# Patient Record
Sex: Female | Born: 1940 | Race: Black or African American | Hispanic: No | Smoking: Never smoker
Health system: Southern US, Community
[De-identification: ages and names within clinical notes are randomized; demographics above are authoritative.]

## PROBLEM LIST (undated history)

## (undated) DIAGNOSIS — R011 Cardiac murmur, unspecified: Secondary | ICD-10-CM

## (undated) DIAGNOSIS — I5032 Chronic diastolic (congestive) heart failure: Secondary | ICD-10-CM

## (undated) DIAGNOSIS — G20A1 Parkinson's disease without dyskinesia, without mention of fluctuations: Secondary | ICD-10-CM

## (undated) DIAGNOSIS — I2510 Atherosclerotic heart disease of native coronary artery without angina pectoris: Secondary | ICD-10-CM

## (undated) DIAGNOSIS — E7800 Pure hypercholesterolemia, unspecified: Secondary | ICD-10-CM

## (undated) DIAGNOSIS — M1990 Unspecified osteoarthritis, unspecified site: Secondary | ICD-10-CM

## (undated) DIAGNOSIS — R7302 Impaired glucose tolerance (oral): Secondary | ICD-10-CM

## (undated) DIAGNOSIS — K2970 Gastritis, unspecified, without bleeding: Secondary | ICD-10-CM

## (undated) DIAGNOSIS — I10 Essential (primary) hypertension: Secondary | ICD-10-CM

## (undated) DIAGNOSIS — E669 Obesity, unspecified: Secondary | ICD-10-CM

## (undated) DIAGNOSIS — G20 Parkinson's disease: Secondary | ICD-10-CM

## (undated) DIAGNOSIS — I639 Cerebral infarction, unspecified: Secondary | ICD-10-CM

## (undated) DIAGNOSIS — I4891 Unspecified atrial fibrillation: Principal | ICD-10-CM

## (undated) DIAGNOSIS — Z90710 Acquired absence of both cervix and uterus: Secondary | ICD-10-CM

## (undated) DIAGNOSIS — F419 Anxiety disorder, unspecified: Secondary | ICD-10-CM

## (undated) HISTORY — PX: EYE SURGERY: SHX253

## (undated) HISTORY — PX: ABDOMINAL HYSTERECTOMY: SHX81

## (undated) HISTORY — DX: Anxiety disorder, unspecified: F41.9

## (undated) HISTORY — PX: TONSILLECTOMY: SHX5217

## (undated) HISTORY — DX: Impaired glucose tolerance (oral): R73.02

## (undated) HISTORY — DX: Chronic diastolic (congestive) heart failure: I50.32

## (undated) HISTORY — PX: CARPAL TUNNEL RELEASE: SHX101

## (undated) HISTORY — DX: Atherosclerotic heart disease of native coronary artery without angina pectoris: I25.10

## (undated) HISTORY — DX: Unspecified osteoarthritis, unspecified site: M19.90

## (undated) HISTORY — DX: Cardiac murmur, unspecified: R01.1

## (undated) HISTORY — DX: Essential (primary) hypertension: I10

## (undated) HISTORY — DX: Gastritis, unspecified, without bleeding: K29.70

## (undated) HISTORY — DX: Obesity, unspecified: E66.9

## (undated) HISTORY — DX: Pure hypercholesterolemia, unspecified: E78.00

## (undated) HISTORY — DX: Acquired absence of both cervix and uterus: Z90.710

## (undated) HISTORY — PX: REPLACEMENT TOTAL KNEE: SUR1224

---

## 2004-11-19 ENCOUNTER — Inpatient Hospital Stay (HOSPITAL_COMMUNITY): Admission: RE | Admit: 2004-11-19 | Discharge: 2004-11-22 | Payer: Self-pay | Admitting: Orthopedic Surgery

## 2004-11-19 ENCOUNTER — Ambulatory Visit: Payer: Self-pay | Admitting: Physical Medicine & Rehabilitation

## 2004-11-22 ENCOUNTER — Inpatient Hospital Stay
Admission: RE | Admit: 2004-11-22 | Discharge: 2004-11-30 | Payer: Self-pay | Admitting: Physical Medicine & Rehabilitation

## 2004-11-22 ENCOUNTER — Ambulatory Visit (HOSPITAL_COMMUNITY)
Admission: RE | Admit: 2004-11-22 | Discharge: 2004-11-22 | Payer: Self-pay | Admitting: Physical Medicine & Rehabilitation

## 2005-02-04 DIAGNOSIS — I639 Cerebral infarction, unspecified: Secondary | ICD-10-CM

## 2005-02-04 HISTORY — DX: Cerebral infarction, unspecified: I63.9

## 2005-02-27 ENCOUNTER — Ambulatory Visit: Payer: Self-pay | Admitting: *Deleted

## 2005-02-27 ENCOUNTER — Inpatient Hospital Stay (HOSPITAL_COMMUNITY): Admission: EM | Admit: 2005-02-27 | Discharge: 2005-03-05 | Payer: Self-pay | Admitting: Emergency Medicine

## 2005-02-28 ENCOUNTER — Encounter: Payer: Self-pay | Admitting: Cardiology

## 2005-03-08 ENCOUNTER — Ambulatory Visit: Payer: Self-pay

## 2005-03-12 ENCOUNTER — Ambulatory Visit: Payer: Self-pay | Admitting: Cardiology

## 2005-03-21 ENCOUNTER — Ambulatory Visit: Payer: Self-pay | Admitting: Cardiology

## 2005-05-03 ENCOUNTER — Ambulatory Visit: Payer: Self-pay | Admitting: Cardiology

## 2005-08-05 ENCOUNTER — Ambulatory Visit: Payer: Self-pay | Admitting: Cardiology

## 2005-08-23 ENCOUNTER — Ambulatory Visit: Payer: Self-pay

## 2005-09-09 ENCOUNTER — Ambulatory Visit: Payer: Self-pay | Admitting: Cardiology

## 2005-09-17 ENCOUNTER — Ambulatory Visit: Payer: Self-pay | Admitting: Cardiology

## 2005-10-10 ENCOUNTER — Ambulatory Visit: Payer: Self-pay | Admitting: Cardiology

## 2005-11-12 ENCOUNTER — Ambulatory Visit: Payer: Self-pay | Admitting: Cardiology

## 2005-11-13 ENCOUNTER — Ambulatory Visit: Payer: Self-pay

## 2005-11-18 ENCOUNTER — Ambulatory Visit: Payer: Self-pay | Admitting: Cardiology

## 2005-12-06 ENCOUNTER — Ambulatory Visit: Payer: Self-pay | Admitting: Cardiology

## 2006-01-16 ENCOUNTER — Ambulatory Visit: Payer: Self-pay | Admitting: Cardiology

## 2006-03-18 ENCOUNTER — Ambulatory Visit: Payer: Self-pay | Admitting: Cardiology

## 2006-03-18 LAB — CONVERTED CEMR LAB
ALT: 20 units/L (ref 0–40)
AST: 30 units/L (ref 0–37)
Albumin: 4.1 g/dL (ref 3.5–5.2)
Alkaline Phosphatase: 81 units/L (ref 39–117)
BUN: 22 mg/dL (ref 6–23)
CO2: 28 meq/L (ref 19–32)
Calcium: 9.8 mg/dL (ref 8.4–10.5)
Chloride: 102 meq/L (ref 96–112)
Cholesterol: 220 mg/dL (ref 0–200)
Creatinine, Ser: 1.1 mg/dL (ref 0.4–1.2)
Direct LDL: 90.9 mg/dL
GFR calc Af Amer: 64 mL/min
GFR calc non Af Amer: 52 mL/min
Glucose, Bld: 116 mg/dL — ABNORMAL HIGH (ref 70–99)
HDL: 41.7 mg/dL (ref >39.0)
Potassium: 4.4 meq/L (ref 3.5–5.1)
Sodium: 142 meq/L (ref 135–145)
Total Bilirubin: 1.1 mg/dL (ref 0.3–1.2)
Total CHOL/HDL Ratio: 5.3
Total Protein: 7.4 g/dL (ref 6.0–8.3)
Triglycerides: 313 mg/dL (ref 0–149)
VLDL: 63 mg/dL — ABNORMAL HIGH (ref 0–40)

## 2006-04-24 ENCOUNTER — Encounter: Admission: RE | Admit: 2006-04-24 | Discharge: 2006-04-24 | Payer: Self-pay | Admitting: Internal Medicine

## 2006-05-20 ENCOUNTER — Ambulatory Visit: Payer: Self-pay | Admitting: Cardiology

## 2006-06-02 ENCOUNTER — Encounter: Admission: RE | Admit: 2006-06-02 | Discharge: 2006-06-02 | Payer: Self-pay | Admitting: Internal Medicine

## 2006-06-06 ENCOUNTER — Ambulatory Visit (HOSPITAL_COMMUNITY): Admission: RE | Admit: 2006-06-06 | Discharge: 2006-06-06 | Payer: Self-pay | Admitting: Gastroenterology

## 2006-06-06 ENCOUNTER — Encounter (INDEPENDENT_AMBULATORY_CARE_PROVIDER_SITE_OTHER): Payer: Self-pay | Admitting: Specialist

## 2006-10-17 ENCOUNTER — Ambulatory Visit: Payer: Self-pay | Admitting: Cardiology

## 2006-11-27 ENCOUNTER — Encounter: Admission: RE | Admit: 2006-11-27 | Discharge: 2006-11-27 | Payer: Self-pay | Admitting: Internal Medicine

## 2007-05-05 ENCOUNTER — Ambulatory Visit: Payer: Self-pay | Admitting: Cardiology

## 2007-06-04 ENCOUNTER — Encounter: Admission: RE | Admit: 2007-06-04 | Discharge: 2007-06-04 | Payer: Self-pay | Admitting: Internal Medicine

## 2007-09-10 ENCOUNTER — Ambulatory Visit: Payer: Self-pay | Admitting: Cardiology

## 2008-06-27 ENCOUNTER — Encounter: Admission: RE | Admit: 2008-06-27 | Discharge: 2008-06-27 | Payer: Self-pay | Admitting: Internal Medicine

## 2008-10-15 DIAGNOSIS — I10 Essential (primary) hypertension: Secondary | ICD-10-CM | POA: Insufficient documentation

## 2008-10-15 DIAGNOSIS — I2510 Atherosclerotic heart disease of native coronary artery without angina pectoris: Secondary | ICD-10-CM | POA: Insufficient documentation

## 2008-10-15 DIAGNOSIS — E669 Obesity, unspecified: Secondary | ICD-10-CM | POA: Insufficient documentation

## 2008-10-15 DIAGNOSIS — E7800 Pure hypercholesterolemia, unspecified: Secondary | ICD-10-CM | POA: Insufficient documentation

## 2008-10-15 DIAGNOSIS — E739 Lactose intolerance, unspecified: Secondary | ICD-10-CM | POA: Insufficient documentation

## 2008-10-15 DIAGNOSIS — M1990 Unspecified osteoarthritis, unspecified site: Secondary | ICD-10-CM | POA: Insufficient documentation

## 2008-10-15 DIAGNOSIS — F411 Generalized anxiety disorder: Secondary | ICD-10-CM | POA: Insufficient documentation

## 2008-10-15 DIAGNOSIS — K2970 Gastritis, unspecified, without bleeding: Secondary | ICD-10-CM | POA: Insufficient documentation

## 2008-10-15 DIAGNOSIS — K2990 Gastroduodenitis, unspecified, without bleeding: Secondary | ICD-10-CM

## 2008-11-02 ENCOUNTER — Ambulatory Visit: Payer: Self-pay | Admitting: Cardiology

## 2008-11-02 DIAGNOSIS — R609 Edema, unspecified: Secondary | ICD-10-CM | POA: Insufficient documentation

## 2008-11-15 ENCOUNTER — Encounter: Payer: Self-pay | Admitting: Cardiology

## 2008-11-15 ENCOUNTER — Ambulatory Visit: Payer: Self-pay | Admitting: Cardiology

## 2008-11-15 ENCOUNTER — Ambulatory Visit: Payer: Self-pay

## 2008-11-15 ENCOUNTER — Ambulatory Visit (HOSPITAL_COMMUNITY): Admission: RE | Admit: 2008-11-15 | Discharge: 2008-11-15 | Payer: Self-pay | Admitting: Internal Medicine

## 2009-04-11 ENCOUNTER — Encounter: Admission: RE | Admit: 2009-04-11 | Discharge: 2009-04-13 | Payer: Self-pay | Admitting: Internal Medicine

## 2009-11-14 ENCOUNTER — Emergency Department (HOSPITAL_COMMUNITY): Admission: EM | Admit: 2009-11-14 | Discharge: 2009-11-14 | Payer: Self-pay | Admitting: Internal Medicine

## 2009-11-22 ENCOUNTER — Ambulatory Visit: Payer: Self-pay | Admitting: Cardiology

## 2010-02-25 ENCOUNTER — Encounter: Payer: Self-pay | Admitting: Internal Medicine

## 2010-03-08 NOTE — Assessment & Plan Note (Signed)
Summary: f1y   Visit Type:  1 year follow up Primary Provider:  Andi Devon  CC:  No cardiac complaints- Unable to weight patient.  History of Present Illness: Overall patient is stable.  No chest pain.  She is falling alot.  She lives with her son, who stays home.  Was placed on Aricept by neuro.    Current Medications (verified): 1)  Aspirin 81 Mg Tbec (Aspirin) .... Take One Tablet By Mouth Daily 2)  Amlodipine Besy-Benazepril Hcl 5-20 Mg Caps (Amlodipine Besy-Benazepril Hcl) .... Take 1 Capsule By Mouth Once A Day 3)  Simvastatin 40 Mg Tabs (Simvastatin) .... Take One Tablet By Mouth Daily At Bedtime 4)  Carbidopa-Levodopa 25-100 Mg Tabs (Carbidopa-Levodopa) .... Take 3 Tablets Three Times A Day 5)  Detrol La 4 Mg Xr24h-Cap (Tolterodine Tartrate) .... Take 1 Capsule By Mouth Once A Day 6)  Aricept 5 Mg Tabs (Donepezil Hcl) .... Take 1 Tablet By Mouth Two Times A Day 7)  Metoprolol Tartrate 50 Mg Tabs (Metoprolol Tartrate) .... Take 1/2 Tablet By Mouth Twice A Day 8)  Oxycodone-Acetaminophen 5-325 Mg Tabs (Oxycodone-Acetaminophen) .... As Needed 9)  Vitamin D3 2000 Unit Caps (Cholecalciferol) .... Take 1 Capsule By Mouth Once A Day  Allergies: 1)  ! Sulfa 2)  ! * Epsom Salt  Vital Signs:  Patient profile:   70 year old female Height:      64 inches Pulse rate:   72 / minute Pulse rhythm:   regular Resp:     18 per minute BP sitting:   140 / 80  (right arm) Cuff size:   large  Vitals Entered By: Vikki Ports (November 22, 2009 12:28 PM)  Physical Exam  General:  Well developed, well nourished, in no acute distress.  IN wheel chair Head:  normocephalic and atraumatic Eyes:  PERRLA/EOM intact; conjunctiva and lids normal. Lungs:  Clear bilaterally to auscultation and percussion. Heart:  Non-displaced PMI, chest non-tender; regular rate and rhythm, S1, S2 without murmurs, rubs or gallops. Pulses:  pulses normal in all 4 extremities Extremities:  No clubbing or  cyanosis. Neurologic:  Answers questions.  Seems alert, perhaps a bit slow   EKG  Procedure date:  11/22/2009  Findings:      NSR.  WNL  Impression & Recommendations:  Problem # 1:  CAD (ICD-414.00)  Has congenital abnormality, but no symptoms and not active.  Stable cardiac status.  Continue medication. Her updated medication list for this problem includes:    Aspirin 81 Mg Tbec (Aspirin) .Marland Kitchen... Take one tablet by mouth daily    Amlodipine Besy-benazepril Hcl 5-20 Mg Caps (Amlodipine besy-benazepril hcl) .Marland Kitchen... Take 1 capsule by mouth once a day    Metoprolol Tartrate 50 Mg Tabs (Metoprolol tartrate) .Marland Kitchen... Take 1/2 tablet by mouth twice a day  Orders: EKG w/ Interpretation (93000)  Problem # 2:  HYPERTENSION, UNSPECIFIED (ICD-401.9)  controlled Her updated medication list for this problem includes:    Aspirin 81 Mg Tbec (Aspirin) .Marland Kitchen... Take one tablet by mouth daily    Amlodipine Besy-benazepril Hcl 5-20 Mg Caps (Amlodipine besy-benazepril hcl) .Marland Kitchen... Take 1 capsule by mouth once a day    Metoprolol Tartrate 50 Mg Tabs (Metoprolol tartrate) .Marland Kitchen... Take 1/2 tablet by mouth twice a day  Her updated medication list for this problem includes:    Aspirin 81 Mg Tbec (Aspirin) .Marland Kitchen... Take one tablet by mouth daily    Amlodipine Besy-benazepril Hcl 5-20 Mg Caps (Amlodipine besy-benazepril hcl) .Marland Kitchen... Take 1 capsule  by mouth once a day    Metoprolol Tartrate 50 Mg Tabs (Metoprolol tartrate) .Marland Kitchen... Take 1/2 tablet by mouth twice a day  Problem # 3:  HYPERCHOLESTEROLEMIA (ICD-272.0) Will switch meds due to simva/amlodipine interaction.  Continue medication.   Follow up with Dr. Renae Gloss for labs.  Her updated medication list for this problem includes:    Pravastatin Sodium 40 Mg Tabs (Pravastatin sodium) .Marland Kitchen... Take one tablet by mouth daily at bedtime  Patient Instructions: 1)  Your physician recommends that you have  a FASTING lipid  and liver profile in 6 WEEKS with Dr Renae Gloss (PCP).  2)   Your physician has recommended you make the following change in your medication: STOP Simvastatin, START Pravastatin 40mg  one by mouth at bedtime  3)  Your physician wants you to follow-up in:  1 YEAR.  You will receive a reminder letter in the mail two months in advance. If you don't receive a letter, please call our office to schedule the follow-up appointment. Prescriptions: PRAVASTATIN SODIUM 40 MG TABS (PRAVASTATIN SODIUM) Take one tablet by mouth daily at bedtime  #30 x 6   Entered by:   Julieta Gutting, RN, BSN   Authorized by:   Ronaldo Miyamoto, MD, [facility]   Signed by:   Julieta Gutting, RN, BSN on 11/22/2009   Method used:   [facility] to        [facility]  [address] [id number]       [address]       [place], [place]  [address]       Ph: [phone] or [phone]       Fax: [phone]   RxID:   [id number]

## 2010-03-08 NOTE — Assessment & Plan Note (Signed)
Summary: f1y  Medications Added AMLODIPINE BESY-BENAZEPRIL HCL 5-20 MG CAPS (AMLODIPINE BESY-BENAZEPRIL HCL) Take 1 capsule by mouth once a day SIMVASTATIN 40 MG TABS (SIMVASTATIN) Take one tablet by mouth daily at bedtime- RAN OUT 2 weeks ago SIMVASTATIN 40 MG TABS (SIMVASTATIN) Take one tablet by mouth daily at bedtime CARBIDOPA-LEVODOPA 25-100 MG TABS (CARBIDOPA-LEVODOPA) take 3 tablets three times a day DETROL 1 MG TABS (TOLTERODINE TARTRATE) Take 1 tablet by mouth once a day ARICEPT 5 MG TABS (DONEPEZIL HCL) Take 1 tablet by mouth two times a day METOPROLOL TARTRATE 50 MG TABS (METOPROLOL TARTRATE) Take 1/2 tablet by mouth twice a day        Visit Type:  1 year follow up  CC:  Bilateral leg edema.  History of Present Illness: Overall doing well.  Frequently forgets to take medications, and at times, just forgets.  Does not like to go the bathroom to much.  No chest pain at all.  Current Medications (verified): 1)  Aspirin 81 Mg Tbec (Aspirin) .... Take One Tablet By Mouth Daily 2)  Amlodipine Besy-Benazepril Hcl 5-20 Mg Caps (Amlodipine Besy-Benazepril Hcl) .... Take 1 Capsule By Mouth Once A Day 3)  Simvastatin 40 Mg Tabs (Simvastatin) .... Take One Tablet By Mouth Daily At Bedtime- Ran Out 2 Weeks Ago 4)  Carbidopa-Levodopa 25-100 Mg Tabs (Carbidopa-Levodopa) .... Take 3 Tablets Three Times A Day 5)  Detrol 1 Mg Tabs (Tolterodine Tartrate) .... Take 1 Tablet By Mouth Once A Day 6)  Aricept 5 Mg Tabs (Donepezil Hcl) .... Take 1 Tablet By Mouth Two Times A Day 7)  Metoprolol Tartrate 50 Mg Tabs (Metoprolol Tartrate) .... Take 1/2 Tablet By Mouth Twice A Day  Allergies: 1)  ! Sulfa 2)  ! * Epsom Salt  Vital Signs:  Patient profile:   70 year old female Height:      64 inches Weight:      198 pounds BMI:     34.11 Pulse rate:   107 / minute Pulse rhythm:   irregular Resp:     18 per minute BP sitting:   132 / 84  (left arm) Cuff size:   large  Vitals Entered By: Vikki Ports (November 02, 2008 10:29 AM)  Physical Exam  General:  Well developed, well nourished, in no acute distress. Neck:  No JVD Lungs:  Clear bilaterally to auscultation and percussion. Heart:  Cor reg without murmur. Normal S1 and S2.  No def murmur. Abdomen:  Bowel sounds positive; abdomen soft and non-tender without masses, organomegaly, or hernias noted. No hepatosplenomegaly. Extremities:  No clubbing or cyanosis.   2 plus edema bilat.   Impression & Recommendations:  Problem # 1:  CAD (ICD-414.00) Has anomolous origin of coronaries.  No current symptoms.  HR fast, but has been forgetting to take metoprolol.  Reviewed i ndetail with patient today.  Discussed medication distribution options with daughter in room.  Suggest medication dispenser based on days. The following medications were removed from the medication list:    Lotrel 5-20 Mg Caps (Amlodipine besy-benazepril hcl) .Marland Kitchen... Take 1 capsule by mouth once a day Her updated medication list for this problem includes:    Aspirin 81 Mg Tbec (Aspirin) .Marland Kitchen... Take one tablet by mouth daily    Amlodipine Besy-benazepril Hcl 5-20 Mg Caps (Amlodipine besy-benazepril hcl) .Marland Kitchen... Take 1 capsule by mouth once a day    Metoprolol Tartrate 50 Mg Tabs (Metoprolol tartrate) .Marland Kitchen... Take 1/2 tablet by mouth twice a day  Orders:  Echocardiogram (Echo)  Problem # 2:  HYPERTENSION, UNSPECIFIED (ICD-401.9) REviewed The following medications were removed from the medication list:    Triamterene-hctz 37.5-25 Mg Tabs (Triamterene-hctz) .Marland Kitchen... Take 1 tablet by mouth once a day    Lotrel 5-20 Mg Caps (Amlodipine besy-benazepril hcl) .Marland Kitchen... Take 1 capsule by mouth once a day Her updated medication list for this problem includes:    Aspirin 81 Mg Tbec (Aspirin) .Marland Kitchen... Take one tablet by mouth daily    Amlodipine Besy-benazepril Hcl 5-20 Mg Caps (Amlodipine besy-benazepril hcl) .Marland Kitchen... Take 1 capsule by mouth once a day    Metoprolol Tartrate 50 Mg Tabs  (Metoprolol tartrate) .Marland Kitchen... Take 1/2 tablet by mouth twice a day  Orders: EKG w/ Interpretation (93000) Echocardiogram (Echo)  Problem # 3:  HYPERCHOLESTEROLEMIA (ICD-272.0) Says labs checked by Dr. Renae Gloss. The following medications were removed from the medication list:    Simvastatin 40 Mg Tabs (Simvastatin) .Marland Kitchen... Take one tablet by mouth daily at bedtime    Trilipix 135 Mg Cpdr (Choline fenofibrate) .Marland Kitchen... Take 1 capsule by mouth once a day Her updated medication list for this problem includes:    Simvastatin 40 Mg Tabs (Simvastatin) .Marland Kitchen... Take one tablet by mouth daily at bedtime  Problem # 4:  EDEMA (ICD-782.3) Will check 2D.  Previous function OK. but diastolic abnormality.  Check 2D echo.  Reinforced medications.  Patient Instructions: 1)  Your physician recommends that you continue on your current medications as directed. Please refer to the Current Medication list given to you today. 2)  Your physician wants you to follow-up in:   1 YEAR. You will receive a reminder letter in the mail two months in advance. If you don't receive a letter, please call our office to schedule the follow-up appointment. 3)  Please remember to take your medications as directed. 4)  Your physician has requested that you have an echocardiogram.  Echocardiography is a painless test that uses sound waves to create images of your heart. It provides your doctor with information about the size and shape of your heart and how well your heart's chambers and valves are working.  This procedure takes approximately one hour. There are no restrictions for this procedure. Prescriptions: SIMVASTATIN 40 MG TABS (SIMVASTATIN) Take one tablet by mouth daily at bedtime  #30 x 12   Entered by:   Julieta Gutting, RN, BSN   Authorized by:   Ronaldo Miyamoto, MD, [facility]   Signed by:   Julieta Gutting, RN, BSN on 11/02/2008   Method used:   [facility]  [address] [id number]       [address]        [place], [place]  [address]       Ph: [phone] or [phone]       Fax: [phone]   RxID:   [id number]

## 2010-06-02 ENCOUNTER — Other Ambulatory Visit: Payer: Self-pay | Admitting: Cardiology

## 2010-06-02 DIAGNOSIS — I10 Essential (primary) hypertension: Secondary | ICD-10-CM

## 2010-06-02 DIAGNOSIS — I2510 Atherosclerotic heart disease of native coronary artery without angina pectoris: Secondary | ICD-10-CM

## 2010-06-19 NOTE — Assessment & Plan Note (Signed)
[facility]                            CARDIOLOGY OFFICE NOTE   NAME:Zumstein, ZURIE PLATAS                     MRN:          [id number]  DATE:05/05/2007                            DOB:          12-May-1940    [name] is in for followup.  She has been stable.  She is in a  wheelchair and says that she has had trouble with her knee since the  surgery.  She is mildly tachycardic today, and she says she is out of  her metoprolol; although she said she took it as recently as this  morning.   MEDICATIONS INCLUDE:  1. Triamterene HCTZ 37/25.  2. Lotrel 5/20.  3. Aspirin 81 mg daily.  4. Simvastatin 40 mg daily.  5. Metoprolol 50 mg 1-1/2 tablets p.o. b.i.d.   PHYSICAL EXAMINATION:  On physical she is alert and oriented.  Her blood pressure is 137/73.  The pulse is 118.  The lung fields are clear.  The cardiac exam is basically unchanged.  The extremities reveal trace edema bilaterally.  There is not one-side  that is larger than the other.   ELECTROCARDIOGRAM:  Demonstrates sinus tachycardia with left anterior  fascicular block.   We have given her prescription for her metoprolol.  I have instructed  her to go home and take it promptly.  She has not been tachycardic in  the past, and it makes me wonder whether she really has taken her  metoprolol or ran out of it earlier.  There seems to be a degree of  confusion about her medication, today.  She is scheduled to followup  with Dr. Renae Gloss in the near future, and the patient did assure me that  she had had some laboratory studies done recently.  I will see her back  in followup, shortly.     Arturo Morton. Riley Kill, MD, [facility]  Electronically Signed    TDS/MedQ  DD: 05/06/2007  DT: 05/06/2007  Job #: [id number]   cc:   Merlene Laughter. Renae Gloss, M.D.

## 2010-06-19 NOTE — Assessment & Plan Note (Signed)
[facility]                            CARDIOLOGY OFFICE NOTE   NAME:[name], [name] [name]                     MRN:          [id number]  DATE:10/17/2006                            DOB:          06/04/1940    [name] is in for a follow up visit. In general, she has been stable.  Back a couple of months ago, she had an episode of some chest discomfort  with some dizziness, but other than this, this is generally resolved.  She denies any ongoing chest pain at the present time.   CURRENT MEDICATIONS:  1. Klor-Con 10 mEq daily.  2. Triamterene hydrochlorothiazide 37.5/25 daily.  3. Lotrel 5/20 mg daily.  4. Aspirin 81 mg daily.  5. Metoprolol 50 mg 1/2 b.i.d.  6. Simvastatin 40 mg nightly.  7. Meclizine 25 mg p.r.n.   PHYSICAL EXAMINATION:  GENERAL:  She is alert and oriented.  VITAL SIGNS:  Weight 215 pounds, blood pressure 130/80, pulse 89.  LUNGS:  Clear to auscultation and percussion.  CARDIAC:  Rhythm is regular and there is not a significant murmur.  EXTREMITIES:  There is no extremity edema.   The electrocardiogram demonstrates normal sinus rhythm.   Her last lipid profile reveals an LDL of 90.   IMPRESSION:  1. Coronary anomaly with left system rising from the right and      medically treated disease.  2. Well preserved left ventricular function.  3. Hypertension under good control.  4. Hypercholesterolemia.   RECOMMENDATIONS:  1. Continue current medical regimen.  2. She will remain on Simvastatin.  3. Repeat lipids will be necessitated and follow up should be obtained      in the next 4 to 6 months.     Arturo Morton. Riley Kill, MD, [facility]  Electronically Signed    TDS/MedQ  DD: 11/16/2006  DT: 11/16/2006  Job #: [id number]

## 2010-06-19 NOTE — Letter (Signed)
September 10, 2007    [name] [name] Gloss, MD  [address]  [address]  [place], [place] [address].   RE:  [name], [name]  MRN:  [id number]  /  DOB:  [birth year]   Dear [name],   I had the pleasure of seeing [name] in the office today in  followup.  She tells me that she has been seeing Neurology for question  of a possible stroke.  In fact, she may have had these symptoms at some  point in the past.  She has had no recent acute symptoms, and she denies  any ongoing chest pain.   MEDICATIONS INCLUDE:  1. Triamterene/hydrochlorothiazide 37/25 daily.  2. Lotrel 5/20 daily.  3. Aspirin 81 mg daily.  4. Simvastatin 40 mg nightly.  5. Metoprolol 50 mg 1-1/2 tablet b.i.d.  6. Triplix 135 mg daily.   She tells me that you have been checking her laboratory studies closely.   On physical, she is alert and oriented.  She is sitting in a wheelchair.  The weight is 216 pounds, blood pressure 138/80, and the pulse is 71.  The lung fields are clear.  The cardiac rhythm is regular.   The electrocardiogram demonstrates normal sinus rhythm essentially  within normal limits.   As you know, she does have coronary artery disease that appears to be  well controlled on her medical regimen.  She is not having increasing  symptoms.  My recommendations would be to  carefully manage her hypertension and her hypercholesterolemia.  I think  that evaluation by neurologist is clearly helpful and I have asked her  to have them send me information regarding what is transpired.  She does  not have any carotid bruits on examination.  I will be interested to see  what the findings are.  I appreciate the opportunity of sharing in her  care.    Sincerely,      [name] Riley Kill, MD, [facility]  Electronically Signed    TDS/MedQ  DD: 09/10/2007  DT: 09/11/2007  Job #: [id number]
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
## 2010-06-22 NOTE — Discharge Summary (Signed)
NAMEBRETTNEY, FICKEN              ACCOUNT NO.:  [id number]   MEDICAL RECORD NO.:  [id number]          PATIENT TYPE:  INP   LOCATION:  1503                         FACILITY:  [facility]   PHYSICIAN:  Madlyn Frankel. Charlann Boxer, M.D.  DATE OF BIRTH:  09/25/1940   DATE OF ADMISSION:  11/19/2004  DATE OF DISCHARGE:  11/22/2004                                 DISCHARGE SUMMARY   ADMISSION DIAGNOSES:  1.  Right knee end-stage osteoarthritis.  2.  Hypertension.  3.  A history of heart murmur.  4.  Gastritis.  5.  A history of blurred vision.  6.  A history of osteoarthritis.  7.  Anxiety.  8.  Hypercholesterolemia.   DISCHARGE DIAGNOSES:  1.  Right knee end-stage osteoarthritis.  2.  Hypertension.  3.  A history of heart murmur.  4.  Gastritis.  5.  A history of blurred vision.  6.  A history of osteoarthritis.  7.  Anxiety.  8.  Hypercholesterolemia.  9.  Mild postoperative anemia.   OPERATIONS:  On November 19, 2004, the patient underwent right knee total  knee replacement arthroplasty utilizing DuPuy rotating platform posterior  stabilizing knee system, [facility], [facility] assistant.   BRIEF HISTORY:  This 70 year old lady is seen by Dr. Charlann Boxer for continuing  problems concerning pain into her right knee.  The lateral and  patellofemoral compartments were most infected.  Injections into the knee  and other conservative measures had not helped her overall discomfort.  She  is a relatively young lady, and she is having difficulty getting about, and  after the risks and benefits of surgery and also in light of the fact of the  progressive valgus deformity and stiffness of the knee, it was felt she  would benefit from surgical intervention, and she chose to go ahead with  total knee replacement arthroplasty of the right knee.   COURSE IN THE HOSPITAL:  The patient tolerated the surgical procedure quite  well.  She was placed on total knee protocol postoperatively  for prevention  of DVT.  The  HemoVac was DC'd without difficulty postoperatively , and the  Foley was removed as well.  We weaned her from the PCA to p.o. medication  for discomfort.  She began with the program quite eagerly.  The dressing was  changed on the second postop day.  There was no evidence of any drainage.  It was felt she would benefit from an intensive inpatient rehabilitation  program, and we concentrated our efforts towards that goal.  Once a bed was  available in the rehabilitation and subacute care unit at [facility], the  patient was transferred there.   LABORATORY:  Laboratory values in the hospital hematologically showed a CBC  with differential, showing an elevated white count preoperatively at 12.9.  Hemoglobin was 14.1, hematocrit 42.6.  Final WBC was 15.4 with no shift.  Hemoglobin was 11.3, hematocrit of 34.  Blood chemistries remained normal  throughout her hospitalization.  The urinalysis showed proteinuria with a  few epithelial cells and calcium oxalate crystals.  No chest x-ray or  electrocardiogram is  seen in this chart.   CONDITION ON DISCHARGE:  Improved, stable.   PLAN:  The patient is transferred to subacute care unit, to continue with  her inpatient rehab program.  We will recommend a repeat urinalysis there.  She may need a nephrology workup in the near future.  The patient had been  placed on Coumadin protocol postoperatively  for the prevention of DVT and  will continue with that at least 4 weeks after the date of surgery.  She can  also weightbear as tolerated.      Dooley L. Cherlynn June.      Madlyn Frankel Charlann Boxer, M.D.  Electronically Signed    DLU/MEDQ  D:  11/26/2004  T:  11/26/2004  Job:  [id number]

## 2010-06-22 NOTE — Op Note (Signed)
[name], [name]              ACCOUNT NO.:  [id number]   MEDICAL RECORD NO.:  [id number]          PATIENT TYPE:  INP   LOCATION:  0011                         [facility]AC[facility]   PHYSICIAN:  Madlyn Frankel. Charlann Boxer, M.D.  DATE OF BIRTH:  1940-08-02   DATE OF PROCEDURE:  11/19/2004  DATE OF DISCHARGE:                                 OPERATIVE REPORT   PREOPERATIVE DIAGNOSIS:  Right knee osteoarthritis.   POSTOPERATIVE DIAGNOSIS:  Right knee osteoarthritis.   PROCEDURE:  Right total knee replacement.   COMPONENTS USED:  A DePuy rotating platform posterior stabilized knee system  with size 2.5 femur, size 2 tibia, 10 mm posterior stabilized polyethylene  insert, size 35 patellar button.   SURGEON:  Madlyn Frankel. Charlann Boxer, M.D.   ASSISTANTDruscilla Brownie. Idolina Primer, P.A.   BLOOD LOSS:  100 cc.   TOURNIQUET TIME:  Seventy-four minutes at 300 mmHg.   DRAINS:  One.   COMPLICATIONS:  None.   INDICATIONS FOR PROCEDURE:  Ms. Zalenski is a very pleasant 70 year old female  who had been followed in the office by right knee osteoarthritis, primarily  in the lateral patellofemoral compartments.  She has failed all conservative  measures, including injections.  She wished not to proceed with any further  conservative care and wished to proceed with total hip replacement.  The  risks and benefits were reviewed.  She is noted to have a valgus deformity  with minimal flexion of the knee preoperatively.  Consent was obtained based  on the discussion, evaluating the risks of DVT, component failure,  infection, and perioperative comorbidities.   PROCEDURE IN DETAIL:  The patient was brought to the operating theater.  Once adequate anesthesia and preoperative antibiotics  of 1 gm of Ancef were  administered, the patient was positioned supine with the proximal thigh  tourniquet placed on the right thigh.  The right lower extremity was then  prepped and draped in a sterile fashion following initial Betadine  prescrub.  A midline incision was made, followed by a medial parapatellar arthrotomy.  Then knee exposure was obtained in a routine fashion.  The patient was noted  to have pretty extensive osteophyte formation, both in the anterior, medial,  and lateral aspiration of the knee as well as over the lateral femur with  significant osteophyte formation present tricompartmentally.  A drill was  passed into the femur, followed by placement of an intramedullary guide.  Then 10 mm was resected off of the distal femur and 5 degrees of valgus.  The lateral distal femur was not so hypoplastic so as not to resect bone.  There was resected distal femur from the lateral side.  The femur was then  sized initially to a size 3.  This was based off the lateral femoral  trochlea.  Based off the posterior condylar direction, the patient's  external rotation matched Whiteside's line and was even with the epicondylar  axis.  Based on this, the 3 cutting block was a 10.  The anterior,  posterior, and chamfer cuts were then made.  A box cut was then made based  off  the lateral aspect of the medial femoral condyle.  At this point,  attention was directed to the tibia.  Tibial exposure was obtained in a  routine fashion following meniscectomies.  An extra-medullary guide was  utilized.  I resected 8 mm off of the proximal medial tibia at the  unaffected side.  Removed osteophytes circumferentially around the tibia as  well as removing the remaining cartilaginous tissue.  At this point, a trial  reduction was carried out initially with a 3 femur and a 2.5 tibia.  The  patient's knee came out to full extension.  The patient was stable on  flexion and extension.  There was some tightness in flexion appreciated.   At this point, attention was directed to final preparation of the tibia.  Following exposure of the tibia, I realized that a size 2 tibia would work  better than a 2.5.  I also realized there was some  over-hang on the lateral  aspect of the femur with the size 3 component.  I made this incision.  It  was planned to recut the femur in a size 2.5 block.  This was carried out  without complications.  No notching.  Following this, attention was directed  to the posterior aspect of the femur where posterior osteophytes were  removed as well as a large loose body contained within the soft tissue of  the posterior aspect of the knee.  At this point, final keel cuts of the  tibia were carried out matching the patient's anatomy for the rotating  platform knee system.  I did repeat a trial reduction and was very happy  with the size 10 poly.  The knee came to full extension.  It was stable on  extension and was stable on flexion.   When the trial components were positioned, attention was directed to the  patella.  The patella was everted.  Precut measurement was 22 mm.  I  resected down to 15 mm.  I then sized it to be a 35 patella button, and  drill holes were placed.  The patella tracked without application of thumb  pressure, indicating good rotation and position of the components.  At this  point, final components were brought into the fields.  The knee was  copiously irrigated with pulsatile lavage irrigation.  Excessive fat tissue  and other debridement carried out as necessary at this point.  Once the  cement was ready, the components were cemented into position with the tibia  first and then the femur with a trial spacer placed and then the patella.  Once the cement had cured, excessive cement was removed.  I then reexposed  the knee and made sure there was no extra cement in the posterior aspect of  the knee and removed this.  A final 10 mm posterior stabilized cruciate was  sacrificed, and rotating polyethylene liner was then positioned into the  tibia, and the knee reduced.  I did inject the knee with a combination of 60 cc of Marcaine with epinephrine and Toradol.  A medium Hemovac  drain was  placed.  The knee was then closed in flexion with the #1 PDS.  The remainder  of the wound was closed in layers with 2-0 Vicryl and staples on the skin.  The knee was then cleaned, dried, and dressed sterilely with Adaptic  dressing, sponges, and a bulky Jones dressing.  She was transferred to the  recovery room in stable condition.  Madlyn Frankel Charlann Boxer, M.D.  Electronically Signed     MDO/MEDQ  D:  11/19/2004  T:  11/19/2004  Job:  [id number]

## 2010-06-22 NOTE — Assessment & Plan Note (Signed)
[facility]                              CARDIOLOGY OFFICE NOTE   NAME:Mcmurphy, JAHDAI PADOVANO             MRN:          [id number]  DATE:09/09/2005                            DOB:          1940/10/18    [name] is in for a followup visit.  She continues to have some swelling  in the lower extremities.  Venous Dopplers were essentially negative.  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  Her echocardiogram was fairly unremarkable and myocardial perfusion study  did not suggest significant ischemia.  The edema in the lower extremities  has been at most mild.  She is also not completely sure of all of her  medicines.   MEDICATIONS:  1.  Enteric coated aspirin 81 mg daily.  2.  K-Dur 10 mEq daily.  3.  Metoprolol 50 mg 1/2 tablet b.i.d.  4.  Lotrel 5/20.   It is not clear that she is taking a diuretic pill.  She was on a steroid  dose from Dr. Charlann Boxer, but that has subsequently been discontinued.   PHYSICAL EXAMINATION:  VITAL SIGNS:  Blood pressure 134/80, pulse 90.  LUNGS:  Lung fields are clear.  CARDIAC:  Rhythm is regular.   ASSESSMENT/PLAN:  We plan to have her return in about 4 weeks.  I need to  have her bring her medications in.  We will try to get a basic metabolic  profile fasting, as well as lipid profile.  She was hypercholesterolemic and  given the underlying disease, I think she probably should be treated.  She  did not tolerate Lipitor very well.  I also think we could potentially get  her off of Lotrel and on to either an ACE inhibitor and/or an ARB solely and  this might alleviate the lower extremity swelling to some extent.  We will  see her back in followup in about 4 weeks.                              Arturo Morton. Riley Kill, MD, [facility]    TDS/MedQ  DD:  09/09/2005  DT:  09/10/2005  Job #:  [id number]   cc:   Lorelle Formosa, MD
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
## 2010-06-22 NOTE — Discharge Summary (Signed)
NAMETARRI, GUILFOIL NO.:  [id number]   MEDICAL RECORD NO.:  [id number]          PATIENT TYPE:  INP   LOCATION:  4705                         FACILITY:  [facility]   PHYSICIAN:  Lonia Blood, M.D.       DATE OF BIRTH:  [birth year]   DATE OF ADMISSION:  02/27/2005  DATE OF DISCHARGE:  03/05/2005                                 DISCHARGE SUMMARY   The patient's primary care physician is Lorelle Formosa, M.D.   DISCHARGE DIAGNOSES:  1.  Chest pain, workup in progress for possible coronary artery disease.      Had cardiac catheterization on March 01, 2005, showing nonobstructive      coronary artery disease.  2.  Severe hyperlipidemia.  3.  Hypertension.  4.  Impaired glucose tolerance.  5.  Obesity.  6.  Lower extremity edema with negative venous Dopplers.  7.  Status post right total knee replacement October 2006.  8.  Anxiety disorder.  9.  Status post hysterectomy.  10. Status post tonsillectomy.  11. History of gastritis.   DISCHARGE MEDICATIONS:  1.  Lipitor 80 mg by mouth at bedtime.  2.  Metoprolol 25 mg by mouth twice a day.  3.  Aspirin 325 mg by mouth daily.   CONSULTATIONS:  During this admission we consulted the [facility] Cardiology  group, Dr. Riley Kill.   PROCEDURES DURING THIS ADMISSION:  1.  The patient had a cardiac catheterization on March 01, 2005, showing a      60% lesion on the LAD and possible abnormal coronary anatomy with acute      ejection fraction about 70% and good left ventricular motion.  2.  The patient had a cardiac CT on March 04, 2005.  Results not available      at time of dictation.  3.  On February 28, 2005, the patient had lower extremity Dopplers that were      negative for deep venous thrombosis.   CONDITION AT TIME OF DISCHARGE:  The patient was discharged in good  condition.  At the time of discharge she did not have any more chest pain.  She was instructed to follow up with [facility] on March 08, 2005,  for a stress test Cardiolite.  She will also follow up with Dr. Riley Kill on  March 12, 2005.  She will also follow up with her primary care physician,  Lorelle Formosa, M.D.  The patient will call for an appointment.   ADMISSION HISTORY AND PHYSICAL:  For the complete  history and physical  dictation, refer to the note done by Dr. Sharon Seller.  Briefly, Ms. Pho is a  70 year old African-American woman with history of hypertension and  hyperlipidemia, who presented with some syncopal spells.  She was evaluated  in the emergency room and found to have chest pain and presyncopal symptoms.  She was admitted to rule out an acute coronary event.   HOSPITAL COURSE:  Problem 1.  CHEST PRESSURE:  The patient's D-dimer was within normal limits,  her lower extremity Doppler was within normal limits, so the suspicion of  pulmonary embolism was low.  She had three sets of cardiac markers that were  essentially within normal limits.  Because of her multiple risk factors and  exertional chest pain, she was seen by Dr. Dorethea Clan from cardiology.  She was  scheduled for cardiac catheterization.  On March 01, 2005, she had cardiac  catheterization that showed a nonobstructive lesion on the LAD and possible  abnormal coronary anatomy.  She had a cardiac CT scan that the results are  not available at this time.  Dr. Riley Kill from cardiology would like to  perform an outpatient Cardiolite stress test to see if any of the lesions  identified on the angiography are of any functional significance.  The  patient currently feels great.  She did well on aspirin alone.  She had no  recurrence of her chest pain, so she will be discharged home on aspirin and  metoprolol with instructions to follow up as an outpatient with the [facility]  Cardiology group.   Problem 2.  HYPERLIPIDEMIA:  Ms. Kanaan fasting lipid panel shows a  significant degree of hyperlipidemia with a total cholesterol level of 268  and LDL  level of 177.  Her Lipitor was increased to 80 mg daily, and she  will continue this as an outpatient.   Problem 3.  HYPERTENSION:  This has been under acceptable control on beta  blockers alone and given the fact that she recently had two studies with  intravenous contrast, we have held the patient's ACE inhibitor and diuretic  for now.   Problem 4.  IMPAIRED FASTING GLUCOSE:  Upon admission Ms. Vastine was noted  to have elevated fasting plasma glucose level, the highest being 147.  Her  hemoglobin A1c was 6.5.  She was instructed to follow a low-carbohydrate  diet and to avoid concentrated sweets.      Lonia Blood, M.D.  Electronically Signed     SL/MEDQ  D:  03/05/2005  T:  03/05/2005  Job:  [id number]   cc:   Arturo Morton. Riley Kill, M.D. [facility]  [address]  [address]  [place]  [place] [address]   Lorelle Formosa, M.D.  Fax: [phone]
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
## 2010-06-22 NOTE — Assessment & Plan Note (Signed)
[facility]                              CARDIOLOGY OFFICE NOTE   NAME:Presas, ALIVYA WEGMAN                     MRN:          [id number]  DATE:12/06/2005                            DOB:          Dec 28, 1940    Ms. Aerts is in for followup.  She actually is doing much better.  Dr. Charlann Boxer  got her into rehab, and she is moving along really quite well.  Her venous  Dopplers were negative.  Her D-dimer was very borderline.  She is now on  metoprolol 50 mg p.o. b.i.d.   CURRENT MEDICATIONS:  1. Klor-Con 10 mEq daily.  2. Pravastatin 40 daily.  3. Metoprolol 50 mg p.o. b.i.d.  4. Triamterene/HCTZ 37/25.  5. Lotrel 5/20 daily.  6. Aspirin 81 mg daily.  7. Prednisone Forte ophthalmic.   PHYSICAL EXAMINATION:  VITAL SIGNS:  Blood pressure 120/82, pulse 86.  LUNGS:  Lung fields clear.  CARDIAC:  Rhythm is regular.   Overall, the patient is stable and seems to be improved.  She is  considerably improved from the last visit.  She has not had any syncope or  presyncope since then.  We will get a lipid and liver profile as well as a  basic metabolic profile on her, and I will see her back in followup in six  weeks.     Arturo Morton. Riley Kill, MD, [facility]  Electronically Signed    TDS/MedQ  DD: 12/06/2005  DT: 12/07/2005  Job #: [id number]

## 2010-06-22 NOTE — Assessment & Plan Note (Signed)
[place] [facility]                              CARDIOLOGY OFFICE NOTE   NAME:Drzewiecki, CHARLEIGH CORRENTI                     MRN:          [id number]  DATE:11/12/2005                            DOB:          1940-05-30    Ms. Salvador is in for followup.  She increased her beta blocker to 2 times  daily at 50 mg.  She said that the other day, she got up and felt  lightheaded.  She had a little bit of heaviness in her chest.  She thinks  she passed out.  She called for her son, he came in, and I have actually had  him come in and interviewed him carefully today.  He said that when he saw  her, she was all right.  They talked about calling the Emergency Squad, but  she elected not to do that.  She has had a little bit of increased swelling  in the right lower extremity, and according to her son and the patient she  has been laying around a lot, both before and after this event.  She cut  back on her metoprolol to 50 mg in the morning and 25 mg at night.  The  patient has an anomalous coronary anatomy, with the left coming off the  right, coursing between the aorta and pulmonary artery.  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   PHYSICAL EXAMINATION:  The weight is 211 pounds, the blood pressure is  138/82, and the pulse is 97.  She is able to get up, but requires a walker because of her knee.  LUNGS:  The lung fields are clear.  CARDIAC:  There is no significant murmur noted on examination.  There is trace edema to 1+ edema in the right lower extremity, and none in  the left lower extremity.  There is no obvious erythema or heat noted in the  right lower extremity.   I think we are going to go ahead and repeat her Dopplers of her lower  extremities.  She has been laying around a lot.  We had gotten a previous  Doppler in July, but I am concerned about the potential for deep venous  thrombosis, given her inactivity.  Dr. Cornelius Moras has  suggested she go back into  rehab.  In addition, we will go ahead and get a D-dimer, and if this were to  be significantly elevated I might consider doing a CT.  If the D-dimer is  negative as are the venous Dopplers, then we will not pursue this.   Finally, with regard to her hyperlipidemia, she is not quite at target at  the present time.  She is tolerating pravastatin 20 mg, and I may elect to  increase the dose of this.  We will see her back in followup in 2 to 3  weeks.       Arturo Morton. Riley Kill, MD, [facility]     TDS/MedQ  DD:  11/12/2005  DT:  11/14/2005  Job #:  [id number]

## 2010-06-22 NOTE — Assessment & Plan Note (Signed)
[place] [facility]                              CARDIOLOGY OFFICE NOTE   NAME:Janowicz, [name]                     MRN:          [id number]  DATE:10/10/2005                            DOB:          06-02-1940    Ms. [name] is in for a follow-up visit.  She really feels quite good.  She  has not been having any ongoing chest pain; however, she feels at the  present time she has not been too active, and she is continuing to use her  walker.  She has had orthopedic surgery.  Her pulse has been a little bit  higher, and I have questioned whether or not she has actually been taking  her medicine, but she claims that she is, and she has her medicine with her  today.   CURRENT MEDICATIONS:  1. Lotrel 5/20 1 daily.  2. Enteric-coated aspirin 81 mg daily.  3. K-Dur 10 mEq daily.  4. Metoprolol 50 mg 1/2 tablet p.o. b.i.d.  5. Pravastatin 20 mg nightly.  6. Triamterene/hydrochlorothiazide 37/25 daily.  7. Prednisone eye drops.   PHYSICAL EXAMINATION:  VITAL SIGNS:  Blood pressure 147/79, pulse 109.  When  rechecked by me, it was approximately 90.  LUNGS:  The lung fields were clear to auscultation and percussion.  CARDIAC:  Rhythm was regular without significant murmur.  EXTREMITIES:  No edema.   Recent Dopplers have been negative for DVT.   D-dimer during her hospitalization was normal.   IMPRESSION:  1. 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  2. Hyperlipidemia.  3. Hypertension.  4. Impaired glucose tolerance.  5. Obesity.  6. Lower extremity edema with negative Dopplers.   PLAN:  1. Continue medical therapy with increase in the metoprolol to 50 mg p.o.      b.i.d.  2. Return to clinic in four weeks for followup.                              Arturo Morton. Riley Kill, MD, [facility]    TDS/MedQ  DD:  10/10/2005  DT:  10/10/2005  Job #:  [id number]

## 2010-06-22 NOTE — Discharge Summary (Signed)
NAMEAHSLEY, ATTWOOD              ACCOUNT NO.:  [id number]   MEDICAL RECORD NO.:  [id number]          PATIENT TYPE:  INP   LOCATION:  4705                         FACILITY:  [facility]   PHYSICIAN:  Mobolaji B. Bakare, M.D.DATE OF BIRTH:  May 31, 1940   DATE OF ADMISSION:  02/27/2005  DATE OF DISCHARGE:                                 DISCHARGE SUMMARY   INTERIM DISCHARGE SUMMARY:  __________.   PRIMARY DIAGNOSES:  1.  Chest pressure with presyncope.  2.  Hyperglycemia/pre-diabetes, fasting blood sugar ranging between 100 and      105, hemoglobin A1c 6.5.  3.  Leukocytosis, most likely reactive, resolved.  4.  Hypertension, controlled.  5.  Hyperlipidemia.  6.  Obesity.   CONSULT:  Cardiology consult, Dr. Riley Kill.   PROCEDURES:  1.  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  2.  Cardiac catheterization done on March 01, 2005, by Dr. Riley Kill.      Please see report for full details.  There is 60% LAD lesion at the      takeoff and 30% narrowing in the proximal portion of the LAD after the      60% lesion.  There is hyperdynamic left ventricular function with      ejection fraction of 70%.  There is coronary anomaly, atypical coronary      vessel anatomy.  This will be further pursued by cardiac CT which is      scheduled for March 04, 2005.   BRIEF HISTORY:  Ms. Pincock is a pleasant 70 year old African-American female  who developed chest pressure and presyncope on the day of admission while  undergoing exercise at rehab unit in [facility].  She has been  having symptoms compatible with angina since she had right total knee  replacement in October, 2006.  Initial cardiac enzymes were negative at the  point of care.  The EKG was nonacute.  Hence, Ms. Morden was admitted for  further evaluation.  She was also noted to have leukocytosis on  initial  laboratory data of 13.5.   HOSPITAL COURSE:  1.  Chest pain with presyncope.  She did not have orthostasis.  She ruled      out for myocardial infarction with 3 negative sets of cardiac enzymes.      EKG was normal sinus rhythm with no acute ischemic changes.  She was      seen by Cardiology and it was felt that she needed cardiac      catheterization.  She was noted to have atypical coronary anatomy with      mild to moderate coronary artery disease involving 60% LAD at the ostium      and 30% narrowing in the proximal portion of the LAD after the 60%      lesion.  She is scheduled to have cardiac CT on March 04, 2005.  In      addition, there is a segmental diffuse 50%  narrowing of the marginal      branch of left circumflex artery.  2.  Hyperglycemia/pre-diabetes.  The patient's fasting blood sugar ranged      from 100 to 123.  She does have hemoglobin A1c of 6.5.  As per patient,      she has been told that she has got borderline diabetes and has not been      started on any medication.  At this point, she does not meet criteria      for diagnosing diabetes mellitus, but given her risk factors, which      include obesity, she is probably heading in that direction.  I did      discuss extensively with Ms. Eshleman.  She is open to having medications.      I will defer initiation of medication to Dr. Ronne Binning in the outpatient      setting.  She had been counseled regarding diet.  3.  Leukocytosis, most likely reactive.  Her UA was negative.  Chest x-ray      showed no acute disease.  This resolved without any antibiotic.  4.  Obesity.  She has been counseled regarding diet.  She is limited in      activity secondary to osteoarthritis involving her knee.  5.  Hypertension.  Patient's blood pressure was uncontrolled on the first      day of admission.  Given her complaint of chest pressure and, in      particular, in particular, with angina, blood pressure medication was       changed to beta-blocker and blood pressure has been controlled and she      will be discharged home on Lopressor, Maxzide and other blood pressure      medications she was using at home have been discontinued.  Blood      pressure at the time of dictation was 113/67.  6.  Hyperlipidemia.  Patient had a fasting lipid profile which showed LDL of      177, HDL of 32.  These parameters are uncontrolled, that is why she is      on Lipitor 20 mg q.on retiring.  Lipitor was increased to 80 mg q.on      retiring.  Will recommend checking AST and ALT in 2 weeks.   DISCHARGE MEDICATIONS:  1.  Aspirin 325 mg p.o. daily.  2.  Lipitor 80 mg p.o. q.on retiring.  3.  Toprol-XL 50 mg daily.  4.  Foltx one tablet p.o. daily.   RECOMMENDATIONS:  Check AST, ALT in 2 weeks.   ADDENDUM:  Dictated by discharging physician.      Mobolaji B. Corky Downs, M.D.  Electronically Signed     MBB/MEDQ  D:  03/03/2005  T:  03/03/2005  Job:  [id number]   cc:   Vernie Ammons  Fax: [phone]

## 2010-06-22 NOTE — Op Note (Signed)
NAMEMARIJA, CALAMARI              ACCOUNT NO.:  [id number]   MEDICAL RECORD NO.:  [id number]          PATIENT TYPE:  AMB   LOCATION:  ENDO                         FACILITY:  [facility]   PHYSICIAN:  Anselmo Rod, M.D.  DATE OF BIRTH:  12-02-1940   DATE OF PROCEDURE:  06/06/2006  DATE OF DISCHARGE:                               OPERATIVE REPORT   PROCEDURE PERFORMED:  Colonoscopy with snare polypectomy x 1 and cold  biopsies x 1.   ENDOSCOPIST:  Anselmo Rod, M.D..   INSTRUMENT USED:  Pentax video colonoscope.   INDICATIONS FOR PROCEDURE:  A 70 year old African American female  undergoing screening colonoscopy to rule out colonic polyps, masses,  etc.   PREPROCEDURE PREPARATION:  Informed consent was procured from the  patient.  The patient fasted for 8 hours prior to the procedure and  prepped with a bottle of magnesium citrate and gallon of NuLytely the  night prior to the procedure.  Risks and benefits of the procedure  including a 10% misread of cancer and polyp were discussed with the  patient as well.   PREPROCEDURE PHYSICAL:  VITAL SIGNS:  The patient had stable vital  signs.  NECK:  Supple.  CHEST:  Clear to auscultation.  HEART:  S1 and S2 regular.  ABDOMEN:  Soft with normal bowel sounds.   DESCRIPTION OF PROCEDURE:  The patient was placed in the left lateral  decubitus position and sedated with 75 mcg of Fentanyl and 7.5 mg of  Versed given intravenously in slow incremental doses.  Once the patient  was adequately sedated and maintained on low-flow oxygen and continuous  cardiac monitoring,  the Pentax video colonoscope was advanced into the  rectum to cecum.  There was evidence of melanosis coli throughout the  colonic mucosa with more prominent changes on the right side. A small  sessile polyp was  removed by cold biopsy from the cecum. Another small  sessile polyp was removed by cold snare from the cecum as well.  The  terminal ileum appeared healthy  throughout.  No lesions, masses or  polyps were seen.  There was no evidence of diverticulosis.  The patient  tolerated the procedure well without complications.   IMPRESSION:  1. Two polyps removed from the cecum, one by cold biopsy, one by cold      snare.  2. 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  3. Normal terminal ileum.  4. Otherwise unrevealing colonoscopy.   RECOMMENDATIONS:  1. Await pathology results.  2. Avoid all nonsteroidals including aspirin for the next 4 weeks.  3. Avoid the use of laxatives like Cascara and etc.  4. Used stool softeners as needed.  MiraLax is to be supplemented 17      grams once or twice a day if needed.  5. Outpatient followup as need arises in the future.      Anselmo Rod, M.D.  Electronically Signed     JNM/MEDQ  D:  06/06/2006  T:  06/06/2006  Job:  [id number]

## 2010-06-22 NOTE — Assessment & Plan Note (Signed)
[facility]                            CARDIOLOGY OFFICE NOTE   NAME:[name], [name]                     MRN:          [id number]  DATE:05/20/2006                            DOB:          [birth year]    Ms. Menter is in for a followup visit. She is stable. Her blood  pressures have been a little bit high. Her most recent lipids reveal an  LDL of 90 on Pravachol 80 mg. She has not been significantly short of  breath.   PHYSICAL EXAMINATION:  VITAL SIGNS:  On her exam today, the blood  pressure is 150/90 and confirmed by me. The pulse is 84.  LUNGS:  The lung fields are clear.  CARDIAC:  Rhythm is regular.  EXTREMITIES:  Do not reveal extensive edema.   The EKG reveals normal sinus rhythm with possible left atrial  enlargement.   IMPRESSION:  1. Coronary anomaly with the left system arising from the right      coronary cusp. Please see CT scan.  2. Hypertension, moderate control.  3. Hypercholesterolemia not at target.   PLAN:  1. We will discontinue her Pravachol.  2. I will start Simvastatin 40 mg q.h.s. since she can afford this.  3. I have elected to increase her metoprolol to 50 mg 1-1/2 tablets      twice daily. I have written out a prescription pad for her giving      her specific instructions and also giving her the prescriptions.      She will return to the clinic in 2 weeks at which time we will      reassess her status and then I will have her followup with Dr. Kellie Shropshire after that.     Arturo Morton. Riley Kill, MD, [facility]  Electronically Signed    TDS/MedQ  DD: 05/20/2006  DT: 05/20/2006  Job #: [id number]   cc:   Merlene Laughter. Renae Gloss, M.D.

## 2010-06-22 NOTE — Cardiovascular Report (Signed)
[name], [name]              ACCOUNT NO.:  [id number]   MEDICAL RECORD NO.:  [id number]          PATIENT TYPE:  INP   LOCATION:  6524                         FACILITY:  [facility]   PHYSICIAN:  Arturo Morton. Riley Kill, M.D. [facility] OF BIRTH:  09-02-1940   DATE OF PROCEDURE:  03/01/2005  DATE OF DISCHARGE:                              CARDIAC CATHETERIZATION   INDICATIONS:  The patient is a 70 year old who has had new onset of chest  pain with some presyncope.  The current study is done to assess coronary  anatomy.   PROCEDURE:  1.  Left heart catheterization.  2.  Selective coronary territory.  3.  Selective left ventriculography.   DESCRIPTION OF PROCEDURE:  Following informed consent, the patient was  brought to the catheterization laboratory and prepped and draped in the  usual fashion.  Through an anterior for puncture, the right femoral artery  was easily entered.  A 6-French sheath was placed.  We were unable to  cannulate the left coronary artery in the left coronary cusp.  We then  cannulated the right coronary artery and discovered that the entire left  system came off the right.  There was a near common ostium for all three  vessels. We were able to cannulate the LAD and the right using a standard  right catheter, then we used a right bypass catheter to cannulate the  circumflex system.  She tolerated procedure without complication.  She was  taken to the holding area in satisfactory clinical condition.  There were no  complications.   HEMODYNAMIC DATA.:  1.  Central aortic pressure 152/87.  2.  Left ventricular pressure 155/63.  3.  No gradient on pullback across the aortic valve.   ANGIOGRAPHIC DATA:  1.  There is a common ostium involving the right coronary cusp for all three      coronary vessels.  2.  Ventriculography was performed in the RAO projection.  There appears to      be apical hypertrophy and hyperdynamic LV function with ejection      fraction of  greater than 70%.  This would suggest hypertensive changes.  3.  The left coronary artery comes off.  It provides a circumflex branch      that goes down into a marginal distribution and then basically supplies      a posterolateral segment.  There is segmental diffuse 50% narrowing of      this segment.  This was really quite distal along the inferolateral      surface.  4.  The left anterior descending artery and its course may well be between      the aortic root and the pulmonary artery.  There is at least a 60%      ostial lesion at the takeoff.  The vessel then opens up and provides an      apical LAD as well as a diagonal branch.  The apical LAD appears to be      at least partially intramyocardial.  5.  The circumflex provides two large marginal branches without critical  narrowing.  This comes off the right cusp at an inferior takeoff from      the actual left main.  6.  The right coronary itself is relatively small and provides an inferior      branch.  As previously noted, the posterolateral appears to come off of      a branch that is attached to the LAD.  7.  One additional finding is about 30% narrowing in the proximal portion of      the LAD after the 60% lesion.   CONCLUSION:  1.  Hyperdynamic left ventricular function.  2.  Coronary anomaly characterized by the entire left system arising from      the right coronary cusp with possible distribution of the left main left      anterior descending artery posterolateral segments arising from a vessel      that courses between the aorta and pulmonary artery.   DISPOSITION:  I think we need to get the anatomy squared away by looking at  the CT.  I will discuss with Dr. Eden Emms.  We will try to arrange this for  Monday.      Arturo Morton. Riley Kill, M.D. [facility]  Electronically Signed     TDS/MEDQ  D:  03/01/2005  T:  03/01/2005  Job:  [id number]   cc:   Lonia Blood, M.D.  Fax: [phone]   Lorelle Formosa, M.D.   Fax: [phone]   Madlyn Frankel. Charlann Boxer, M.D.  Fax: [phone]   [facility]   patient's medical record

## 2010-06-22 NOTE — H&P (Signed)
[name], [name] NO.:  [id number]   MEDICAL RECORD NO.:  [id number]          PATIENT TYPE:  EMS   LOCATION:  MAJO                         FACILITY:  [facility]   PHYSICIAN:  Lonia Blood, M.D.DATE OF BIRTH:  19-Aug-1940   DATE OF ADMISSION:  02/27/2005  DATE OF DISCHARGE:                                HISTORY & PHYSICAL   PRIMARY CARE PHYSICIAN:  Dr. Ronne Binning   CHIEF COMPLAINT:  Chest discomfort with presyncope.   HISTORY OF PRESENT ILLNESS:  [name] is a very pleasant female  with the medical history noted below.  Of note, she underwent a right total  knee replacement in October of 2006 and has had significant difficulty  ambulating since that time and admits that she spends large amounts of time  at home either on the couch or sitting in a chair.  Otherwise, however, she  had been in her usual state of health until yesterday morning.  She awoke  and felt normal.  She walked to her kitchen to have breakfast.  She then sat  down.  Shortly after sitting down she felt extremely weak and like I was  going to pass out.  She describes her vision getting very dark like I  couldn't see.  She then broke out in a sweat.  She experienced palpitations  which she describes as my heart felt like it was going to jump out of my  chest.  She held very still and her symptoms resolved and vision returned  in just a few moments.  She remained seated for approximately half an hour  at which time she felt much better.  The remainder of the day she felt  lethargic, but otherwise had no complaints.  This morning she awoke and had  a very similar episode.  She got up out of bed, walked across her house, and  felt herself getting extremely weak again.  She found her way to a chair and  sat down.  She had a blacking out spell, though she denies that she  actually became unconscious.  Again, she felt palpitations.  This time she  felt a heaviness in my chest and had  trouble breathing.  Symptoms resolved  after 20 minutes.  The rest of the day went by without difficulty.  She then  presented this afternoon to her rehabilitation facility.  She continued to  work to increase mobility in her right knee.  While there she climbed into a  treadmill and started to feel another episode coming on.  Apparently a  therapist noted a significant change in her.  Blood pressure was checked and  reportedly was too high.  At the urging of the therapist the patient was  then transferred to [facility] Emergency Room via family.  In the emergency  room she says she feels real tired, but otherwise has no acute complaints.   REVIEW OF SYSTEMS:  Comprehensive review of system is unremarkable with the  exception to the positive elements in the history of present illness above.   PAST MEDICAL HISTORY:  1.  Hypertension.  2.  Gastritis.  3.  Osteoarthritis status post right total knee replacement October 2006.  4.  Anxiety disorder.  5.  Hypercholesterolemia.   OUTPATIENT MEDICATIONS:  1.  Lipitor 20 mg q.h.s.  2.  Maxzide 25/37.5 daily.  3.  Possibly Lotrel, though patient is not sure.   ALLERGIES:  PENICILLIN and SULFA.   FAMILY HISTORY:  The patient's mother died of cancer of the stomach at age  of 42.  Patient's father died of an MI at age 64.  Patient has one brother  and two sisters who are all healthy.   SOCIAL HISTORY:  The patient does not smoke.  She does not drink.  She lives  in [place].  She is a widow.  She has one daughter and one son who are  all healthy.   DATA REVIEW:  White count is elevated at 13.5.  Hemoglobin is normal at 14.1  and platelet count is normal at 329.  MCV is 80 which is normal.  Electrolytes are balanced.  BUN is elevated at 23.  Creatinine is 1.  Serum  glucose is elevated at 136.  pH is 7.425 with pCO2 of 37.  PT is greater  than 90.  INR is greater than 11.9 per laboratory reports which are being  repeated.  Myoglobin is  normal.  MB is 2.9.  Troponin I is less than 0.05.  Chest x-ray reveals no acute disease and a calcified splenic mass.  Review  of old x-rays reveals a CT scan of the abdomen October 2006 which also  identified this mass and reported it to be consistent with a chronic  hematoma with calcifications.  12-lead EKG is reviewed.  It reveals sinus  rhythm at 95 beats per minute with questionable right bundle branch block,  but no acute ST or T-wave changes appreciable at this time.   PHYSICAL EXAMINATION:  VITAL SIGNS:  Temperature 98.3, blood pressure  141/86, heart rate 112, respiratory rate 16, O2 saturations 99% on room air.  GENERAL:  Well-developed, well-nourished female in no acute respiratory  distress.  HEENT:  Patient has a left ptosis.  Extraocular muscles are intact  bilaterally.  OC/OP is clear.  Normocephalic, atraumatic.  NECK:  No lymphadenopathy.  No thyromegaly.  No JVD.  LUNGS:  Mild bibasilar crackles without wheeze with good air movement  throughout all fields.  CARDIOVASCULAR:  Mild tachycardia approximately 100 beats per minute during  my examination.  No appreciable murmur, rub, or gallop.  ABDOMEN:  Nontender, nondistended.  Soft.  Bowel sounds present.  No  hepatosplenomegaly.  No rebound.  No ascites.  EXTREMITIES:  The patient has a 1+ left lower extremity edema which is not  present on the right.  There is no erythema.  There are no cutaneous  findings to suggest a cellulitis.  There is no calor.  I am not able to  appreciate cords in either leg.  There is no calf tenderness in either leg.  NEUROLOGIC:  5/5 strength bilateral upper and lower extremities.  Intact  sensation to touch throughout.  Cranial nerves II-XII are intact  bilaterally.  Patient is alert and oriented x4.  There is no Babinski.   IMPRESSION AND PLAN:  1.  Markedly elevated INR.  There is no clinical evidence that this is, in     fact, a true laboratory value.  There has been no history of  recent      bleeding.  There is no hematoma formation anywhere with a  full body      inspection.  There has been no bleeding from the teeth, gums, or eyes.      Hemoglobin is normal, though the patient is admittedly significantly      dehydrated.  If this is real there is no obvious reason to explain why      as the patient has not been on Coumadin since December of this year.  I      am rechecking her coags as a stat.  I am holding on giving the patient      vitamin K because I am actually concerned that she may, in fact, be      suffering with a pulmonary embolism.  The patient will be kept on bed      rest so as to minimize risk for fall so that if this is in fact real we      will hopefully have a very low chance of her bleeding spontaneously.  I      will follow up on her re-test.  If this is, in fact, real I will then      dose her with vitamin K and we will pursue a full evaluation.  2.  Chest pain with presyncopal symptoms.  This may be something as simple      as dehydration and a possible urinary tract infection.  Urinalysis has      not been obtained in the emergency room.  BUN and creatinine ratio      reveals the evidence of a significant dehydration and clinically this      appears to be the case as well with her tachycardia.  She will be      hydrated empirically with isotonic crystalloid intravenous fluid.  We      will follow her BUN and creatinine ratio closely.  Urinalysis is also      being obtained to rule out the evidence of a pyelonephritis which could      be exacerbating all of her symptoms.  We must also consider more serious      issues.  Given her decreased mobility since October and her own      admission that she mostly sits around the house all day, there is      significant concern for pulmonary embolism.  This concern is further      heightened by the fact that the patient does have asymmetric lower      extremity edema.  I am not pursuing a CT scan at  present as the patient      is significantly clinically dehydrated and given her age I am concerned      that this could precipitate contrast nephropathy.  I am not able to      empirically initiate anticoagulation because of the possibility that the      patient is suffering with an idiopathic coagulopathy to begin with.  I      will obtain Doppler examinations.  If follow-up INR is normal then my      pre test probability for pulmonary embolism is significantly high enough      that I will proceed to treat her in using a heparin drip.  We will also      rule out the possibility of silent myocardial infarction in this patient      with a positive family history of hypertension, hypercholesterolemia.      Serial EKGs and cardiac enzymes will be followed.  Cardiac      echocardiogram will also be obtained as will TSH and BNP.  3.  Elevated serum glucose.  Patient has no history of diabetes to her     recollection.  I will check a hemoglobin A1c.  We will reassess this in      the morning with a fasting blood sugar.  4.  Dehydration.  As discussed above, the patient does display evidence of      significant dehydration.  We will correct this with intravenous fluid      and hold her diuretic.  5.  Leukocytosis.  Patient has a marked leukocytosis.  My concern is that      this represents a urinary tract infection/pyelonephritis.  The patient      does not clinically appear to have a pneumonia and chest x-ray is also      unremarkable.  There is no evidence of a cellulitis.  We will check a      urinalysis.  I will hold on empiric antibiotics until we are more clear      as to the potential source.  The patient is not febrile and does not      have symptoms of an acute severe infection and therefore this may also      be simply be stress demargination.  6.  Hypercholesterolemia.  I will hold the patient's Lipitor until we can      correct her volume depletion and better assess her liver  function as      well as her possible coagulopathy.  7.  Hypertension.  We will hold the patient's diuretics and simply follow      her for now.  If blood pressure remains significantly elevated we will      initiate medical treatment as appropriate.      Lonia Blood, M.D.  Electronically Signed     JTM/MEDQ  D:  02/27/2005  T:  02/27/2005  Job:  [id number]

## 2010-07-26 ENCOUNTER — Encounter: Payer: Self-pay | Admitting: Cardiology

## 2010-08-07 ENCOUNTER — Other Ambulatory Visit: Payer: Self-pay | Admitting: *Deleted

## 2010-08-07 MED ORDER — PRAVASTATIN SODIUM 40 MG PO TABS: 40.0000 mg | ORAL_TABLET | Freq: Every evening | ORAL | Status: DC

## 2010-11-13 ENCOUNTER — Encounter: Payer: Self-pay | Admitting: Cardiology

## 2010-11-14 ENCOUNTER — Encounter: Payer: Self-pay | Admitting: Cardiology

## 2010-11-14 ENCOUNTER — Ambulatory Visit (INDEPENDENT_AMBULATORY_CARE_PROVIDER_SITE_OTHER): Payer: Medicare Other | Admitting: Cardiology

## 2010-11-14 DIAGNOSIS — I10 Essential (primary) hypertension: Secondary | ICD-10-CM

## 2010-11-14 DIAGNOSIS — E7800 Pure hypercholesterolemia, unspecified: Secondary | ICD-10-CM

## 2010-11-14 DIAGNOSIS — R609 Edema, unspecified: Secondary | ICD-10-CM

## 2010-11-14 DIAGNOSIS — I2510 Atherosclerotic heart disease of native coronary artery without angina pectoris: Secondary | ICD-10-CM

## 2010-11-14 MED ORDER — HYDROCHLOROTHIAZIDE 12.5 MG PO CAPS: 12.5000 mg | ORAL_CAPSULE | Freq: Every day | ORAL | Status: DC

## 2010-11-14 NOTE — Progress Notes (Signed)
HPI:  She is stable, but gets a bit short of breath, often with laying down   She has to catch her breath, and has trouble lying flat.  No chest pain.  Stopped diuretics in past because she did not want to go to the bathroom.  Had labs in Dr. Mathews Robinsons office earlier today.   Current Outpatient Prescriptions  Medication Sig Dispense Refill  . amLODipine-benazepril (LOTREL) 5-20 MG per capsule Take 1 capsule by mouth daily.        Marland Kitchen aspirin 81 MG tablet Take 81 mg by mouth daily.        . carbidopa-levodopa (PARCOPA) 25-100 MG per disintegrating tablet Take 3 tablets by mouth 3 (three) times daily.       . metoprolol (LOPRESSOR) 50 MG tablet TAKE 1/2 TABLET BY MOUTH TWICE A DAY  60 tablet  6  . Specialty Vitamins Products (ONE-A-DAY BONE STRENGTH) 500-28-100 MG-MG-UNIT TABS Take 1 tablet by mouth 3 (three) times daily.          Allergies  Allergen Reactions  . Sulfonamide Derivatives     Past Medical History  Diagnosis Date  . Coronary artery disease   . Hypertension   . Hypercholesteremia   . Obesity   . Impaired glucose tolerance   . Anxiety disorder   . Gastritis   . Osteoarthritis   . Heart murmur     from age 41 associated with some hemoptysis  . H/O: hysterectomy     Past Surgical History  Procedure Date  . Carpal tunnel release     right  . Tonsillectomy   . Eye surgery     left  . Replacement total knee     right    Family History  Problem Relation Age of Onset  . Cancer Mother 31    died  . Heart attack Father 7    died    History   Social History  . Marital Status: Widowed    Spouse Name: N/A    Number of Children: 2  . Years of Education: N/A   Occupational History  . retired    Social History Main Topics  . Smoking status: Never Smoker   . Smokeless tobacco: Not on file  . Alcohol Use: No  . Drug Use: No  . Sexually Active: Not on file   Other Topics Concern  . Not on file   Social History Narrative  . No narrative on file     ROS: Please see the HPI.  All other systems reviewed and negative.  PHYSICAL EXAM:  BP 156/90  Pulse 86  Resp 18  Ht 5\' 2"  (1.575 m)  Wt 177 lb 12.8 oz (80.65 kg)  BMI 32.52 kg/m2  General: Well developed, well nourished, in no acute distress. Head:  Normocephalic and atraumatic. Neck: no JVD Lungs: Clear to auscultation and percussion. Heart: Normal S1 and S2.  No murmur, rubs or gallops.  Abdomen:  Normal bowel sounds; soft; non tender; no organomegaly Pulses: Pulses normal in all 4 extremities. Extremities: No clubbing or cyanosis. Trace edema.   Neurologic: Alert and oriented x 3.  EKG:  NSR.  Cannot exclude old lateral Mi.    ASSESSMENT AND PLAN:

## 2010-11-14 NOTE — Assessment & Plan Note (Signed)
BP is elevated.  Would like to add low dose HCTZ to regimen.  Had labs done in Dr. Mathews Robinsons office.  Will need to have follow up for labs appropriately.

## 2010-11-14 NOTE — Assessment & Plan Note (Signed)
Labs done at Dr. Mathews Robinsons office.

## 2010-11-14 NOTE — Assessment & Plan Note (Signed)
No recurrent chest pain.  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  Would continue medical therapy at present.  On beta and calcium blocking agents.

## 2010-11-14 NOTE — Patient Instructions (Signed)
Your physician has recommended you make the following change in your medication: START HCTZ 12.5mg  take one by mouth daily in the morning  Your physician recommends that you return for lab work in: 1 WEEK (BMP 401.9, 428.32, 414.01)  Your physician recommends that you schedule a follow-up appointment in: 4-6 WEEKS

## 2010-11-14 NOTE — Assessment & Plan Note (Signed)
Has trace edema.  Adding HCTZ which should help.

## 2010-11-21 ENCOUNTER — Other Ambulatory Visit (INDEPENDENT_AMBULATORY_CARE_PROVIDER_SITE_OTHER): Payer: Medicare Other | Admitting: *Deleted

## 2010-11-21 DIAGNOSIS — I2510 Atherosclerotic heart disease of native coronary artery without angina pectoris: Secondary | ICD-10-CM

## 2010-11-21 DIAGNOSIS — E7800 Pure hypercholesterolemia, unspecified: Secondary | ICD-10-CM

## 2010-11-21 DIAGNOSIS — R609 Edema, unspecified: Secondary | ICD-10-CM

## 2010-11-21 DIAGNOSIS — I10 Essential (primary) hypertension: Secondary | ICD-10-CM

## 2010-11-21 LAB — BASIC METABOLIC PANEL
BUN: 22 mg/dL (ref 6–23)
CO2: 20 mEq/L (ref 19–32)
Calcium: 9.3 mg/dL (ref 8.4–10.5)
Chloride: 108 mEq/L (ref 96–112)
Creatinine, Ser: 1.2 mg/dL (ref 0.4–1.2)
GFR: 55.6 mL/min — ABNORMAL LOW (ref >60.00)
Glucose, Bld: 104 mg/dL — ABNORMAL HIGH (ref 70–99)
Potassium: 4 mEq/L (ref 3.5–5.1)
Sodium: 142 mEq/L (ref 135–145)

## 2010-12-26 ENCOUNTER — Encounter: Payer: Self-pay | Admitting: Cardiology

## 2010-12-26 ENCOUNTER — Ambulatory Visit (INDEPENDENT_AMBULATORY_CARE_PROVIDER_SITE_OTHER): Payer: Medicare Other | Admitting: Cardiology

## 2010-12-26 VITALS — BP 142/80 | HR 61 | Ht 62.0 in | Wt 177.0 lb

## 2010-12-26 DIAGNOSIS — I2510 Atherosclerotic heart disease of native coronary artery without angina pectoris: Secondary | ICD-10-CM

## 2010-12-26 DIAGNOSIS — E7800 Pure hypercholesterolemia, unspecified: Secondary | ICD-10-CM

## 2010-12-26 DIAGNOSIS — I10 Essential (primary) hypertension: Secondary | ICD-10-CM

## 2010-12-26 NOTE — Patient Instructions (Signed)
Your physician wants you to follow-up in: 6 MONTHS.  You will receive a reminder letter in the mail two months in advance. If you don't receive a letter, please call our office to schedule the follow-up appointment.  Your physician recommends that you continue on your current medications as directed. Please refer to the Current Medication list given to you today.  

## 2010-12-26 NOTE — Progress Notes (Signed)
Patient ID: [name], female   DOB: [birth year], 70 y.o.   MRN: [id number]

## 2010-12-26 NOTE — Assessment & Plan Note (Signed)
No recurrent angina whatsoever.  Continues to do well.  Will continue medical therapy.

## 2010-12-26 NOTE — Progress Notes (Signed)
HPI:  She is doing well.  Not having any chest pain.  Shortness of breath is resolved.  She is getting her labs done with Dr. Renae Gloss.  Current Outpatient Prescriptions  Medication Sig Dispense Refill  . amLODipine-benazepril (LOTREL) 5-20 MG per capsule Take 1 capsule by mouth daily.        Marland Kitchen aspirin 81 MG tablet Take 81 mg by mouth daily.        . carbidopa-levodopa (PARCOPA) 25-100 MG per disintegrating tablet Take 3 tablets by mouth 3 (three) times daily.       . hydrochlorothiazide (MICROZIDE) 12.5 MG capsule Take 1 capsule (12.5 mg total) by mouth daily.  30 capsule  11  . metoprolol (LOPRESSOR) 50 MG tablet TAKE 1/2 TABLET BY MOUTH TWICE A DAY  60 tablet  6  . Specialty Vitamins Products (ONE-A-DAY BONE STRENGTH) 500-28-100 MG-MG-UNIT TABS Take 1 tablet by mouth 3 (three) times daily.          Allergies  Allergen Reactions  . Sulfonamide Derivatives     Past Medical History  Diagnosis Date  . Coronary artery disease   . Hypertension   . Hypercholesteremia   . Obesity   . Impaired glucose tolerance   . Anxiety disorder   . Gastritis   . Osteoarthritis   . Heart murmur     from age 74 associated with some hemoptysis  . H/O: hysterectomy     Past Surgical History  Procedure Date  . Carpal tunnel release     right  . Tonsillectomy   . Eye surgery     left  . Replacement total knee     right    Family History  Problem Relation Age of Onset  . Cancer Mother 54    died  . Heart attack Father 18    died    History   Social History  . Marital Status: Widowed    Spouse Name: N/A    Number of Children: 2  . Years of Education: N/A   Occupational History  . retired    Social History Main Topics  . Smoking status: Never Smoker   . Smokeless tobacco: Not on file  . Alcohol Use: No  . Drug Use: No  . Sexually Active: Not on file   Other Topics Concern  . Not on file   Social History Narrative  . No narrative on file    ROS: Please see the HPI.  All  other systems reviewed and negative.  PHYSICAL EXAM:  BP 142/80  Pulse 61  Ht 5\' 2"  (1.575 m)  Wt 80.287 kg (177 lb)  BMI 32.37 kg/m2  General: Well developed, well nourished, in no acute distress. Head:  Normocephalic and atraumatic. Neck: no JVD Lungs: Clear to auscultation and percussion. Heart: Normal S1 and S2.  No murmur, rubs or gallops.  Pulses: Pulses normal in all 4 extremities. Extremities: No clubbing or cyanosis. No edema. Neurologic: Alert and oriented x 3.  EKG:  NSR.  WNL.   ASSESSMENT AND PLAN:

## 2010-12-26 NOTE — Assessment & Plan Note (Signed)
Improved since last visit.  No shortness of breath.  Labs with Dr. Renae Gloss.

## 2010-12-26 NOTE — Assessment & Plan Note (Signed)
Not on statin.  Will defer to Dr. Renae Gloss.

## 2011-03-13 ENCOUNTER — Telehealth: Payer: Self-pay | Admitting: Cardiology

## 2011-04-13 ENCOUNTER — Encounter (HOSPITAL_COMMUNITY): Payer: Self-pay | Admitting: Emergency Medicine

## 2011-04-13 ENCOUNTER — Emergency Department (HOSPITAL_COMMUNITY): Payer: Medicare Other

## 2011-04-13 ENCOUNTER — Other Ambulatory Visit: Payer: Self-pay

## 2011-04-13 ENCOUNTER — Inpatient Hospital Stay (HOSPITAL_COMMUNITY)
Admission: EM | Admit: 2011-04-13 | Discharge: 2011-04-15 | DRG: 308 | Disposition: A | Discharge status: Home / Self Care | Payer: Medicare Other | Attending: Internal Medicine | Admitting: Internal Medicine

## 2011-04-13 DIAGNOSIS — I10 Essential (primary) hypertension: Secondary | ICD-10-CM | POA: Insufficient documentation

## 2011-04-13 DIAGNOSIS — I5030 Unspecified diastolic (congestive) heart failure: Secondary | ICD-10-CM | POA: Insufficient documentation

## 2011-04-13 DIAGNOSIS — G20 Parkinson's disease: Secondary | ICD-10-CM | POA: Diagnosis present

## 2011-04-13 DIAGNOSIS — I480 Paroxysmal atrial fibrillation: Secondary | ICD-10-CM | POA: Insufficient documentation

## 2011-04-13 DIAGNOSIS — I4891 Unspecified atrial fibrillation: Principal | ICD-10-CM

## 2011-04-13 DIAGNOSIS — F411 Generalized anxiety disorder: Secondary | ICD-10-CM | POA: Insufficient documentation

## 2011-04-13 DIAGNOSIS — Z882 Allergy status to sulfonamides status: Secondary | ICD-10-CM

## 2011-04-13 DIAGNOSIS — Z79899 Other long term (current) drug therapy: Secondary | ICD-10-CM

## 2011-04-13 DIAGNOSIS — G20A1 Parkinson's disease without dyskinesia, without mention of fluctuations: Secondary | ICD-10-CM | POA: Diagnosis present

## 2011-04-13 DIAGNOSIS — Z96659 Presence of unspecified artificial knee joint: Secondary | ICD-10-CM

## 2011-04-13 DIAGNOSIS — I509 Heart failure, unspecified: Secondary | ICD-10-CM | POA: Diagnosis present

## 2011-04-13 DIAGNOSIS — I2510 Atherosclerotic heart disease of native coronary artery without angina pectoris: Secondary | ICD-10-CM | POA: Insufficient documentation

## 2011-04-13 DIAGNOSIS — I5031 Acute diastolic (congestive) heart failure: Secondary | ICD-10-CM | POA: Diagnosis present

## 2011-04-13 DIAGNOSIS — E669 Obesity, unspecified: Secondary | ICD-10-CM | POA: Insufficient documentation

## 2011-04-13 DIAGNOSIS — M1990 Unspecified osteoarthritis, unspecified site: Secondary | ICD-10-CM | POA: Insufficient documentation

## 2011-04-13 DIAGNOSIS — E7800 Pure hypercholesterolemia, unspecified: Secondary | ICD-10-CM | POA: Insufficient documentation

## 2011-04-13 HISTORY — DX: Unspecified atrial fibrillation: I48.91

## 2011-04-13 LAB — DIFFERENTIAL
Basophils Absolute: 0 10*3/uL (ref 0.0–0.1)
Basophils Relative: 0 % (ref 0–1)
Eosinophils Absolute: 0.3 10*3/uL (ref 0.0–0.7)
Eosinophils Relative: 3 % (ref 0–5)
Lymphocytes Relative: 15 % (ref 12–46)
Lymphs Abs: 1.5 10*3/uL (ref 0.7–4.0)
Monocytes Absolute: 0.6 10*3/uL (ref 0.1–1.0)
Monocytes Relative: 6 % (ref 3–12)
Neutro Abs: 7.4 10*3/uL (ref 1.7–7.7)
Neutrophils Relative %: 76 % (ref 43–77)

## 2011-04-13 LAB — COMPREHENSIVE METABOLIC PANEL
ALT: 9 U/L (ref 0–35)
AST: 19 U/L (ref 0–37)
Albumin: 3.8 g/dL (ref 3.5–5.2)
Alkaline Phosphatase: 84 U/L (ref 39–117)
BUN: 18 mg/dL (ref 6–23)
CO2: 26 mEq/L (ref 19–32)
Calcium: 10.1 mg/dL (ref 8.4–10.5)
Chloride: 105 mEq/L (ref 96–112)
Creatinine, Ser: 0.96 mg/dL (ref 0.50–1.10)
GFR calc Af Amer: 66 mL/min — ABNORMAL LOW (ref >90)
GFR calc non Af Amer: 57 mL/min — ABNORMAL LOW (ref >90)
Glucose, Bld: 96 mg/dL (ref 70–99)
Potassium: 4.2 mEq/L (ref 3.5–5.1)
Sodium: 142 mEq/L (ref 135–145)
Total Bilirubin: 0.5 mg/dL (ref 0.3–1.2)
Total Protein: 7.2 g/dL (ref 6.0–8.3)

## 2011-04-13 LAB — TROPONIN I: Troponin I: <0.3 ng/mL (ref <0.30)

## 2011-04-13 LAB — CBC
HCT: 46.1 % — ABNORMAL HIGH (ref 36.0–46.0)
Hemoglobin: 14.8 g/dL (ref 12.0–15.0)
MCH: 26.6 pg (ref 26.0–34.0)
MCHC: 32.1 g/dL (ref 30.0–36.0)
MCV: 82.9 fL (ref 78.0–100.0)
Platelets: 152 10*3/uL (ref 150–400)
RBC: 5.56 MIL/uL — ABNORMAL HIGH (ref 3.87–5.11)
RDW: 14.8 % (ref 11.5–15.5)
WBC: 9.8 10*3/uL (ref 4.0–10.5)

## 2011-04-13 LAB — APTT: aPTT: 31 seconds (ref 24–37)

## 2011-04-13 LAB — CARDIAC PANEL(CRET KIN+CKTOT+MB+TROPI)
CK, MB: 3.3 ng/mL (ref 0.3–4.0)
Relative Index: 2.6 — ABNORMAL HIGH (ref 0.0–2.5)
Total CK: 126 U/L (ref 7–177)
Troponin I: <0.3 ng/mL (ref <0.30)

## 2011-04-13 LAB — PROTIME-INR
INR: 1.02 (ref 0.00–1.49)
Prothrombin Time: 13.6 seconds (ref 11.6–15.2)

## 2011-04-13 MED ORDER — TETRAHYDROZOLINE HCL 0.05 % OP SOLN
1.0000 [drp] | Freq: Two times a day (BID) | OPHTHALMIC | Status: DC
  Administered 2011-04-13 – 2011-04-15 (×4): 1 [drp] via OPHTHALMIC
  Filled 2011-04-13: qty 15

## 2011-04-13 MED ORDER — DILTIAZEM HCL 50 MG/10ML IV SOLN
10.0000 mg | Freq: Once | INTRAVENOUS | Status: AC
  Administered 2011-04-13: 10 mg via INTRAVENOUS
  Filled 2011-04-13: qty 2

## 2011-04-13 MED ORDER — SODIUM CHLORIDE 0.9 % IJ SOLN
3.0000 mL | Freq: Two times a day (BID) | INTRAMUSCULAR | Status: DC
  Administered 2011-04-14 – 2011-04-15 (×2): 3 mL via INTRAVENOUS

## 2011-04-13 MED ORDER — SODIUM CHLORIDE 0.9 % IJ SOLN: 3.0000 mL | INTRAMUSCULAR | Status: DC | PRN

## 2011-04-13 MED ORDER — DILTIAZEM HCL 100 MG IV SOLR
5.0000 mg/h | INTRAVENOUS | Status: DC
  Administered 2011-04-13: 5 mg/h via INTRAVENOUS
  Administered 2011-04-13 – 2011-04-14 (×2): 10 mg/h via INTRAVENOUS
  Filled 2011-04-13 (×3): qty 100

## 2011-04-13 MED ORDER — SODIUM CHLORIDE 0.9 % IV SOLN: 250.0000 mL | INTRAVENOUS | Status: DC | PRN

## 2011-04-13 MED ORDER — POTASSIUM CHLORIDE CRYS ER 20 MEQ PO TBCR
EXTENDED_RELEASE_TABLET | ORAL | Status: AC
  Administered 2011-04-13: 40 meq
  Filled 2011-04-13: qty 2

## 2011-04-13 MED ORDER — DABIGATRAN ETEXILATE MESYLATE 150 MG PO CAPS
150.0000 mg | ORAL_CAPSULE | Freq: Two times a day (BID) | ORAL | Status: DC
  Administered 2011-04-13 – 2011-04-15 (×4): 150 mg via ORAL
  Filled 2011-04-13 (×5): qty 1

## 2011-04-13 MED ORDER — METOPROLOL TARTRATE 25 MG PO TABS
25.0000 mg | ORAL_TABLET | Freq: Two times a day (BID) | ORAL | Status: DC
  Administered 2011-04-13 – 2011-04-15 (×4): 25 mg via ORAL
  Filled 2011-04-13 (×5): qty 1

## 2011-04-13 MED ORDER — SIMVASTATIN 20 MG PO TABS
20.0000 mg | ORAL_TABLET | Freq: Every day | ORAL | Status: DC
  Administered 2011-04-13: 20 mg via ORAL
  Filled 2011-04-13 (×2): qty 1

## 2011-04-13 NOTE — H&P (Signed)
[name] is an 71 y.o. female.   Chief Complaint: chest pressure and sob HPI: The patient is a 71 yo woman with a h/o anomalous coronary circulation, HTN, dyslipidemia, who was in his usual state of health until awakening this morning with sob and chest pressure. She presented to the ER and was found to be in atrial fibrillation with an RVR. SHe has not had syncope. No change in bowel or bladder habit. She denies excess caffiene.   Past Medical History  Diagnosis Date  . Coronary artery disease   . Hypertension   . Hypercholesteremia   . Obesity   . Impaired glucose tolerance   . Anxiety disorder   . Gastritis   . Osteoarthritis   . Heart murmur     from age 55 associated with some hemoptysis  . H/O: hysterectomy     Past Surgical History  Procedure Date  . Carpal tunnel release     right  . Tonsillectomy   . Eye surgery     left  . Replacement total knee     right    Family History  Problem Relation Age of Onset  . Cancer Mother 7    died  . Heart attack Father 13    died   Social History:  reports that she has never smoked. She does not have any smokeless tobacco history on file. She reports that she does not drink alcohol or use illicit drugs.  Allergies:  Allergies  Allergen Reactions  . Sulfonamide Derivatives     Unknown.    Medications Prior to Admission  Medication Dose Route Frequency Provider Last Rate Last Dose  . diltiazem (CARDIZEM) 100 mg in dextrose 5 % 100 mL infusion  5-15 mg/hr Intravenous Titrated Ward Givens, MD 5 mL/hr at 04/13/11 0931 5 mg/hr at 04/13/11 0931  . diltiazem (CARDIZEM) injection SOLN 10 mg  10 mg Intravenous Once Ward Givens, MD   10 mg at 04/13/11 0931   No current outpatient prescriptions on file as of 04/13/2011.    Results for orders placed during the hospital encounter of 04/13/11 (from the past 48 hour(s))  CBC     Status: Abnormal   Collection Time   04/13/11  9:52 AM      Component Value Range Comment   WBC  9.8  4.0 - 10.5 (K/uL)    RBC 5.56 (*) 3.87 - 5.11 (MIL/uL)    Hemoglobin 14.8  12.0 - 15.0 (g/dL)    HCT 16.1 (*) 09.6 - 46.0 (%)    MCV 82.9  78.0 - 100.0 (fL)    MCH 26.6  26.0 - 34.0 (pg)    MCHC 32.1  30.0 - 36.0 (g/dL)    RDW 04.5  40.9 - 81.1 (%)    Platelets 152  150 - 400 (K/uL) PLATELET COUNT CONFIRMED BY SMEAR  DIFFERENTIAL     Status: Normal   Collection Time   04/13/11  9:52 AM      Component Value Range Comment   Neutrophils Relative 76  43 - 77 (%)    Neutro Abs 7.4  1.7 - 7.7 (K/uL)    Lymphocytes Relative 15  12 - 46 (%)    Lymphs Abs 1.5  0.7 - 4.0 (K/uL)    Monocytes Relative 6  3 - 12 (%)    Monocytes Absolute 0.6  0.1 - 1.0 (K/uL)    Eosinophils Relative 3  0 - 5 (%)    Eosinophils Absolute 0.3  0.0 - 0.7 (K/uL)    Basophils Relative 0  0 - 1 (%)    Basophils Absolute 0.0  0.0 - 0.1 (K/uL)   COMPREHENSIVE METABOLIC PANEL     Status: Abnormal   Collection Time   04/13/11  9:52 AM      Component Value Range Comment   Sodium 142  135 - 145 (mEq/L)    Potassium 4.2  3.5 - 5.1 (mEq/L)    Chloride 105  96 - 112 (mEq/L)    CO2 26  19 - 32 (mEq/L)    Glucose, Bld 96  70 - 99 (mg/dL)    BUN 18  6 - 23 (mg/dL)    Creatinine, Ser 4.09  0.50 - 1.10 (mg/dL)    Calcium 81.1  8.4 - 10.5 (mg/dL)    Total Protein 7.2  6.0 - 8.3 (g/dL)    Albumin 3.8  3.5 - 5.2 (g/dL)    AST 19  0 - 37 (U/L)    ALT 9  0 - 35 (U/L)    Alkaline Phosphatase 84  39 - 117 (U/L)    Total Bilirubin 0.5  0.3 - 1.2 (mg/dL)    GFR calc non Af Amer 57 (*) >90 (mL/min)    GFR calc Af Amer 66 (*) >90 (mL/min)   APTT     Status: Normal   Collection Time   04/13/11  9:52 AM      Component Value Range Comment   aPTT 31  24 - 37 (seconds)   PROTIME-INR     Status: Normal   Collection Time   04/13/11  9:52 AM      Component Value Range Comment   Prothrombin Time 13.6  11.6 - 15.2 (seconds)    INR 1.02  0.00 - 1.49    TROPONIN I     Status: Normal   Collection Time   04/13/11  9:53 AM      Component  Value Range Comment   Troponin I <0.30  <0.30 (ng/mL)    Dg Chest Port 1 View  04/13/2011  *RADIOLOGY REPORT*  Clinical Data: Chest pain and shortness of breath.  PORTABLE CHEST - 1 VIEW  Comparison: Chest x-ray 02/27/2005.  Findings: Lung volumes are normal.  No consolidative airspace disease.  No pleural effusions.  Pulmonary venous congestion without frank pulmonary edema.  Heart size is within normal limits. The patient is rotated to the left on today's exam, resulting in distortion of the mediastinal contours and reduced diagnostic sensitivity and specificity for mediastinal pathology.  IMPRESSION: 1.  Mild pulmonary venous congestion without frank pulmonary edema.  Original Report Authenticated By: Florencia Reasons, M.D.   ROS All systems reviewed and negative except as noted in the HPI.  PE  Blood pressure 116/90, pulse 132, temperature 98.5 F (36.9 C), temperature source Oral, resp. rate 17, SpO2 98.00%. Well appearing NAD HEENT: Unremarkable Neck:  No JVD, no thyromegally Lungs:  Clear with no wheezes, rales or rhonchi HEART:  IRegular rate rhythm, no murmurs, no rubs, no clicks Abd:  Obese, positive bowel sounds, no organomegally, no rebound, no guarding Ext:  2 plus pulses, no edema, no cyanosis, no clubbing Skin:  No rashes no nodules Neuro:  CN II through XII intact, motor grossly intact  EKG: atrial fib with an RVR  Assessment/Plan 1. New onset atrial fib - will plan to admit, rule out, rate control with IV cardizem to start and start Pradaxa. Would consider TEE guided DCCV if she does  not rule out on Monday pm or Tues a.m. 2. HTN - her pressure is controlled. Will follow. 3. Acute diastolic CHF due to new onset atrial fib - her symptoms are fairly well controlled. Her dyspnea is improved on iv cardizem.  Lewayne Bunting 04/13/2011, 1:28 PM

## 2011-04-13 NOTE — ED Notes (Signed)
Per EMS: pt from home c/o substernal CP starting this am with SOB; pt denies diaphoresis; pt noted to be in irregular rhythm with a rate of 120-140bpm; pt given 324mg  ASA and 1 SL nitro without relief; IV 20g L AC; pt with hx of CVA

## 2011-04-13 NOTE — ED Provider Notes (Signed)
History     CSN: [id number]  Arrival date & time 04/13/11  0840   First MD Initiated Contact with Patient 04/13/11 0845      Chief Complaint  Patient presents with  . Chest Pain  . Irregular Heart Beat    (Consider location/radiation/quality/duration/timing/severity/associated sxs/prior treatment) HPI  Patient relates about 7 AM she was walking into her living room and had acute onset of pain in the center of her chest. She denies radiation. She states it feels like a pressure. She had shortness of breath and diaphoresis. She states she had nausea without vomiting. She states she was aware of her heart racing but denies it racing at this time. She called EMS and was told to take 4 baby aspirin and EMS gave her one nitroglycerin sublingually which she states has helped her discomfort. She relates her pain is improved but she still has minor discomfort. She denies having irregular heartbeat in the past. 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 She is followed by Dr. Tedra Senegal with [facility]. She denies prior heart attack.  PVP Dr. Andi Devon Cardiologist Dr. Tedra Senegal with [facility]   Past Medical History  Diagnosis Date  . Hypertension   . Hypercholesteremia   . Obesity   . Impaired glucose tolerance   . Anxiety disorder   . Gastritis   . Osteoarthritis   . Heart murmur     from age 32 associated with some hemoptysis  . H/O: hysterectomy   BB to the left eye when 71 years old Parkinson's   Past Surgical History  Procedure Date  . Carpal tunnel release     right  . Tonsillectomy   . Eye surgery     left  . Replacement total knee     right    Family History  Problem Relation Age of Onset  . Cancer Mother 38    died  . Heart attack Father 31    died    History  Substance Use Topics  . Smoking status: Never Smoker   . Smokeless tobacco: Not on file  . Alcohol Use: No  Lives with son at home Uses a walker  OB History    Grav Para  Term Preterm Abortions TAB SAB Ect Mult Living                  Review of Systems  All other systems reviewed and are negative.    Allergies  Sulfonamide derivatives  Home Medications   Current Outpatient Rx  Name Route Sig Dispense Refill  . AMLODIPINE BESY-BENAZEPRIL HCL 5-20 MG PO CAPS Oral Take 1 capsule by mouth at bedtime.    . ATORVASTATIN CALCIUM 20 MG PO TABS Oral Take 20 mg by mouth every morning.    Marland Kitchen METOPROLOL TARTRATE 50 MG PO TABS Oral Take 25 mg by mouth 2 (two) times daily.    Marland Kitchen EYE DROPS OP Ophthalmic Apply 1 drop to eye at bedtime.      BP 135/96  Pulse 125  Temp(Src) 98.5 F (36.9 C) (Oral)  Resp 13  SpO2 100%  Vital signs normal except tachycardia   Physical Exam  Nursing note and vitals reviewed. Constitutional: She is oriented to person, place, and time. She appears well-developed and well-nourished.  Non-toxic appearance. She does not appear ill. No distress.  HENT:  Head: Normocephalic and atraumatic.  Right Ear: External ear normal.  Left Ear: External ear normal.  Nose: Nose normal.  No mucosal edema or rhinorrhea.  Mouth/Throat: Oropharynx is clear and moist and mucous membranes are normal. No dental abscesses or uvula swelling.  Eyes: Conjunctivae are normal.       Patient's left eye is sunken and her eyelid is kept closed.  Neck: Normal range of motion and full passive range of motion without pain. Neck supple.  Cardiovascular: An irregularly irregular rhythm present. Tachycardia present.        Patient's heart rate varies from from 120-155  Pulmonary/Chest: Effort normal. No respiratory distress. She has no wheezes. She has no rhonchi. She has no rales. She exhibits no tenderness and no crepitus.       Mild diffuse diminished breath sounds  Abdominal: Soft. Normal appearance and bowel sounds are normal. She exhibits no distension. There is no tenderness. There is no rebound and no guarding.  Musculoskeletal: Normal range of motion. She  exhibits no edema and no tenderness.       Moves all extremities well.   Neurological: She is alert and oriented to person, place, and time. She has normal strength. No cranial nerve deficit.  Skin: Skin is warm, dry and intact. No rash noted. No erythema. No pallor.  Psychiatric: She has a normal mood and affect. Her speech is normal and behavior is normal. Her mood appears not anxious.    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 This is from a study in 2007. At that point she had a normal left ventricular ejection fraction and also no significant coronary artery disease.  I cannot find prior hx of A fib looking briefly at recent cardiology notes.   Pt started on cardizem bolus and drip. Recheck at 11:25 shows HR now in 120's, states she is feeling better.   11:47 Dr Ladona Ridgel will come see patient.    Results for orders placed during the hospital encounter of 04/13/11  CBC      Component Value Range   WBC 9.8  4.0 - 10.5 (K/uL)   RBC 5.56 (*) 3.87 - 5.11 (MIL/uL)   Hemoglobin 14.8  12.0 - 15.0 (g/dL)   HCT 16.1 (*) 09.6 - 46.0 (%)   MCV 82.9  78.0 - 100.0 (fL)   MCH 26.6  26.0 - 34.0 (pg)   MCHC 32.1  30.0 - 36.0 (g/dL)   RDW 04.5  40.9 - 81.1 (%)   Platelets 152  150 - 400 (K/uL)  DIFFERENTIAL      Component Value Range   Neutrophils Relative 76  43 - 77 (%)   Neutro Abs 7.4  1.7 - 7.7 (K/uL)   Lymphocytes Relative 15  12 - 46 (%)   Lymphs Abs 1.5  0.7 - 4.0 (K/uL)   Monocytes Relative 6  3 - 12 (%)   Monocytes Absolute 0.6  0.1 - 1.0 (K/uL)   Eosinophils Relative 3  0 - 5 (%)   Eosinophils Absolute 0.3  0.0 - 0.7 (K/uL)   Basophils Relative 0  0 - 1 (%)   Basophils Absolute 0.0  0.0 - 0.1 (K/uL)  COMPREHENSIVE METABOLIC PANEL      Component Value Range   Sodium 142  135 - 145 (mEq/L)    Potassium 4.2  3.5 - 5.1 (mEq/L)   Chloride 105  96 - 112 (mEq/L)   CO2 26  19 -  32 (mEq/L)   Glucose, Bld 96  70 - 99 (mg/dL)   BUN 18  6 - 23 (mg/dL)   Creatinine, Ser 1.61  0.50 - 1.10 (mg/dL)   Calcium 09.6  8.4 - 10.5 (mg/dL)   Total Protein 7.2  6.0 - 8.3 (g/dL)   Albumin 3.8  3.5 - 5.2 (g/dL)   AST 19  0 - 37 (U/L)   ALT 9  0 - 35 (U/L)   Alkaline Phosphatase 84  39 - 117 (U/L)   Total Bilirubin 0.5  0.3 - 1.2 (mg/dL)   GFR calc non Af Amer 57 (*) >90 (mL/min)   GFR calc Af Amer 66 (*) >90 (mL/min)  APTT      Component Value Range   aPTT 31  24 - 37 (seconds)  PROTIME-INR      Component Value Range   Prothrombin Time 13.6  11.6 - 15.2 (seconds)   INR 1.02  0.00 - 1.49   TROPONIN I      Component Value Range   Troponin I <0.30  <0.30 (ng/mL)   Laboratory interpretation all normal    Dg Chest Port 1 View  04/13/2011  *RADIOLOGY REPORT*  Clinical Data: Chest pain and shortness of breath.  PORTABLE CHEST - 1 VIEW  Comparison: Chest x-ray 02/27/2005.  Findings: Lung volumes are normal.  No consolidative airspace disease.  No pleural effusions.  Pulmonary venous congestion without frank pulmonary edema.  Heart size is within normal limits. The patient is rotated to the left on today's exam, resulting in distortion of the mediastinal contours and reduced diagnostic sensitivity and specificity for mediastinal pathology.  IMPRESSION: 1.  Mild pulmonary venous congestion without frank pulmonary edema.  Original Report Authenticated By: Florencia Reasons, M.D.       Date: 04/13/2011  Rate: 132  Rhythm: atrial fibrillation  QRS Axis: left  Intervals: normal  ST/T Wave abnormalities: nonspecific T wave changes  Conduction Disutrbances:none  Narrative Interpretation: borderline prolonged QTI  Old EKG Reviewed: changes noted from 02/28/2005 was in NSR     1. Atrial fibrillation with rapid ventricular response   2. Chest pain    Plan admission  Devoria Albe, MD,  FACEP    MDM          Ward Givens, MD 04/13/11 [id number]

## 2011-04-13 NOTE — ED Notes (Signed)
Old and new ekg given to edp

## 2011-04-14 ENCOUNTER — Other Ambulatory Visit: Payer: Self-pay

## 2011-04-14 DIAGNOSIS — I4891 Unspecified atrial fibrillation: Secondary | ICD-10-CM

## 2011-04-14 LAB — BASIC METABOLIC PANEL WITH GFR
BUN: 19 mg/dL (ref 6–23)
CO2: 24 meq/L (ref 19–32)
Calcium: 9.3 mg/dL (ref 8.4–10.5)
Chloride: 107 meq/L (ref 96–112)
Creatinine, Ser: 0.89 mg/dL (ref 0.50–1.10)
GFR calc Af Amer: 73 mL/min — ABNORMAL LOW
GFR calc non Af Amer: 63 mL/min — ABNORMAL LOW
Glucose, Bld: 105 mg/dL — ABNORMAL HIGH (ref 70–99)
Potassium: 4.1 meq/L (ref 3.5–5.1)
Sodium: 142 meq/L (ref 135–145)

## 2011-04-14 LAB — CBC
HCT: 42.6 % (ref 36.0–46.0)
Hemoglobin: 13.8 g/dL (ref 12.0–15.0)
MCH: 26.5 pg (ref 26.0–34.0)
MCHC: 32.4 g/dL (ref 30.0–36.0)
MCV: 81.9 fL (ref 78.0–100.0)
Platelets: 270 10*3/uL (ref 150–400)
RBC: 5.2 MIL/uL — ABNORMAL HIGH (ref 3.87–5.11)
RDW: 14.8 % (ref 11.5–15.5)
WBC: 12.2 10*3/uL — ABNORMAL HIGH (ref 4.0–10.5)

## 2011-04-14 LAB — CARDIAC PANEL(CRET KIN+CKTOT+MB+TROPI)
CK, MB: 2.7 ng/mL (ref 0.3–4.0)
CK, MB: 2.8 ng/mL (ref 0.3–4.0)
Relative Index: INVALID (ref 0.0–2.5)
Relative Index: INVALID (ref 0.0–2.5)
Total CK: 90 U/L (ref 7–177)
Total CK: 91 U/L (ref 7–177)
Troponin I: <0.3 ng/mL (ref <0.30)
Troponin I: <0.3 ng/mL

## 2011-04-14 MED ORDER — ACETAMINOPHEN 325 MG PO TABS: 650.0000 mg | ORAL_TABLET | Freq: Four times a day (QID) | ORAL | Status: DC | PRN

## 2011-04-14 MED ORDER — DILTIAZEM HCL 100 MG IV SOLR
5.0000 mg/h | INTRAVENOUS | Status: AC
  Administered 2011-04-14: 10 mg/h via INTRAVENOUS
  Filled 2011-04-14: qty 100

## 2011-04-14 MED ORDER — ATORVASTATIN CALCIUM 10 MG PO TABS
10.0000 mg | ORAL_TABLET | Freq: Every day | ORAL | Status: DC
  Administered 2011-04-14: 10 mg via ORAL
  Filled 2011-04-14 (×2): qty 1

## 2011-04-14 MED ORDER — DILTIAZEM HCL ER 60 MG PO CP12
120.0000 mg | ORAL_CAPSULE | Freq: Two times a day (BID) | ORAL | Status: DC
  Administered 2011-04-14 – 2011-04-15 (×2): 120 mg via ORAL
  Filled 2011-04-14 (×4): qty 2

## 2011-04-14 NOTE — Progress Notes (Signed)
  Echocardiogram 2D Echocardiogram has been performed.  [name], Real Cons 04/14/2011, 5:05 PM

## 2011-04-14 NOTE — Progress Notes (Signed)
Cardiology  Night coverage  Asked by Cardiology PA to evaluate patient.  Not seen on rounds today by attending.  Patient converted to normal sinus rhythm at 8:13 am.  No c/o SOB, No chest pain.  Enzymes negative.  Objective:  Vital Signs in the last 24 hours: BP 131/77  Pulse 67  Temp(Src) 98.4 F (36.9 C) (Oral)  Resp 16  Ht 5' (1.524 m)  Wt 78.382 kg (172 lb 12.8 oz)  BMI 33.75 kg/m2  SpO2 97%  Physical Exam: Pleasant BF in NAD Lungs:  Clear to A&P Cardiac:  Regular rhythm, normal S1 and S2, no S3 Extremities:  No edema present  Intake/Output from previous day: 03/09 0701 - 03/10 0700 In: 240 [P.O.:240] Out: 550 [Urine:550]  Lab Results: Basic Metabolic Panel:  Basename 04/14/11 0046 04/13/11 0952  NA 142 142  K 4.1 4.2  CL 107 105  CO2 24 26  GLUCOSE 105* 96  BUN 19 18  CREATININE 0.89 0.96   CBC:  Basename 04/14/11 0046 04/13/11 0952  WBC 12.2* 9.8  NEUTROABS -- 7.4  HGB 13.8 14.8  HCT 42.6 46.1*  MCV 81.9 82.9  PLT 270 152   Cardiac Enzymes:  Basename 04/14/11 0946 04/14/11 0046 04/13/11 1638  CKTOTAL 91 90 126  CKMB 2.8 2.7 3.3  CKMBINDEX -- -- --  TROPONINI <0.30 <0.30 <0.30    Protime: . Lab Results  Component Value Date   INR 1.02 04/13/2011    Telemetry: Reviewed  Currently in NSR  Assessment/Plan:  1. Atrial fibrillation converted to normal sinus rhtyhm 2. CAD, MI r/o out 3. Hypertension controlled  REC:  Review ECHO done today. Change to po diltiazem.    Darden Palmer.  MD [facility] 04/14/2011, 8:54 PM

## 2011-04-15 ENCOUNTER — Encounter (HOSPITAL_COMMUNITY): Payer: Self-pay | Admitting: Nurse Practitioner

## 2011-04-15 DIAGNOSIS — I480 Paroxysmal atrial fibrillation: Secondary | ICD-10-CM | POA: Insufficient documentation

## 2011-04-15 DIAGNOSIS — I5030 Unspecified diastolic (congestive) heart failure: Secondary | ICD-10-CM | POA: Insufficient documentation

## 2011-04-15 MED ORDER — DABIGATRAN ETEXILATE MESYLATE 150 MG PO CAPS: 150.0000 mg | ORAL_CAPSULE | Freq: Two times a day (BID) | ORAL | Status: DC

## 2011-04-15 MED ORDER — DILTIAZEM HCL ER 120 MG PO CP12: 120.0000 mg | ORAL_CAPSULE | Freq: Two times a day (BID) | ORAL | Status: DC

## 2011-04-15 NOTE — Discharge Summary (Signed)
Patient ID: [name],  MRN: [id number], DOB/AGE: 1940/08/05 71 y.o.  Admit date: 04/13/2011 Discharge date: 04/15/2011  Primary Care Provider: Alva Garnet., MD Primary Cardiologist: T. Riley Kill, MD  Discharge Diagnoses Principal Problem:  *Atrial fibrillation Active Problems:  HYPERCHOLESTEROLEMIA  HYPERTENSION, UNSPECIFIED  OBESITY  ANXIETY DISORDER  CAD  OSTEOARTHRITIS  Diastolic CHF   Allergies Allergies  Allergen Reactions  . Sulfonamide Derivatives     Unknown.    Procedures  2D Echocardiogram 04/14/2011  Study Conclusions  - Left ventricle: The cavity size was normal. Wall thickness   was increased in a pattern of moderate LVH. Systolic   function was normal. The estimated ejection fraction was   in the range of 60% to 65%. Regional wall motion   abnormalities cannot be excluded. - Left atrium: The atrium was mildly dilated.  History of Present Illness  24 -year-old female with the above problem list who was in her usual state of health until the morning of admission when she awoke with substernal chest pressure in terms of breath. She presented to the [facility] ED where she was found to be in atrial fibrillation with rates in the 130. She was felt to have mild volume overload on exam.   She was placed on intravenous diltiazem and admitted for further evaluation.  Hospital Course  Patient ruled out for myocardial infarction. With rate control, symptoms of chest discomfort and dyspnea resolved. In light of her CHADS2 score of 3, she was placed on Pradaxa therapy for a long-term anticoagulation. Patient converted to sinus rhythm on the morning of March 10. She's had no recurrence of atrial fibrillation. We have arranged for followup in approximately 2 weeks.   Discharge Vitals Blood pressure 132/65, pulse 75, temperature 98.4 F (36.9 C), temperature source Oral, resp. rate 18, height 5' (1.524 m), weight 169 lb 1.5 oz (76.7 kg), SpO2 94.00%.  Filed  Weights   04/13/11 1745 04/14/11 0434 04/15/11 0500  Weight: 171 lb 1.6 oz (77.61 kg) 172 lb 12.8 oz (78.382 kg) 169 lb 1.5 oz (76.7 kg)   Labs  CBC  Basename 04/14/11 0046 04/13/11 0952  WBC 12.2* 9.8  NEUTROABS -- 7.4  HGB 13.8 14.8  HCT 42.6 46.1*  MCV 81.9 82.9  PLT 270 152   Basic Metabolic Panel  Basename 04/14/11 0046 04/13/11 0952  NA 142 142  K 4.1 4.2  CL 107 105  CO2 24 26  GLUCOSE 105* 96  BUN 19 18  CREATININE 0.89 0.96  CALCIUM 9.3 10.1  MG -- --  PHOS -- --   Liver Function Tests  [facility] 04/13/11 0952  AST 19  ALT 9  ALKPHOS 84  BILITOT 0.5  PROT 7.2  ALBUMIN 3.8   Cardiac Enzymes  Basename 04/14/11 0946 04/14/11 0046 04/13/11 1638  CKTOTAL 91 90 126  CKMB 2.8 2.7 3.3  CKMBINDEX -- -- --  TROPONINI <0.30 <0.30 <0.30   Disposition  Pt is being discharged home today in good condition.  Follow-up Plans & Appointments  Follow-up Information    Follow up with Alva Garnet., MD. (as scheduled)       Follow up with Tereso Newcomer, PA on 04/26/2011. (10:30 AM - Dr. Rosalyn Charters PA)    Contact information:   [address] [place] [address] [phone]          Discharge Medications  Medication List  As of 04/15/2011  2:57 PM   STOP taking these medications  amLODipine-benazepril 5-20 MG per capsule         TAKE these medications         atorvastatin 20 MG tablet   Commonly known as: LIPITOR   Take 20 mg by mouth every morning.      dabigatran 150 MG Caps   Commonly known as: PRADAXA   Take 1 capsule (150 mg total) by mouth every 12 (twelve) hours.      diltiazem 120 MG 12 hr capsule   Commonly known as: CARDIZEM SR   Take 1 capsule (120 mg total) by mouth every 12 (twelve) hours.      EYE DROPS OP   Apply 1 drop to eye at bedtime.      metoprolol 50 MG tablet   Commonly known as: LOPRESSOR   Take 25 mg by mouth 2 (two) times daily.            Outstanding  Labs/Studies  None  Duration of Discharge Encounter   Greater than 30 minutes including physician time.  Signed, Nicolasa Ducking NP 04/15/2011, 2:57 PM   Patient seen and examined.  Plan as discussed in my rounding note for today and outlined above. Fayrene Fearing [facility]  04/15/2011  3:06 PM

## 2011-04-15 NOTE — Discharge Instructions (Signed)
***  PLEASE REMEMBER TO BRING ALL OF YOUR MEDICATIONS TO EACH OF YOUR FOLLOW-UP OFFICE VISITS.  

## 2011-04-15 NOTE — Progress Notes (Signed)
UR Completed. Simmons, Fabiana Dromgoole F [phone]  

## 2011-04-15 NOTE — Progress Notes (Signed)
Patient Name: [name] Date of Encounter: 04/15/2011     Principal Problem:  *Atrial fibrillation Active Problems:  HYPERCHOLESTEROLEMIA  HYPERTENSION, UNSPECIFIED  OBESITY  ANXIETY DISORDER  CAD  OSTEOARTHRITIS  Diastolic CHF  h/o CVA  SUBJECTIVE  Pt denies CP. No SOB or palpitations. Denies dizziness at rest or with standing.  Hasn't ambulated b/c she's been on bedrest.  Uses a walker to get around @ home.  CURRENT MEDS    . atorvastatin  10 mg Oral q1800  . dabigatran  150 mg Oral Q12H  . diltiazem  120 mg Oral Q12H  . metoprolol  25 mg Oral BID  . sodium chloride  3 mL Intravenous Q12H  . tetrahydrozoline  1 drop Both Eyes BID  . DISCONTD: simvastatin  20 mg Oral q1800    OBJECTIVE  Filed Vitals:   04/14/11 0434 04/14/11 1502 04/14/11 2113 04/15/11 0500  BP: 115/73 131/77 154/83 124/75  Pulse: 74 67 74 100  Temp: 98.7 F (37.1 C) 98.4 F (36.9 C) 98.4 F (36.9 C) 98.3 F (36.8 C)  TempSrc: Oral Oral Oral Oral  Resp: 17 16 18 22   Height:      Weight: 172 lb 12.8 oz (78.382 kg)   169 lb 1.5 oz (76.7 kg)  SpO2: 96% 97% 98% 95%    Intake/Output Summary (Last 24 hours) at 04/15/11 1242 Last data filed at 04/15/11 0800  Gross per 24 hour  Intake    600 ml  Output    100 ml  Net    500 ml   Filed Weights   04/13/11 1745 04/14/11 0434 04/15/11 0500  Weight: 171 lb 1.6 oz (77.61 kg) 172 lb 12.8 oz (78.382 kg) 169 lb 1.5 oz (76.7 kg)    PHYSICAL EXAM  General: Pleasant, NAD. Neuro: Alert and oriented X 3. Moves all extremities spontaneously. Psych: Normal affect. HEENT:  Normal except left eye ptosis.  Neck: Supple without bruits or JVD. Lungs:  Resp regular and unlabored, CTA. Heart: RRR no s3, s4, or murmurs. Abdomen: Soft, non-tender, non-distended, BS + x 4.  Extremities: No clubbing or cyanosis, trace - 1+ bilat ankle/pedal edema. DP/PT 1+, Radials 2+ and equal bilaterally.  Accessory Clinical Findings  CBC  Basename 04/14/11  0046 04/13/11 0952  WBC 12.2* 9.8  NEUTROABS -- 7.4  HGB 13.8 14.8  HCT 42.6 46.1*  MCV 81.9 82.9  PLT 270 152   Basic Metabolic Panel  Basename 04/14/11 0046 04/13/11 0952  NA 142 142  K 4.1 4.2  CL 107 105  CO2 24 26  GLUCOSE 105* 96  BUN 19 18  CREATININE 0.89 0.96  CALCIUM 9.3 10.1  MG -- --  PHOS -- --   Liver Function Tests  [facility] 04/13/11 0952  AST 19  ALT 9  ALKPHOS 84  BILITOT 0.5  PROT 7.2  ALBUMIN 3.8    Cardiac Enzymes  Basename 04/14/11 0946 04/14/11 0046 04/13/11 1638  CKTOTAL 91 90 126  CKMB 2.8 2.7 3.3  CKMBINDEX -- -- --  TROPONINI <0.30 <0.30 <0.30    TELE  NSR, rate 81  ECHO  -Left ventricle: The cavity size was normal. Wall thickness was increased in a pattern of moderate LVH. Systolic function was normal. The estimated ejection fraction was in the range of 60% to 65%. Regional wall motion abnormalities cannot be excluded. - Left atrium: The atrium was mildly dilated.  ASSESSMENT AND PLAN  1. Atrial fibrillation:  Pt remains in normal sinus rhythm,  asymptomatic. CHADS2: 4 (htn, cva, diast chf)  Continue Diltiazem/bb and Pradaxa. 2. CAD:  MI ruled out, no chest pain. Continue statin, bb.  No acute st/t changes in setting of c/p & afib on admission.  F/U Dr. Riley Kill. 3.  Acute on chronic diast chf:  In setting of afib.  Volume looks ok.  Weights stable.  She has previously required outpt diuretics but came off @ some point to avoid polyuria.  HR/BP stable. 4. HTN:  well-controlled, continue bb/dilt. 5.  H/O CVA:  Ambulate this am.  She uses a walker @ home and lives with her son. 6.  Dispo:  Ambulate and d/c.   Signed, Nicolasa Ducking NP  History reviewed with the patient, no changes to be made.  The patient exam reveals NSR, Lungs:  Clear, Abd Positive bowel sounds, no rebound no guarding.  Ext:  No edema  All available labs, radiology testing, previous records reviewed. Agree with documented assessment and plan.  OK to  discharge with plans for follow up with Dr. Riley Kill or extender in the next 2 weeks.  OK for Pradaxa.   Fayrene Fearing [facility] Seaman  2:30 PM 12/21/2010

## 2011-04-26 ENCOUNTER — Encounter: Payer: Self-pay | Admitting: *Deleted

## 2011-04-26 ENCOUNTER — Encounter (HOSPITAL_COMMUNITY): Payer: Self-pay | Admitting: Pharmacy Technician

## 2011-04-26 ENCOUNTER — Ambulatory Visit (INDEPENDENT_AMBULATORY_CARE_PROVIDER_SITE_OTHER): Payer: Medicare Other | Admitting: Physician Assistant

## 2011-04-26 ENCOUNTER — Encounter: Payer: Self-pay | Admitting: Physician Assistant

## 2011-04-26 DIAGNOSIS — I10 Essential (primary) hypertension: Secondary | ICD-10-CM

## 2011-04-26 DIAGNOSIS — R079 Chest pain, unspecified: Secondary | ICD-10-CM | POA: Insufficient documentation

## 2011-04-26 DIAGNOSIS — I4891 Unspecified atrial fibrillation: Secondary | ICD-10-CM

## 2011-04-26 DIAGNOSIS — I5030 Unspecified diastolic (congestive) heart failure: Secondary | ICD-10-CM

## 2011-04-26 DIAGNOSIS — R0602 Shortness of breath: Secondary | ICD-10-CM

## 2011-04-26 DIAGNOSIS — I2510 Atherosclerotic heart disease of native coronary artery without angina pectoris: Secondary | ICD-10-CM

## 2011-04-26 LAB — CBC WITH DIFFERENTIAL/PLATELET
Basophils Absolute: 0 10*3/uL (ref 0.0–0.1)
Basophils Relative: 0.2 % (ref 0.0–3.0)
Eosinophils Absolute: 0.2 10*3/uL (ref 0.0–0.7)
Eosinophils Relative: 2.1 % (ref 0.0–5.0)
HCT: 43.2 % (ref 36.0–46.0)
Hemoglobin: 14.1 g/dL (ref 12.0–15.0)
Lymphocytes Relative: 21.9 % (ref 12.0–46.0)
Lymphs Abs: 2.5 10*3/uL (ref 0.7–4.0)
MCHC: 32.7 g/dL (ref 30.0–36.0)
MCV: 82.7 fl (ref 78.0–100.0)
Monocytes Absolute: 0.5 10*3/uL (ref 0.1–1.0)
Monocytes Relative: 4.6 % (ref 3.0–12.0)
Neutro Abs: 8 10*3/uL — ABNORMAL HIGH (ref 1.4–7.7)
Neutrophils Relative %: 71.2 % (ref 43.0–77.0)
Platelets: 282 10*3/uL (ref 150.0–400.0)
RBC: 5.23 Mil/uL — ABNORMAL HIGH (ref 3.87–5.11)
RDW: 14.7 % — ABNORMAL HIGH (ref 11.5–14.6)
WBC: 11.3 10*3/uL — ABNORMAL HIGH (ref 4.5–10.5)

## 2011-04-26 LAB — PROTIME-INR
INR: 1.2 ratio — ABNORMAL HIGH (ref 0.8–1.0)
Prothrombin Time: 13.1 s — ABNORMAL HIGH (ref 10.2–12.4)

## 2011-04-26 LAB — BASIC METABOLIC PANEL
BUN: 20 mg/dL (ref 6–23)
CO2: 25 mEq/L (ref 19–32)
Calcium: 10.1 mg/dL (ref 8.4–10.5)
Chloride: 104 mEq/L (ref 96–112)
Creatinine, Ser: 0.8 mg/dL (ref 0.4–1.2)
GFR: 86.61 mL/min (ref >60.00)
Glucose, Bld: 86 mg/dL (ref 70–99)
Potassium: 4.1 mEq/L (ref 3.5–5.1)
Sodium: 140 mEq/L (ref 135–145)

## 2011-04-26 LAB — BRAIN NATRIURETIC PEPTIDE: Pro B Natriuretic peptide (BNP): 47 pg/mL (ref 0.0–100.0)

## 2011-04-26 MED ORDER — BENAZEPRIL HCL 20 MG PO TABS: 20.0000 mg | ORAL_TABLET | Freq: Every day | ORAL | Status: DC

## 2011-04-26 NOTE — Progress Notes (Signed)
[address] [place], [place]  [address] Phone: [phone] Fax:  [phone]  Date:  04/26/2011   Name:  [name]       DOB:  02-22-1940 MRN:  [id number]  PCP:  Dr. Renae Gloss Primary Cardiologist:  Dr.  [name]  Primary Electrophysiologist:  None    History of Present Illness: [name] is a 71 y.o. female who presents for post hospital follow up.  She has a history of CAD, hypertension, hyperlipidemia and glucose intolerance.  LHC 02/2005: EF 70%, circumflex marginal 50%, ostial LAD 60%, 30% proximal LAD.  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  She was admitted 3/953/11 with atrial fibrillation with rapid ventricular rate.  She was mildly volume overloaded in the setting of this.  She was placed on IV diltiazem and converted to sinus rhythm.  She was placed on Pradaxa due to her thromboembolic risk factor profile.  Labs: Hemoglobin 13.8, potassium 4.1, creatinine 0.9, ALT 9, crit markers negative x3.  Echocardiogram 04/14/11: Moderate LVH, EF 60-65%, mild LAE.  She is stable.  She was supposed to stop Lotrel and remain on diltiazem bid.  She does note chest pain that continues.  She presented to the hospital with chest pain in the setting of AFib.  She is not that active. She only notes chest pain with ADLs such as dressing.  No radiation.  She does notes assoc dyspnea and diaph.  No nausea.  No syncope.  No orthopnea, PND.  LE edema stable.    Past Medical History  Diagnosis Date  . Coronary artery disease   . Hypertension   . Hypercholesteremia   . Obesity   . Impaired glucose tolerance   . Anxiety disorder   . Gastritis   . Osteoarthritis   . Heart murmur     from age 77 associated with some hemoptysis  .  H/O: hysterectomy   . Atrial fibrillation     a. new 04/2011  . Diastolic CHF     a. NL EF by echo 04/2011  . CVA (cerebral infarction)     Current Outpatient Prescriptions  Medication Sig Dispense Refill  . amLODipine-benazepril (LOTREL) 5-20 MG per capsule Take 1 capsule by mouth daily.       Marland Kitchen aspirin 81 MG tablet Take 81 mg by mouth daily.      Marland Kitchen atorvastatin (LIPITOR) 20 MG tablet Take 20 mg by mouth every morning.      . citalopram (CELEXA) 10 MG tablet Take 10 mg by mouth daily.       . dabigatran (PRADAXA) 150 MG CAPS Take 1 capsule (150 mg total) by mouth every 12 (twelve) hours.  60 capsule  6  . DETROL LA 4 MG 24 hr capsule Take 4 mg by mouth daily.       Marland Kitchen diltiazem (CARDIZEM SR) 120 MG 12 hr capsule Take 1 capsule (120 mg total) by mouth every 12 (twelve) hours.  30 capsule  6  . metoprolol (LOPRESSOR) 50 MG tablet Take 25 mg by mouth 2 (two) times daily.      . Tetrahydrozoline HCl (EYE DROPS OP) Apply 1 drop to eye at bedtime.        Allergies: Allergies  Allergen Reactions  . Sulfonamide Derivatives  Unknown.    History  Substance Use Topics  . Smoking status: Never Smoker   . Smokeless tobacco: Not on file  . Alcohol Use: No     ROS:  Please see the history of present illness.   All other systems reviewed and negative.   PHYSICAL EXAM: VS:  BP 134/68  Pulse 69  Wt 174 lb (78.926 kg) Well nourished, well developed, in no acute distress HEENT: normal Neck: no JVD at 90 degrees Cardiac:  normal S1, S2; RRR; no murmur Lungs:  clear to auscultation bilaterally, no wheezing, rhonchi or rales Abd: soft, nontender, no hepatomegaly Ext: trace bilat edema Skin: warm and dry Neuro:  CNs 2-12 intact, no focal abnormalities noted  EKG:  Sinus rhythm, heart rate 69, normal axis, no acute changes  ASSESSMENT AND PLAN:  1. Chest pain  Symptoms concerning for angina.  D/w Dr.  [name].  Will arrange cardiac cath.  Risks and benefits of cardiac  catheterization have been discussed with the patient.  These include bleeding, infection, kidney damage, stroke, heart attack, death.  The patient understands these risks and is willing to proceed.  Hold Pradaxa 3 days prior.  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  Follow up after cath.   2. Atrial fibrillation  Maintaining NSR.  Continue Pradaxa.  She is unsteady at times.  We had a long discussion about using caution.  We will need to monitor her safety for Pradaxa over time.   3. CAD  Arrange cath as noted.  Continue ASA.   4. Diastolic CHF  No clear volume overload today.  Will check bmet and bnp.  Add lasix if needed.   5. HYPERTENSION, UNSPECIFIED  She was supposed to stop Lotrel.  Will d/c and continue Benazepril 20 mg QD.     Luna Glasgow, PA-C  10:58 AM 04/26/2011

## 2011-04-26 NOTE — H&P (Signed)
Date:  04/26/2011     Name:  [name]                                                           DOB:  Jul 07, 1940 MRN:  [id number]   PCP:  Dr. Renae Gloss Primary Cardiologist:  Dr.  [name]      History of Present Illness: [name] is a 71 y.o. female who presents for post hospital follow up.  She has a history of CAD, hypertension, hyperlipidemia and glucose intolerance.  LHC 02/2005: EF 70%, circumflex marginal 50%, ostial LAD 60%, 30% proximal LAD.  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  She was admitted 3/953/11 with atrial fibrillation with rapid ventricular rate.  She was mildly volume overloaded in the setting of this.  She was placed on IV diltiazem and converted to sinus rhythm.  She was placed on Pradaxa due to her thromboembolic risk factor profile.  Labs: Hemoglobin 13.8, potassium 4.1, creatinine 0.9, ALT 9, crit markers negative x3.  Echocardiogram 04/14/11: Moderate LVH, EF 60-65%, mild LAE.  She is stable.  She was supposed to stop Lotrel and remain on diltiazem bid.  She does note chest pain that continues.  She presented to the hospital with chest pain in the setting of AFib.  She is not that active. She only notes chest pain with ADLs such as dressing.  No radiation.  She does notes assoc dyspnea and diaph.  No nausea.  No syncope.  No orthopnea, PND.  LE edema stable.      Past Medical History     .  Coronary artery disease     .  Hypertension     .  Hypercholesteremia     .  Obesity     .  Impaired glucose tolerance     .  Anxiety disorder     .  Gastritis     .  Osteoarthritis     .  Heart murmur         from age 58 associated with some hemoptysis   .  H/O: hysterectomy     .  Atrial fibrillation         a. new 04/2011   .   Diastolic CHF         a. NL EF by echo 04/2011   .  CVA (cerebral infarction)         Current Outpatient Prescriptions    .  amLODipine-benazepril (LOTREL) 5-20 MG per capsule  Take 1 capsule by mouth daily.          Marland Kitchen  aspirin 81 MG tablet  Take 81 mg by mouth daily.         Marland Kitchen  atorvastatin (LIPITOR) 20 MG tablet  Take 20 mg by mouth every morning.         .  citalopram (CELEXA) 10 MG tablet  Take 10 mg by mouth daily.          .  dabigatran (PRADAXA) 150 MG CAPS  Take 1 capsule (150 mg total) by mouth every  12 (twelve) hours.   60 capsule   6   .  DETROL LA 4 MG 24 hr capsule  Take 4 mg by mouth daily.          Marland Kitchen  diltiazem (CARDIZEM SR) 120 MG 12 hr capsule  Take 1 capsule (120 mg total) by mouth every 12 (twelve) hours.   30 capsule   6   .  metoprolol (LOPRESSOR) 50 MG tablet  Take 25 mg by mouth 2 (two) times daily.         .  Tetrahydrozoline HCl (EYE DROPS OP)  Apply 1 drop to eye at bedtime.           Allergies:  .  Sulfonamide Derivatives         Unknown.    Social Hx:   .  Smoking status:  Never Smoker    .  Smokeless tobacco:  Not on file   .  Alcohol Use:  No    Family History  Problem Relation Age of Onset  . Cancer Mother 59    died  . Heart attack Father 22    died    ROS:  Please see the history of present illness.   All other systems reviewed and negative.    PHYSICAL EXAM: VS:  BP 134/68  Pulse 69  Wt 174 lb (78.926 kg) Well nourished, well developed, in no acute distress  HEENT: normal  Neck: no JVD at 90 degrees Cardiac:  normal S1, S2; RRR; no murmur  Lungs:  clear to auscultation bilaterally, no wheezing, rhonchi or rales  Abd: soft, nontender, no hepatomegaly  Ext: trace bilat edema  Skin: warm and dry  Neuro:  CNs 2-12 intact, no focal abnormalities noted  EKG:  Sinus rhythm, heart rate 69, normal axis, no acute changes   ASSESSMENT AND PLAN:    1.  Chest pain    Symptoms concerning for angina.  D/w Dr.  [name].  Will arrange  cardiac cath.  Risks and benefits of cardiac catheterization have been discussed with the patient.  These include bleeding, infection, kidney damage, stroke, heart attack, death.  The patient understands these risks and is willing to proceed.  Hold Pradaxa 3 days prior.  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  Follow up after cath.    2.  Atrial fibrillation    Maintaining NSR.  Continue Pradaxa.  She is unsteady at times.  We had a long discussion about using caution.  We will need to monitor her safety for Pradaxa over time.    3.  CAD    Arrange cath as noted.  Continue ASA.    4.  Diastolic CHF    No clear volume overload today.  Will check bmet and bnp.  Add lasix if needed.    5.  HYPERTENSION, UNSPECIFIED    She was supposed to stop Lotrel.  Will d/c and continue Benazepril 20 mg QD.     Luna Glasgow, PA-C  10:58 AM 04/26/2011

## 2011-04-26 NOTE — Patient Instructions (Signed)
Your physician has requested that you have a LEFT cardiac catheterization @ THE INPT CATH LAB @ [facility] WITH DR. Riley Kill 05/01/11 @ 11:30 AM . YOU NEED TO HOLD PRADAXA 3 DAYS BEFORE YOUR PROCEDURE. Cardiac catheterization is used to diagnose and/or treat various heart conditions. Doctors may recommend this procedure for a number of different reasons. The most common reason is to evaluate chest pain. Chest pain can be a symptom of coronary artery disease (CAD), and cardiac catheterization can show whether plaque is narrowing or blocking your heart's arteries. This procedure is also used to evaluate the valves, as well as measure the blood flow and oxygen levels in different parts of your heart. For further information please visit [email or web address] Please follow instruction sheet, as given.  Your physician recommends that you return for lab work in: PRE CATH LABS BMET, CBC W/DIFF, PT/INR AND BNP   Your physician has recommended you make the following change in your medication: STOP LOTREL AND START ON BENAZEPRIL 20 MG 1 TABLET DAILY  Your physician recommends that you schedule a follow-up appointment in: 2-3 WEEKS AFTER CATH WITH DR. Riley Kill OR [facility], PAC SAME DAY DR. Riley Kill IS IN THE OFFICE

## 2011-04-29 ENCOUNTER — Other Ambulatory Visit: Payer: Self-pay | Admitting: *Deleted

## 2011-04-29 ENCOUNTER — Telehealth: Payer: Self-pay | Admitting: *Deleted

## 2011-04-29 NOTE — Telephone Encounter (Signed)
Message copied by Tarri Fuller on Mon Apr 29, 2011  9:57 AM ------      Message from: [place], [place] T      Created: Sat Apr 27, 2011  8:24 PM       White count high      Ask if she is having fevers, cough or burning with urination or a rash      Ask if she take prednisone            Other labs ok      Tereso Newcomer, PA-C  8:24 PM 04/27/2011

## 2011-04-29 NOTE — Telephone Encounter (Signed)
pt notified of lab results today. I asked pt if she had a cough or rash, or burning w/urination, or any fevers. pt states only has had a cough all winter long since she got a shot w/PCP office in the winter. Advised pt to call PCP and get appt due to elevated WBC and cough, may have possible infection. Pt gave me verbal understanding. I told pt that I will fax lab results to PCP Dr. Andi Devon today. Danielle Rankin

## 2011-05-01 ENCOUNTER — Ambulatory Visit (HOSPITAL_COMMUNITY)
Admission: RE | Admit: 2011-05-01 | Discharge: 2011-05-01 | Disposition: A | Discharge status: Home / Self Care | Payer: Medicare Other | Source: Ambulatory Visit | Attending: Cardiology | Admitting: Cardiology

## 2011-05-01 ENCOUNTER — Encounter (HOSPITAL_COMMUNITY): Payer: Self-pay | Admitting: Pharmacy Technician

## 2011-05-01 MED ORDER — SODIUM CHLORIDE 0.9 % IV SOLN: 250.0000 mL | INTRAVENOUS | Status: DC | PRN

## 2011-05-01 MED ORDER — SODIUM CHLORIDE 0.9 % IJ SOLN: 3.0000 mL | INTRAMUSCULAR | Status: DC | PRN

## 2011-05-01 MED ORDER — SODIUM CHLORIDE 0.9 % IV SOLN: INTRAVENOUS | Status: DC

## 2011-05-01 MED ORDER — ASPIRIN 81 MG PO CHEW: 324.0000 mg | CHEWABLE_TABLET | ORAL | Status: DC

## 2011-05-01 MED ORDER — SODIUM CHLORIDE 0.9 % IJ SOLN: 3.0000 mL | Freq: Two times a day (BID) | INTRAMUSCULAR | Status: DC

## 2011-05-02 ENCOUNTER — Encounter (HOSPITAL_COMMUNITY): Payer: Self-pay | Admitting: Pharmacy Technician

## 2011-05-02 ENCOUNTER — Other Ambulatory Visit: Payer: Self-pay | Admitting: Physician Assistant

## 2011-05-02 ENCOUNTER — Ambulatory Visit: Payer: BLUE CROSS/BLUE SHIELD | Admitting: Cardiology

## 2011-05-03 ENCOUNTER — Encounter (HOSPITAL_COMMUNITY)
Admission: RE | Disposition: A | Discharge status: Home / Self Care | Payer: Self-pay | Source: Ambulatory Visit | Attending: Cardiology

## 2011-05-03 ENCOUNTER — Ambulatory Visit (HOSPITAL_COMMUNITY)
Admission: RE | Admit: 2011-05-03 | Discharge: 2011-05-03 | Disposition: A | Discharge status: Home / Self Care | Payer: Medicare Other | Source: Ambulatory Visit | Attending: Cardiology | Admitting: Cardiology

## 2011-05-03 ENCOUNTER — Encounter (HOSPITAL_COMMUNITY): Payer: Self-pay | Admitting: Cardiology

## 2011-05-03 DIAGNOSIS — R7309 Other abnormal glucose: Secondary | ICD-10-CM | POA: Insufficient documentation

## 2011-05-03 DIAGNOSIS — I10 Essential (primary) hypertension: Secondary | ICD-10-CM | POA: Insufficient documentation

## 2011-05-03 DIAGNOSIS — E785 Hyperlipidemia, unspecified: Secondary | ICD-10-CM | POA: Insufficient documentation

## 2011-05-03 DIAGNOSIS — Z8673 Personal history of transient ischemic attack (TIA), and cerebral infarction without residual deficits: Secondary | ICD-10-CM | POA: Insufficient documentation

## 2011-05-03 DIAGNOSIS — Q245 Malformation of coronary vessels: Secondary | ICD-10-CM | POA: Insufficient documentation

## 2011-05-03 DIAGNOSIS — M1990 Unspecified osteoarthritis, unspecified site: Secondary | ICD-10-CM | POA: Insufficient documentation

## 2011-05-03 DIAGNOSIS — E669 Obesity, unspecified: Secondary | ICD-10-CM | POA: Insufficient documentation

## 2011-05-03 DIAGNOSIS — I4891 Unspecified atrial fibrillation: Secondary | ICD-10-CM | POA: Insufficient documentation

## 2011-05-03 DIAGNOSIS — F411 Generalized anxiety disorder: Secondary | ICD-10-CM | POA: Insufficient documentation

## 2011-05-03 DIAGNOSIS — I5030 Unspecified diastolic (congestive) heart failure: Secondary | ICD-10-CM | POA: Insufficient documentation

## 2011-05-03 DIAGNOSIS — R079 Chest pain, unspecified: Secondary | ICD-10-CM | POA: Insufficient documentation

## 2011-05-03 DIAGNOSIS — I2510 Atherosclerotic heart disease of native coronary artery without angina pectoris: Secondary | ICD-10-CM | POA: Insufficient documentation

## 2011-05-03 HISTORY — PX: LEFT HEART CATHETERIZATION WITH CORONARY ANGIOGRAM: SHX5451

## 2011-05-03 SURGERY — LEFT HEART CATHETERIZATION WITH CORONARY ANGIOGRAM
Anesthesia: LOCAL

## 2011-05-03 MED ORDER — ACETAMINOPHEN 325 MG PO TABS: 650.0000 mg | ORAL_TABLET | ORAL | Status: DC | PRN

## 2011-05-03 MED ORDER — NITROGLYCERIN 0.2 MG/ML ON CALL CATH LAB
INTRAVENOUS | Status: AC
  Filled 2011-05-03: qty 1

## 2011-05-03 MED ORDER — SODIUM CHLORIDE 0.9 % IV SOLN
INTRAVENOUS | Status: DC
  Administered 2011-05-03: 1000 mL via INTRAVENOUS

## 2011-05-03 MED ORDER — ONDANSETRON HCL 4 MG/2ML IJ SOLN: 4.0000 mg | Freq: Four times a day (QID) | INTRAMUSCULAR | Status: DC | PRN

## 2011-05-03 MED ORDER — SODIUM CHLORIDE 0.9 % IV SOLN: INTRAVENOUS | Status: DC

## 2011-05-03 MED ORDER — ASPIRIN 81 MG PO CHEW
CHEWABLE_TABLET | ORAL | Status: AC
  Administered 2011-05-03: 324 mg via ORAL
  Filled 2011-05-03: qty 4

## 2011-05-03 MED ORDER — SODIUM CHLORIDE 0.9 % IV SOLN
250.0000 mL | INTRAVENOUS | Status: DC | PRN
Start: 2011-05-03 — End: 2011-05-03

## 2011-05-03 MED ORDER — LIDOCAINE HCL (PF) 1 % IJ SOLN
INTRAMUSCULAR | Status: AC
  Filled 2011-05-03: qty 30

## 2011-05-03 MED ORDER — SODIUM CHLORIDE 0.9 % IJ SOLN: 3.0000 mL | Freq: Two times a day (BID) | INTRAMUSCULAR | Status: DC

## 2011-05-03 MED ORDER — HEPARIN (PORCINE) IN NACL 2-0.9 UNIT/ML-% IJ SOLN
INTRAMUSCULAR | Status: AC
  Filled 2011-05-03: qty 2000

## 2011-05-03 MED ORDER — METOPROLOL TARTRATE 1 MG/ML IV SOLN
INTRAVENOUS | Status: AC
  Filled 2011-05-03: qty 5

## 2011-05-03 MED ORDER — ASPIRIN 81 MG PO CHEW
324.0000 mg | CHEWABLE_TABLET | ORAL | Status: AC
  Administered 2011-05-03: 324 mg via ORAL

## 2011-05-03 MED ORDER — SODIUM CHLORIDE 0.9 % IJ SOLN: 3.0000 mL | INTRAMUSCULAR | Status: DC | PRN

## 2011-05-03 NOTE — H&P (View-Only) (Signed)
[address] [place], [place]  [address] Phone: [phone] Fax:  [phone]  Date:  04/26/2011   Name:  [name]       DOB:  04/08/1940 MRN:  [id number]  PCP:  Dr. Shelton Primary Cardiologist:  Dr.  Thomas Stuckey  Primary Electrophysiologist:  None    History of Present Illness: [name] is a 71 y.o. female who presents for post hospital follow up.  She has a history of CAD, hypertension, hyperlipidemia and glucose intolerance.  LHC 02/2005: EF 70%, circumflex marginal 50%, ostial LAD 60%, 30% proximal LAD.  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  She was admitted 3/953/11 with atrial fibrillation with rapid ventricular rate.  She was mildly volume overloaded in the setting of this.  She was placed on IV diltiazem and converted to sinus rhythm.  She was placed on Pradaxa due to her thromboembolic risk factor profile.  Labs: Hemoglobin 13.8, potassium 4.1, creatinine 0.9, ALT 9, crit markers negative x3.  Echocardiogram 04/14/11: Moderate LVH, EF 60-65%, mild LAE.  She is stable.  She was supposed to stop Lotrel and remain on diltiazem bid.  She does note chest pain that continues.  She presented to the hospital with chest pain in the setting of AFib.  She is not that active. She only notes chest pain with ADLs such as dressing.  No radiation.  She does notes assoc dyspnea and diaph.  No nausea.  No syncope.  No orthopnea, PND.  LE edema stable.    Past Medical History  Diagnosis Date  . Coronary artery disease   . Hypertension   . Hypercholesteremia   . Obesity   . Impaired glucose tolerance   . Anxiety disorder   . Gastritis   . Osteoarthritis   . Heart murmur     from age 14 associated with some hemoptysis  .  H/O: hysterectomy   . Atrial fibrillation     a. new 04/2011  . Diastolic CHF     a. NL EF by echo 04/2011  . CVA (cerebral infarction)     Current Outpatient Prescriptions  Medication Sig Dispense Refill  . amLODipine-benazepril (LOTREL) 5-20 MG per capsule Take 1 capsule by mouth daily.       . aspirin 81 MG tablet Take 81 mg by mouth daily.      . atorvastatin (LIPITOR) 20 MG tablet Take 20 mg by mouth every morning.      . citalopram (CELEXA) 10 MG tablet Take 10 mg by mouth daily.       . dabigatran (PRADAXA) 150 MG CAPS Take 1 capsule (150 mg total) by mouth every 12 (twelve) hours.  60 capsule  6  . DETROL LA 4 MG 24 hr capsule Take 4 mg by mouth daily.       . diltiazem (CARDIZEM SR) 120 MG 12 hr capsule Take 1 capsule (120 mg total) by mouth every 12 (twelve) hours.  30 capsule  6  . metoprolol (LOPRESSOR) 50 MG tablet Take 25 mg by mouth 2 (two) times daily.      . Tetrahydrozoline HCl (EYE DROPS OP) Apply 1 drop to eye at bedtime.        Allergies: Allergies  Allergen Reactions  . Sulfonamide Derivatives       Unknown.    History  Substance Use Topics  . Smoking status: Never Smoker   . Smokeless tobacco: Not on file  . Alcohol Use: No     ROS:  Please see the history of present illness.   All other systems reviewed and negative.   PHYSICAL EXAM: VS:  BP 134/68  Pulse 69  Wt 174 lb (78.926 kg) Well nourished, well developed, in no acute distress HEENT: normal Neck: no JVD at 90 degrees Cardiac:  normal S1, S2; RRR; no murmur Lungs:  clear to auscultation bilaterally, no wheezing, rhonchi or rales Abd: soft, nontender, no hepatomegaly Ext: trace bilat edema Skin: warm and dry Neuro:  CNs 2-12 intact, no focal abnormalities noted  EKG:  Sinus rhythm, heart rate 69, normal axis, no acute changes  ASSESSMENT AND PLAN:  1. Chest pain  Symptoms concerning for angina.  D/w Dr.  Thomas Stuckey.  Will arrange cardiac cath.  Risks and benefits of cardiac  catheterization have been discussed with the patient.  These include bleeding, infection, kidney damage, stroke, heart attack, death.  The patient understands these risks and is willing to proceed.  Hold Pradaxa 3 days prior.  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  Follow up after cath.   2. Atrial fibrillation  Maintaining NSR.  Continue Pradaxa.  She is unsteady at times.  We had a long discussion about using caution.  We will need to monitor her safety for Pradaxa over time.   3. CAD  Arrange cath as noted.  Continue ASA.   4. Diastolic CHF  No clear volume overload today.  Will check bmet and bnp.  Add lasix if needed.   5. HYPERTENSION, UNSPECIFIED  She was supposed to stop Lotrel.  Will d/c and continue Benazepril 20 mg QD.     Signed, Ida Milbrath, PA-C  10:58 AM 04/26/2011    

## 2011-05-03 NOTE — Progress Notes (Signed)
Stable post cath.  Results reviewed with patient and her children.  No current pain. Groin care reviewed:  No tub bath for one week, keep angioseal clean.  She says she showers anyway. Plan followup in office.  Not to restart Pradaxa for one week.  Reviewed with son her does her meds.    Taequan Stockhausen 1:10 PM 05/03/2011

## 2011-05-03 NOTE — CV Procedure (Signed)
   Cardiac Catheterization Procedure Note  Name: [name] MRN: [id number] DOB: 11-Mar-1940  Procedure: Left Heart Cath, Selective Coronary Angiography, LV angiography, Angioseal closure  Indication: [name] is well known to me.  She presented with atrial fib with rapid ventricular response. This was associated with chest pain.  See dc summary.  She has continued to have some discomfort, and with known prior disease, cardiac cath was felt to be indicated.  Pradaxa has been held for three days.  This was confirmed by her family who provides her medications for her.  Risks were reviewed and she was amenable to proceeding.  We know from prior cath that her entire coronary system arises from the right coronary cusp, and this was slightly difficult to engage previously.  As such we elected to attempt 47f catheters from the groin.      Procedural details: The right groin was prepped, draped, and anesthetized with 1% lidocaine. Using front wall entry after confirmation of landmarks, a 4 French sheath was introduced into the right femoral artery and two views of the entry site confirmed for later angioseal use.  A single 3DRC catheter was used for coronary angiography.  The pigtail was used for  left ventriculography. Catheter exchanges were performed over a guidewire. There were no immediate procedural complications.  The groin was reprepped, and gloves exchanged.  A 5/6 Angioseal was used for closure, and good hemostasis achieved.    The patient was transferred to the post catheterization recovery area for further monitoring.  Procedural Findings: Hemodynamics:  AO 145/77 (105) LV 155/7/17 Approximate 5mm gradient on pullback.   Coronary angiography: Coronary dominance: right  Left mainstem: There is a left main that arises from the RCC. It provides an LAD system and a posterior groove branch.  The LAD has a mild are of narrowing at its takeoff at a bend site, but it appears less than 50%.   There is mild irregularity throughout the LAD and it wraps the apex.  The apical portion, small, has about 50-57 diffuse plaque, but is small in caliber.  There is an PDA, PLA that comes from this vessel as well, and it has a  50% mid narrowing, and a long area of plaque leading into the proximal mid PDA territory retrograde that is probably about 70% and diffuse.  This would be difficult to navigate  (see films)  Left circumflex (LCx): arises from a common ostium with the RCA.  Importantly, it is large.  There is a smaller superior branch with mild irregularity, but the vessel contains no critical stenosis.    Right coronary artery (RCA):  Non dominant and comes off with two RV branches with some mild irregularity.  The inferior wall is supplied mostly from the retrograde branch from the mainstem LMCA  (which arises from the RCA cusp as noted)  Left ventriculography: Left ventricular systolic function is normal, LVEF is estimated at 55-65%, there is no significant mitral regurgitation.  Final Conclusions:   1.  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  Medical management would seem most appropriate at this time.  [name] 05/03/2011, 9:01 AM

## 2011-05-03 NOTE — Interval H&P Note (Signed)
History and Physical Interval Note:  05/03/2011 8:00 AM  [name]  has presented today for surgery, with the diagnosis of chest pain  The various methods of treatment have been discussed with the patient and family. After consideration of risks, benefits and other options for treatment, the patient has consented to  Procedure(s) (LRB): LEFT HEART CATHETERIZATION WITH CORONARY ANGIOGRAM (N/A) as a surgical intervention .  The patients' history has been reviewed, patient examined, no change in status, stable for surgery.  I have reviewed the patients' chart and labs.  Questions were answered to the patient's satisfaction.    I brought her son in and confirmed that she had not had Pradaxa since first dose on Tuesday.  Both he and her daughter confirmed this.  They understand dynamics of pradaxa.  Her Cr Cl is normal.  I had considered radial catheterization, but she has an anamolous LCA that was difficult to cannulate previously.  Will use 8f catheters from the RFA approach.  They are aware of the risks of bleeding.    [name]

## 2011-05-03 NOTE — Discharge Instructions (Signed)
Groin Site Care Refer to this sheet in the next few weeks. These instructions provide you with information on caring for yourself after your procedure. Your caregiver may also give you more specific instructions. Your treatment has been planned according to current medical practices, but problems sometimes occur. Call your caregiver if you have any problems or questions after your procedure. HOME CARE INSTRUCTIONS  You may shower 24 hours after the procedure. Remove the bandage (dressing) and gently wash the site with plain soap and water. Gently pat the site dry.   Do not apply powder or lotion to the site.   Do not sit in a bathtub, swimming pool, or whirlpool for 5 to 7 days.   No bending, squatting, or lifting anything over 10 pounds (4.5 kg) as directed by your caregiver.   Inspect the site at least twice daily.   Do not drive home if you are discharged the same day of the procedure. Have someone else drive you.   You may drive 24 hours after the procedure unless otherwise instructed by your caregiver.  What to expect:  Any bruising will usually fade within 1 to 2 weeks.   Blood that collects in the tissue (hematoma) may be painful to the touch. It should usually decrease in size and tenderness within 1 to 2 weeks.  SEEK IMMEDIATE MEDICAL CARE IF:  You have unusual pain at the groin site or down the affected leg.   You have redness, warmth, swelling, or pain at the groin site.   You have drainage (other than a small amount of blood on the dressing).   You have chills.   You have a fever or persistent symptoms for more than 72 hours.   You have a fever and your symptoms suddenly get worse.   Your leg becomes pale, cool, tingly, or numb.   You have heavy bleeding from the site. Hold pressure on the site.  Document Released: 02/23/2010 Document Revised: 01/10/2011 Document Reviewed: 02/23/2010 [facility] Patient Information 2012 [place], [place].

## 2011-05-15 NOTE — H&P (Signed)
The patient was evaluated, and I concur with the plans.  I have discussed the case with the patient and her family, and they agree to catheterization.  Risks and benefits, and approach reviewed.    [name] .

## 2011-05-22 ENCOUNTER — Ambulatory Visit (INDEPENDENT_AMBULATORY_CARE_PROVIDER_SITE_OTHER): Payer: Medicare Other | Admitting: Cardiology

## 2011-05-22 ENCOUNTER — Encounter: Payer: Self-pay | Admitting: Cardiology

## 2011-05-22 VITALS — BP 118/68 | HR 64 | Ht 65.0 in | Wt 173.0 lb

## 2011-05-22 DIAGNOSIS — I2510 Atherosclerotic heart disease of native coronary artery without angina pectoris: Secondary | ICD-10-CM

## 2011-05-22 DIAGNOSIS — I10 Essential (primary) hypertension: Secondary | ICD-10-CM

## 2011-05-22 DIAGNOSIS — I4891 Unspecified atrial fibrillation: Secondary | ICD-10-CM

## 2011-05-22 DIAGNOSIS — E7800 Pure hypercholesterolemia, unspecified: Secondary | ICD-10-CM

## 2011-05-22 NOTE — Assessment & Plan Note (Signed)
On atrovastatin  (also on dilt), but lower doses.  Will watch closely.

## 2011-05-22 NOTE — Patient Instructions (Signed)
Your physician recommends that you schedule a follow-up appointment in: 6 WEEKS  Your physician recommends that you continue on your current medications as directed. Please refer to the Current Medication list given to you today.  Your physician recommends that you have lab work today: BMP and CBC   

## 2011-05-22 NOTE — Assessment & Plan Note (Signed)
Reg rhythm today. Continue medical therapy.

## 2011-05-22 NOTE — Assessment & Plan Note (Signed)
Improved at present. 

## 2011-05-22 NOTE — Progress Notes (Signed)
HPI:  She is doing better.  Has not had fast rates or further issues.  Feels good.  Tolerating meds.  Went back on Pradaxa after her procedure about three days.  No current chest pain.    Current Outpatient Prescriptions  Medication Sig Dispense Refill  . amLODipine-benazepril (LOTREL) 5-20 MG per capsule Take 1 capsule by mouth daily.      Marland Kitchen aspirin 81 MG tablet Take 81 mg by mouth daily.      Marland Kitchen atorvastatin (LIPITOR) 20 MG tablet Take 20 mg by mouth every morning.      . benazepril (LOTENSIN) 20 MG tablet Take 1 tablet (20 mg total) by mouth daily.  30 tablet  11  . carbidopa-levodopa (SINEMET IR) 25-100 MG per tablet Take 2 tablets by mouth 3 (three) times daily.      . dabigatran (PRADAXA) 150 MG CAPS Take 1 capsule (150 mg total) by mouth every 12 (twelve) hours.  60 capsule  6  . diltiazem (CARDIZEM SR) 120 MG 12 hr capsule Take 1 capsule (120 mg total) by mouth every 12 (twelve) hours.  30 capsule  6  . hydrochlorothiazide (MICROZIDE) 12.5 MG capsule Take 12.5 mg by mouth daily.      . metoprolol (LOPRESSOR) 50 MG tablet Take 25 mg by mouth 2 (two) times daily.        Allergies  Allergen Reactions  . Sulfonamide Derivatives     Unknown.    Past Medical History  Diagnosis Date  . Coronary artery disease   . Hypertension   . Hypercholesteremia   . Obesity   . Impaired glucose tolerance   . Anxiety disorder   . Gastritis   . Osteoarthritis   . Heart murmur     from age 27 associated with some hemoptysis  . H/O: hysterectomy   . Atrial fibrillation     a. new 04/2011  . Diastolic CHF     a. NL EF by echo 04/2011  . CVA (cerebral infarction)     Past Surgical History  Procedure Date  . Carpal tunnel release     right  . Tonsillectomy   . Eye surgery     left  . Replacement total knee     right    Family History  Problem Relation Age of Onset  . Cancer Mother 45    died  . Heart attack Father 72    died    History   Social History  . Marital Status:  Widowed    Spouse Name: N/A    Number of Children: 2  . Years of Education: N/A   Occupational History  . retired    Social History Main Topics  . Smoking status: Never Smoker   . Smokeless tobacco: Not on file  . Alcohol Use: No  . Drug Use: No  . Sexually Active: Not on file   Other Topics Concern  . Not on file   Social History Narrative  . No narrative on file    ROS: Please see the HPI.  All other systems reviewed and negative.  PHYSICAL EXAM:  BP 118/68  Pulse 64  Ht 5\' 5"  (1.651 m)  Wt 173 lb (78.472 kg)  BMI 28.79 kg/m2  General: Well developed, well nourished, in no acute distress.  Sitting in wheel chair.   Head:  Normocephalic and atraumatic. Neck: no JVD Lungs: Clear to auscultation and percussion. Heart: Normal S1 and S2.  Minimal SEM Pulses: Pulses normal in all  4 extremities. Extremities: No clubbing or cyanosis. No edema. Neurologic: Alert and oriented x 3.  EKG:  Not done today.  CATH DATA    AO 145/77 (105)  LV 155/7/17  Approximate 5mm gradient on pullback.  Coronary angiography:  Coronary dominance: right  Left mainstem: There is a left main that arises from the RCC. It provides an LAD system and a posterior groove branch. The LAD has a mild are of narrowing at its takeoff at a bend site, but it appears less than 50%. There is mild irregularity throughout the LAD and it wraps the apex. The apical portion, small, has about 50-57 diffuse plaque, but is small in caliber. There is an PDA, PLA that comes from this vessel as well, and it has a 50% mid narrowing, and a long area of plaque leading into the proximal mid PDA territory retrograde that is probably about 70% and diffuse. This would be difficult to navigate (see films)  Left circumflex (LCx): arises from a common ostium with the RCA. Importantly, it is large. There is a smaller superior branch with mild irregularity, but the vessel contains no critical stenosis.  Right coronary artery (RCA):  Non dominant and comes off with two RV branches with some mild irregularity. The inferior wall is supplied mostly from the retrograde branch from the mainstem LMCA (which arises from the RCA cusp as noted)  Left ventriculography: Left ventricular systolic function is normal, LVEF is estimated at 55-65%, there is no significant mitral regurgitation.  Final Conclusions:  1. 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 Medical management would seem most appropriate at this time.  [name]  05/03/2011, 9:01 AM   ASSESSMENT AND PLAN:

## 2011-05-23 LAB — CBC WITH DIFFERENTIAL/PLATELET
Basophils Absolute: 0 10*3/uL (ref 0.0–0.1)
Basophils Relative: 0.3 % (ref 0.0–3.0)
Eosinophils Absolute: 0.4 10*3/uL (ref 0.0–0.7)
Eosinophils Relative: 4 % (ref 0.0–5.0)
HCT: 42.9 % (ref 36.0–46.0)
Hemoglobin: 13.8 g/dL (ref 12.0–15.0)
Lymphocytes Relative: 24.3 % (ref 12.0–46.0)
Lymphs Abs: 2.7 10*3/uL (ref 0.7–4.0)
MCHC: 32.2 g/dL (ref 30.0–36.0)
MCV: 82.6 fl (ref 78.0–100.0)
Monocytes Absolute: 0.6 10*3/uL (ref 0.1–1.0)
Monocytes Relative: 5.5 % (ref 3.0–12.0)
Neutro Abs: 7.3 10*3/uL (ref 1.4–7.7)
Neutrophils Relative %: 65.9 % (ref 43.0–77.0)
Platelets: 298 10*3/uL (ref 150.0–400.0)
RBC: 5.2 Mil/uL — ABNORMAL HIGH (ref 3.87–5.11)
RDW: 14.7 % — ABNORMAL HIGH (ref 11.5–14.6)
WBC: 11.1 10*3/uL — ABNORMAL HIGH (ref 4.5–10.5)

## 2011-05-23 LAB — BASIC METABOLIC PANEL
BUN: 16 mg/dL (ref 6–23)
CO2: 27 mEq/L (ref 19–32)
Calcium: 10 mg/dL (ref 8.4–10.5)
Chloride: 107 mEq/L (ref 96–112)
Creatinine, Ser: 1 mg/dL (ref 0.4–1.2)
GFR: 69.84 mL/min (ref >60.00)
Glucose, Bld: 99 mg/dL (ref 70–99)
Potassium: 4.2 mEq/L (ref 3.5–5.1)
Sodium: 142 mEq/L (ref 135–145)

## 2011-05-27 NOTE — Assessment & Plan Note (Signed)
She underwent cath demonstrating no critical obstruction.  She is on medical treatment.  I woul dnot make major changes in her medical regimen at this point in time.  She is agreeable.

## 2011-06-04 ENCOUNTER — Other Ambulatory Visit: Payer: Self-pay

## 2011-06-04 DIAGNOSIS — D72829 Elevated white blood cell count, unspecified: Secondary | ICD-10-CM

## 2011-06-05 ENCOUNTER — Other Ambulatory Visit (INDEPENDENT_AMBULATORY_CARE_PROVIDER_SITE_OTHER): Payer: Medicare Other

## 2011-06-05 DIAGNOSIS — D72829 Elevated white blood cell count, unspecified: Secondary | ICD-10-CM

## 2011-06-05 LAB — CBC WITH DIFFERENTIAL/PLATELET
Basophils Absolute: 0 10*3/uL (ref 0.0–0.1)
Basophils Relative: 0.4 % (ref 0.0–3.0)
Eosinophils Absolute: 0.4 10*3/uL (ref 0.0–0.7)
Eosinophils Relative: 3.7 % (ref 0.0–5.0)
HCT: 42 % (ref 36.0–46.0)
Hemoglobin: 13.6 g/dL (ref 12.0–15.0)
Lymphocytes Relative: 22.6 % (ref 12.0–46.0)
Lymphs Abs: 2.2 10*3/uL (ref 0.7–4.0)
MCHC: 32.5 g/dL (ref 30.0–36.0)
MCV: 82.7 fl (ref 78.0–100.0)
Monocytes Absolute: 0.6 10*3/uL (ref 0.1–1.0)
Monocytes Relative: 6.3 % (ref 3.0–12.0)
Neutro Abs: 6.5 10*3/uL (ref 1.4–7.7)
Neutrophils Relative %: 67 % (ref 43.0–77.0)
Platelets: 266 10*3/uL (ref 150.0–400.0)
RBC: 5.08 Mil/uL (ref 3.87–5.11)
RDW: 15.7 % — ABNORMAL HIGH (ref 11.5–14.6)
WBC: 9.7 10*3/uL (ref 4.5–10.5)

## 2011-06-06 ENCOUNTER — Other Ambulatory Visit: Payer: Medicare Other

## 2011-07-02 ENCOUNTER — Encounter: Payer: Self-pay | Admitting: Cardiology

## 2011-07-02 ENCOUNTER — Ambulatory Visit (INDEPENDENT_AMBULATORY_CARE_PROVIDER_SITE_OTHER): Payer: Medicare Other | Admitting: Cardiology

## 2011-07-02 VITALS — BP 120/84 | HR 104 | Ht 62.0 in | Wt 174.0 lb

## 2011-07-02 DIAGNOSIS — I2510 Atherosclerotic heart disease of native coronary artery without angina pectoris: Secondary | ICD-10-CM

## 2011-07-02 DIAGNOSIS — I4891 Unspecified atrial fibrillation: Secondary | ICD-10-CM

## 2011-07-02 NOTE — Assessment & Plan Note (Signed)
See recent cath procedure.

## 2011-07-02 NOTE — Assessment & Plan Note (Signed)
Back in rapid atrial fib.  She is on rate control meds, and also on pradaxa.  She converted spontaneously before.  Will give her extra metoprolol today and have her return in the am.  If not in rhythm, make elect to set her up for cardioversion.  Will see her early tomorrow.  If she has a change in her breathing, her son is to take her to the ER earlier.  Hopefully this will settle down on its own.  She may now need AA treatment.

## 2011-07-02 NOTE — Progress Notes (Signed)
HPI:  Patient comes in today for follow up.  Was doing well, but then this am noted her heart rate got fast.  After that, she noted a little shortness of breath. She arrives today and is noted to be back in atrial fibrillation similar to what she had before.  She converted spontaneously last time.  Is on chronic Pradaxa.   Current Outpatient Prescriptions  Medication Sig Dispense Refill  . amLODipine-benazepril (LOTREL) 5-20 MG per capsule Take 1 capsule by mouth daily.      Marland Kitchen aspirin 81 MG tablet Take 81 mg by mouth daily.      Marland Kitchen atorvastatin (LIPITOR) 20 MG tablet Take 20 mg by mouth every morning.      . benazepril (LOTENSIN) 20 MG tablet Take 1 tablet (20 mg total) by mouth daily.  30 tablet  11  . carbidopa-levodopa (SINEMET IR) 25-100 MG per tablet Take 2 tablets by mouth 3 (three) times daily.      . dabigatran (PRADAXA) 150 MG CAPS Take 1 capsule (150 mg total) by mouth every 12 (twelve) hours.  60 capsule  6  . diltiazem (CARDIZEM SR) 120 MG 12 hr capsule Take 1 capsule (120 mg total) by mouth every 12 (twelve) hours.  30 capsule  6  . metoprolol (LOPRESSOR) 50 MG tablet Take 25 mg by mouth 2 (two) times daily.        Allergies  Allergen Reactions  . Sulfonamide Derivatives     Unknown.    Past Medical History  Diagnosis Date  . Coronary artery disease   . Hypertension   . Hypercholesteremia   . Obesity   . Impaired glucose tolerance   . Anxiety disorder   . Gastritis   . Osteoarthritis   . Heart murmur     from age 50 associated with some hemoptysis  . H/O: hysterectomy   . Atrial fibrillation     a. new 04/2011  . Diastolic CHF     a. NL EF by echo 04/2011  . CVA (cerebral infarction)     Past Surgical History  Procedure Date  . Carpal tunnel release     right  . Tonsillectomy   . Eye surgery     left  . Replacement total knee     right    Family History  Problem Relation Age of Onset  . Cancer Mother 63    died  . Heart attack Father 35    died     History   Social History  . Marital Status: Widowed    Spouse Name: N/A    Number of Children: 2  . Years of Education: N/A   Occupational History  . retired    Social History Main Topics  . Smoking status: Never Smoker   . Smokeless tobacco: Not on file  . Alcohol Use: No  . Drug Use: No  . Sexually Active: Not on file   Other Topics Concern  . Not on file   Social History Narrative  . No narrative on file    ROS: Please see the HPI.  All other systems reviewed and negative.  PHYSICAL EXAM:  BP 120/84  Pulse 104  Ht 5\' 2"  (1.575 m)  Wt 174 lb (78.926 kg)  BMI 31.83 kg/m2  General: Well developed, well nourished, in no acute distress. Head:  Normocephalic and atraumatic. Neck: no JVD Lungs: Clear to auscultation and percussion. Heart: irregularly and irregular and rapid.   Abdomen:  Normal bowel sounds;  soft; non tender; no organomegaly Pulses: Pulses normal in all 4 extremities. Extremities: No clubbing or cyanosis. No edema. Neurologic: Alert and oriented x 3.  EKG:  Atrial fib with rapid response  ASSESSMENT AND PLAN:

## 2011-07-02 NOTE — Patient Instructions (Signed)
Your physician recommends that you schedule a follow-up appointment on 07/03/11 at 8:45.

## 2011-07-03 ENCOUNTER — Encounter: Payer: Self-pay | Admitting: Cardiology

## 2011-07-03 ENCOUNTER — Ambulatory Visit (INDEPENDENT_AMBULATORY_CARE_PROVIDER_SITE_OTHER): Payer: Medicare Other | Admitting: Cardiology

## 2011-07-03 VITALS — BP 117/78 | HR 80 | Ht 62.0 in | Wt 170.0 lb

## 2011-07-03 DIAGNOSIS — I10 Essential (primary) hypertension: Secondary | ICD-10-CM

## 2011-07-03 DIAGNOSIS — I4891 Unspecified atrial fibrillation: Secondary | ICD-10-CM

## 2011-07-03 DIAGNOSIS — I2510 Atherosclerotic heart disease of native coronary artery without angina pectoris: Secondary | ICD-10-CM

## 2011-07-03 DIAGNOSIS — Z01810 Encounter for preprocedural cardiovascular examination: Secondary | ICD-10-CM

## 2011-07-03 MED ORDER — METOPROLOL TARTRATE 50 MG PO TABS: 50.0000 mg | ORAL_TABLET | Freq: Two times a day (BID) | ORAL | Status: DC

## 2011-07-03 NOTE — Assessment & Plan Note (Signed)
I think the patient should be stable for a cataract operation without further cardiac workup.

## 2011-07-03 NOTE — Assessment & Plan Note (Signed)
Back in NSR this am.  HR is 80.  So will increase metop to 50BID and hold Lotensin for now so BP not too low.  FU in one week and three.

## 2011-07-03 NOTE — Assessment & Plan Note (Signed)
Will increase metoprolol to 50mg  BID and stop Lotensin.  FU one week with PA.  Will see again in three weeks.

## 2011-07-03 NOTE — Patient Instructions (Addendum)
Your physician has recommended you make the following change in your medication: INCREASE Metoprolol Tartrate to 50mg  take one by mouth twice a day, STOP Lotensin (Benazepril)  Your physician recommends that you schedule a follow-up appointment in: 1 WEEK with Physician Assistant, 3 WEEKS with Dr Riley Kill

## 2011-07-03 NOTE — Assessment & Plan Note (Signed)
See recent cath study.

## 2011-07-03 NOTE — Progress Notes (Signed)
   HPI:  She thinks she may have converted during the night.  Doing much better at this point.  No chest pain.    Current Outpatient Prescriptions  Medication Sig Dispense Refill  . amLODipine-benazepril (LOTREL) 5-20 MG per capsule Take 1 capsule by mouth daily.      Marland Kitchen aspirin 81 MG tablet Take 81 mg by mouth daily.      Marland Kitchen atorvastatin (LIPITOR) 20 MG tablet Take 20 mg by mouth every morning.      . benazepril (LOTENSIN) 20 MG tablet Take 1 tablet (20 mg total) by mouth daily.  30 tablet  11  . carbidopa-levodopa (SINEMET IR) 25-100 MG per tablet Take 2 tablets by mouth 3 (three) times daily.      . dabigatran (PRADAXA) 150 MG CAPS Take 1 capsule (150 mg total) by mouth every 12 (twelve) hours.  60 capsule  6  . diltiazem (CARDIZEM SR) 120 MG 12 hr capsule Take 1 capsule (120 mg total) by mouth every 12 (twelve) hours.  30 capsule  6  . metoprolol (LOPRESSOR) 50 MG tablet Take 25 mg by mouth 2 (two) times daily.        Allergies  Allergen Reactions  . Sulfonamide Derivatives     Unknown.    Past Medical History  Diagnosis Date  . Coronary artery disease   . Hypertension   . Hypercholesteremia   . Obesity   . Impaired glucose tolerance   . Anxiety disorder   . Gastritis   . Osteoarthritis   . Heart murmur     from age 62 associated with some hemoptysis  . H/O: hysterectomy   . Atrial fibrillation     a. new 04/2011  . Diastolic CHF     a. NL EF by echo 04/2011  . CVA (cerebral infarction)     Past Surgical History  Procedure Date  . Carpal tunnel release     right  . Tonsillectomy   . Eye surgery     left  . Replacement total knee     right    Family History  Problem Relation Age of Onset  . Cancer Mother 61    died  . Heart attack Father 71    died    History   Social History  . Marital Status: Widowed    Spouse Name: N/A    Number of Children: 2  . Years of Education: N/A   Occupational History  . retired    Social History Main Topics  . Smoking  status: Never Smoker   . Smokeless tobacco: Not on file  . Alcohol Use: No  . Drug Use: No  . Sexually Active: Not on file   Other Topics Concern  . Not on file   Social History Narrative  . No narrative on file    ROS: Please see the HPI.  All other systems reviewed and negative.  PHYSICAL EXAM:  BP 117/78  Pulse 80  Ht 5\' 2"  (1.575 m)  Wt 170 lb (77.111 kg)  BMI 31.09 kg/m2  General: Well developed, well nourished, in no acute distress. Head:  Normocephalic and atraumatic. Neck: no JVD Lungs: Clear to auscultation and percussion. Heart: Normal S1 and S2.  No murmur, rubs or gallops.  Pulses: Pulses normal in all 4 extremities. Extremities: No clubbing or cyanosis. No edema. Neurologic: Alert and oriented x 3.  EKG:  NSR.   Normal PR.  WNL.    ASSESSMENT AND PLAN:

## 2011-07-10 ENCOUNTER — Ambulatory Visit (INDEPENDENT_AMBULATORY_CARE_PROVIDER_SITE_OTHER): Payer: Medicare Other | Admitting: Physician Assistant

## 2011-07-10 ENCOUNTER — Encounter: Payer: Self-pay | Admitting: Physician Assistant

## 2011-07-10 VITALS — BP 110/68 | HR 65 | Ht 62.0 in | Wt 172.0 lb

## 2011-07-10 DIAGNOSIS — I10 Essential (primary) hypertension: Secondary | ICD-10-CM

## 2011-07-10 DIAGNOSIS — I5032 Chronic diastolic (congestive) heart failure: Secondary | ICD-10-CM

## 2011-07-10 DIAGNOSIS — I4891 Unspecified atrial fibrillation: Secondary | ICD-10-CM

## 2011-07-10 DIAGNOSIS — I2510 Atherosclerotic heart disease of native coronary artery without angina pectoris: Secondary | ICD-10-CM

## 2011-07-10 NOTE — Patient Instructions (Signed)
Your physician recommends that you continue on your current medications as directed. Please refer to the Current Medication list given to you today.  Keep your follow up with Dr Riley Kill on 08/01/11, call if you need anything before then

## 2011-07-10 NOTE — Progress Notes (Addendum)
[address] [place], [place]  [address] Phone: [phone] Fax:  [phone]  Date:  07/10/2011   Name:  [name]   DOB:  08-06-1940   MRN:  [id number]  PCP:  [name] Garnet., MD, MD  Primary Cardiologist:  Dr.  Shawnie [name]  Primary Electrophysiologist:  None    History of Present Illness: [name] is a 71 y.o. female who returns for follow up.  She has a history of CAD, hypertension, hyperlipidemia and glucose intolerance. LHC 02/2005: EF 70%, circumflex marginal 50%, ostial LAD 60%, 30% proximal LAD. 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 She was admitted 04/2011 with atrial fibrillation with rapid ventricular rate. She was mildly volume overloaded in the setting of this. She was placed on IV diltiazem and converted to sinus rhythm. She was placed on Pradaxa due to her thromboembolic risk factor profile.  Echocardiogram 04/14/11: Moderate LVH, EF 60-65%, mild LAE.  Seen in follow up by me 04/26/11 with complaints of chest pain.  Cardiac catheterization was arranged as noted below.  She had no significant obstructive disease and medical therapy was continued.  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 It provides an LAD system and a posterior groove branch. The LAD has a mild area of narrowing at its takeoff at a bend site, but it appears less than 50%. Apical portion, small, has about 50-57 diffuse plaque; mPDA 50%; proximal-mid PDA 70% and diffuse. This would be difficult to navigate (see films)  Left circumflex (LCx): arises from a common ostium with the RCA. Importantly, it is large. There is a smaller superior branch with mild irregularity, but the vessel contains no critical stenosis.  Right coronary artery (RCA): Non  dominant and comes off with two RV branches with some mild irregularity. The inferior wall is supplied mostly from the retrograde branch from the mainstem LMCA (which arises from the RCA cusp as noted)  Left ventriculography:  LVEF is estimated at 55-65%   Final Conclusions:  1. 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 Medical management would seem most appropriate at this time.  She has recently been followed by Dr. Riley [name] and noted to be back in atrial fibrillation.  He saw her on 5/29.  Her rhythm was back to sinus.  He adjusted her beta blocker and stopped her Lotensin and asked that she followup today.  Doing well.  No palpitations.  No chest pain.  She has chronic DOE without change.  No orthopnea, PND.  No syncope.    Wt Readings from Last 3 Encounters:  07/10/11 172 lb (78.019 kg)  07/03/11 170 lb (77.111 kg)  07/02/11 174 lb (78.926 kg)     Potassium  Date/Time Value Range Status  05/22/2011  3:22 PM 4.2  3.5-5.1 (mEq/L) Final     Creatinine, Ser  Date/Time Value Range Status  05/22/2011  3:22 PM 1.0  0.4-1.2 (mg/dL) Final     ALT  Date/Time Value Range Status  04/13/2011  9:52 AM 9  0-35 (U/L) Final     Hemoglobin  Date/Time Value Range Status  06/05/2011 10:40 AM 13.6  12.0-15.0 (g/dL) Final    Past Medical History  Diagnosis Date  . 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 Hypertension   . Hypercholesteremia   . Obesity   . Impaired glucose tolerance   . Anxiety disorder   . Gastritis   . Osteoarthritis   . Heart murmur     from age 28 associated with some hemoptysis  . H/O: hysterectomy   . Atrial fibrillation     a. new 04/2011;  b. Pradaxa  . Chronic diastolic heart failure     Echocardiogram 04/14/11: Moderate LVH, EF 60-65%, mild LAE.  Marland Kitchen CVA (cerebral infarction)     Current Outpatient  Prescriptions  Medication Sig Dispense Refill  . amLODipine-benazepril (LOTREL) 5-20 MG per capsule Take 1 capsule by mouth daily.      Marland Kitchen aspirin 81 MG tablet Take 81 mg by mouth daily.      Marland Kitchen atorvastatin (LIPITOR) 20 MG tablet Take 20 mg by mouth every morning.      . carbidopa-levodopa (SINEMET IR) 25-100 MG per tablet Take 2 tablets by mouth 3 (three) times daily.      . dabigatran (PRADAXA) 150 MG CAPS Take 1 capsule (150 mg total) by mouth every 12 (twelve) hours.  60 capsule  6  . diltiazem (CARDIZEM SR) 120 MG 12 hr capsule Take 1 capsule (120 mg total) by mouth every 12 (twelve) hours.  30 capsule  6  . metoprolol (LOPRESSOR) 50 MG tablet Take 1 tablet (50 mg total) by mouth 2 (two) times daily.  60 tablet  6    Allergies: Allergies  Allergen Reactions  . Sulfonamide Derivatives     Unknown.    History  Substance Use Topics  . Smoking status: Never Smoker   . Smokeless tobacco: Not on file  . Alcohol Use: No     PHYSICAL EXAM: VS:  BP 110/68  Pulse 65  Ht 5\' 2"  (1.575 m)  Wt 172 lb (78.019 kg)  BMI 31.46 kg/m2 Well nourished, well developed, in no acute distress HEENT: normal Neck: no JVD at 90 degrees  Cardiac:  normal S1, S2; RRR; no murmur Lungs:  clear to auscultation bilaterally, no wheezing, rhonchi or rales Abd: soft, nontender, no hepatomegaly Ext: trace pitting ankle edema Skin: warm and dry Neuro:  CNs 2-12 intact, no focal abnormalities noted  EKG:  NSR, HR 65, normal axis, no ischemic changes.   ASSESSMENT AND PLAN:  1.  Atrial Fibrillation Maintaining sinus rhythm. She remains on Pradaxa. Followup with Dr. Riley [name] as planned.  2.  Hypertension Controlled.  Continue current therapy.   3.  Coronary Artery Disease Nonobstructive disease by cardiac catheterization in 04/2011. Continue aspirin and statin.  4.  Chronic Diastolic Congestive Heart Failure Volume stable.   Luna Glasgow, PA-C  11:21 AM 07/10/2011

## 2011-07-19 ENCOUNTER — Telehealth (HOSPITAL_COMMUNITY): Payer: Self-pay

## 2011-07-19 ENCOUNTER — Other Ambulatory Visit: Payer: Self-pay | Admitting: Cardiology

## 2011-07-19 NOTE — Telephone Encounter (Signed)
Called patient to verify how much metoprolol was she taking daily

## 2011-07-19 NOTE — Telephone Encounter (Signed)
..   Requested Prescriptions   Pending Prescriptions Disp Refills  . metoprolol (LOPRESSOR) 50 MG tablet [Pharmacy Med Name: METOPROLOL TARTA 50MG  TAB] 60 tablet 6    Sig: Take 1 tablet (50 mg total) by mouth 2 (two) times daily.  Patient states that  she takes 1 tab bid

## 2011-08-01 ENCOUNTER — Ambulatory Visit: Payer: Medicare Other | Admitting: Cardiology

## 2011-08-15 ENCOUNTER — Encounter: Payer: Self-pay | Admitting: Cardiology

## 2011-08-15 ENCOUNTER — Ambulatory Visit (INDEPENDENT_AMBULATORY_CARE_PROVIDER_SITE_OTHER): Payer: Medicare Other | Admitting: Cardiology

## 2011-08-15 VITALS — BP 122/64 | HR 70 | Resp 18 | Ht 65.0 in | Wt 172.0 lb

## 2011-08-15 DIAGNOSIS — I4891 Unspecified atrial fibrillation: Secondary | ICD-10-CM

## 2011-08-15 DIAGNOSIS — E7800 Pure hypercholesterolemia, unspecified: Secondary | ICD-10-CM

## 2011-08-15 DIAGNOSIS — I10 Essential (primary) hypertension: Secondary | ICD-10-CM

## 2011-08-15 DIAGNOSIS — I2510 Atherosclerotic heart disease of native coronary artery without angina pectoris: Secondary | ICD-10-CM

## 2011-08-15 NOTE — Assessment & Plan Note (Signed)
No current angina 

## 2011-08-15 NOTE — Assessment & Plan Note (Signed)
Followed by Dr. Shelton. 

## 2011-08-15 NOTE — Progress Notes (Signed)
HPI: The patient is in for followup. In general she is doing quite well. She has no new complaints. He drove in detail today her medications, and her sons would go home and call us back with some medication issues. We want to confirm whether or not she is taking additional benazepril.  No bleeding.    Current Outpatient Prescriptions  Medication Sig Dispense Refill  . amLODipine-benazepril (LOTREL) 5-20 MG per capsule Take 1 capsule by mouth daily.      Marland Kitchen aspirin 81 MG tablet Take 81 mg by mouth daily.      Marland Kitchen atorvastatin (LIPITOR) 20 MG tablet Take 20 mg by mouth every morning.      . benazepril (LOTENSIN) 20 MG tablet       . carbidopa-levodopa (SINEMET IR) 25-100 MG per tablet Take 2 tablets by mouth 3 (three) times daily.      . citalopram (CELEXA) 10 MG tablet       . dabigatran (PRADAXA) 150 MG CAPS Take 1 capsule (150 mg total) by mouth every 12 (twelve) hours.  60 capsule  6  . DETROL LA 4 MG 24 hr capsule       . diltiazem (CARDIZEM SR) 120 MG 12 hr capsule Take 1 capsule (120 mg total) by mouth every 12 (twelve) hours.  30 capsule  6  . hydrochlorothiazide (HYDRODIURIL) 12.5 MG tablet Take 12.5 mg by mouth daily.      . metoprolol (LOPRESSOR) 50 MG tablet Take 1 tablet (50 mg total) by mouth 2 (two) times daily.  60 tablet  6  . prednisoLONE acetate (PRED FORTE) 1 % ophthalmic suspension       . DISCONTD: metoprolol (LOPRESSOR) 50 MG tablet Take 1 tablet (50 mg total) by mouth 2 (two) times daily.  60 tablet  6    Allergies  Allergen Reactions  . Sulfonamide Derivatives     Unknown.    Past Medical History  Diagnosis Date  . 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 Hypertension   . Hypercholesteremia   . Obesity   . Impaired glucose tolerance   . Anxiety disorder   . Gastritis   . Osteoarthritis   . Heart murmur     from age 6 associated with some hemoptysis  . H/O:  hysterectomy   . Atrial fibrillation     a. new 04/2011;  b. Pradaxa  . Chronic diastolic heart failure     Echocardiogram 04/14/11: Moderate LVH, EF 60-65%, mild LAE.  Marland Kitchen CVA (cerebral infarction)     Past Surgical History  Procedure Date  . Carpal tunnel release     right  . Tonsillectomy   . Eye surgery     left  . Replacement total knee     right    Family History  Problem Relation Age of Onset  . Cancer Mother 51    died  . Heart attack Father 42    died    History   Social History  . Marital Status: Widowed    Spouse Name: N/A    Number of Children: 2  . Years of Education: N/A   Occupational History  . retired    Social History Main Topics  . Smoking status: Never Smoker   . Smokeless tobacco: Not on file  . Alcohol Use: No  . Drug Use: No  . Sexually Active: Not on file  Other Topics Concern  . Not on file   Social History Narrative  . No narrative on file    ROS: Please see the HPI.  All other systems reviewed and negative.  PHYSICAL EXAM:  BP 122/64  Pulse 70  Resp 18  Ht 5\' 5"  (1.651 m)  Wt 172 lb (78.019 kg)  BMI 28.62 kg/m2  General: Elderly female in no acute distress.   Head:  Normocephalic and atraumatic. Neck: no JVD Lungs: Clear to auscultation and percussion. Heart: Normal S1 and S2.  No murmur, rubs or gallops.  Pulses: Pulses normal in all 4 extremities. Extremities: No clubbing or cyanosis. No edema. Neurologic: Alert and oriented x 3.  EKG:  NSR.Marland Kitchen  No acute changes.    ASSESSMENT AND PLAN:

## 2011-08-15 NOTE — Patient Instructions (Addendum)
Your physician recommends that you have lab work today: BMP  Your physician wants you to follow-up in: 6 WEEKS.  You will receive a reminder letter in the mail two months in advance. If you don't receive a letter, please call our office to schedule the follow-up appointment.  Your physician recommends that you continue on your current medications as directed. Please refer to the Current Medication list given to you today.  The pt's son will call back to clarify if she is taking Benazepril 20mg  along with Lotrel.

## 2011-08-15 NOTE — Assessment & Plan Note (Addendum)
Currently maintaining NSR.  Continue current meds.

## 2011-08-15 NOTE — Assessment & Plan Note (Signed)
Seemingly well controlled.  We need to check on her current dose of benazepril, and her son will check and call us back.

## 2011-08-16 ENCOUNTER — Other Ambulatory Visit: Payer: Self-pay

## 2011-08-16 LAB — BASIC METABOLIC PANEL
BUN: 21 mg/dL (ref 6–23)
CO2: 25 mEq/L (ref 19–32)
Calcium: 9.5 mg/dL (ref 8.4–10.5)
Chloride: 110 mEq/L (ref 96–112)
Creatinine, Ser: 1 mg/dL (ref 0.4–1.2)
GFR: 66.71 mL/min (ref >60.00)
Glucose, Bld: 96 mg/dL (ref 70–99)
Potassium: 4.4 mEq/L (ref 3.5–5.1)
Sodium: 142 mEq/L (ref 135–145)

## 2011-10-04 ENCOUNTER — Emergency Department (HOSPITAL_COMMUNITY): Payer: Medicare Other

## 2011-10-04 ENCOUNTER — Ambulatory Visit: Payer: Medicare Other | Admitting: Cardiology

## 2011-10-04 ENCOUNTER — Emergency Department (HOSPITAL_COMMUNITY)
Admission: EM | Admit: 2011-10-04 | Discharge: 2011-10-04 | Disposition: A | Discharge status: Home / Self Care | Payer: Medicare Other | Attending: Emergency Medicine | Admitting: Emergency Medicine

## 2011-10-04 ENCOUNTER — Encounter (HOSPITAL_COMMUNITY): Payer: Self-pay | Admitting: Emergency Medicine

## 2011-10-04 DIAGNOSIS — F29 Unspecified psychosis not due to a substance or known physiological condition: Secondary | ICD-10-CM | POA: Insufficient documentation

## 2011-10-04 DIAGNOSIS — W19XXXA Unspecified fall, initial encounter: Secondary | ICD-10-CM | POA: Insufficient documentation

## 2011-10-04 DIAGNOSIS — M7989 Other specified soft tissue disorders: Secondary | ICD-10-CM | POA: Insufficient documentation

## 2011-10-04 DIAGNOSIS — T1490XA Injury, unspecified, initial encounter: Secondary | ICD-10-CM | POA: Insufficient documentation

## 2011-10-04 DIAGNOSIS — Z8673 Personal history of transient ischemic attack (TIA), and cerebral infarction without residual deficits: Secondary | ICD-10-CM | POA: Insufficient documentation

## 2011-10-04 DIAGNOSIS — R42 Dizziness and giddiness: Secondary | ICD-10-CM | POA: Insufficient documentation

## 2011-10-04 DIAGNOSIS — Y92009 Unspecified place in unspecified non-institutional (private) residence as the place of occurrence of the external cause: Secondary | ICD-10-CM | POA: Insufficient documentation

## 2011-10-04 DIAGNOSIS — I10 Essential (primary) hypertension: Secondary | ICD-10-CM | POA: Insufficient documentation

## 2011-10-04 DIAGNOSIS — I2510 Atherosclerotic heart disease of native coronary artery without angina pectoris: Secondary | ICD-10-CM | POA: Insufficient documentation

## 2011-10-04 MED ORDER — HYDROCODONE-ACETAMINOPHEN 5-325 MG PO TABS
1.0000 | ORAL_TABLET | Freq: Once | ORAL | Status: AC
  Administered 2011-10-04: 1 via ORAL
  Filled 2011-10-04: qty 1

## 2011-10-04 NOTE — ED Notes (Signed)
Patient was going to use the bathroom and stated "legs gave out"  Per EMS. Denies loc alert and answering following commands appropriate.

## 2011-10-04 NOTE — ED Notes (Signed)
Ambulated patient  

## 2011-10-04 NOTE — ED Provider Notes (Signed)
History     CSN: [id number]  Arrival date & time 10/04/11  1018   First MD Initiated Contact with Patient 10/04/11 1020      Chief Complaint  Patient presents with  . Fall    (Consider location/radiation/quality/duration/timing/severity/associated sxs/prior treatment) HPI Comments: Pt comes in w/ cc of all. Pt states that her leg gave out earlier today, and she sustained a fall on to a carpeted surface. Pt is on pradaxa. Pt has no nausea, vomiting, visual complains, seizures, altered mental status, loss of consciousness, new weakness, or numbness, no gait instability. Pt does have some left knee pain and femur pain. She hasn't ambulated since the fall.   Patient is a 71 y.o. female presenting with fall. The history is provided by the patient.  Fall Pertinent negatives include no abdominal pain, no nausea, no vomiting and no headaches.    Past Medical History  Diagnosis Date  . 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 Hypertension   . Hypercholesteremia   . Obesity   . Impaired glucose tolerance   . Anxiety disorder   . Gastritis   . Osteoarthritis   . Heart murmur     from age 73 associated with some hemoptysis  . H/O: hysterectomy   . Atrial fibrillation     a. new 04/2011;  b. Pradaxa  . Chronic diastolic heart failure     Echocardiogram 04/14/11: Moderate LVH, EF 60-65%, mild LAE.  Marland Kitchen CVA (cerebral infarction)     Past Surgical History  Procedure Date  . Carpal tunnel release     right  . Tonsillectomy   . Eye surgery     left  . Replacement total knee     right    Family History  Problem Relation Age of Onset  . Cancer Mother 62    died  . Heart attack Father 84    died    History  Substance Use Topics  . Smoking status: Never Smoker   . Smokeless tobacco: Not on file  . Alcohol Use: No    OB History    Grav Para Term Preterm Abortions TAB SAB Ect Mult Living                   Review of Systems  Constitutional: Negative for activity change.  HENT: Negative for neck pain.   Respiratory: Negative for shortness of breath.   Cardiovascular: Negative for chest pain.  Gastrointestinal: Negative for nausea, vomiting and abdominal pain.  Genitourinary: Negative for dysuria.  Musculoskeletal: Positive for joint swelling, arthralgias and gait problem. Negative for back pain.  Neurological: Negative for headaches.    Allergies  Sulfonamide derivatives  Home Medications   Current Outpatient Rx  Name Route Sig Dispense Refill  . AMLODIPINE BESY-BENAZEPRIL HCL 5-20 MG PO CAPS Oral Take 1 capsule by mouth daily.    . ASPIRIN 81 MG PO TABS Oral Take 81 mg by mouth daily.    . ATORVASTATIN CALCIUM 20 MG PO TABS Oral Take 20 mg by mouth every morning.    Marland Kitchen BENAZEPRIL HCL 20 MG PO TABS Oral Take 20 mg by mouth daily.    Marland Kitchen CARBIDOPA-LEVODOPA 25-100 MG PO TABS Oral Take 2 tablets by mouth 3 (three) times daily.    Marland Kitchen CITALOPRAM HYDROBROMIDE 10 MG PO TABS Oral Take 10 mg by mouth daily.     Marland Kitchen DABIGATRAN ETEXILATE MESYLATE 150  MG PO CAPS Oral Take 1 capsule (150 mg total) by mouth every 12 (twelve) hours. 60 capsule 6  . DETROL LA 4 MG PO CP24 Oral Take 4 mg by mouth daily.     Marland Kitchen DILTIAZEM HCL ER 120 MG PO CP12 Oral Take 1 capsule (120 mg total) by mouth every 12 (twelve) hours. 30 capsule 6  . HYDROCHLOROTHIAZIDE 12.5 MG PO TABS Oral Take 12.5 mg by mouth daily.    Marland Kitchen METOPROLOL TARTRATE 50 MG PO TABS Oral Take 1 tablet (50 mg total) by mouth 2 (two) times daily. 60 tablet 6  . PREDNISOLONE ACETATE 1 % OP SUSP Left Eye Place 1 drop into the left eye 2 (two) times daily.       BP 131/79  Pulse 57  Temp 99.3 F (37.4 C) (Oral)  Resp 12  SpO2 98%  Physical Exam  Nursing note and vitals reviewed. Constitutional: She is oriented to person, place, and time. She appears well-developed and well-nourished.  HENT:  Head: Normocephalic and atraumatic.  Eyes:  EOM are normal. Pupils are equal, round, and reactive to light.  Neck: Neck supple.  Cardiovascular: Normal rate, regular rhythm and normal heart sounds.   No murmur heard. Pulmonary/Chest: Effort normal. No respiratory distress.  Abdominal: Soft. She exhibits no distension. There is no tenderness. There is no rebound and no guarding.  Musculoskeletal: She exhibits tenderness.       Left knee slightly swollen, ROM is WNL, pt has medial tenderness, with negative mcmurray, anterior drawes. Distal femur tenderness on the left side with no obvious deformity.  Neurological: She is alert and oriented to person, place, and time.  Skin: Skin is warm and dry.    ED Course  Procedures (including critical care time)  Labs Reviewed - No data to display Dg Pelvis 1-2 Views  10/04/2011  *RADIOLOGY REPORT*  Clinical Data: Fall.  Confusion.  PELVIS - 1-2 VIEW  Comparison: 11/22/2004  Findings: Degenerative spurring of the sacroiliac joints noted. Lower lumbar spondylosis is observed.  A discrete pelvic fractures not identified.  IMPRESSION:  1.  No acute bony findings identified. 2.  Bilateral sacroiliac joint spurring and lower lumbar spondylosis.   Original Report Authenticated By: Dellia Cloud, M.D.    Dg Femur Left  10/04/2011  *RADIOLOGY REPORT*  Clinical Data: Fall.  Swelling of the leg.  LEFT FEMUR - 2 VIEW  Comparison: 11/22/2004 CT the pelvis.  Findings: Prominent degenerative spurring in the knee noted. Minimal acetabular spurring is present.  No femoral fracture observed. Mild left sacroiliac joint spurring noted.  IMPRESSION:  1.  No acute bony findings. 2.  Degenerative spurring in the knee.   Original Report Authenticated By: Dellia Cloud, M.D.    Dg Knee 2 Views Left  10/04/2011  *RADIOLOGY REPORT*  Clinical Data: Fall.  Swelling in leg.  Confusion.  LEFT KNEE - 1-2 VIEW  Comparison: None.  Findings: Severe osteoarthritis with prominent proliferative bony spurring.  Suspected  chronic fragmentation and spurring appears well corticated.  No significant knee effusion.  Prominent medial compartmental loss of articular space noted.  IMPRESSION:  1.  Osteoarthritis with severe proliferative bony spurring.  No acute findings.   Original Report Authenticated By: Dellia Cloud, M.D.    Ct Head Wo Contrast  10/04/2011  *RADIOLOGY REPORT*  Clinical Data: Dizziness.  Fall  CT HEAD WITHOUT CONTRAST  Technique:  Contiguous axial images were obtained from the base of the skull through the vertex without contrast.  Comparison: None.  Findings: Generalized atrophy.  Moderate to advanced chronic microvascular ischemic changes in the white matter.  No acute infarct.  Negative for intracranial hemorrhage or mass.  No shift of  the midline structures.  Ventricles are not enlarged.  Negative for skull fracture.  Chronic atrophy and calcification of the left eye.  IMPRESSION: Atrophy and chronic ischemic changes in the white matter.  No acute abnormality.   Original Report Authenticated By: Camelia Phenes, M.D.      No diagnosis found.    MDM  DDx includes: - Mechanical falls - ICH - Fractures - Contusions - Soft tissue injury  Pt comes in post  Fall. On Cherylann Parr - will get CT head. No red flags on hx or exam for ICH. Pt has no bony tenderness above the hips. Pt will get appropriate imaging of the lower extremities.     12:55 PM Pt able to ambulate, with some difficulty, but that appears to be baseline difficulty as reported to [place] by patient and family. No fractures, will d/c.  Derwood Kaplan, MD 10/04/11 [phone]

## 2011-10-08 ENCOUNTER — Telehealth: Payer: Self-pay | Admitting: Cardiology

## 2011-10-08 NOTE — Telephone Encounter (Signed)
I spoke with [name] in the pharmacy and she said that the only medication that needs a refill is the pt's eye drops.  I spoke with the pt and made her aware of this information. I also spoke with the pt about Benazepril and she verified that she is not taking this medication.

## 2011-10-08 NOTE — Telephone Encounter (Signed)
Please return call to patient [phone], she need a med refill but doesn't know what medication she needs it for.

## 2011-10-24 ENCOUNTER — Encounter: Payer: Self-pay | Admitting: Cardiology

## 2011-10-24 ENCOUNTER — Ambulatory Visit (INDEPENDENT_AMBULATORY_CARE_PROVIDER_SITE_OTHER): Payer: Medicare Other | Admitting: Cardiology

## 2011-10-24 VITALS — BP 138/76 | HR 58 | Ht 62.0 in | Wt 168.0 lb

## 2011-10-24 DIAGNOSIS — I4891 Unspecified atrial fibrillation: Secondary | ICD-10-CM

## 2011-10-24 DIAGNOSIS — I2510 Atherosclerotic heart disease of native coronary artery without angina pectoris: Secondary | ICD-10-CM

## 2011-10-24 DIAGNOSIS — E7800 Pure hypercholesterolemia, unspecified: Secondary | ICD-10-CM

## 2011-10-24 DIAGNOSIS — I10 Essential (primary) hypertension: Secondary | ICD-10-CM

## 2011-10-24 LAB — BASIC METABOLIC PANEL
BUN: 21 mg/dL (ref 6–23)
CO2: 26 mEq/L (ref 19–32)
Calcium: 9.5 mg/dL (ref 8.4–10.5)
Chloride: 105 mEq/L (ref 96–112)
Creatinine, Ser: 0.9 mg/dL (ref 0.4–1.2)
GFR: 76.81 mL/min (ref >60.00)
Glucose, Bld: 87 mg/dL (ref 70–99)
Potassium: 4.4 mEq/L (ref 3.5–5.1)
Sodium: 139 mEq/L (ref 135–145)

## 2011-10-24 LAB — CBC WITH DIFFERENTIAL/PLATELET
Basophils Absolute: 0 10*3/uL (ref 0.0–0.1)
Basophils Relative: 0.4 % (ref 0.0–3.0)
Eosinophils Absolute: 0.4 10*3/uL (ref 0.0–0.7)
Eosinophils Relative: 4.4 % (ref 0.0–5.0)
HCT: 42.2 % (ref 36.0–46.0)
Hemoglobin: 13.5 g/dL (ref 12.0–15.0)
Lymphocytes Relative: 26.1 % (ref 12.0–46.0)
Lymphs Abs: 2.6 10*3/uL (ref 0.7–4.0)
MCHC: 32 g/dL (ref 30.0–36.0)
MCV: 83.7 fl (ref 78.0–100.0)
Monocytes Absolute: 0.7 10*3/uL (ref 0.1–1.0)
Monocytes Relative: 7.4 % (ref 3.0–12.0)
Neutro Abs: 6.1 10*3/uL (ref 1.4–7.7)
Neutrophils Relative %: 61.7 % (ref 43.0–77.0)
Platelets: 264 10*3/uL (ref 150.0–400.0)
RBC: 5.04 Mil/uL (ref 3.87–5.11)
RDW: 15.5 % — ABNORMAL HIGH (ref 11.5–14.6)
WBC: 10 10*3/uL (ref 4.5–10.5)

## 2011-10-24 MED ORDER — DABIGATRAN ETEXILATE MESYLATE 150 MG PO CAPS: 150.0000 mg | ORAL_CAPSULE | Freq: Two times a day (BID) | ORAL | Status: DC

## 2011-10-24 NOTE — Assessment & Plan Note (Signed)
Underwent cath in March.  See results.  No chest pain.  Continue medical therapy.

## 2011-10-24 NOTE — Progress Notes (Signed)
HPI:  Patient returns for followup. She's doing extremely well. She's not had any major symptoms. She's tolerating her medicines without difficulty. She denies any bleeding. She did fall down in her living room, but did not hit her head. She'll probably have to go to the emergency room. Otherwise, she is perfectly stable at this point in time.  Current Outpatient Prescriptions  Medication Sig Dispense Refill  . amLODipine-benazepril (LOTREL) 5-20 MG per capsule Take 1 capsule by mouth daily.      Marland Kitchen aspirin 81 MG tablet Take 81 mg by mouth daily.      Marland Kitchen atorvastatin (LIPITOR) 20 MG tablet Take 20 mg by mouth every morning.      . citalopram (CELEXA) 10 MG tablet Take 10 mg by mouth daily.       . dabigatran (PRADAXA) 150 MG CAPS Take 1 capsule (150 mg total) by mouth every 12 (twelve) hours.  60 capsule  6  . diltiazem (CARDIZEM SR) 120 MG 12 hr capsule Take 1 capsule (120 mg total) by mouth every 12 (twelve) hours.  30 capsule  6  . metoprolol (LOPRESSOR) 50 MG tablet Take 1 tablet (50 mg total) by mouth 2 (two) times daily.  60 tablet  6  . prednisoLONE acetate (PRED FORTE) 1 % ophthalmic suspension Place 1 drop into the left eye 2 (two) times daily.       Marland Kitchen DISCONTD: dabigatran (PRADAXA) 150 MG CAPS Take 1 capsule (150 mg total) by mouth every 12 (twelve) hours.  60 capsule  6    Allergies  Allergen Reactions  . Sulfonamide Derivatives     Unknown.    Past Medical History  Diagnosis Date  . 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 Hypertension   . Hypercholesteremia   . Obesity   . Impaired glucose tolerance   . Anxiety disorder   . Gastritis   . Osteoarthritis   . Heart murmur     from age 52 associated with some hemoptysis  . H/O: hysterectomy   . Atrial fibrillation     a. new 04/2011;  b. Pradaxa  . Chronic diastolic heart failure     Echocardiogram 04/14/11: Moderate LVH, EF 60-65%,  mild LAE.  Marland Kitchen CVA (cerebral infarction)     Past Surgical History  Procedure Date  . Carpal tunnel release     right  . Tonsillectomy   . Eye surgery     left  . Replacement total knee     right    Family History  Problem Relation Age of Onset  . Cancer Mother 16    died  . Heart attack Father 22    died    History   Social History  . Marital Status: Widowed    Spouse Name: N/A    Number of Children: 2  . Years of Education: N/A   Occupational History  . retired    Social History Main Topics  . Smoking status: Never Smoker   . Smokeless tobacco: Not on file  . Alcohol Use: No  . Drug Use: No  . Sexually Active: Not on file   Other Topics Concern  . Not on file   Social History Narrative  . No narrative on file    ROS: Please see the HPI.  All other systems reviewed and negative.  PHYSICAL EXAM:  BP 138/76  Pulse 58  Ht 5'  2" (1.575 m)  Wt 168 lb (76.204 kg)  BMI 30.73 kg/m2  SpO2 98%  General: Well developed, well nourished, in no acute distress.  Sitting in wheelchair   Normocephalic and atraumatic. Neck: no JVD Lungs: Clear to auscultation and percussion. Heart: Normal S1 and S2.  S4 gallop.  Pulses: Pulses normal in all 4 extremities. Extremities: No clubbing or cyanosis. No edema. Neurologic: Alert and oriented x 3.  EKG:  SB with PAC.  i RBBB.  Borderline ECG.

## 2011-10-24 NOTE — Patient Instructions (Addendum)
Your physician recommends that you have lab work today: BMP and CBC  Your physician recommends that you schedule a follow-up appointment in: 3 MONTHS  Your physician recommends that you continue on your current medications as directed. Please refer to the Current Medication list given to you today.

## 2011-10-24 NOTE — Assessment & Plan Note (Signed)
Followed by Dr. Shelton. 

## 2011-10-24 NOTE — Assessment & Plan Note (Signed)
In NSR.  On Pradaxa.  No clinical bleeding. Will check CBC.

## 2011-10-24 NOTE — Assessment & Plan Note (Signed)
Improved BP control

## 2011-11-29 ENCOUNTER — Encounter: Payer: Self-pay | Admitting: Cardiology

## 2012-01-21 ENCOUNTER — Encounter: Payer: Self-pay | Admitting: Cardiology

## 2012-01-21 ENCOUNTER — Ambulatory Visit (INDEPENDENT_AMBULATORY_CARE_PROVIDER_SITE_OTHER): Payer: Medicare Other | Admitting: Cardiology

## 2012-01-21 VITALS — BP 114/70 | HR 63 | Ht 64.0 in | Wt 168.0 lb

## 2012-01-21 DIAGNOSIS — I4891 Unspecified atrial fibrillation: Secondary | ICD-10-CM

## 2012-01-21 DIAGNOSIS — I10 Essential (primary) hypertension: Secondary | ICD-10-CM

## 2012-01-21 DIAGNOSIS — I2510 Atherosclerotic heart disease of native coronary artery without angina pectoris: Secondary | ICD-10-CM

## 2012-01-21 NOTE — Patient Instructions (Addendum)
Your physician recommends that you schedule a follow-up appointment in: February 2014  Your physician recommends that you continue on your current medications as directed. Please refer to the Current Medication list given to you today.

## 2012-01-21 NOTE — Assessment & Plan Note (Signed)
No current symptoms.  Continue medical therapy.

## 2012-01-21 NOTE — Progress Notes (Signed)
HPI:   The patient is in for followup. She think she is doing extremely well. She does get up, and move about in her walker a regular basis. She denies any ongoing chest pain. She's not had any noticeable bleeding. She denies any shortness of breath.  Current Outpatient Prescriptions  Medication Sig Dispense Refill  . amLODipine-benazepril (LOTREL) 5-20 MG per capsule Take 1 capsule by mouth daily.      Marland Kitchen aspirin 81 MG tablet Take 81 mg by mouth daily.      Marland Kitchen atorvastatin (LIPITOR) 20 MG tablet Take 20 mg by mouth every morning.      . citalopram (CELEXA) 10 MG tablet Take 10 mg by mouth daily.       . dabigatran (PRADAXA) 150 MG CAPS Take 1 capsule (150 mg total) by mouth every 12 (twelve) hours.  60 capsule  6  . diltiazem (CARDIZEM SR) 120 MG 12 hr capsule Take 1 capsule (120 mg total) by mouth every 12 (twelve) hours.  30 capsule  6  . metoprolol (LOPRESSOR) 50 MG tablet Take 1 tablet (50 mg total) by mouth 2 (two) times daily.  60 tablet  6    Allergies  Allergen Reactions  . Sulfonamide Derivatives     Unknown.    Past Medical History  Diagnosis Date  . 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 Hypertension   . Hypercholesteremia   . Obesity   . Impaired glucose tolerance   . Anxiety disorder   . Gastritis   . Osteoarthritis   . Heart murmur     from age 53 associated with some hemoptysis  . H/O: hysterectomy   . Atrial fibrillation     a. new 04/2011;  b. Pradaxa  . Chronic diastolic heart failure     Echocardiogram 04/14/11: Moderate LVH, EF 60-65%, mild LAE.  Marland Kitchen CVA (cerebral infarction)     Past Surgical History  Procedure Date  . Carpal tunnel release     right  . Tonsillectomy   . Eye surgery     left  . Replacement total knee     right    Family History  Problem Relation Age of Onset  . Cancer Mother 62    died  . Heart attack Father 6    died    History    Social History  . Marital Status: Widowed    Spouse Name: N/A    Number of Children: 2  . Years of Education: N/A   Occupational History  . retired    Social History Main Topics  . Smoking status: Never Smoker   . Smokeless tobacco: Not on file  . Alcohol Use: No  . Drug Use: No  . Sexually Active: Not on file   Other Topics Concern  . Not on file   Social History Narrative  . No narrative on file    ROS: Please see the HPI.  All other systems reviewed and negative.  PHYSICAL EXAM:  BP 114/70  Pulse 63  Ht 5\' 4"  (1.626 m)  Wt 168 lb (76.204 kg)  BMI 28.84 kg/m2  SpO2 96%  General: Well developed, well nourished, in no acute distress. Head:  Normocephalic and atraumatic. Neck: no JVD Lungs: Clear to auscultation and percussion. Heart: Normal S1 and S2.  No murmur, rubs or gallops.  Pulses: Pulses normal in all 4 extremities. Extremities: No clubbing or  cyanosis. No edema. Neurologic: Alert and oriented x 3.  EKG:  NSR.  Sinus arrhythmia.    ASSESSMENT AND PLAN:

## 2012-01-21 NOTE — Assessment & Plan Note (Addendum)
Well controlled on meds.  Will check BMET when returns in February.

## 2012-01-21 NOTE — Assessment & Plan Note (Signed)
Remains on Pradaxa.  Doing pretty well.

## 2012-03-22 ENCOUNTER — Encounter (HOSPITAL_COMMUNITY): Payer: Self-pay | Admitting: *Deleted

## 2012-03-22 ENCOUNTER — Emergency Department (HOSPITAL_COMMUNITY): Payer: Medicare Other

## 2012-03-22 ENCOUNTER — Emergency Department (HOSPITAL_COMMUNITY)
Admission: EM | Admit: 2012-03-22 | Discharge: 2012-03-22 | Disposition: A | Discharge status: Home / Self Care | Payer: Medicare Other | Attending: Emergency Medicine | Admitting: Emergency Medicine

## 2012-03-22 DIAGNOSIS — R011 Cardiac murmur, unspecified: Secondary | ICD-10-CM | POA: Insufficient documentation

## 2012-03-22 DIAGNOSIS — Z8739 Personal history of other diseases of the musculoskeletal system and connective tissue: Secondary | ICD-10-CM | POA: Insufficient documentation

## 2012-03-22 DIAGNOSIS — Z8679 Personal history of other diseases of the circulatory system: Secondary | ICD-10-CM | POA: Insufficient documentation

## 2012-03-22 DIAGNOSIS — I5032 Chronic diastolic (congestive) heart failure: Secondary | ICD-10-CM | POA: Insufficient documentation

## 2012-03-22 DIAGNOSIS — I471 Supraventricular tachycardia: Secondary | ICD-10-CM

## 2012-03-22 DIAGNOSIS — Z79899 Other long term (current) drug therapy: Secondary | ICD-10-CM | POA: Insufficient documentation

## 2012-03-22 DIAGNOSIS — E7800 Pure hypercholesterolemia, unspecified: Secondary | ICD-10-CM | POA: Insufficient documentation

## 2012-03-22 DIAGNOSIS — I10 Essential (primary) hypertension: Secondary | ICD-10-CM | POA: Insufficient documentation

## 2012-03-22 DIAGNOSIS — Z90710 Acquired absence of both cervix and uterus: Secondary | ICD-10-CM | POA: Insufficient documentation

## 2012-03-22 DIAGNOSIS — I2510 Atherosclerotic heart disease of native coronary artery without angina pectoris: Secondary | ICD-10-CM | POA: Insufficient documentation

## 2012-03-22 DIAGNOSIS — Z8673 Personal history of transient ischemic attack (TIA), and cerebral infarction without residual deficits: Secondary | ICD-10-CM | POA: Insufficient documentation

## 2012-03-22 DIAGNOSIS — E669 Obesity, unspecified: Secondary | ICD-10-CM | POA: Insufficient documentation

## 2012-03-22 DIAGNOSIS — Z8719 Personal history of other diseases of the digestive system: Secondary | ICD-10-CM | POA: Insufficient documentation

## 2012-03-22 DIAGNOSIS — Z7982 Long term (current) use of aspirin: Secondary | ICD-10-CM | POA: Insufficient documentation

## 2012-03-22 DIAGNOSIS — F411 Generalized anxiety disorder: Secondary | ICD-10-CM | POA: Insufficient documentation

## 2012-03-22 DIAGNOSIS — I498 Other specified cardiac arrhythmias: Secondary | ICD-10-CM | POA: Insufficient documentation

## 2012-03-22 HISTORY — DX: Cerebral infarction, unspecified: I63.9

## 2012-03-22 LAB — CBC WITH DIFFERENTIAL/PLATELET
Basophils Absolute: 0 10*3/uL (ref 0.0–0.1)
Basophils Relative: 0 % (ref 0–1)
Eosinophils Absolute: 0.4 10*3/uL (ref 0.0–0.7)
Eosinophils Relative: 4 % (ref 0–5)
HCT: 40.8 % (ref 36.0–46.0)
Hemoglobin: 13.4 g/dL (ref 12.0–15.0)
Lymphocytes Relative: 22 % (ref 12–46)
Lymphs Abs: 2.1 10*3/uL (ref 0.7–4.0)
MCH: 27.6 pg (ref 26.0–34.0)
MCHC: 32.8 g/dL (ref 30.0–36.0)
MCV: 84.1 fL (ref 78.0–100.0)
Monocytes Absolute: 0.8 10*3/uL (ref 0.1–1.0)
Monocytes Relative: 9 % (ref 3–12)
Neutro Abs: 6.1 10*3/uL (ref 1.7–7.7)
Neutrophils Relative %: 65 % (ref 43–77)
Platelets: 280 10*3/uL (ref 150–400)
RBC: 4.85 MIL/uL (ref 3.87–5.11)
RDW: 14.5 % (ref 11.5–15.5)
WBC: 9.5 10*3/uL (ref 4.0–10.5)

## 2012-03-22 LAB — TROPONIN I: Troponin I: <0.3 ng/mL (ref <0.30)

## 2012-03-22 LAB — BASIC METABOLIC PANEL
BUN: 35 mg/dL — ABNORMAL HIGH (ref 6–23)
CO2: 22 mEq/L (ref 19–32)
Calcium: 9.2 mg/dL (ref 8.4–10.5)
Chloride: 111 mEq/L (ref 96–112)
Creatinine, Ser: 1.12 mg/dL — ABNORMAL HIGH (ref 0.50–1.10)
GFR calc Af Amer: 55 mL/min — ABNORMAL LOW (ref >90)
GFR calc non Af Amer: 47 mL/min — ABNORMAL LOW (ref >90)
Glucose, Bld: 127 mg/dL — ABNORMAL HIGH (ref 70–99)
Potassium: 3.9 mEq/L (ref 3.5–5.1)
Sodium: 143 mEq/L (ref 135–145)

## 2012-03-22 MED ORDER — SODIUM CHLORIDE 0.9 % IV BOLUS (SEPSIS): 500.0000 mL | Freq: Once | INTRAVENOUS | Status: DC

## 2012-03-22 NOTE — ED Notes (Addendum)
MD at [facility]. Family at [facility] x2.

## 2012-03-22 NOTE — ED Notes (Signed)
Pt alert, NAD, calm, interactive, resps e/u, speaking in clear complete sentences. Denies pain sob nausea dizziness. Family at [facility]. Pending disposition.

## 2012-03-22 NOTE — ED Provider Notes (Signed)
History     CSN: [id number]  Arrival date & time 03/22/12  1704   First MD Initiated Contact with Patient 03/22/12 1718      Chief Complaint  Patient presents with  . Shortness of Breath   HPI [name] is a 72 y.o. female who presents to the ED after episode of SOB/CP.  Patient reports that she had about an hour of CP and SOB.  Crushing pain.  EMS called and patient found to be in SVT.  Patient performed vagal maneuvers and immediately converted to sinus rhythm.    Past Medical History  Diagnosis Date  . 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 Hypertension   . Hypercholesteremia   . Obesity   . Impaired glucose tolerance   . Anxiety disorder   . Gastritis   . Osteoarthritis   . Heart murmur     from age 29 associated with some hemoptysis  . H/O: hysterectomy   . Atrial fibrillation     a. new 04/2011;  b. Pradaxa  . Chronic diastolic heart failure     Echocardiogram 04/14/11: Moderate LVH, EF 60-65%, mild LAE.  Marland Kitchen CVA (cerebral infarction)   . Stroke     Past Surgical History  Procedure Laterality Date  . Carpal tunnel release      right  . Tonsillectomy    . Eye surgery      left  . Replacement total knee      right    Family History  Problem Relation Age of Onset  . Cancer Mother 71    died  . Heart attack Father 54    died    History  Substance Use Topics  . Smoking status: Never Smoker   . Smokeless tobacco: Not on file  . Alcohol Use: No    Review of Systems  Constitutional: Negative for fever and chills.  HENT: Negative for congestion, rhinorrhea, neck pain and neck stiffness.   Respiratory: Negative for cough and shortness of breath.   Cardiovascular: Negative for chest pain.  Gastrointestinal: Negative for nausea, vomiting, abdominal pain, diarrhea and abdominal distention.  Endocrine: Negative for polyuria.  Genitourinary: Negative for dysuria.   Skin: Negative for rash.  Neurological: Negative for headaches.  Psychiatric/Behavioral: Negative.   All other systems reviewed and are negative.    Allergies  Sulfonamide derivatives  Home Medications   Current Outpatient Rx  Name  Route  Sig  Dispense  Refill  . amLODipine-benazepril (LOTREL) 5-20 MG per capsule   Oral   Take 1 capsule by mouth daily.         Marland Kitchen aspirin 81 MG tablet   Oral   Take 81 mg by mouth daily.         Marland Kitchen atorvastatin (LIPITOR) 20 MG tablet   Oral   Take 20 mg by mouth every morning.         . citalopram (CELEXA) 10 MG tablet   Oral   Take 10 mg by mouth daily.          . dabigatran (PRADAXA) 150 MG CAPS   Oral   Take 1 capsule (150 mg total) by mouth every 12 (twelve) hours.   60 capsule   6   . diltiazem (CARDIZEM SR) 120 MG 12 hr capsule   Oral   Take 1 capsule (120 mg total) by mouth every 12 (twelve) hours.  30 capsule   6   . metoprolol (LOPRESSOR) 50 MG tablet   Oral   Take 1 tablet (50 mg total) by mouth 2 (two) times daily.   60 tablet   6     BP 128/75  Pulse 74  Temp(Src) 98.5 F (36.9 C) (Oral)  SpO2 100%  Physical Exam  Nursing note and vitals reviewed. Constitutional: She is oriented to person, place, and time. She appears well-developed and well-nourished. No distress.  HENT:  Head: Normocephalic and atraumatic.  Right Ear: External ear normal.  Left Ear: External ear normal.  Nose: Nose normal.  Mouth/Throat: Oropharynx is clear and moist. No oropharyngeal exudate.  Eyes: EOM are normal. Pupils are equal, round, and reactive to light.  Neck: Normal range of motion. Neck supple. No tracheal deviation present.  Cardiovascular: Normal rate.   Pulmonary/Chest: Effort normal and breath sounds normal. No stridor. No respiratory distress. She has no wheezes. She has no rales.  Abdominal: Soft. She exhibits no distension. There is no tenderness. There is no rebound.  Musculoskeletal: Normal range of  motion.  Neurological: She is alert and oriented to person, place, and time.  Skin: Skin is warm and dry. She is not diaphoretic.    ED Course  Procedures (including critical care time)  Labs Reviewed - No data to display No results found.   No diagnosis found.   Date: 03/22/2012  Rate: 77  Rhythm: normal sinus rhythm  QRS Axis: normal  Intervals: normal  ST/T Wave abnormalities: normal  Conduction Disutrbances:none  Narrative Interpretation:   Old EKG Reviewed: unchanged    MDM   [name] is a 72 y.o. female with history of CAD and atrial fibrillation who presented to the ED after episode of SVT.  Patient now asymptomatic.  Spoke with patient's cardiology group and wanting to f/u in clinic.  Patient already with f/u scheduled tomorrow.  Patient safe for discharge.  No further CP.  Asymptomatic with normal vitals.  Patient discharged.       Arloa Koh, MD 03/23/12 [phone]

## 2012-03-22 NOTE — ED Notes (Signed)
Pt states that she has top/bottom dentures in.

## 2012-03-22 NOTE — ED Notes (Signed)
Family at bedside. 

## 2012-03-22 NOTE — ED Notes (Signed)
Pt arrived by [facility]. Called ems due to sob and mild chest discomfort, pt was afraid she was having a stroke. On ems arrival pt was dizzy, weak radial pulse, HR was 170-175 on monitor. IV was started and pt was able to vagal and HR came down to 85 pta.

## 2012-03-22 NOTE — ED Provider Notes (Signed)
I saw and evaluated the patient, reviewed the resident's note and I agree with the findings and plan.  I reviewed and agree with ECG interpretation.  Pt with h/o non obstructive CAD based on Dr. Rosalyn Charters cath in March 2013, had suddden CP and SOB today.  EMS report states she had a tachydysrrthmia in the 170's and she resolved with vagal maneuver.  She reports she still has slight chest pressure/tighness.  Troponin times 1 neg, ECG shows NSR now, no acute ischemic changes.    Plan to discuss with [facility] cardiology.  Impression: SVT, resolved Chest tightness H/o non obstructive CAD     I reviewed EMS run form and evaluated their rhythm strip and interpreted it to be SVT.  Pt was pain free at discahrge.  Gavin Pound. Oletta Lamas, MD 03/23/12 [phone]

## 2012-03-23 ENCOUNTER — Ambulatory Visit (INDEPENDENT_AMBULATORY_CARE_PROVIDER_SITE_OTHER): Payer: Medicare Other | Admitting: Cardiology

## 2012-03-23 ENCOUNTER — Telehealth: Payer: Self-pay | Admitting: *Deleted

## 2012-03-23 ENCOUNTER — Other Ambulatory Visit: Payer: Self-pay | Admitting: *Deleted

## 2012-03-23 ENCOUNTER — Encounter (INDEPENDENT_AMBULATORY_CARE_PROVIDER_SITE_OTHER): Payer: Medicare Other

## 2012-03-23 ENCOUNTER — Encounter: Payer: Self-pay | Admitting: Cardiology

## 2012-03-23 VITALS — BP 114/69 | HR 63 | Ht 64.0 in | Wt 169.0 lb

## 2012-03-23 DIAGNOSIS — I471 Supraventricular tachycardia, unspecified: Secondary | ICD-10-CM

## 2012-03-23 DIAGNOSIS — I498 Other specified cardiac arrhythmias: Secondary | ICD-10-CM

## 2012-03-23 DIAGNOSIS — I4891 Unspecified atrial fibrillation: Secondary | ICD-10-CM

## 2012-03-23 DIAGNOSIS — I10 Essential (primary) hypertension: Secondary | ICD-10-CM

## 2012-03-23 DIAGNOSIS — I2510 Atherosclerotic heart disease of native coronary artery without angina pectoris: Secondary | ICD-10-CM

## 2012-03-23 NOTE — Assessment & Plan Note (Signed)
Unfortunately we do not have the strips. As such we will place an event monitor on hearing [name] she has another episode. If she truly has SVT, that she might be a candidate for RF ablation especially if his AV nodal. She will remain on beta blockers in the interim. I explained the whole nature of the problem the patient and her son. She will remain on medical therapy at this time. She is to call us if any further events occur

## 2012-03-23 NOTE — Patient Instructions (Addendum)
Your physician has recommended that you wear an event monitor. Event monitors are medical devices that record the heart's electrical activity. Doctors most often [place] these monitors to diagnose arrhythmias. Arrhythmias are problems with the speed or rhythm of the heartbeat. The monitor is a small, portable device. You can wear one while you do your normal daily activities. This is usually used to diagnose what is causing palpitations/syncope (passing out).  Your physician recommends that you schedule a follow-up appointment in: 5 WEEKS with Dr Riley Kill  Your physician recommends that you continue on your current medications as directed. Please refer to the Current Medication list given to you today.

## 2012-03-23 NOTE — Progress Notes (Signed)
   HPI:  The patient was in the emergency room yesterday.  Her strips were unavailable to me, but see note of ER  ATTENDING.  This came on over an hour or so, and she noted her rhythm was fast.  She converted and enzymes were negative.  For the past month she has been doing great.  No particular symptoms.    Current Outpatient Prescriptions  Medication Sig Dispense Refill  . amLODipine-benazepril (LOTREL) 5-20 MG per capsule Take 1 capsule by mouth daily.      Marland Kitchen aspirin 81 MG tablet Take 81 mg by mouth daily.      . metoprolol (LOPRESSOR) 50 MG tablet Take 1 tablet (50 mg total) by mouth 2 (two) times daily.  60 tablet  6  . dabigatran (PRADAXA) 150 MG CAPS Take 1 capsule (150 mg total) by mouth every 12 (twelve) hours.  60 capsule  6   No current facility-administered medications for this visit.    Allergies  Allergen Reactions  . Sulfonamide Derivatives     Unknown.    Past Medical History  Diagnosis Date  . 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 Hypertension   . Hypercholesteremia   . Obesity   . Impaired glucose tolerance   . Anxiety disorder   . Gastritis   . Osteoarthritis   . Heart murmur     from age 36 associated with some hemoptysis  . H/O: hysterectomy   . Atrial fibrillation     a. new 04/2011;  b. Pradaxa  . Chronic diastolic heart failure     Echocardiogram 04/14/11: Moderate LVH, EF 60-65%, mild LAE.  Marland Kitchen CVA (cerebral infarction)   . Stroke     Past Surgical History  Procedure Laterality Date  . Carpal tunnel release      right  . Tonsillectomy    . Eye surgery      left  . Replacement total knee      right    Family History  Problem Relation Age of Onset  . Cancer Mother 32    died  . Heart attack Father 14    died    History   Social History  . Marital Status: Widowed    Spouse Name: N/A    Number of Children: 2  . Years of Education: N/A    Occupational History  . retired    Social History Main Topics  . Smoking status: Never Smoker   . Smokeless tobacco: Not on file  . Alcohol Use: No  . Drug Use: No  . Sexually Active: Not on file   Other Topics Concern  . Not on file   Social History Narrative  . No narrative on file    ROS: Please see the HPI.  All other systems reviewed and negative.  PHYSICAL EXAM:  BP 114/69  Pulse 63  Ht 5\' 4"  (1.626 m)  Wt 169 lb (76.658 kg)  BMI 28.99 kg/m2  SpO2 97%  General: Well developed, well nourished, in no acute distress. Head:  Normocephalic and atraumatic. Neck: no JVD Lungs: Clear to auscultation and percussion. Heart: Normal S1 and S2.  No murmur, rubs or gallops.  Pulses: Pulses normal in all 4 extremities. Extremities: No clubbing or cyanosis. No edema. Neurologic: Alert and oriented x 3.  EKG:  NSR.  Normal ECG.    ASSESSMENT AND PLAN:

## 2012-03-23 NOTE — Assessment & Plan Note (Signed)
Sharp episode of chest discomfort yesterday most likely, it was related to the rate of 170. Prior to this, she was not having symptoms. Her EKG today is unremarkable. We will see her back in followup in about 3 weeks

## 2012-03-23 NOTE — Assessment & Plan Note (Signed)
Controlled at the present time.  

## 2012-03-23 NOTE — Telephone Encounter (Signed)
Event monitor placed on Pt 03/23/12 TK

## 2012-04-11 ENCOUNTER — Telehealth: Payer: Self-pay | Admitting: Physician Assistant

## 2012-04-11 NOTE — Telephone Encounter (Signed)
Note reviewed.  TS

## 2012-04-11 NOTE — Telephone Encounter (Signed)
I was able to reach patient by contacting her son under emergency contacts. Their main phone line has been knocked out by the power. Per Card[employer] monitoring company, she had episode of afib 160's earlier. The patient is completely asymptomatic. No CP, palpitations, SOB or any other symptoms. I had her check her pulse right now which was 66. She has h/o known afib and is taking Pradaxa. We will continue current meds and continue to watch on cardiac monitor. Instructed pt to notify our office if she develops any symptoms. She verbalized understanding and gratitude. Will forward to Dr. Riley Kill. Shenea Giacobbe PA-C

## 2012-04-11 NOTE — Telephone Encounter (Signed)
Received call from Cardio monitoring company that patient had triggered for afib rates 160's. They are attempting to call her as well. I tried to contact the patient but got voicemail. LMOM to please call us back ASAP. Will try again this AM. Ronie Spies PA-C

## 2012-04-16 ENCOUNTER — Ambulatory Visit (INDEPENDENT_AMBULATORY_CARE_PROVIDER_SITE_OTHER): Payer: Medicare Other | Admitting: Cardiology

## 2012-04-16 ENCOUNTER — Encounter: Payer: Self-pay | Admitting: Cardiology

## 2012-04-16 VITALS — BP 124/72 | HR 78 | Ht 64.0 in | Wt 170.0 lb

## 2012-04-16 DIAGNOSIS — I4891 Unspecified atrial fibrillation: Secondary | ICD-10-CM

## 2012-04-16 DIAGNOSIS — I10 Essential (primary) hypertension: Secondary | ICD-10-CM

## 2012-04-16 DIAGNOSIS — I2510 Atherosclerotic heart disease of native coronary artery without angina pectoris: Secondary | ICD-10-CM

## 2012-04-16 MED ORDER — DILTIAZEM HCL ER COATED BEADS 180 MG PO CP24: 180.0000 mg | ORAL_CAPSULE | Freq: Every day | ORAL | Status: DC

## 2012-04-16 NOTE — Progress Notes (Signed)
HPI:  She is in for follow up.  She had a rapid run of afib on her event monitor.  Now back in NSR.  She did not feel any of it.  Rate was up to 150.  No chest pain.     Current Outpatient Prescriptions  Medication Sig Dispense Refill  . amLODipine-benazepril (LOTREL) 5-20 MG per capsule Take 1 capsule by mouth daily.      Marland Kitchen aspirin 81 MG tablet Take 81 mg by mouth daily.      . dabigatran (PRADAXA) 150 MG CAPS Take 1 capsule (150 mg total) by mouth every 12 (twelve) hours.  60 capsule  6  . metoprolol (LOPRESSOR) 50 MG tablet Take 1 tablet (50 mg total) by mouth 2 (two) times daily.  60 tablet  6  . diltiazem (CARDIZEM SR) 120 MG 12 hr capsule        No current facility-administered medications for this visit.    Allergies  Allergen Reactions  . Sulfonamide Derivatives     Unknown.    Past Medical History  Diagnosis Date  . 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 Hypertension   . Hypercholesteremia   . Obesity   . Impaired glucose tolerance   . Anxiety disorder   . Gastritis   . Osteoarthritis   . Heart murmur     from age 82 associated with some hemoptysis  . H/O: hysterectomy   . Atrial fibrillation     a. new 04/2011;  b. Pradaxa  . Chronic diastolic heart failure     Echocardiogram 04/14/11: Moderate LVH, EF 60-65%, mild LAE.  Marland Kitchen CVA (cerebral infarction)   . Stroke     Past Surgical History  Procedure Laterality Date  . Carpal tunnel release      right  . Tonsillectomy    . Eye surgery      left  . Replacement total knee      right    Family History  Problem Relation Age of Onset  . Cancer Mother 32    died  . Heart attack Father 97    died    History   Social History  . Marital Status: Widowed    Spouse Name: N/A    Number of Children: 2  . Years of Education: N/A   Occupational History  . retired    Social History Main Topics  . Smoking status:  Never Smoker   . Smokeless tobacco: Not on file  . Alcohol Use: No  . Drug Use: No  . Sexually Active: Not on file   Other Topics Concern  . Not on file   Social History Narrative  . No narrative on file    ROS: Please see the HPI.  All other systems reviewed and negative.  PHYSICAL EXAM:  BP 124/72  Pulse 78  Ht 5\' 4"  (1.626 m)  Wt 170 lb (77.111 kg)  BMI 29.17 kg/m2  General: Well developed, well nourished, in no acute distress. Head:  Normocephalic and atraumatic. Neck: no JVD Lungs: Clear to auscultation and percussion. Heart: Normal S1 and S2.  Minimal SEM.  No DM.   Pulses: Pulses normal in all 4 extremities. Extremities: No clubbing or cyanosis. No edema. Neurologic: Alert and oriented x 3.  EKG:  NSR.  LAE.  No acute changes.    ASSESSMENT AND PLAN:  1.  Increase dilt to 180 mg  daily. 2.  RTC next week.   3.  May consider amiodarone.

## 2012-04-16 NOTE — Patient Instructions (Addendum)
Your physician has recommended you make the following change in your medication: INCREASE Diltiazem to 180mg  take one by mouth daily  Your physician recommends that you schedule a follow-up appointment in: 1 WEEK with Dr Riley Kill

## 2012-04-18 NOTE — Assessment & Plan Note (Signed)
Currently under control

## 2012-04-18 NOTE — Assessment & Plan Note (Signed)
No recurrent angina at the present time.  See anatomy.

## 2012-04-18 NOTE — Assessment & Plan Note (Signed)
See most recent episode.  Currently on anticoagulation.  Will increase her diltiazem and see her back in follow up soon.  No change otherwise.  May need to consider amiodarone.

## 2012-04-22 ENCOUNTER — Encounter: Payer: Self-pay | Admitting: Cardiology

## 2012-04-22 ENCOUNTER — Ambulatory Visit (INDEPENDENT_AMBULATORY_CARE_PROVIDER_SITE_OTHER): Payer: Medicare Other | Admitting: Cardiology

## 2012-04-22 VITALS — BP 122/74 | HR 63 | Ht 64.0 in | Wt 169.0 lb

## 2012-04-22 DIAGNOSIS — I2510 Atherosclerotic heart disease of native coronary artery without angina pectoris: Secondary | ICD-10-CM

## 2012-04-22 DIAGNOSIS — I4891 Unspecified atrial fibrillation: Secondary | ICD-10-CM

## 2012-04-22 NOTE — Patient Instructions (Addendum)
Your physician recommends that you schedule a follow-up appointment in: second week of April  Bring all of your medicine bottles with you to your next appt with Dr Riley Kill in April.

## 2012-04-22 NOTE — Progress Notes (Signed)
HPI:  Patient returns in followup. Since I saw her last week she has not had a recurrence of her atrial arrhythmia. Overall she's tolerated the increase in medication. She denies any ongoing chest pain she took a monitor off this week as well. She's not short of breath  Current Outpatient Prescriptions  Medication Sig Dispense Refill  . amLODipine-benazepril (LOTREL) 5-20 MG per capsule Take 1 capsule by mouth daily.      Marland Kitchen aspirin 81 MG tablet Take 81 mg by mouth daily.      . dabigatran (PRADAXA) 150 MG CAPS Take 1 capsule (150 mg total) by mouth every 12 (twelve) hours.  60 capsule  6  . diltiazem (CARDIZEM CD) 180 MG 24 hr capsule Take 1 capsule (180 mg total) by mouth daily.  30 capsule  3  . metoprolol (LOPRESSOR) 50 MG tablet Take 1 tablet (50 mg total) by mouth 2 (two) times daily.  60 tablet  6  . tetrahydrozoline 0.05 % ophthalmic solution Place 1 drop into the right eye 2 (two) times daily.       No current facility-administered medications for this visit.    Allergies  Allergen Reactions  . Sulfonamide Derivatives     Unknown.    Past Medical History  Diagnosis Date  . 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 Hypertension   . Hypercholesteremia   . Obesity   . Impaired glucose tolerance   . Anxiety disorder   . Gastritis   . Osteoarthritis   . Heart murmur     from age 32 associated with some hemoptysis  . H/O: hysterectomy   . Atrial fibrillation     a. new 04/2011;  b. Pradaxa  . Chronic diastolic heart failure     Echocardiogram 04/14/11: Moderate LVH, EF 60-65%, mild LAE.  Marland Kitchen CVA (cerebral infarction)   . Stroke     Past Surgical History  Procedure Laterality Date  . Carpal tunnel release      right  . Tonsillectomy    . Eye surgery      left  . Replacement total knee      right    Family History  Problem Relation Age of Onset  . Cancer Mother 85    died    . Heart attack Father 24    died    History   Social History  . Marital Status: Widowed    Spouse Name: N/A    Number of Children: 2  . Years of Education: N/A   Occupational History  . retired    Social History Main Topics  . Smoking status: Never Smoker   . Smokeless tobacco: Not on file  . Alcohol Use: No  . Drug Use: No  . Sexually Active: Not on file   Other Topics Concern  . Not on file   Social History Narrative  . No narrative on file    ROS: Please see the HPI.  All other systems reviewed and negative.  PHYSICAL EXAM:  BP 122/74  Pulse 63  Ht 5\' 4"  (1.626 m)  Wt 169 lb (76.658 kg)  BMI 28.99 kg/m2  SpO2 97%  General: Well developed, well nourished, in no acute distress. Head:  Normocephalic and atraumatic. Neck: no JVD Lungs: Clear to auscultation and percussion. Heart: Normal S1 and S2.  No murmur, rubs or gallops.  Pulses: Pulses normal in all 4 extremities.  Extremities: No clubbing or cyanosis. No edema. Neurologic: Alert and oriented x 3.  EKG:  Not done today  ASSESSMENT AND PLAN:  1. Recurrent atrial fibrillation--  She has not had clinical recurrence.  Her stroke risk is covered.  With regard to her rhythm, would like to avoid amiodarone if possible.  Will see her back again in about three weeks, and if ok continue as she is doing.   2.  HTN controlled 3.  Continue dabigatran.

## 2012-04-25 NOTE — Assessment & Plan Note (Signed)
No angina despite AF.  See my note.

## 2012-04-25 NOTE — Assessment & Plan Note (Signed)
She may require anti arrhythmic therapy.  She is anticoagulated at present.  Will see again in follow up in a few weeks and reassess. Monitor reivewed----she does get fast when she has the af.

## 2012-05-05 ENCOUNTER — Ambulatory Visit: Payer: Medicare Other | Admitting: Cardiology

## 2012-05-07 ENCOUNTER — Ambulatory Visit: Payer: Medicare Other | Admitting: Cardiology

## 2012-05-11 ENCOUNTER — Other Ambulatory Visit: Payer: Self-pay

## 2012-05-11 DIAGNOSIS — I4891 Unspecified atrial fibrillation: Secondary | ICD-10-CM

## 2012-05-11 DIAGNOSIS — I10 Essential (primary) hypertension: Secondary | ICD-10-CM

## 2012-05-11 DIAGNOSIS — Z01810 Encounter for preprocedural cardiovascular examination: Secondary | ICD-10-CM

## 2012-05-11 DIAGNOSIS — I2510 Atherosclerotic heart disease of native coronary artery without angina pectoris: Secondary | ICD-10-CM

## 2012-05-11 MED ORDER — METOPROLOL TARTRATE 50 MG PO TABS: 50.0000 mg | ORAL_TABLET | Freq: Two times a day (BID) | ORAL | Status: DC

## 2012-05-12 ENCOUNTER — Other Ambulatory Visit: Payer: Self-pay | Admitting: *Deleted

## 2012-05-12 DIAGNOSIS — I2510 Atherosclerotic heart disease of native coronary artery without angina pectoris: Secondary | ICD-10-CM

## 2012-05-12 DIAGNOSIS — I10 Essential (primary) hypertension: Secondary | ICD-10-CM

## 2012-05-12 DIAGNOSIS — I4891 Unspecified atrial fibrillation: Secondary | ICD-10-CM

## 2012-05-12 DIAGNOSIS — Z01810 Encounter for preprocedural cardiovascular examination: Secondary | ICD-10-CM

## 2012-05-12 MED ORDER — METOPROLOL TARTRATE 50 MG PO TABS: 50.0000 mg | ORAL_TABLET | Freq: Two times a day (BID) | ORAL | Status: DC

## 2012-05-19 ENCOUNTER — Encounter: Payer: Self-pay | Admitting: Cardiology

## 2012-05-19 ENCOUNTER — Ambulatory Visit (INDEPENDENT_AMBULATORY_CARE_PROVIDER_SITE_OTHER): Payer: Medicare Other | Admitting: Cardiology

## 2012-05-19 VITALS — BP 124/86 | HR 66 | Ht 64.0 in | Wt 171.0 lb

## 2012-05-19 DIAGNOSIS — I2510 Atherosclerotic heart disease of native coronary artery without angina pectoris: Secondary | ICD-10-CM

## 2012-05-19 DIAGNOSIS — I4891 Unspecified atrial fibrillation: Secondary | ICD-10-CM

## 2012-05-19 DIAGNOSIS — E7800 Pure hypercholesterolemia, unspecified: Secondary | ICD-10-CM

## 2012-05-19 DIAGNOSIS — I10 Essential (primary) hypertension: Secondary | ICD-10-CM

## 2012-05-19 NOTE — Patient Instructions (Signed)
You have been referred to Dr Johney Frame for Electrophysiology evaluation in regards to Atrial Fibrillation.   Your physician recommends that you schedule a follow-up appointment in: 2 MONTHS with Dr Antoine Poche (previous pt of Dr Riley Kill)  Your physician recommends that you continue on your current medications as directed. Please refer to the Current Medication list given to you today.

## 2012-05-19 NOTE — Progress Notes (Signed)
HPI:   This nice [name] is in for followup. Since I last saw her she has not had any episodes of increased heart rate but she is aware. Overall she has done well. She denies any chest pain.   Current Outpatient Prescriptions  Medication Sig Dispense Refill  . amLODipine-benazepril (LOTREL) 5-20 MG per capsule Take 1 capsule by mouth daily.      Marland Kitchen aspirin 81 MG tablet Take 81 mg by mouth daily.      . dabigatran (PRADAXA) 150 MG CAPS Take 1 capsule (150 mg total) by mouth every 12 (twelve) hours.  60 capsule  6  . diltiazem (CARDIZEM CD) 180 MG 24 hr capsule Take 1 capsule (180 mg total) by mouth daily.  30 capsule  3  . metoprolol (LOPRESSOR) 50 MG tablet Take 1 tablet (50 mg total) by mouth 2 (two) times daily.  60 tablet  6  . tetrahydrozoline 0.05 % ophthalmic solution Place 1 drop into [name] right eye 2 (two) times daily.       No current facility-administered medications for this visit.    Allergies  Allergen Reactions  . Sulfonamide Derivatives     Unknown.    Past Medical History  Diagnosis Date  . 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 Hypertension   . Hypercholesteremia   . Obesity   . Impaired glucose tolerance   . Anxiety disorder   . Gastritis   . Osteoarthritis   . Heart murmur     from age 78 associated with some hemoptysis  . H/O: hysterectomy   . Atrial fibrillation     a. new 04/2011;  b. Pradaxa  . Chronic diastolic heart failure     Echocardiogram 04/14/11: Moderate LVH, EF 60-65%, mild LAE.  Marland Kitchen CVA (cerebral infarction)   . Stroke     Past Surgical History  Procedure Laterality Date  . Carpal tunnel release      right  . Tonsillectomy    . Eye surgery      left  . Replacement total knee      right    Family History  Problem Relation Age of Onset  . Cancer Mother 49    died  . Heart attack Father 15    died    History   Social History  . Marital  Status: Widowed    Spouse Name: N/A    Number of Children: 2  . Years of Education: N/A   Occupational History  . retired    Social History Main Topics  . Smoking status: Never Smoker   . Smokeless tobacco: Not on file  . Alcohol Use: No  . Drug Use: No  . Sexually Active: Not on file   Other Topics Concern  . Not on file   Social History Narrative  . No narrative on file    ROS: Please see [name] HPI.  All other systems reviewed and negative.  PHYSICAL EXAM:  BP 124/86  Pulse 66  Ht 5\' 4"  (1.626 m)  Wt 171 lb (77.565 kg)  BMI 29.34 kg/m2  SpO2 97%  General: Well developed, well nourished, in no acute distress. Head:  Normocephalic and atraumatic. Neck: no JVD Lungs: Clear to auscultation and percussion. Heart: Normal S1 and S2.  No murmur, rubs or gallops.  Pulses: Pulses normal in all 4 extremities. Extremities: No clubbing or cyanosis. No edema. Neurologic: Alert and  oriented x 3.  EKG:  NSR.  Otherwise normal tracing.    ASSESSMENT AND PLAN:

## 2012-05-20 NOTE — Assessment & Plan Note (Signed)
Her current blood pressure appears to be well-controlled

## 2012-05-20 NOTE — Assessment & Plan Note (Signed)
This nice patient has had bouts of atrial fibrillation. Somewhat also appears to be flutter. She is well anticoagulated, but given the right which she goes on medications it would be valuable for her to see electrophysiology for further evaluation. We will try to make these arrangements, particularly with my departure so she has good electrophysiologic followup.

## 2012-05-20 NOTE — Assessment & Plan Note (Signed)
The patient will need a lipid liver profile. We will try to get this arranged make some decisions about long-term lipid management

## 2012-05-20 NOTE — Assessment & Plan Note (Signed)
The patient has not had recurrent angina pectoris at the present time

## 2012-06-17 ENCOUNTER — Ambulatory Visit: Payer: Medicare Other | Admitting: Internal Medicine

## 2012-07-01 ENCOUNTER — Telehealth: Payer: Self-pay | Admitting: Internal Medicine

## 2012-07-01 ENCOUNTER — Ambulatory Visit (INDEPENDENT_AMBULATORY_CARE_PROVIDER_SITE_OTHER): Payer: Medicare Other | Admitting: Internal Medicine

## 2012-07-01 ENCOUNTER — Encounter: Payer: Self-pay | Admitting: Internal Medicine

## 2012-07-01 VITALS — BP 146/74 | HR 84 | Ht 64.0 in | Wt 164.1 lb

## 2012-07-01 DIAGNOSIS — I4891 Unspecified atrial fibrillation: Secondary | ICD-10-CM

## 2012-07-01 LAB — CBC WITH DIFFERENTIAL/PLATELET
Basophils Absolute: 0 10*3/uL (ref 0.0–0.1)
Basophils Relative: 0.3 % (ref 0.0–3.0)
Eosinophils Absolute: 0.3 10*3/uL (ref 0.0–0.7)
Eosinophils Relative: 3.3 % (ref 0.0–5.0)
HCT: 44.9 % (ref 36.0–46.0)
Hemoglobin: 14.7 g/dL (ref 12.0–15.0)
Lymphocytes Relative: 23.9 % (ref 12.0–46.0)
Lymphs Abs: 2.4 10*3/uL (ref 0.7–4.0)
MCHC: 32.7 g/dL (ref 30.0–36.0)
MCV: 83.3 fl (ref 78.0–100.0)
Monocytes Absolute: 0.6 10*3/uL (ref 0.1–1.0)
Monocytes Relative: 6.1 % (ref 3.0–12.0)
Neutro Abs: 6.6 10*3/uL (ref 1.4–7.7)
Neutrophils Relative %: 66.4 % (ref 43.0–77.0)
Platelets: 293 10*3/uL (ref 150.0–400.0)
RBC: 5.39 Mil/uL — ABNORMAL HIGH (ref 3.87–5.11)
RDW: 14.3 % (ref 11.5–14.6)
WBC: 9.9 10*3/uL (ref 4.5–10.5)

## 2012-07-01 LAB — BASIC METABOLIC PANEL
BUN: 17 mg/dL (ref 6–23)
CO2: 31 mEq/L (ref 19–32)
Calcium: 9.8 mg/dL (ref 8.4–10.5)
Chloride: 104 mEq/L (ref 96–112)
Creatinine, Ser: 1.1 mg/dL (ref 0.4–1.2)
GFR: 64.4 mL/min (ref >60.00)
Glucose, Bld: 94 mg/dL (ref 70–99)
Potassium: 4 mEq/L (ref 3.5–5.1)
Sodium: 140 mEq/L (ref 135–145)

## 2012-07-01 NOTE — Telephone Encounter (Signed)
Release completed and faxed to Dr. Margarita Rana office [phone] ) to obtain the patient's records. Requesting for records for Dr. Lina Sar to review per PCC-patient to establish.

## 2012-07-01 NOTE — Patient Instructions (Signed)
Your physician recommends that you schedule a follow-up appointment in: 2 months with Sunday Spillers and 6 months with Dr Johney Frame   Your physician has recommended you make the following change in your medication:  1)Stop Aspirin  Your physician recommends that you return for lab work today: BMP/CBC   You have been referred to Dr Lina Sar

## 2012-07-01 NOTE — Progress Notes (Signed)
Primary Care Physician: Alva Garnet., MD Referring Physician:  Dr Lesly Dukes is a 72 y.o. female with a h/o paroxysmal atrial fibrillation who presents for EP consultation.  She reports initially being diagnosed with atrial fibrillation around 2007 after presenting with a stroke.  She was treated with coumadin.  She has been largely asymptomatic with her afib.  She cannot tell how often she has afib.  She was switched from coumadin to pradaxa for about a year.  She has stopped pradaxa recently due to recent blood in her stool.    Today, she denies symptoms of palpitations, chest pain, shortness of breath, orthopnea, PND, lower extremity edema, dizziness, presyncope, syncope, or neurologic sequela. The patient is tolerating medications without difficulties and is otherwise without complaint today.   Past Medical History  Diagnosis Date  . 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 Hypertension   . Hypercholesteremia   . Obesity   . Impaired glucose tolerance   . Anxiety disorder   . Gastritis   . Osteoarthritis   . Heart murmur     from age 58 associated with some hemoptysis  . H/O: hysterectomy   . Atrial fibrillation     a. new 04/2011;  b. Pradaxa  . Chronic diastolic heart failure     Echocardiogram 04/14/11: Moderate LVH, EF 60-65%, mild LAE.  Marland Kitchen Stroke 2007    Residual R sided weakness, uses a wheelchair, walker at home   Past Surgical History  Procedure Laterality Date  . Carpal tunnel release      right  . Tonsillectomy    . 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 Replacement total knee      right    Current Outpatient Prescriptions  Medication Sig Dispense Refill  . amLODipine-benazepril (LOTREL) 5-20 MG per capsule Take 1 capsule by mouth daily.      Marland Kitchen aspirin 81 MG tablet Take 81 mg by mouth daily.      Marland Kitchen diltiazem (CARDIZEM CD) 180 MG  24 hr capsule Take 1 capsule (180 mg total) by mouth daily.  30 capsule  3  . metoprolol (LOPRESSOR) 50 MG tablet Take 1 tablet (50 mg total) by mouth 2 (two) times daily.  60 tablet  6  . tetrahydrozoline 0.05 % ophthalmic solution Place 1 drop into the right eye 2 (two) times daily.      . dabigatran (PRADAXA) 150 MG CAPS Take 1 capsule (150 mg total) by mouth every 12 (twelve) hours.  60 capsule  6   No current facility-administered medications for this visit.    Allergies  Allergen Reactions  . Sulfonamide Derivatives     Unknown.    History   Social History  . Marital Status: Widowed    Spouse Name: N/A    Number of Children: 2  . Years of Education: N/A   Occupational History  . retired    Social History Main Topics  . Smoking status: Never Smoker   . Smokeless tobacco: Not on file  . Alcohol Use: No  . Drug Use: No  . Sexually Active: Not on file   Other Topics Concern  . Not on file   Social History Narrative   Lives in [place] with son.  Widow.  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   Member of [employer]  [employer]    Family History  Problem Relation Age of Onset  . Cancer Mother 69    died  . Heart attack Father 49    died    ROS- All systems are reviewed and negative except as per the HPI above  Physical Exam: Filed Vitals:   07/01/12 1210  BP: 146/74  Pulse: 84  Height: 5\' 4"  (1.626 m)  Weight: 164 lb 1.9 oz (74.444 kg)    GEN- The patient is elderly appearing, alert and oriented x 3 today.   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 Asymptomatic paroxysmal atrial fibrillation Rate control long term The importance of long term anticoagulation was stressed today (CHADS2VASC score is at least 5) She reports GI bleeding with pradaxa + ASA Stop ASA Resume pradaxa Check BMET and CBC today Refer to Dr Juanda Chance for GI assessment (she has never had colonoscopy)  followup with Lawson Fiscal in 2 months I will see again in 6 months

## 2012-07-27 ENCOUNTER — Encounter: Payer: Self-pay | Admitting: Cardiology

## 2012-07-27 ENCOUNTER — Ambulatory Visit: Payer: Medicare Other | Admitting: Cardiology

## 2012-07-27 ENCOUNTER — Ambulatory Visit (INDEPENDENT_AMBULATORY_CARE_PROVIDER_SITE_OTHER): Payer: Medicare Other | Admitting: Cardiology

## 2012-07-27 VITALS — BP 145/80 | HR 60 | Ht 64.0 in | Wt 164.0 lb

## 2012-07-27 DIAGNOSIS — I4891 Unspecified atrial fibrillation: Secondary | ICD-10-CM

## 2012-07-27 DIAGNOSIS — I10 Essential (primary) hypertension: Secondary | ICD-10-CM

## 2012-07-27 NOTE — Patient Instructions (Addendum)
The current medical regimen is effective;  continue present plan and medications.  Follow up in 1 year with Dr Hochrein.  You will receive a letter in the mail 2 months before you are due.  Please call us when you receive this letter to schedule your follow up appointment.  
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
## 2012-08-05 ENCOUNTER — Other Ambulatory Visit: Payer: Self-pay | Admitting: Cardiology

## 2012-08-31 ENCOUNTER — Encounter: Payer: Self-pay | Admitting: Internal Medicine

## 2012-08-31 ENCOUNTER — Ambulatory Visit (INDEPENDENT_AMBULATORY_CARE_PROVIDER_SITE_OTHER): Payer: Medicare Other | Admitting: Nurse Practitioner

## 2012-08-31 VITALS — BP 110/70 | HR 78 | Resp 20

## 2012-08-31 DIAGNOSIS — I4891 Unspecified atrial fibrillation: Secondary | ICD-10-CM

## 2012-08-31 LAB — CBC WITH DIFFERENTIAL/PLATELET
Basophils Absolute: 0 10*3/uL (ref 0.0–0.1)
Basophils Relative: 0.3 % (ref 0.0–3.0)
Eosinophils Absolute: 0.1 10*3/uL (ref 0.0–0.7)
Eosinophils Relative: 1.5 % (ref 0.0–5.0)
HCT: 42.6 % (ref 36.0–46.0)
Hemoglobin: 13.8 g/dL (ref 12.0–15.0)
Lymphocytes Relative: 19.8 % (ref 12.0–46.0)
Lymphs Abs: 1.8 10*3/uL (ref 0.7–4.0)
MCHC: 32.4 g/dL (ref 30.0–36.0)
MCV: 83.8 fl (ref 78.0–100.0)
Monocytes Absolute: 0.6 10*3/uL (ref 0.1–1.0)
Monocytes Relative: 6.7 % (ref 3.0–12.0)
Neutro Abs: 6.7 10*3/uL (ref 1.4–7.7)
Neutrophils Relative %: 71.7 % (ref 43.0–77.0)
Platelets: 277 10*3/uL (ref 150.0–400.0)
RBC: 5.08 Mil/uL (ref 3.87–5.11)
RDW: 14.9 % — ABNORMAL HIGH (ref 11.5–14.6)
WBC: 9.3 10*3/uL (ref 4.5–10.5)

## 2012-08-31 LAB — BASIC METABOLIC PANEL
BUN: 17 mg/dL (ref 6–23)
CO2: 29 mEq/L (ref 19–32)
Calcium: 9.7 mg/dL (ref 8.4–10.5)
Chloride: 107 mEq/L (ref 96–112)
Creatinine, Ser: 1.1 mg/dL (ref 0.4–1.2)
GFR: 61.7 mL/min (ref >60.00)
Glucose, Bld: 114 mg/dL — ABNORMAL HIGH (ref 70–99)
Potassium: 4.7 mEq/L (ref 3.5–5.1)
Sodium: 142 mEq/L (ref 135–145)

## 2012-08-31 MED ORDER — DABIGATRAN ETEXILATE MESYLATE 150 MG PO CAPS: 150.0000 mg | ORAL_CAPSULE | Freq: Two times a day (BID) | ORAL | Status: DC

## 2012-08-31 NOTE — Progress Notes (Addendum)
[name] Date of Birth: 01-Sep-1940 Medical Record #[id number]  History of Present Illness: [name] is seen back today for a one month check. Seen for Dr. Antoine Poche. Has atrial fib, CAD with past cath in 2013 showing anomalous coronary anatomy with mild diffuse plaquing and NO critical disease, HTN, HLD, obesity, anxiety, gastritis, OA, murmur, stroke and diastolic heart failure.   Seen last month with recurrent atrial fib. Back on blood thinner. Has had past issues with bleeding in the past and was awaiting GI follow up. Was to have followed back up here in a year.   Comes in today. Here with her son who did not elect to stay in the exam room. She does not know why she is here. She is not really able to give me any history. Says he is feeling fine. Apparently her son takes care of her medicines. She is pretty immobile. No chest pain. Not short of breath. Chronic swelling of her feet noted. She does not have her Pradaxa with her - says the drug store went out of business and she did not know another one to go to. Says she has had no bleeding. No GI appointments noted.   Current Outpatient Prescriptions  Medication Sig Dispense Refill  . amLODipine-benazepril (LOTREL) 5-20 MG per capsule Take 1 capsule by mouth daily.      Marland Kitchen diltiazem (CARDIZEM CD) 180 MG 24 hr capsule TAKE 1 CAPSULE (180 MG TOTAL) BY MOUTH DAILY.  30 capsule  3  . metoprolol (LOPRESSOR) 50 MG tablet Take 1 tablet (50 mg total) by mouth 2 (two) times daily.  60 tablet  6  . dabigatran (PRADAXA) 150 MG CAPS Take 1 capsule (150 mg total) by mouth every 12 (twelve) hours.  60 capsule  6  . tetrahydrozoline 0.05 % ophthalmic solution Place 1 drop into the right eye 2 (two) times daily.       No current facility-administered medications for this visit.    Allergies  Allergen Reactions  . Sulfonamide Derivatives     Unknown.    Past Medical History  Diagnosis Date  . Coronary artery disease     LHC 3/13:  Anomalous  coronary anatomy with mild diffuse coronary plaquing without critical disease  . Hypertension   . Hypercholesteremia   . Obesity   . Impaired glucose tolerance   . Anxiety disorder   . Gastritis   . Osteoarthritis   . Heart murmur     from age 66 associated with some hemoptysis  . H/O: hysterectomy   . Atrial fibrillation     a. new 04/2011;  b. Pradaxa  . Chronic diastolic heart failure     Echocardiogram 04/14/11: Moderate LVH, EF 60-65%, mild LAE.  Marland Kitchen Stroke 2007    Residual R sided weakness, uses a wheelchair, walker at home    Past Surgical History  Procedure Laterality Date  . Carpal tunnel release      right  . Tonsillectomy    . Eye surgery      left, blind in her L eye since age 28  . Replacement total knee      right    History  Smoking status  . Never Smoker   Smokeless tobacco  . Not on file    History  Alcohol Use No    Family History  Problem Relation Age of Onset  . Cancer Mother 14    died  . Heart attack Father 57    died  Review of Systems: The review of systems is per the HPI.  All other systems were reviewed and are negative.  Physical Exam: BP 110/70  Pulse 78  Resp 20 She is not able to weigh.  Patient is very pleasant and in no acute distress. Very poor historian. Skin is warm and dry. Color is normal.  HEENT is unremarkable. Normocephalic/atraumatic. PERRL. Sclera are nonicteric. Neck is supple. No masses. No JVD. Lungs are clear. Cardiac exam shows an irregular rhythm. Rate ok. Abdomen is soft. Extremities are with pedal edema. Gait is not tested.  No gross neurologic deficits noted.  LABORATORY DATA: PENDING  Lab Results  Component Value Date   WBC 9.9 07/01/2012   HGB 14.7 07/01/2012   HCT 44.9 07/01/2012   PLT 293.0 07/01/2012   GLUCOSE 94 07/01/2012   CHOL 220* 03/18/2006   TRIG 313* 03/18/2006   HDL 41.7 03/18/2006   LDLDIRECT 90.9 03/18/2006   ALT 9 04/13/2011   AST 19 04/13/2011   NA 140 07/01/2012   K 4.0 07/01/2012   CL 104  07/01/2012   CREATININE 1.1 07/01/2012   BUN 17 07/01/2012   CO2 31 07/01/2012   INR 1.2* 04/26/2011     Assessment / Plan: 1. Atrial fib - managed with rate control and anticoagulation. She is not on her Pradaxa. I have given the son a written prescription. I am checking baseline labs today and will get her back to see Kennon Rounds for discussion in one month. Looks like she was to hear from GI about an appointment. Will alert the schedulers here to follow up.   2. HTN - BP is ok.   3. 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   Will see her back next June as planned per Dr. Antoine Poche.   Patient is agreeable to this plan and will call if any problems develop in the interim.   Rosalio Macadamia, RN, ANP-C [facility] [address] [place], [place]  [address]   appt with Dr. Juanda Chance 10/27/12 @ 9:15   ----- Message -----   From: Rosalio Macadamia, NP   Sent: 08/31/2012 10:37 AM   To: Merita Norton Lloyd-Fate      Can we find out if Dr. Juanda Chance is going to see this patient per Dr. Jenel Lucks note from May???      Lawson Fiscal        ADDENDUM: A call was made to [name] yesterday concerning a referral made by Dr. Johney Frame to [facility] Dr. Lina Sar. Dr. Juanda Chance needs records from Dr. Charna Elizabeth office before she can see the patient. [name] refused to come into the office to sign a release of medical information. Patient states she's been going to multiple doctors and is tired. She would like to hold off at this time . She will call back when she changes her mind. Patient also needs an appointment with Kennon Rounds to follow-up on pradaxa. Patient Wants to wait awhile. States she has to depend on her son to bring her. I left a message for patient son to return my call.      Sharonda Lloyd-Fate at 09/01/2012 10:27 AM    Status: Signed             A call was made to [name] yesterday concerning a referral to [facility] by Dr. Johney Frame possible colonoscopy and

## 2012-08-31 NOTE — Patient Instructions (Addendum)
I am giving you a prescription to get back on your Pradaxa  We need to check lab today   See the pharmacist in a month to follow up the Pradaxa  See Dr. Antoine Poche as planned next June  Call the [facility] office at [phone] if you have any questions, problems or concerns.

## 2012-09-01 ENCOUNTER — Telehealth: Payer: Self-pay | Admitting: *Deleted

## 2012-09-01 NOTE — Telephone Encounter (Signed)
  A call was made to Ms. Mantz yesterday concerning a referral to [facility] by Dr. Johney Frame possible colonoscopy and

## 2012-09-01 NOTE — Telephone Encounter (Signed)
A call was made to [name] yesterday concerning a referral made by Dr. Johney Frame to [facility] Dr. Lina Sar.  Dr. Juanda Chance needs records from Dr. Charna Elizabeth office before she can see the patient. [name] refused to come into the office to sign a release of medical information. Patient states she's been going to multiple doctors and is tired. She would like to hold off at this time . She will call back when she changes her mind. Patient also needs an appointment with Kennon Rounds to follow-up on pradaxa. Patient  Wants to wait awhile. States she has to depend on her son to bring her. I left a message for patient son to return my call.

## 2012-10-27 ENCOUNTER — Ambulatory Visit: Payer: Medicare Other | Admitting: Internal Medicine

## 2012-11-26 ENCOUNTER — Other Ambulatory Visit: Payer: Self-pay

## 2012-11-26 ENCOUNTER — Other Ambulatory Visit: Payer: Self-pay | Admitting: *Deleted

## 2012-11-26 DIAGNOSIS — I4891 Unspecified atrial fibrillation: Secondary | ICD-10-CM

## 2012-11-26 DIAGNOSIS — I2510 Atherosclerotic heart disease of native coronary artery without angina pectoris: Secondary | ICD-10-CM

## 2012-11-26 DIAGNOSIS — I10 Essential (primary) hypertension: Secondary | ICD-10-CM

## 2012-11-26 DIAGNOSIS — Z01810 Encounter for preprocedural cardiovascular examination: Secondary | ICD-10-CM

## 2012-11-26 MED ORDER — METOPROLOL TARTRATE 50 MG PO TABS: 50.0000 mg | ORAL_TABLET | Freq: Two times a day (BID) | ORAL | Status: DC

## 2012-11-26 MED ORDER — DABIGATRAN ETEXILATE MESYLATE 150 MG PO CAPS: 150.0000 mg | ORAL_CAPSULE | Freq: Two times a day (BID) | ORAL | Status: DC

## 2013-01-04 ENCOUNTER — Encounter: Payer: Self-pay | Admitting: Podiatry

## 2013-01-04 ENCOUNTER — Ambulatory Visit (INDEPENDENT_AMBULATORY_CARE_PROVIDER_SITE_OTHER): Payer: Medicare Other | Admitting: Podiatry

## 2013-01-04 VITALS — BP 154/92 | HR 60 | Resp 16

## 2013-01-04 DIAGNOSIS — B351 Tinea unguium: Secondary | ICD-10-CM

## 2013-01-04 DIAGNOSIS — M79609 Pain in unspecified limb: Secondary | ICD-10-CM

## 2013-01-04 NOTE — Progress Notes (Signed)
Subjective:     Patient ID: [name], female   DOB: [birth year], 72 y.o.   MRN: [id number]  HPI patient has nail disease with thickness 1-5 of both feet they are painful when pressed  Review of Systems     Objective:   Physical Exam    neurovascular status unchanged with thick nailbeds and pain 1-5 both feet Assessment:     Mycotic nail infection with pain 1-5 both feet    Plan:     Debridement painful nailbeds 1-5 both feet

## 2013-02-03 ENCOUNTER — Encounter: Payer: Self-pay | Admitting: Internal Medicine

## 2013-03-03 ENCOUNTER — Ambulatory Visit: Payer: Medicare Other | Admitting: Internal Medicine

## 2013-03-10 ENCOUNTER — Ambulatory Visit: Payer: Medicare Other | Admitting: Internal Medicine

## 2013-04-05 ENCOUNTER — Encounter: Payer: Self-pay | Admitting: Podiatry

## 2013-04-05 ENCOUNTER — Ambulatory Visit (INDEPENDENT_AMBULATORY_CARE_PROVIDER_SITE_OTHER): Payer: Medicare Other | Admitting: Podiatry

## 2013-04-05 VITALS — BP 142/78 | HR 64 | Resp 12

## 2013-04-05 DIAGNOSIS — B351 Tinea unguium: Secondary | ICD-10-CM

## 2013-04-05 DIAGNOSIS — M79609 Pain in unspecified limb: Secondary | ICD-10-CM

## 2013-04-05 NOTE — Progress Notes (Signed)
Subjective:     Patient ID: [name], female   DOB: 07/30/1940, 73 y.o.   MRN: [id number]  HPI patient presents with nail disease pain in thickness 1-5 both feet that she cannot take care of herself   Review of Systems     Objective:   Physical Exam Neurovascular status was found to be intact with patient well oriented x3 and thick painful nail bed 1-5 both feet    Assessment:     Mycotic nail infection with pain 1-5 both feet    Plan:     Debridement of painful nail bed 1-5 both feet with no iatrogenic bleeding noted

## 2013-04-12 ENCOUNTER — Ambulatory Visit (INDEPENDENT_AMBULATORY_CARE_PROVIDER_SITE_OTHER): Payer: Medicare Other | Admitting: Internal Medicine

## 2013-04-12 ENCOUNTER — Encounter: Payer: Self-pay | Admitting: Internal Medicine

## 2013-04-12 VITALS — BP 112/68 | HR 62 | Ht 64.0 in

## 2013-04-12 DIAGNOSIS — I4891 Unspecified atrial fibrillation: Secondary | ICD-10-CM

## 2013-04-12 DIAGNOSIS — I10 Essential (primary) hypertension: Secondary | ICD-10-CM

## 2013-04-12 NOTE — Patient Instructions (Signed)
Your physician recommends that you schedule a follow-up appointment in: 4 months with Lori Gerhardt, NP and 12 months with Dr Allred  
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
## 2013-07-05 ENCOUNTER — Ambulatory Visit: Payer: Medicare Other | Admitting: Podiatry

## 2013-07-14 ENCOUNTER — Ambulatory Visit: Payer: Medicare Other | Admitting: Podiatry

## 2013-07-19 ENCOUNTER — Ambulatory Visit (INDEPENDENT_AMBULATORY_CARE_PROVIDER_SITE_OTHER): Payer: Medicare Other | Admitting: Podiatry

## 2013-07-19 ENCOUNTER — Encounter: Payer: Self-pay | Admitting: Podiatry

## 2013-07-19 VITALS — BP 104/72 | HR 79 | Resp 12

## 2013-07-19 DIAGNOSIS — B351 Tinea unguium: Secondary | ICD-10-CM

## 2013-07-19 DIAGNOSIS — M79609 Pain in unspecified limb: Secondary | ICD-10-CM

## 2013-07-19 NOTE — Progress Notes (Signed)
Subjective:     Patient ID: [name], female   DOB: 12/11/1940, 73 y.o.   MRN: [id number]  HPI patient presents with thick yellow toenails both feet that she cannot cut   Review of Systems     Objective:   Physical Exam Neurovascular status intact with thick brittle nailbeds 1-5 both feet that are painful    Assessment:     I can't nail infection with pain 1-5 both feet    Plan:     Debris painful nailbeds with no iatrogenic bleeding noted

## 2013-08-03 IMAGING — CR DG CHEST 1V PORT
1 series · 1 of 1 positions shown · non-contrast
Comparison: Chest x-ray 02/27/2005.

CLINICAL DATA: Chest pain and shortness of breath.

PORTABLE CHEST - 1 VIEW

[AP]
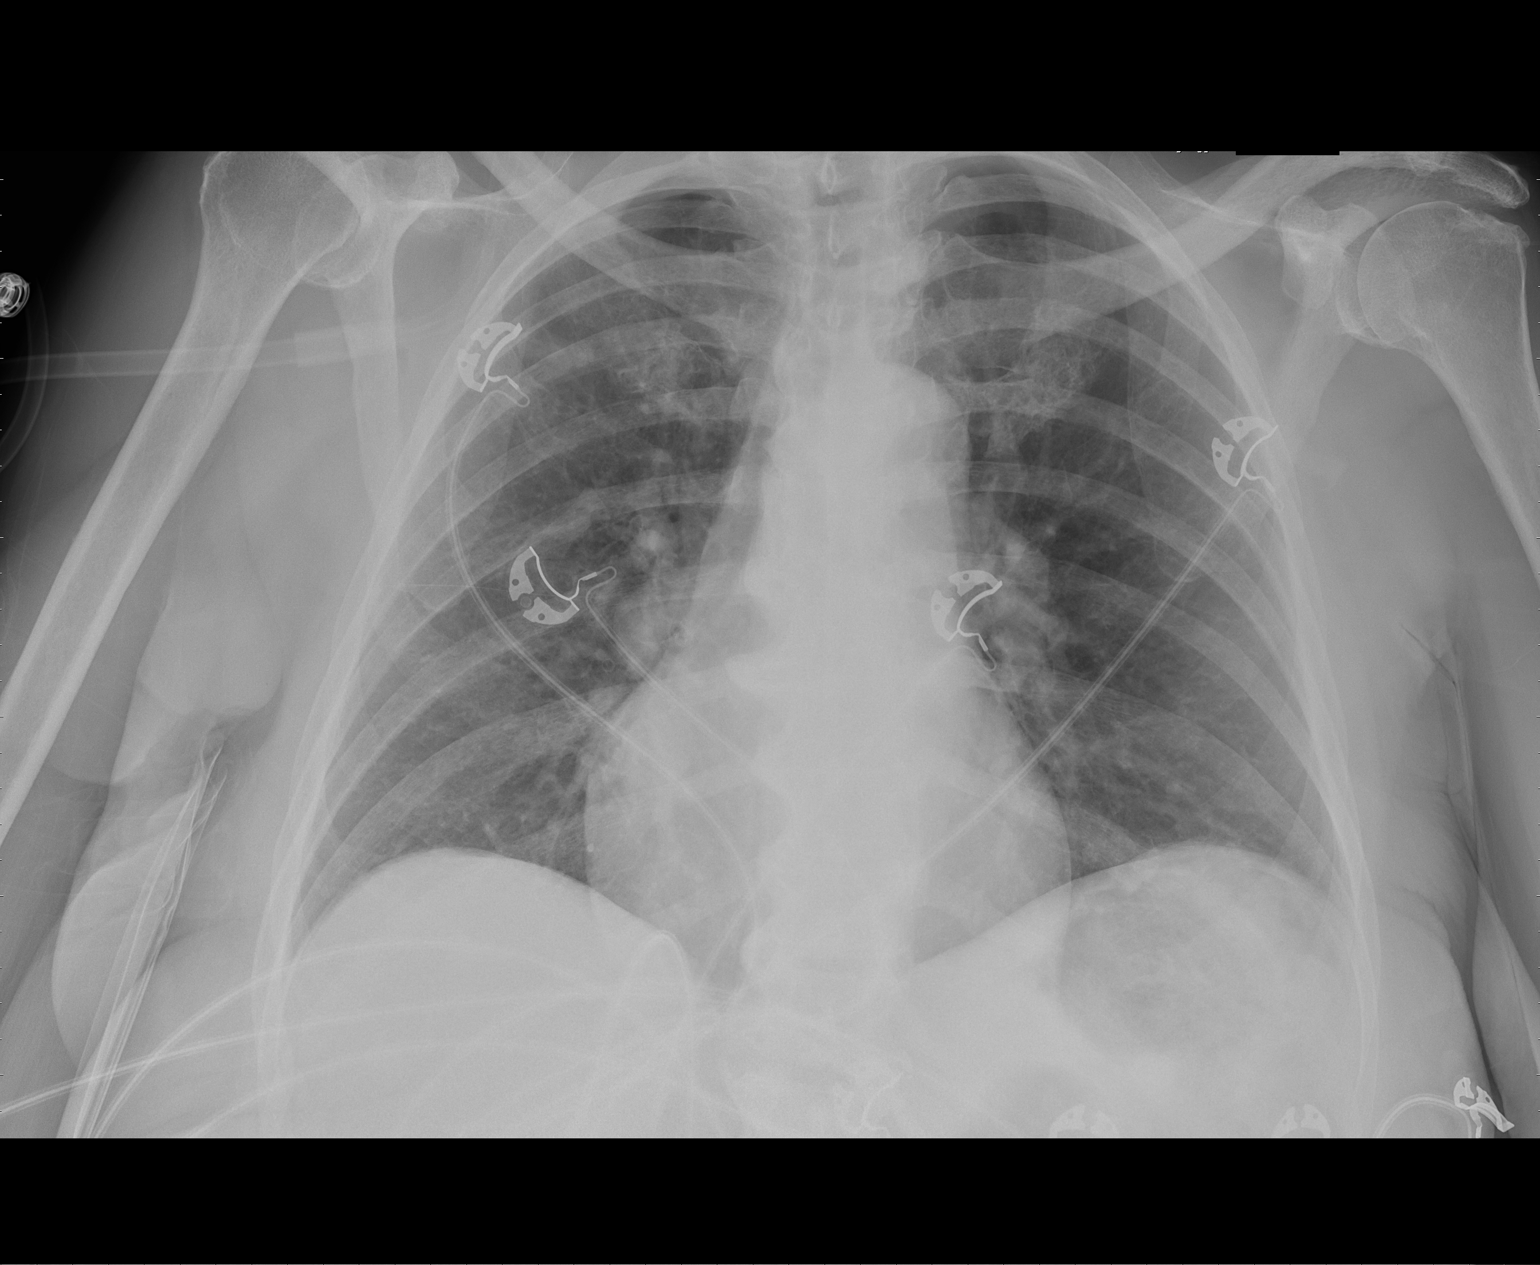

[1 of 1 positions shown; findings below may reference images not displayed]

FINDINGS: Lung volumes are normal.  No consolidative airspace
disease.  No pleural effusions.  Pulmonary venous congestion
without frank pulmonary edema.  Heart size is within normal limits.
The patient is rotated to the left on today's exam, resulting in
distortion of the mediastinal contours and reduced diagnostic
sensitivity and specificity for mediastinal pathology.
IMPRESSION: 1.  Mild pulmonary venous congestion without frank pulmonary edema.

## 2013-09-02 ENCOUNTER — Encounter (HOSPITAL_COMMUNITY): Payer: Self-pay | Admitting: Emergency Medicine

## 2013-09-02 ENCOUNTER — Emergency Department (HOSPITAL_COMMUNITY)
Admission: EM | Admit: 2013-09-02 | Discharge: 2013-09-02 | Disposition: A | Discharge status: Home / Self Care | Payer: Medicare Other | Attending: Emergency Medicine | Admitting: Emergency Medicine

## 2013-09-02 ENCOUNTER — Emergency Department (HOSPITAL_COMMUNITY): Payer: Medicare Other

## 2013-09-02 DIAGNOSIS — K5900 Constipation, unspecified: Secondary | ICD-10-CM | POA: Diagnosis not present

## 2013-09-02 DIAGNOSIS — Z8639 Personal history of other endocrine, nutritional and metabolic disease: Secondary | ICD-10-CM | POA: Insufficient documentation

## 2013-09-02 DIAGNOSIS — I2510 Atherosclerotic heart disease of native coronary artery without angina pectoris: Secondary | ICD-10-CM | POA: Diagnosis not present

## 2013-09-02 DIAGNOSIS — R011 Cardiac murmur, unspecified: Secondary | ICD-10-CM | POA: Diagnosis not present

## 2013-09-02 DIAGNOSIS — Z8659 Personal history of other mental and behavioral disorders: Secondary | ICD-10-CM | POA: Diagnosis not present

## 2013-09-02 DIAGNOSIS — E669 Obesity, unspecified: Secondary | ICD-10-CM | POA: Diagnosis not present

## 2013-09-02 DIAGNOSIS — Z79899 Other long term (current) drug therapy: Secondary | ICD-10-CM | POA: Insufficient documentation

## 2013-09-02 DIAGNOSIS — R109 Unspecified abdominal pain: Secondary | ICD-10-CM | POA: Insufficient documentation

## 2013-09-02 DIAGNOSIS — Z8719 Personal history of other diseases of the digestive system: Secondary | ICD-10-CM | POA: Insufficient documentation

## 2013-09-02 DIAGNOSIS — I714 Abdominal aortic aneurysm, without rupture, unspecified: Secondary | ICD-10-CM | POA: Insufficient documentation

## 2013-09-02 DIAGNOSIS — I10 Essential (primary) hypertension: Secondary | ICD-10-CM | POA: Insufficient documentation

## 2013-09-02 DIAGNOSIS — Z8669 Personal history of other diseases of the nervous system and sense organs: Secondary | ICD-10-CM | POA: Insufficient documentation

## 2013-09-02 DIAGNOSIS — N289 Disorder of kidney and ureter, unspecified: Secondary | ICD-10-CM

## 2013-09-02 DIAGNOSIS — Z8739 Personal history of other diseases of the musculoskeletal system and connective tissue: Secondary | ICD-10-CM | POA: Diagnosis not present

## 2013-09-02 DIAGNOSIS — Z8673 Personal history of transient ischemic attack (TIA), and cerebral infarction without residual deficits: Secondary | ICD-10-CM | POA: Diagnosis not present

## 2013-09-02 DIAGNOSIS — I5032 Chronic diastolic (congestive) heart failure: Secondary | ICD-10-CM | POA: Diagnosis not present

## 2013-09-02 DIAGNOSIS — Z90710 Acquired absence of both cervix and uterus: Secondary | ICD-10-CM | POA: Diagnosis not present

## 2013-09-02 DIAGNOSIS — Z862 Personal history of diseases of the blood and blood-forming organs and certain disorders involving the immune mechanism: Secondary | ICD-10-CM | POA: Insufficient documentation

## 2013-09-02 LAB — CBC WITH DIFFERENTIAL/PLATELET
Basophils Absolute: 0 10*3/uL (ref 0.0–0.1)
Basophils Relative: 0 % (ref 0–1)
Eosinophils Absolute: 0.3 10*3/uL (ref 0.0–0.7)
Eosinophils Relative: 4 % (ref 0–5)
HCT: 40.4 % (ref 36.0–46.0)
Hemoglobin: 12.9 g/dL (ref 12.0–15.0)
Lymphocytes Relative: 21 % (ref 12–46)
Lymphs Abs: 1.9 10*3/uL (ref 0.7–4.0)
MCH: 27 pg (ref 26.0–34.0)
MCHC: 31.9 g/dL (ref 30.0–36.0)
MCV: 84.7 fL (ref 78.0–100.0)
Monocytes Absolute: 0.5 10*3/uL (ref 0.1–1.0)
Monocytes Relative: 6 % (ref 3–12)
Neutro Abs: 6.4 10*3/uL (ref 1.7–7.7)
Neutrophils Relative %: 69 % (ref 43–77)
Platelets: 233 10*3/uL (ref 150–400)
RBC: 4.77 MIL/uL (ref 3.87–5.11)
RDW: 14.3 % (ref 11.5–15.5)
WBC: 9.1 10*3/uL (ref 4.0–10.5)

## 2013-09-02 LAB — COMPREHENSIVE METABOLIC PANEL
ALT: 18 U/L (ref 0–35)
AST: 29 U/L (ref 0–37)
Albumin: 3.8 g/dL (ref 3.5–5.2)
Alkaline Phosphatase: 78 U/L (ref 39–117)
Anion gap: 15 (ref 5–15)
BUN: 23 mg/dL (ref 6–23)
CO2: 22 mEq/L (ref 19–32)
Calcium: 9.3 mg/dL (ref 8.4–10.5)
Chloride: 110 mEq/L (ref 96–112)
Creatinine, Ser: 1 mg/dL (ref 0.50–1.10)
GFR calc Af Amer: 62 mL/min — ABNORMAL LOW (ref >90)
GFR calc non Af Amer: 54 mL/min — ABNORMAL LOW (ref >90)
Glucose, Bld: 88 mg/dL (ref 70–99)
Potassium: 3.9 mEq/L (ref 3.7–5.3)
Sodium: 147 mEq/L (ref 137–147)
Total Bilirubin: 0.8 mg/dL (ref 0.3–1.2)
Total Protein: 7 g/dL (ref 6.0–8.3)

## 2013-09-02 LAB — URINALYSIS, ROUTINE W REFLEX MICROSCOPIC
Glucose, UA: NEGATIVE mg/dL
Hgb urine dipstick: NEGATIVE
Ketones, ur: 15 mg/dL — AB
Leukocytes, UA: NEGATIVE
Nitrite: NEGATIVE
Protein, ur: NEGATIVE mg/dL
Specific Gravity, Urine: 1.027 (ref 1.005–1.030)
Urobilinogen, UA: 1 mg/dL (ref 0.0–1.0)
pH: 5 (ref 5.0–8.0)

## 2013-09-02 LAB — LIPASE, BLOOD: Lipase: 22 U/L (ref 11–59)

## 2013-09-02 LAB — I-STAT CG4 LACTIC ACID, ED: Lactic Acid, Venous: 1.15 mmol/L (ref 0.5–2.2)

## 2013-09-02 MED ORDER — POLYETHYLENE GLYCOL 3350 17 G PO PACK: 17.0000 g | PACK | Freq: Every day | ORAL | Status: DC

## 2013-09-02 MED ORDER — IOHEXOL 300 MG/ML  SOLN
25.0000 mL | INTRAMUSCULAR | Status: AC
  Administered 2013-09-02: 25 mL via ORAL

## 2013-09-02 MED ORDER — IOHEXOL 300 MG/ML  SOLN
80.0000 mL | Freq: Once | INTRAMUSCULAR | Status: AC | PRN
  Administered 2013-09-02: 80 mL via INTRAVENOUS

## 2013-09-02 NOTE — Discharge Instructions (Signed)
Constipation Constipation is when a person has fewer than three bowel movements a week, has difficulty having a bowel movement, or has stools that are dry, hard, or larger than normal. As people grow older, constipation is more common. If you try to fix constipation with medicines that make you have a bowel movement (laxatives), the problem may get worse. Long-term laxative use may cause the muscles of the colon to become weak. A low-fiber diet, not taking in enough fluids, and taking certain medicines may make constipation worse.   You were noted on her CT scan to have an abdominal aortic aneurysm as well as a renal cyst that you need to followup with her primary physician to have repeat imaging of.  CAUSES   Certain medicines, such as antidepressants, pain medicine, iron supplements, antacids, and water pills.   Certain diseases, such as diabetes, irritable bowel syndrome (IBS), thyroid disease, or depression.   Not drinking enough water.   Not eating enough fiber-rich foods.   Stress or travel.   Lack of physical activity or exercise.   Ignoring the urge to have a bowel movement.   Using laxatives too much.  SIGNS AND SYMPTOMS   Having fewer than three bowel movements a week.   Straining to have a bowel movement.   Having stools that are hard, dry, or larger than normal.   Feeling full or bloated.   Pain in the lower abdomen.   Not feeling relief after having a bowel movement.  DIAGNOSIS  Your health care provider will take a medical history and perform a physical exam. Further testing may be done for severe constipation. Some tests may include:  A barium enema X-ray to examine your rectum, colon, and, sometimes, your small intestine.   A sigmoidoscopy to examine your lower colon.   A colonoscopy to examine your entire colon. TREATMENT  Treatment will depend on the severity of your constipation and what is causing it. Some dietary treatments include  drinking more fluids and eating more fiber-rich foods. Lifestyle treatments may include regular exercise. If these diet and lifestyle recommendations do not help, your health care provider may recommend taking over-the-counter laxative medicines to help you have bowel movements. Prescription medicines may be prescribed if over-the-counter medicines do not work.  HOME CARE INSTRUCTIONS   Eat foods that have a lot of fiber, such as fruits, vegetables, whole grains, and beans.  Limit foods high in fat and processed sugars, such as french fries, hamburgers, cookies, candies, and soda.   A fiber supplement may be added to your diet if you cannot get enough fiber from foods.   Drink enough fluids to keep your urine clear or pale yellow.   Exercise regularly or as directed by your health care provider.   Go to the restroom when you have the urge to go. Do not hold it.   Only take over-the-counter or prescription medicines as directed by your health care provider. Do not take other medicines for constipation without talking to your health care provider first.  SEEK IMMEDIATE MEDICAL CARE IF:   You have bright red blood in your stool.   Your constipation lasts for more than 4 days or gets worse.   You have abdominal or rectal pain.   You have thin, pencil-like stools.   You have unexplained weight loss. MAKE SURE YOU:   Understand these instructions.  Will watch your condition.  Will get help right away if you are not doing well or get worse. Document Released:  10/20/2003 Document Revised: 01/26/2013 Document Reviewed: 11/02/2012 [facility] Patient Information 2015 [place], [place]. This information is not intended to replace advice given to you by your health care provider. Make sure you discuss any questions you have with your health care provider.

## 2013-09-02 NOTE — ED Notes (Signed)
Per EMS- Pt reports ab pain LLQ intermittently since yesterday. Diarrhea once yesterday. Denies vomiting. Pt comes from home where she lives with her son. Pt has dementia. BP 132/80, HR 80.

## 2013-09-02 NOTE — ED Provider Notes (Addendum)
CSN: [id number]     Arrival date & time 09/02/13  1001 History   First MD Initiated Contact with Patient 09/02/13 1004     Chief Complaint  Patient presents with  . Abdominal Pain     (Consider location/radiation/quality/duration/timing/severity/associated sxs/prior Treatment) HPI  This is a 73 year old female with history of coronary artery disease, hypertension, hyperlipidemia, heart failure who presents with left lower quadrant pain. Patient also has a history of dementia. Patient reports left lower quadrant pain that started yesterday and has been intermittent. It is sharp and nonradiating. It is currently 8/10. She had one episode of nonbloody diarrhea yesterday. Denies any vomiting or nausea. Denies any fevers. She denies any chest pain, shortness of breath. Patient is a generally poor historian and will retract statements.  Past Medical History  Diagnosis Date  . 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 Hypertension   . Hypercholesteremia   . Obesity   . Impaired glucose tolerance   . Anxiety disorder   . Gastritis   . Osteoarthritis   . Heart murmur     from age 50 associated with some hemoptysis  . H/O: hysterectomy   . Atrial fibrillation     a. new 04/2011;  b. Pradaxa  . Chronic diastolic heart failure     Echocardiogram 04/14/11: Moderate LVH, EF 60-65%, mild LAE.  Marland Kitchen Stroke 2007    Residual R sided weakness, uses a wheelchair, walker at home   Past Surgical History  Procedure Laterality Date  . Carpal tunnel release      right  . Tonsillectomy    . 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 Replacement total knee      right   Family History  Problem Relation Age of Onset  . Cancer Mother 60    died  . Heart attack Father 19    died   History  Substance Use Topics  . Smoking status: Never Smoker   . Smokeless tobacco: Not on file  . Alcohol Use: No    OB History   Grav Para Term Preterm Abortions TAB SAB Ect Mult Living                 Review of Systems  Constitutional: Negative for fever.  Respiratory: Negative for chest tightness and shortness of breath.   Cardiovascular: Negative for chest pain.  Gastrointestinal: Positive for abdominal pain and diarrhea. Negative for nausea and vomiting.  Genitourinary: Negative for dysuria.  Musculoskeletal: Negative for back pain.  Skin: Negative for wound.  Neurological: Negative for headaches.  Psychiatric/Behavioral: Positive for confusion.  All other systems reviewed and are negative.     Allergies  Sulfonamide derivatives  Home Medications   Prior to Admission medications   Medication Sig Start Date End Date Taking? Authorizing Provider  amLODipine-benazepril (LOTREL) 5-20 MG per capsule Take 1 capsule by mouth daily.    Historical Provider, MD  esomeprazole (NEXIUM) 20 MG capsule Take 20 mg by mouth daily at 12 noon.    Historical Provider, MD  gentamicin (GARAMYCIN) 0.3 % ophthalmic solution  05/05/13   Historical Provider, MD  metoprolol (LOPRESSOR) 50 MG tablet  06/29/13   Historical Provider, MD  polyethylene glycol (MIRALAX) packet Take 17 g by mouth daily. 09/02/13   Shon Baton, MD   BP 132/61  Pulse 63  Temp(Src) 97.9  F (36.6 C) (Oral)  Resp 10  SpO2 99% Physical Exam  Nursing note and vitals reviewed. Constitutional: No distress.  elderly  HENT:  Head: Normocephalic and atraumatic.  MM dry  Eyes: Pupils are equal, round, and reactive to light.  Cardiovascular: Normal rate, regular rhythm and normal heart sounds.   No murmur heard. Pulmonary/Chest: Effort normal and breath sounds normal. No respiratory distress. She has no wheezes.  Abdominal: Soft. Bowel sounds are normal. She exhibits no mass. There is tenderness. There is no rebound and no guarding.  LLQ ttp without rebound or guarding  Musculoskeletal:  Trace BLE edema  Neurological: She is  alert.  Moves all 4 extremities  Skin: Skin is warm and dry.  Psychiatric: She has a normal mood and affect.    ED Course  Procedures (including critical care time) Labs Review Labs Reviewed  COMPREHENSIVE METABOLIC PANEL - Abnormal; Notable for the following:    GFR calc non Af Amer 54 (*)    GFR calc Af Amer 62 (*)    All other components within normal limits  URINALYSIS, ROUTINE W REFLEX MICROSCOPIC - Abnormal; Notable for the following:    APPearance HAZY (*)    Bilirubin Urine SMALL (*)    Ketones, ur 15 (*)    All other components within normal limits  CBC WITH DIFFERENTIAL  LIPASE, BLOOD  I-STAT CG4 LACTIC ACID, ED    Imaging Review Ct Abdomen Pelvis W Contrast  09/02/2013   CLINICAL DATA:  Periumbilical pain  EXAM: CT ABDOMEN AND PELVIS WITH CONTRAST  TECHNIQUE: Multidetector CT imaging of the abdomen and pelvis was performed using the standard protocol following bolus administration of intravenous contrast. Oral contrast was also administered.  CONTRAST:  80mL OMNIPAQUE IOHEXOL 300 MG/ML  SOLN  COMPARISON:  November 22, 2004  FINDINGS: There is mild bibasilar lung atelectatic change.  Left atrium appears prominent.  Liver is prominent, measuring 17.9 cm in length. No focal liver lesions are appreciable. There is no biliary duct dilatation. Gallbladder wall is not thickened.  Within the spleen, there is again noted a mass with central and peripheral rim calcification measuring 5.1 by 5.1 cm. This appearance is stable compared to the prior study. There is no new splenic lesion.  Pancreas appears normal. There is hypertrophy of the left adrenal gland, stable. There is milder hypertrophy of the right adrenal gland, slightly increased from prior study.  There is a non cystic mass arising from the posterior lower pole the right kidney measuring 9 x 8 mm. Kidneys otherwise appear normal bilaterally. There is no hydronephrosis or other renal mass. There is no renal or ureteral calculus on  either side.  In the pelvis, the rectum is distended with stool. The mid to distal sigmoid is also mildly distended with stool. There is moderate stool elsewhere in the colon. There is mild thickening of the wall of the rectum. Urinary bladder is midline with normal wall thickness. There is no pelvic mass or fluid collection. Appendix region appears within normal limits.  There is no appreciable bowel obstruction. No free air or portal venous air.  There is no ascites, adenopathy, or abscess in the abdomen or pelvis. There is atherosclerotic change in the aorta and iliac arteries. 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 There is no abdominal aortic aneurysm. There is degenerative change in the lumbar spine. There are no blastic or lytic bone lesions.  IMPRESSION:  There is a small mass arising from the lower pole the right kidney posteriorly which was not present previously. As this mass cannot be classified as a cyst, further evaluation is warranted. Further evaluation with pre and post contrast MRI should be considered. Pre and post contrast CT could alternatively be performed, but would likely be of decreased accuracy given lesion size.  Rectum and mid to distal sigmoid colon are distended with stool. Mild rectal wall thickening is likely due to more chronic distension with stool. No fistula or abscess is seen in this area. The surrounding mesenteric in this area appears normal.  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  Liver enlarged without focal lesion.  Calcified lesion in the spleen, upright representing a calcified hematoma. It is stable compared to the prior study and benign in appearance.  Bilateral adrenal hypertrophy with slight increase adrenal hypertrophy on the right compared to prior study. Adrenal hypertrophy on the left is stable.  Enlarged left atrium  of uncertain etiology. Question mitral valve disease as a cause for this finding.  No bowel obstruction. No abscess. The periappendiceal region appears unremarkable.   Electronically Signed   By: Bretta Bang M.D.   On: 09/02/2013 14:04     EKG Interpretation None      MDM   Final diagnoses:  Constipation, unspecified constipation type  Renal lesion  AAA (abdominal aortic aneurysm)   Patient presents with left lower quadrant pain starting yesterday. One episode of diarrhea. She is nontender on exam. The history is limited by dementia.  Basic labwork including lactate and urinalysis obtained. This is largely reassuring.  CT abdomen pelvis performed given persistent reported pain and difficulty obtaining clear history. CT is largely unremarkable with the exception of constipation. Patient does have multiple incidental findings. Patient's family is now at the bedside. I have informed him of the renal cyst as well as the iliac artery aneurysm. At this time I do not feel she needs emergent imaging and followup with her primary care doctor for outpatient imaging. Patient was able to tolerate fluids without difficulty. She will be discharged with followup to her primary physician.  After history, exam, and medical workup I feel the patient has been appropriately medically screened and is safe for discharge home. Pertinent diagnoses were discussed with the patient. Patient was given return precautions.    Shon Baton, MD 09/03/13 [id number]  Shon Baton, MD 09/14/13 [phone]

## 2013-09-03 ENCOUNTER — Emergency Department (HOSPITAL_COMMUNITY): Payer: Medicare Other

## 2013-09-03 ENCOUNTER — Observation Stay (HOSPITAL_COMMUNITY)
Admission: EM | Admit: 2013-09-03 | Discharge: 2013-09-06 | Disposition: A | Discharge status: Skilled Nursing Facility | Payer: Medicare Other | Attending: Internal Medicine | Admitting: Internal Medicine

## 2013-09-03 ENCOUNTER — Encounter (HOSPITAL_COMMUNITY): Payer: Self-pay | Admitting: Emergency Medicine

## 2013-09-03 DIAGNOSIS — E669 Obesity, unspecified: Secondary | ICD-10-CM | POA: Diagnosis not present

## 2013-09-03 DIAGNOSIS — I5030 Unspecified diastolic (congestive) heart failure: Secondary | ICD-10-CM | POA: Diagnosis present

## 2013-09-03 DIAGNOSIS — I4891 Unspecified atrial fibrillation: Secondary | ICD-10-CM | POA: Diagnosis not present

## 2013-09-03 DIAGNOSIS — Z79899 Other long term (current) drug therapy: Secondary | ICD-10-CM | POA: Diagnosis not present

## 2013-09-03 DIAGNOSIS — Z7982 Long term (current) use of aspirin: Secondary | ICD-10-CM | POA: Insufficient documentation

## 2013-09-03 DIAGNOSIS — E739 Lactose intolerance, unspecified: Secondary | ICD-10-CM

## 2013-09-03 DIAGNOSIS — R262 Difficulty in walking, not elsewhere classified: Secondary | ICD-10-CM | POA: Diagnosis present

## 2013-09-03 DIAGNOSIS — I10 Essential (primary) hypertension: Secondary | ICD-10-CM | POA: Diagnosis not present

## 2013-09-03 DIAGNOSIS — I471 Supraventricular tachycardia: Secondary | ICD-10-CM

## 2013-09-03 DIAGNOSIS — I5032 Chronic diastolic (congestive) heart failure: Secondary | ICD-10-CM | POA: Diagnosis not present

## 2013-09-03 DIAGNOSIS — Z01810 Encounter for preprocedural cardiovascular examination: Secondary | ICD-10-CM

## 2013-09-03 DIAGNOSIS — G20A1 Parkinson's disease without dyskinesia, without mention of fluctuations: Principal | ICD-10-CM

## 2013-09-03 DIAGNOSIS — Z882 Allergy status to sulfonamides status: Secondary | ICD-10-CM | POA: Diagnosis not present

## 2013-09-03 DIAGNOSIS — F411 Generalized anxiety disorder: Secondary | ICD-10-CM | POA: Insufficient documentation

## 2013-09-03 DIAGNOSIS — I509 Heart failure, unspecified: Secondary | ICD-10-CM | POA: Diagnosis not present

## 2013-09-03 DIAGNOSIS — I2510 Atherosclerotic heart disease of native coronary artery without angina pectoris: Secondary | ICD-10-CM | POA: Diagnosis not present

## 2013-09-03 DIAGNOSIS — N318 Other neuromuscular dysfunction of bladder: Secondary | ICD-10-CM | POA: Insufficient documentation

## 2013-09-03 DIAGNOSIS — I69998 Other sequelae following unspecified cerebrovascular disease: Secondary | ICD-10-CM | POA: Insufficient documentation

## 2013-09-03 DIAGNOSIS — R609 Edema, unspecified: Secondary | ICD-10-CM

## 2013-09-03 DIAGNOSIS — K219 Gastro-esophageal reflux disease without esophagitis: Secondary | ICD-10-CM | POA: Diagnosis not present

## 2013-09-03 DIAGNOSIS — I482 Chronic atrial fibrillation, unspecified: Secondary | ICD-10-CM

## 2013-09-03 DIAGNOSIS — R29898 Other symptoms and signs involving the musculoskeletal system: Secondary | ICD-10-CM | POA: Diagnosis present

## 2013-09-03 DIAGNOSIS — M1990 Unspecified osteoarthritis, unspecified site: Secondary | ICD-10-CM | POA: Diagnosis not present

## 2013-09-03 DIAGNOSIS — K2970 Gastritis, unspecified, without bleeding: Secondary | ICD-10-CM

## 2013-09-03 DIAGNOSIS — G20 Parkinson's disease: Secondary | ICD-10-CM | POA: Diagnosis not present

## 2013-09-03 DIAGNOSIS — K2990 Gastroduodenitis, unspecified, without bleeding: Secondary | ICD-10-CM

## 2013-09-03 DIAGNOSIS — E7800 Pure hypercholesterolemia, unspecified: Secondary | ICD-10-CM | POA: Diagnosis not present

## 2013-09-03 HISTORY — DX: Parkinson's disease without dyskinesia, without mention of fluctuations: G20.A1

## 2013-09-03 HISTORY — DX: Parkinson's disease: G20

## 2013-09-03 LAB — COMPREHENSIVE METABOLIC PANEL
ALT: 20 U/L (ref 0–35)
AST: 32 U/L (ref 0–37)
Albumin: 3.8 g/dL (ref 3.5–5.2)
Alkaline Phosphatase: 78 U/L (ref 39–117)
Anion gap: 13 (ref 5–15)
BUN: 18 mg/dL (ref 6–23)
CO2: 23 mEq/L (ref 19–32)
Calcium: 9.6 mg/dL (ref 8.4–10.5)
Chloride: 109 mEq/L (ref 96–112)
Creatinine, Ser: 0.94 mg/dL (ref 0.50–1.10)
GFR calc Af Amer: 67 mL/min — ABNORMAL LOW (ref >90)
GFR calc non Af Amer: 58 mL/min — ABNORMAL LOW (ref >90)
Glucose, Bld: 113 mg/dL — ABNORMAL HIGH (ref 70–99)
Potassium: 4.4 mEq/L (ref 3.7–5.3)
Sodium: 145 mEq/L (ref 137–147)
Total Bilirubin: 0.7 mg/dL (ref 0.3–1.2)
Total Protein: 7.2 g/dL (ref 6.0–8.3)

## 2013-09-03 LAB — CBC WITH DIFFERENTIAL/PLATELET
Basophils Absolute: 0 10*3/uL (ref 0.0–0.1)
Basophils Relative: 0 % (ref 0–1)
Eosinophils Absolute: 0.2 10*3/uL (ref 0.0–0.7)
Eosinophils Relative: 2 % (ref 0–5)
HCT: 42.7 % (ref 36.0–46.0)
Hemoglobin: 13.7 g/dL (ref 12.0–15.0)
Lymphocytes Relative: 17 % (ref 12–46)
Lymphs Abs: 1.7 10*3/uL (ref 0.7–4.0)
MCH: 27.5 pg (ref 26.0–34.0)
MCHC: 32.1 g/dL (ref 30.0–36.0)
MCV: 85.7 fL (ref 78.0–100.0)
Monocytes Absolute: 0.7 10*3/uL (ref 0.1–1.0)
Monocytes Relative: 7 % (ref 3–12)
Neutro Abs: 6.9 10*3/uL (ref 1.7–7.7)
Neutrophils Relative %: 74 % (ref 43–77)
Platelets: 224 10*3/uL (ref 150–400)
RBC: 4.98 MIL/uL (ref 3.87–5.11)
RDW: 14.3 % (ref 11.5–15.5)
WBC: 9.5 10*3/uL (ref 4.0–10.5)

## 2013-09-03 LAB — CBC
HCT: 40.3 % (ref 36.0–46.0)
Hemoglobin: 13.4 g/dL (ref 12.0–15.0)
MCH: 27.7 pg (ref 26.0–34.0)
MCHC: 33.3 g/dL (ref 30.0–36.0)
MCV: 83.3 fL (ref 78.0–100.0)
Platelets: 262 10*3/uL (ref 150–400)
RBC: 4.84 MIL/uL (ref 3.87–5.11)
RDW: 14.4 % (ref 11.5–15.5)
WBC: 8.8 10*3/uL (ref 4.0–10.5)

## 2013-09-03 LAB — CREATININE, SERUM
Creatinine, Ser: 0.99 mg/dL (ref 0.50–1.10)
GFR calc Af Amer: 63 mL/min — ABNORMAL LOW (ref >90)
GFR calc non Af Amer: 54 mL/min — ABNORMAL LOW (ref >90)

## 2013-09-03 LAB — TSH: TSH: 3.51 u[IU]/mL (ref 0.350–4.500)

## 2013-09-03 LAB — TROPONIN I: Troponin I: <0.3 ng/mL (ref <0.30)

## 2013-09-03 LAB — VITAMIN B12: Vitamin B-12: 967 pg/mL — ABNORMAL HIGH (ref 211–911)

## 2013-09-03 MED ORDER — POLYETHYLENE GLYCOL 3350 17 G PO PACK
17.0000 g | PACK | Freq: Every day | ORAL | Status: DC
  Administered 2013-09-03 – 2013-09-06 (×4): 17 g via ORAL
  Filled 2013-09-03 (×4): qty 1

## 2013-09-03 MED ORDER — KETOCONAZOLE 2 % EX CREA
TOPICAL_CREAM | Freq: Two times a day (BID) | CUTANEOUS | Status: DC
  Administered 2013-09-03: 23:00:00 via TOPICAL
  Administered 2013-09-04: 1 via TOPICAL
  Administered 2013-09-04 – 2013-09-05 (×3): via TOPICAL
  Administered 2013-09-06: 1 via TOPICAL
  Filled 2013-09-03: qty 15

## 2013-09-03 MED ORDER — ENOXAPARIN SODIUM 40 MG/0.4ML ~~LOC~~ SOLN
40.0000 mg | SUBCUTANEOUS | Status: DC
  Administered 2013-09-03 – 2013-09-05 (×3): 40 mg via SUBCUTANEOUS
  Filled 2013-09-03 (×4): qty 0.4

## 2013-09-03 MED ORDER — AMLODIPINE BESY-BENAZEPRIL HCL 5-20 MG PO CAPS: 1.0000 | ORAL_CAPSULE | Freq: Every day | ORAL | Status: DC

## 2013-09-03 MED ORDER — SODIUM CHLORIDE 0.9 % IJ SOLN
3.0000 mL | Freq: Two times a day (BID) | INTRAMUSCULAR | Status: DC
  Administered 2013-09-03 – 2013-09-04 (×2): 3 mL via INTRAVENOUS

## 2013-09-03 MED ORDER — METOPROLOL TARTRATE 50 MG PO TABS
50.0000 mg | ORAL_TABLET | Freq: Two times a day (BID) | ORAL | Status: DC
  Administered 2013-09-04 – 2013-09-06 (×5): 50 mg via ORAL
  Filled 2013-09-03 (×7): qty 1

## 2013-09-03 MED ORDER — PANTOPRAZOLE SODIUM 40 MG PO TBEC
40.0000 mg | DELAYED_RELEASE_TABLET | Freq: Every day | ORAL | Status: DC
Start: 2013-09-04 — End: 2013-09-06
  Administered 2013-09-04 – 2013-09-06 (×3): 40 mg via ORAL
  Filled 2013-09-03 (×3): qty 1

## 2013-09-03 MED ORDER — ACETAMINOPHEN 325 MG PO TABS: 650.0000 mg | ORAL_TABLET | Freq: Four times a day (QID) | ORAL | Status: DC | PRN

## 2013-09-03 MED ORDER — SODIUM CHLORIDE 0.9 % IV SOLN: 250.0000 mL | INTRAVENOUS | Status: DC | PRN

## 2013-09-03 MED ORDER — BENAZEPRIL HCL 20 MG PO TABS
20.0000 mg | ORAL_TABLET | Freq: Every day | ORAL | Status: DC
  Administered 2013-09-04 – 2013-09-05 (×2): 20 mg via ORAL
  Filled 2013-09-03 (×4): qty 1

## 2013-09-03 MED ORDER — ACETAMINOPHEN 650 MG RE SUPP: 650.0000 mg | Freq: Four times a day (QID) | RECTAL | Status: DC | PRN

## 2013-09-03 MED ORDER — SODIUM CHLORIDE 0.9 % IJ SOLN: 3.0000 mL | INTRAMUSCULAR | Status: DC | PRN

## 2013-09-03 MED ORDER — AMLODIPINE BESYLATE 5 MG PO TABS
5.0000 mg | ORAL_TABLET | Freq: Every day | ORAL | Status: DC
  Administered 2013-09-04 – 2013-09-06 (×3): 5 mg via ORAL
  Filled 2013-09-03 (×3): qty 1

## 2013-09-03 MED ORDER — MIRABEGRON ER 25 MG PO TB24
25.0000 mg | ORAL_TABLET | Freq: Every day | ORAL | Status: DC
  Administered 2013-09-04 – 2013-09-06 (×3): 25 mg via ORAL
  Filled 2013-09-03 (×3): qty 1

## 2013-09-03 MED ORDER — DONEPEZIL HCL 10 MG PO TABS
10.0000 mg | ORAL_TABLET | Freq: Every day | ORAL | Status: DC
  Administered 2013-09-03 – 2013-09-06 (×4): 10 mg via ORAL
  Filled 2013-09-03 (×4): qty 1

## 2013-09-03 MED ORDER — METOPROLOL TARTRATE 50 MG PO TABS
50.0000 mg | ORAL_TABLET | Freq: Two times a day (BID) | ORAL | Status: DC
  Filled 2013-09-03: qty 1

## 2013-09-03 NOTE — ED Notes (Signed)
[facility] for the last several weeks, worse for several days.  Patient has history of Parkinson's and dementia. [facility] gave phone number for Dr. Renae GlossShelton  [phone] her PCP.

## 2013-09-03 NOTE — Progress Notes (Signed)
Pt lethargic, difficult to wake, not talking. Rapid response notified and assessed pt. Sternum rub performed by rapid response, pt woke and began talking. Vitals WDL.

## 2013-09-03 NOTE — ED Provider Notes (Signed)
CSN: [id number]     Arrival date & time 09/03/13  1125 History   First MD Initiated Contact with Patient 09/03/13 1132     Chief Complaint  Patient presents with  . Fatigue    trouble walking     (Consider location/radiation/quality/duration/timing/severity/associated sxs/prior Treatment) Patient is a 73 y.o. female presenting with neurologic complaint. The history is provided by the patient and a caregiver.  Neurologic Problem This is a new problem. The current episode started yesterday. The problem occurs constantly. The problem has been gradually worsening. Associated symptoms include abdominal pain. Pertinent negatives include no chest pain, no headaches and no shortness of breath. Nothing aggravates the symptoms. Nothing relieves the symptoms. She has tried nothing for the symptoms. The treatment provided no relief.    Past Medical History  Diagnosis Date  . 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 Hypertension   . Hypercholesteremia   . Obesity   . Impaired glucose tolerance   . Anxiety disorder   . Gastritis   . Osteoarthritis   . Heart murmur     from age 2 associated with some hemoptysis  . H/O: hysterectomy   . Atrial fibrillation     a. new 04/2011;  b. Pradaxa  . Chronic diastolic heart failure     Echocardiogram 04/14/11: Moderate LVH, EF 60-65%, mild LAE.  Marland Kitchen Stroke 2007    Residual R sided weakness, uses a wheelchair, walker at home  . Parkinson's disease    Past Surgical History  Procedure Laterality Date  . Carpal tunnel release      right  . Tonsillectomy    . 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 Replacement total knee      right   Family History  Problem Relation Age of Onset  . Cancer Mother 35    died  . Heart attack Father 60    died   History  Substance Use Topics  . Smoking status: Never Smoker   . Smokeless tobacco: Not on file   . Alcohol Use: No   OB History   Grav Para Term Preterm Abortions TAB SAB Ect Mult Living                 Review of Systems  Constitutional: Negative for fever and fatigue.  HENT: Negative for congestion and drooling.   Eyes: Negative for pain.  Respiratory: Negative for cough and shortness of breath.   Cardiovascular: Negative for chest pain.  Gastrointestinal: Positive for abdominal pain. Negative for nausea, vomiting and diarrhea.  Genitourinary: Negative for dysuria and hematuria.  Musculoskeletal: Negative for back pain, gait problem and neck pain.  Skin: Negative for color change.  Neurological: Positive for weakness. Negative for dizziness and headaches.  Hematological: Negative for adenopathy.  Psychiatric/Behavioral: Negative for behavioral problems.  All other systems reviewed and are negative.     Allergies  Sulfonamide derivatives  Home Medications   Prior to Admission medications   Medication Sig Start Date End Date Taking? Authorizing Provider  amLODipine-benazepril (LOTREL) 5-20 MG per capsule Take 1 capsule by mouth daily.   Yes Historical Provider, MD  donepezil (ARICEPT) 10 MG tablet Take 10 mg by mouth daily. 07/30/13  Yes Historical Provider, MD  esomeprazole (NEXIUM) 20 MG capsule Take 20 mg by mouth daily at 12 noon.   Yes Historical Provider, MD  metoprolol (LOPRESSOR) 50 MG tablet Take 50 mg by mouth 2 (two) times daily.  06/29/13  Yes Historical Provider, MD  MYRBETRIQ 25 MG TB24 tablet Take 25 mg by mouth daily. 08/26/13  Yes Historical Provider, MD  polyethylene glycol (MIRALAX) packet Take 17 g by mouth daily. 09/02/13   Shon Batonourtney F Horton, MD   BP 131/71  Temp(Src) 98.8 F (37.1 C) (Oral)  Resp 15  SpO2 97% Physical Exam  Nursing note and vitals reviewed. Constitutional: She appears well-developed and well-nourished.  HENT:  Head: Normocephalic and atraumatic.  Mouth/Throat: Oropharynx is clear and moist. No oropharyngeal exudate.  Eyes:  Conjunctivae and EOM are normal. Pupils are equal, round, and reactive to light.  Neck: Normal range of motion. Neck supple.  Cardiovascular: Normal rate, regular rhythm, normal heart sounds and intact distal pulses.  Exam reveals no gallop and no friction rub.   No murmur heard. Pulmonary/Chest: Effort normal and breath sounds normal. No respiratory distress. She has no wheezes.  Abdominal: Soft. Bowel sounds are normal. There is no tenderness. There is no rebound and no guarding.  Mild erythema and maceration noted under the pannus on the right side of the abdomen and right flank.  Musculoskeletal: Normal range of motion. She exhibits edema. She exhibits no tenderness.  Trace pitting edema in the bilateral lower extremities. Worsening right lower extremity than the left. The family states that this is not changed from baseline.  Neurological: She is alert.  speech: normal in context and clarity memory: intact grossly cranial nerves II-XII: intact motor strength: 3/5 strength in LLE, 1/5 strength in RLE. 5/5 strength in bilateral UE's.  sensation: intact to light touch diffusely  cerebellar: pt stopping short of my finger during finger to nose testing bilaterally gait: too weak to ambulate   Skin: Skin is warm and dry.  Psychiatric: She has a normal mood and affect. Her behavior is normal.    ED Course  Procedures (including critical care time) Labs Review Labs Reviewed  CBC WITH DIFFERENTIAL  COMPREHENSIVE METABOLIC PANEL  TROPONIN I    Imaging Review Ct Abdomen Pelvis W Contrast  09/02/2013   CLINICAL DATA:  Periumbilical pain  EXAM: CT ABDOMEN AND PELVIS WITH CONTRAST  TECHNIQUE: Multidetector CT imaging of the abdomen and pelvis was performed using the standard protocol following bolus administration of intravenous contrast. Oral contrast was also administered.  CONTRAST:  80mL OMNIPAQUE IOHEXOL 300 MG/ML  SOLN  COMPARISON:  November 22, 2004  FINDINGS: There is mild bibasilar  lung atelectatic change.  Left atrium appears prominent.  Liver is prominent, measuring 17.9 cm in length. No focal liver lesions are appreciable. There is no biliary duct dilatation. Gallbladder wall is not thickened.  Within the spleen, there is again noted a mass with central and peripheral rim calcification measuring 5.1 by 5.1 cm. This appearance is stable compared to the prior study. There is no new splenic lesion.  Pancreas appears normal. There is hypertrophy of the left adrenal gland, stable. There is milder hypertrophy of the right adrenal gland, slightly increased from prior study.  There is a non cystic mass arising from the posterior lower pole the right kidney measuring 9 x 8 mm. Kidneys otherwise appear normal bilaterally. There is no hydronephrosis or other renal mass. There is no renal or ureteral calculus on either side.  In the pelvis, the rectum is distended with stool. The mid to distal sigmoid is also mildly distended with stool. There is moderate stool elsewhere in the colon.  There is mild thickening of the wall of the rectum. Urinary bladder is midline with normal wall thickness. There is no pelvic mass or fluid collection. Appendix region appears within normal limits.  There is no appreciable bowel obstruction. No free air or portal venous air.  There is no ascites, adenopathy, or abscess in the abdomen or pelvis. There is atherosclerotic change in the aorta and iliac arteries. 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 There is no abdominal aortic aneurysm. There is degenerative change in the lumbar spine. There are no blastic or lytic bone lesions.  IMPRESSION: There is a small mass arising from the lower pole the right kidney posteriorly which was not present previously. As this mass cannot be classified as a cyst, further evaluation is warranted. Further evaluation with pre and post contrast MRI should be  considered. Pre and post contrast CT could alternatively be performed, but would likely be of decreased accuracy given lesion size.  Rectum and mid to distal sigmoid colon are distended with stool. Mild rectal wall thickening is likely due to more chronic distension with stool. No fistula or abscess is seen in this area. The surrounding mesenteric in this area appears normal.  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  Liver enlarged without focal lesion.  Calcified lesion in the spleen, upright representing a calcified hematoma. It is stable compared to the prior study and benign in appearance.  Bilateral adrenal hypertrophy with slight increase adrenal hypertrophy on the right compared to prior study. Adrenal hypertrophy on the left is stable.  Enlarged left atrium of uncertain etiology. Question mitral valve disease as a cause for this finding.  No bowel obstruction. No abscess. The periappendiceal region appears unremarkable.   Electronically Signed   By: Bretta Bang M.D.   On: 09/02/2013 14:04     EKG Interpretation   Date/Time:  Friday September 03 2013 11:36:45 EDT Ventricular Rate:  59 PR Interval:  150 QRS Duration: 103 QT Interval:  427 QTC Calculation: 423 R Axis:   -32 Text Interpretation:  Sinus rhythm Atrial premature complex Left axis  deviation RSR' in V1 or V2, right VCD or RVH No significant change since  last tracing Confirmed by Ellarae Nevitt  MD, Dakari Stabler ([id number]) on 09/03/2013  11:57:54 AM      MDM   Final diagnoses:  Weakness of both lower extremities    12:17 PM 73 y.o. female w hx of CAD, HTN, HLP, Parkinsons, CVA, afib who presents with generalized weakness. The patient was seen and evaluated in the ER yesterday for left lower quadrant pain. She had a noncontributory workup including labs, urinalysis, and CT of abdomen. The family states that she's had generalized weakness since discharge yesterday. Normally she  is able to ambulate with a walker but is not able to stand at all. The patient has no complaints on exam and denies any pain. Will get screening labs and imaging.  I interpreted/reviewed the labs and/or imaging which were non-contributory.  Discussed w/ hospitalist who will admit.     Junius Argyle, MD 09/03/13 [phone]

## 2013-09-03 NOTE — Progress Notes (Signed)
Patient trasfered from ED to 5W10 via stretcher; alert and oriented x 1; no complaints of pain; IV saline locked in RFA; skin intact - rash on patient's pannus (fungal). Orient patient to room and unit;  gave patient care guide; instructed how to use the call bell and  fall risk precautions. Will continue to monitor the patient.
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
## 2013-09-04 LAB — BASIC METABOLIC PANEL
Anion gap: 12 (ref 5–15)
BUN: 18 mg/dL (ref 6–23)
CO2: 22 mEq/L (ref 19–32)
Calcium: 8.8 mg/dL (ref 8.4–10.5)
Chloride: 109 mEq/L (ref 96–112)
Creatinine, Ser: 0.88 mg/dL (ref 0.50–1.10)
GFR calc Af Amer: 73 mL/min — ABNORMAL LOW (ref >90)
GFR calc non Af Amer: 63 mL/min — ABNORMAL LOW (ref >90)
Glucose, Bld: 80 mg/dL (ref 70–99)
Potassium: 4.1 mEq/L (ref 3.7–5.3)
Sodium: 143 mEq/L (ref 137–147)

## 2013-09-04 LAB — CBC
HCT: 37.9 % (ref 36.0–46.0)
Hemoglobin: 12.1 g/dL (ref 12.0–15.0)
MCH: 26.9 pg (ref 26.0–34.0)
MCHC: 31.9 g/dL (ref 30.0–36.0)
MCV: 84.4 fL (ref 78.0–100.0)
Platelets: 271 10*3/uL (ref 150–400)
RBC: 4.49 MIL/uL (ref 3.87–5.11)
RDW: 14.4 % (ref 11.5–15.5)
WBC: 7.9 10*3/uL (ref 4.0–10.5)

## 2013-09-04 LAB — FOLATE RBC: RBC Folate: 894 ng/mL — ABNORMAL HIGH (ref >280)

## 2013-09-04 LAB — VITAMIN D 25 HYDROXY (VIT D DEFICIENCY, FRACTURES): Vit D, 25-Hydroxy: 61 ng/mL (ref 30–89)

## 2013-09-04 MED ORDER — ASPIRIN 81 MG PO CHEW
81.0000 mg | CHEWABLE_TABLET | Freq: Every day | ORAL | Status: DC
  Administered 2013-09-04 – 2013-09-06 (×3): 81 mg via ORAL
  Filled 2013-09-04 (×4): qty 1

## 2013-09-04 MED ORDER — ASPIRIN EC 81 MG PO TBEC: 81.0000 mg | DELAYED_RELEASE_TABLET | Freq: Every day | ORAL | Status: AC

## 2013-09-04 NOTE — Discharge Summary (Addendum)
PATIENT DETAILS Name: [name] Age: 73 y.o. Sex: female Date of Birth: 1940/05/03 MRN: [id number]. Admit Date: 09/03/2013 Admitting Physician: Edsel Petrin, DO NWG:NFAOZHY,QMVHQION R., MD  Recommendations for Outpatient Follow-up:  1. Will need referral to Neurology for continued care of Parkinson's Disease 2. Home Health services arranged, may need SNF placement-will defer to PCP  PRIMARY DISCHARGE DIAGNOSIS:  Principal Problem:   Ambulatory dysfunction Active Problems:   OBESITY   HYPERTENSION, UNSPECIFIED   Diastolic CHF   Lower extremity weakness   Parkinson disease      PAST MEDICAL HISTORY: Past Medical History  Diagnosis Date  . 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 Hypertension   . Hypercholesteremia   . Obesity   . Impaired glucose tolerance   . Anxiety disorder   . Gastritis   . Osteoarthritis   . Heart murmur     from age 53 associated with some hemoptysis  . H/O: hysterectomy   . Atrial fibrillation     a. new 04/2011;  b. Pradaxa  . Chronic diastolic heart failure     Echocardiogram 04/14/11: Moderate LVH, EF 60-65%, mild LAE.  Marland Kitchen Stroke 2007    Residual R sided weakness, uses a wheelchair, walker at home  . Parkinson's disease     patient denies having parkinsons    DISCHARGE MEDICATIONS:   Medication List         amLODipine-benazepril 5-20 MG per capsule  Commonly known as:  LOTREL  Take 1 capsule by mouth daily.     aspirin EC 81 MG tablet  Take 1 tablet (81 mg total) by mouth daily.     donepezil 10 MG tablet  Commonly known as:  ARICEPT  Take 10 mg by mouth daily.     esomeprazole 20 MG capsule  Commonly known as:  NEXIUM  Take 20 mg by mouth daily at 12 noon.     metoprolol 50 MG tablet  Commonly known as:  LOPRESSOR  Take 50 mg by mouth 2 (two) times daily.     MYRBETRIQ 25 MG Tb24 tablet  Generic drug:  mirabegron ER  Take  25 mg by mouth daily.     polyethylene glycol packet  Commonly known as:  MIRALAX  Take 17 g by mouth daily.        ALLERGIES:   Allergies  Allergen Reactions  . Sulfonamide Derivatives Other (See Comments)    Unknown. Pt doesn't remember.    BRIEF HPI:  See H&P, Labs, Consult and Test reports for all details in brief, patient was admitted is a 73 y.o. female with a history of Parkinson's disease, history of CVA, hypertension that presented to the ED with generalized weakness, and inability to walk. Patient has had numerous falls over the past few months. Per patient and son, patient has been declining and falling frequently over the past 2 years!There was no history of fever, nausea, vomiting, abd pain, or diarrhea.  CONSULTATIONS:   None  PERTINENT RADIOLOGIC STUDIES: Dg Chest 2 View  09/03/2013   CLINICAL DATA:  Weakness, rule out infection.  EXAM: CHEST  2 VIEW  COMPARISON:  03/22/2012  FINDINGS: Patient is slightly rotated to the right. Lungs are hypoinflated without focal consolidation or effusion. Cardiomediastinal silhouette and remainder of the exam is unchanged.  IMPRESSION: No active cardiopulmonary disease.   Electronically Signed   By: Elberta Fortis M.D.   On:  09/03/2013 13:20   Ct Head Wo Contrast  09/03/2013   CLINICAL DATA:  Weakness lower extremities right worse than left. History of Parkinson's disease and previous CVA.  EXAM: CT HEAD WITHOUT CONTRAST  TECHNIQUE: Contiguous axial images were obtained from the base of the skull through the vertex without intravenous contrast.  COMPARISON:  10/04/2011.  FINDINGS: The ventricles and cisterns are within normal. There is mild age related atrophic change present. Chronic ischemic microvascular disease is present. No evidence of focal mass, mass effect, shift of midline structures or acute hemorrhage. No definite evidence of acute infarction. Chronic deformity left globe.  IMPRESSION: No acute intracranial findings.  Atrophy  and moderate chronic ischemic microvascular disease.   Electronically Signed   By: Elberta Fortis M.D.   On: 09/03/2013 13:17   Ct Abdomen Pelvis W Contrast  09/02/2013   CLINICAL DATA:  Periumbilical pain  EXAM: CT ABDOMEN AND PELVIS WITH CONTRAST  TECHNIQUE: Multidetector CT imaging of the abdomen and pelvis was performed using the standard protocol following bolus administration of intravenous contrast. Oral contrast was also administered.  CONTRAST:  80mL OMNIPAQUE IOHEXOL 300 MG/ML  SOLN  COMPARISON:  November 22, 2004  FINDINGS: There is mild bibasilar lung atelectatic change.  Left atrium appears prominent.  Liver is prominent, measuring 17.9 cm in length. No focal liver lesions are appreciable. There is no biliary duct dilatation. Gallbladder wall is not thickened.  Within the spleen, there is again noted a mass with central and peripheral rim calcification measuring 5.1 by 5.1 cm. This appearance is stable compared to the prior study. There is no new splenic lesion.  Pancreas appears normal. There is hypertrophy of the left adrenal gland, stable. There is milder hypertrophy of the right adrenal gland, slightly increased from prior study.  There is a non cystic mass arising from the posterior lower pole the right kidney measuring 9 x 8 mm. Kidneys otherwise appear normal bilaterally. There is no hydronephrosis or other renal mass. There is no renal or ureteral calculus on either side.  In the pelvis, the rectum is distended with stool. The mid to distal sigmoid is also mildly distended with stool. There is moderate stool elsewhere in the colon. There is mild thickening of the wall of the rectum. Urinary bladder is midline with normal wall thickness. There is no pelvic mass or fluid collection. Appendix region appears within normal limits.  There is no appreciable bowel obstruction. No free air or portal venous air.  There is no ascites, adenopathy, or abscess in the abdomen or pelvis. There is  atherosclerotic change in the aorta and iliac arteries. 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 There is no abdominal aortic aneurysm. There is degenerative change in the lumbar spine. There are no blastic or lytic bone lesions.  IMPRESSION: There is a small mass arising from the lower pole the right kidney posteriorly which was not present previously. As this mass cannot be classified as a cyst, further evaluation is warranted. Further evaluation with pre and post contrast MRI should be considered. Pre and post contrast CT could alternatively be performed, but would likely be of decreased accuracy given lesion size.  Rectum and mid to distal sigmoid colon are distended with stool. Mild rectal wall thickening is likely due to more chronic distension with stool. No fistula or abscess is seen in this area. The surrounding mesenteric in this area appears normal.  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  Liver enlarged without focal lesion.  Calcified lesion in the spleen, upright representing a calcified hematoma. It is stable compared to the prior study and benign in appearance.  Bilateral adrenal hypertrophy with slight increase adrenal hypertrophy on the right compared to prior study. Adrenal hypertrophy on the left is stable.  Enlarged left atrium of uncertain etiology. Question mitral valve disease as a cause for this finding.  No bowel obstruction. No abscess. The periappendiceal region appears unremarkable.   Electronically Signed   By: Bretta Bang M.D.   On: 09/02/2013 14:04     PERTINENT LAB RESULTS: CBC:  Recent Labs  09/03/13 1635 09/04/13 0557  WBC 8.8 7.9  HGB 13.4 12.1  HCT 40.3 37.9  PLT 262 271   CMET CMP     Component Value Date/Time   NA 143 09/04/2013 0557   K 4.1 09/04/2013 0557   CL 109 09/04/2013 0557   CO2 22 09/04/2013 0557    GLUCOSE 80 09/04/2013 0557   BUN 18 09/04/2013 0557   CREATININE 0.88 09/04/2013 0557   CALCIUM 8.8 09/04/2013 0557   PROT 7.2 09/03/2013 1233   ALBUMIN 3.8 09/03/2013 1233   AST 32 09/03/2013 1233   ALT 20 09/03/2013 1233   ALKPHOS 78 09/03/2013 1233   BILITOT 0.7 09/03/2013 1233   GFRNONAA 63* 09/04/2013 0557   GFRAA 73* 09/04/2013 0557    GFR Estimated Creatinine Clearance: 49.8 ml/min (by C-G formula based on Cr of 0.88). No results found for this basename: LIPASE, AMYLASE,  in the last 72 hours  Recent Labs  09/03/13 1233  TROPONINI <0.30   No components found with this basename: POCBNP,  No results found for this basename: DDIMER,  in the last 72 hours No results found for this basename: HGBA1C,  in the last 72 hours No results found for this basename: CHOL, HDL, LDLCALC, TRIG, CHOLHDL, LDLDIRECT,  in the last 72 hours  Recent Labs  09/03/13 1635  TSH 3.510    Recent Labs  09/03/13 1635  VITAMINB12 967*   Coags: No results found for this basename: PT, INR,  in the last 72 hours Microbiology: No results found for this or any previous visit (from the past 240 hour(s)).   BRIEF HOSPITAL COURSE:   Principal Problem: Generalized weakness/unable to ambulate  - Suspect this to be a chronic issue- after long discussion with family on 8/1-they claimed that the patient was just about able to stand up, apparently has not been able to do this for the past few days.Has chronic weakness, just about able to walk with a walker at baseline and has chronic weakness at baseline. This has been going on for atleast 2 years. She has had numerous recurrent falls. On admission, CT of the head was negative for acute intracranial findings, no evidence of UTI on UA, no evidence of pneumonia on chest x-ray. No other overt source of infection identified. Patient has generalized weakness, no focal deficits identified on exam. Denies any back pain.Suspect that this is just a progression of dementia/Parkinson's  and failure to thrive syndrome. MRI of the brain was negative as well.Patient will require SNF on discharge for rehab.  Essential Hypertension  -Currently controlled  -Continue amlodipine/benazepril, metoprolol   Hx of Afib -rate controlled, given multiple frequent falls-not a candidate for long term anticoagulation.Start ASA.  Parkinson disease/dementia  -Continue Aricept   GERD  -Continue PPI  Overactive bladder  -Continue Myrbetriq   TODAY-DAY OF DISCHARGE:  Subjective:   [name] GarnetBarbara Mcalpine today has no headache,no chest abdominal pain,no new weakness tingling or numbness.  Objective:   Blood pressure 113/63, pulse 66, temperature 97.9 F (36.6 C), temperature source Oral, resp. rate 16, height 5\' 2"  (1.575 m), weight 67.45 kg (148 lb 11.2 oz), SpO2 99.00%.  Intake/Output Summary (Last 24 hours) at 09/06/13 1041 Last data filed at 09/06/13 [id number]  Gross per 24 hour  Intake      0 ml  Output    100 ml  Net   -100 ml   Filed Weights   09/03/13 1552  Weight: 67.45 kg (148 lb 11.2 oz)    Exam Awake Alert, Oriented *3, No new F.N deficits, Normal affect [place].AT,PERRAL Supple Neck,No JVD, No cervical lymphadenopathy appriciated.  Symmetrical Chest wall movement, Good air movement bilaterally, CTAB RRR,No Gallops,Rubs or new Murmurs, No Parasternal Heave +ve B.Sounds, Abd Soft, Non tender, No organomegaly appriciated, No rebound -guarding or rigidity. No Cyanosis, Clubbing or edema, No new Rash or bruise  DISCHARGE CONDITION: Stable  DISPOSITION: SNF  DISCHARGE INSTRUCTIONS:    Activity:  As tolerated with Full fall precautions use walker/cane & assistance as needed  Diet recommendation: Heart Healthy diet   Discharge Instructions   Call MD for:  difficulty breathing, headache or visual disturbances    Complete by:  As directed      Diet - low sodium heart healthy    Complete by:  As directed      Diet - low sodium heart healthy    Complete by:  As directed        Increase activity slowly    Complete by:  As directed      Increase activity slowly    Complete by:  As directed            Follow-up Information   Follow up with Alva GarnetSHELTON,KIMBERLY R., MD. Schedule an appointment as soon as possible for a visit in 1 week.   Specialty:  Internal Medicine   Contact information:   [address] [place] [address] [phone]       Follow up with Levert FeinsteinYAN,YIJUN, MD. Schedule an appointment as soon as possible for a visit in 1 week.   Specialty:  Neurology   Contact information:   [address] [place] [address] [phone]      Total Time spent on discharge equals 45 minutes.  SignedJeoffrey Massed: Adar Rase 09/06/2013 10:41 AM  **Disclaimer: This note may have been dictated with voice recognition software. Similar sounding words can inadvertently be transcribed and this note may contain transcription errors which may not have been corrected upon publication of note.**

## 2013-09-04 NOTE — Evaluation (Signed)
Physical Therapy Evaluation Patient Details Name: [name] MRN: [id number] DOB: [birth year] Today's Date: 09/04/2013   History of Present Illness  Pt admitted for ambulatory disfunction.  Pt has a dx of Parkinson's and reports multi falls at home.  Clinical Impression  Pt admitted with ambulatory disfunction. Pt currently with functional limitations due to the deficits listed below (see PT Problem List). Pt has a dx of Parkinson's. Pt will benefit from skilled PT to increase their independence and safety with mobility to allow discharge to the venue listed below. Recommend SNF at d/c for continued therapy services.  Pt is requiring max assist with transfers and unable to ambulate at time of eval.        Follow Up Recommendations SNF;Supervision/Assistance - 24 hour    Equipment Recommendations  None recommended by PT    Recommendations for Other Services       Precautions / Restrictions Precautions Precautions: Fall Restrictions Weight Bearing Restrictions: No      Mobility  Bed Mobility Overal bed mobility: Needs Assistance Bed Mobility: Rolling;Supine to Sit Rolling: Min assist   Supine to sit: Min assist;HOB elevated     General bed mobility comments: use of bedrails, verbal/tactile cues for sequencing  Transfers Overall transfer level: Needs assistance Equipment used: Rolling walker (2 wheeled) Transfers: Sit to/from [employer] Sit to Stand: Max assist Stand pivot transfers: Max assist;From elevated surface       General transfer comment: difficulty with initiation of movement; unable to obtain full erect stance; poor motor planning  Ambulation/Gait                Stairs            Wheelchair Mobility    Modified Rankin (Stroke Patients Only)       Balance                                             Pertinent Vitals/Pain 0/10    Home Living Family/patient expects to be discharged to:: Skilled  nursing facility Living Arrangements: Children                    Prior Function Level of Independence: Needs assistance   Gait / Transfers Assistance Needed: Mod I with mobility with multi recent falls; assist from son and brother off the floor when she falls  ADL's / Homemaking Assistance Needed: assist with housework, cooking, and transportation        Higher education careers adviser        Extremity/Trunk Assessment   Upper Extremity Assessment: Defer to OT evaluation           Lower Extremity Assessment: Generalized weakness         Communication   Communication: No difficulties  Cognition Arousal/Alertness: Awake/alert Behavior During Therapy: WFL for tasks assessed/performed Overall Cognitive Status: History of cognitive impairments - at baseline Area of Impairment: Memory;Safety/judgement;Problem solving;Orientation Orientation Level: Disoriented to;Time   Memory: Decreased short-term memory   Safety/Judgement: Decreased awareness of safety   Problem Solving: Difficulty sequencing;Requires verbal cues;Requires tactile cues;Decreased initiation      General Comments      Exercises        Assessment/Plan    PT Assessment Patient needs continued PT services  PT Diagnosis Generalized weakness;Abnormality of gait   PT Problem List Decreased strength;Decreased activity tolerance;Decreased balance;Decreased mobility;Decreased coordination;Decreased knowledge  of precautions;Decreased safety awareness;Decreased cognition  PT Treatment Interventions DME instruction;Gait training;Functional mobility training;Therapeutic activities;Therapeutic exercise;Patient/family education;Balance training;Cognitive remediation;Neuromuscular re-education   PT Goals (Current goals can be found in the Care Plan section) Acute Rehab PT Goals Patient Stated Goal: walk better and stop falling PT Goal Formulation: With patient Time For Goal Achievement: 09/18/13 Potential to Achieve  Goals: Good    Frequency Min 3X/week   Barriers to discharge        Co-evaluation               End of Session Equipment Utilized During Treatment: Gait belt Activity Tolerance: Patient tolerated treatment well Patient left: in chair;with nursing/sitter in room;with call bell/phone within reach Nurse Communication: Mobility status;Need for lift equipment (advised to use sara plus if unable to SPT with +2 assist)    Functional Assessment Tool Used: clinical judgement Functional Limitation: Mobility: Walking and moving around Mobility: Walking and Moving Around Current Status ([id number]): At least 60 percent but less than 80 percent impaired, limited or restricted Mobility: Walking and Moving Around Goal Status [phone](G8979): At least 20 percent but less than 40 percent impaired, limited or restricted    Time: 0838-0901 PT Time Calculation (min): 23 min   Charges:   PT Evaluation $Initial PT Evaluation Tier I: 1 Procedure PT Treatments $Therapeutic Activity: 8-22 mins   PT G Codes:   Functional Assessment Tool Used: clinical judgement Functional Limitation: Mobility: Walking and moving around    Ilda FoilGarrow, Salam Micucci Rene 09/04/2013, 9:19 AM  Aida RaiderWendy Beyonca Wisz, PT  Office # [phone] Pager [phone]

## 2013-09-04 NOTE — Clinical Social Work Note (Signed)
Pt family requesting SNF placement.  Pt is OBS status and has [employer] Medicare as insurance carrier.  Pt may follow up with PCP and/or home health social worker as provided by MD for placement from home once medically stable.  Facilities are required to obtain an authorization for SNF benefits from insurance carrier.  This cannot be completed over the weekend. Once pt is medically stable, MD has agreed to order home health social worker to assist with placement needs from home.  CSW signing off.  Vickii PennaGina Stein Windhorst, LCSWA [phone]  Clinical Social Work

## 2013-09-04 NOTE — Progress Notes (Signed)
CARE MANAGEMENT NOTE 09/04/2013  Patient:  [name], [name]   Account Number:  [id number]  Date Initiated:  09/04/2013  Documentation initiated by:  [facility]  Subjective/Objective Assessment:   adm: Ambulatory dysfunction     Action/Plan:   discharge planning   Anticipated DC Date:  09/04/2013   Anticipated DC Plan:  [place]  CM consult      [facility] Choice  HOME HEALTH   Choice offered to / List presented to:  C-5 Sibling        HH arranged  HH-1 RN  [place].   Status of service:  In process, will continue to follow Medicare Important Message given?   (If response is "NO", the following Medicare IM given date fields will be blank) Date Medicare IM given:   Medicare IM given by:   Date Additional Medicare IM given:   Additional Medicare IM given by:    Discharge Disposition:  [place]  Per UR Regulation:    If discussed at Long Length of Stay Meetings, dates discussed:    Comments:  09/04/13 15:39 CM met with pt to discuss discharge plans.  Pt is an observation pt and CSW explained SNF would need to be set up through the PCP and a HHSW.  [place] arrangements  for HHRN/PT/OT/Aide/SW were explained to family and a list of Private Duty agencies was given to family for care they felt they could not provide.  Address and contact information was verified.  Referral called to [facility] rep, Stanton Kidney.  CM was called back to room bc family states it is not safe for the pt to go home.  Dr. Baxter Flattery Cole(niece of pt) was in room and asked to speak to the attending physican. CM called the MD.  MD called CM and discharge will not be today.  CM called [facility] to notify of discharge cancellation. Mariane Masters, BSN, CM [phone].

## 2013-09-04 NOTE — Progress Notes (Addendum)
09/04/13 Patient to be discharged home today. IV site removed and discharge instructions reviewed with patient.Patient  Now is staying till Monday.

## 2013-09-04 NOTE — Progress Notes (Signed)
UR Completed.  [name] Jane [phone] 09/04/2013  

## 2013-09-05 ENCOUNTER — Observation Stay (HOSPITAL_COMMUNITY): Payer: Medicare Other

## 2013-09-05 DIAGNOSIS — G20 Parkinson's disease: Secondary | ICD-10-CM

## 2013-09-05 DIAGNOSIS — I4891 Unspecified atrial fibrillation: Secondary | ICD-10-CM

## 2013-09-05 NOTE — Progress Notes (Signed)
Utilization Review Completed.   Venita Seng, RN, BSN Nurse Case Manager  

## 2013-09-05 NOTE — Care Management Note (Addendum)
Page 1 of 2   09/06/2013     12:07:25 PM CARE MANAGEMENT NOTE 09/06/2013  Patient:  [name], [name]   Account Number:  [id number]  Date Initiated:  09/04/2013  Documentation initiated by:  [facility]  Subjective/Objective Assessment:   adm: Ambulatory dysfunction     Action/Plan:   discharge planning   Anticipated DC Date:  09/06/2013   Anticipated DC Plan:  [place]  CM consult      Choice offered to / List presented to:             Status of service:  Completed, signed off Medicare Important Message given?  NA - LOS <3 / Initial given by admissions (If response is "NO", the following Medicare IM given date fields will be blank) Date Medicare IM given:  09/03/2013 Medicare IM given by:   Date Additional Medicare IM given:  09/05/2013 Additional Medicare IM given by:  [facility] JEFFRIES  Discharge Disposition:  [place]  Per UR Regulation:    If discussed at Long Length of Stay Meetings, dates discussed:    Comments:  09/06/13 Calvin, BSN [phone] patient is for dc to snf today, CSW facilitating.  Patient is observaiton, there is no appeal for obs patients.  09/05/13 18:43 CM notified that  Medicare Appeal is reserved for inpatient medicare recipients. CM brings ABN to pt's room but pt not able to understand complexity of ABN; CM called daughter Chanel to inform her Appeal is reserved for inpatient status pts and her mother is an observation pt. CM explained the ABN and Chanel verbalized understanding and chooses option #1.  Chanel also gives  permission for me to sign for her and place a copy of the ABN in her mother's room.   ABN Copy of form placed in pt's satchel along with Advance directive and initial appeal, one ABN copy placed in Shadow chart, copy for CM file.  Mariane Masters, BSN, CM [phone]. 17:57 CM received call to deliver ABN to pt.  CM gets to unit and pt is discharged.  CM goes to pt's  room and no one is in room except pt.  CM calls Dr Landry Mellow and Dr Landry Mellow gives CM daughter of pt's contact number.  Daughter is Channel Jeffries-Reynolds [phone].  Pt and Channel state they would like to exercise pt's right to appeal the MD's decision of discharge.  Daughter states she is in [place] and requests I help her mother with this process.  CM calls [facility] while in room with pt and gives the demographic information requested by [facility] and then gives [facility] pt's reason to appeal; "I am too sick and too weak to go anywhere, I have to stay until I get better." CM notes on additional IM time of call which is 17:57 on 09/05/13 to [facility].  All aforementioned left on voicemail at [facility].  Copy of additional IM with notation of notice of appeal left in shadow chart, one given to pt (in satchel), and one for CM file.  MD notified of Appeal.   09/04/13 15:39 CM met with pt to discuss discharge plans. Pt is an observation pt and CSW explained SNF would need to be set up through the PCP and a HHSW.  [place] arrangements  for HHRN/PT/OT/Aide/SW were explained to family and a list of Private Duty agencies was given to family for care they felt they could not provide.  Address  and contact information was verified.  Referral called to [facility] rep, Stanton Kidney.  CM was called back to room bc family states it is not safe for the pt to go home.  Dr. Baxter Flattery Cole(niece of pt) was in room and asked to speak to the attending physican. CM called the MD.  MD called CM and discharge will not be today.  CM called [facility] to notify of discharge cancellation. Mariane Masters, BSN, CM [phone].

## 2013-09-05 NOTE — Progress Notes (Addendum)
PATIENT DETAILS Name: [name] Age: 73 y.o. Sex: female Date of Birth: 1940-03-03 Admit Date: 09/03/2013 Admitting Physician Edsel Petrin, DO IRJ:JOACZYS,AYTKZSWF R., MD  Subjective: No major complaints.  Assessment/Plan: Principal Problem: Generalized weakness/unable to ambulate - Suspect this to be a chronic issue- after long discussion with family on 8/1-they claimed that the patient was just about able to stand up, apparently has not been able to do this for the past few days.Has chronic weakness, just about able to walk with a walker. This has been going on for atleast 2 years. She has had numerous recurrent falls. On admission, CT of the head was negative for acute intracranial findings, no evidence of UTI on UA, no evidence of pneumonia on chest x-ray. No other overt source of infection identified. Patient has generalized weakness, no focal deficits identified on exam. Suspect that this is just a progression of dementia/Parkinson's and failure to thrive syndrome. I will check a MRI of the brain to complete workup. Patient will require SNF on discharge. Very difficult situation, patient lives with a handicapped son, who is not able to take care of her at this time. We'll have Child psychotherapist evaluate and arrange for SNF if possible. Family aware, because of insurance issues, patient may need to go home with home health services.  Active Problems: Essential Hypertension  -Currently controlled  -Continue amlodipine/benazepril, metoprolol   Hx of Afib  -rate controlled, given multiple frequent falls-not a candidate for long term anticoagulation.Start ASA.   Parkinson disease/dementia  -Continue Aricept   GERD  -Continue PPI   Overactive bladder  -Continue Myrbetriq   Disposition: Remain inpatient-Hopefully SNF 8/3  DVT Prophylaxis: Prophylactic Lovenox   Code Status: Full code   Family Communication Sister, son,Niece 8/1  Niece-Dr Cole-[phone]-spoke  over phone 8/2 Daughter-Shennel Reynolds-[phone]-spoke over phone-[phone]  Procedures:  None  CONSULTS:  None  Time spent 40 minutes-which includes 50% of the time with face-to-face with patient/ family and coordinating care related to the above assessment and plan.    MEDICATIONS: Scheduled Meds: . amLODipine  5 mg Oral Daily   And  . benazepril  20 mg Oral Daily  . aspirin  81 mg Oral Daily  . donepezil  10 mg Oral Daily  . enoxaparin (LOVENOX) injection  40 mg Subcutaneous Q24H  . ketoconazole   Topical BID  . metoprolol  50 mg Oral BID  . mirabegron ER  25 mg Oral Daily  . pantoprazole  40 mg Oral Daily  . polyethylene glycol  17 g Oral Daily  . sodium chloride  3 mL Intravenous Q12H   Continuous Infusions:  PRN Meds:.sodium chloride, acetaminophen, acetaminophen, sodium chloride  Antibiotics: Anti-infectives   None       PHYSICAL EXAM: Vital signs in last 24 hours: Filed Vitals:   09/04/13 0514 09/04/13 1500 09/04/13 2126 09/05/13 0615  BP: 122/72 129/73 111/70 145/73  Pulse: 58 56 64 55  Temp: 98.4 F (36.9 C) 97.9 F (36.6 C) 97.5 F (36.4 C) 98.1 F (36.7 C)  TempSrc: Oral Oral Oral Oral  Resp: 15 18 16 16   Height:      Weight:      SpO2: 99% 99% 99% 99%    Weight change:  Filed Weights   09/03/13 1552  Weight: 67.45 kg (148 lb 11.2 oz)   Body mass index is 27.19 kg/(m^2).   Gen Exam: Awake and alert with clear speech.   Neck: Supple, No JVD.  Chest: B/L Clear.   CVS: S1 S2 Regular, no murmurs.  Abdomen: soft, BS +, non tender, non distended.  Extremities: no edema, lower extremities warm to touch. Neurologic: Non Focal-but with gen weakness. Skin: No Rash.   Wounds: N/A.    Intake/Output from previous day:  Intake/Output Summary (Last 24 hours) at 09/05/13 0912 Last data filed at 09/05/13 0850  Gross per 24 hour  Intake     90 ml  Output      0 ml  Net     90 ml     LAB RESULTS: CBC  Recent Labs Lab 09/02/13 1100  09/03/13 1233 09/03/13 1635 09/04/13 0557  WBC 9.1 9.5 8.8 7.9  HGB 12.9 13.7 13.4 12.1  HCT 40.4 42.7 40.3 37.9  PLT 233 224 262 271  MCV 84.7 85.7 83.3 84.4  MCH 27.0 27.5 27.7 26.9  MCHC 31.9 32.1 33.3 31.9  RDW 14.3 14.3 14.4 14.4  LYMPHSABS 1.9 1.7  --   --   MONOABS 0.5 0.7  --   --   EOSABS 0.3 0.2  --   --   BASOSABS 0.0 0.0  --   --     Chemistries   Recent Labs Lab 09/02/13 1100 09/03/13 1233 09/03/13 1635 09/04/13 0557  NA 147 145  --  143  K 3.9 4.4  --  4.1  CL 110 109  --  109  CO2 22 23  --  22  GLUCOSE 88 113*  --  80  BUN 23 18  --  18  CREATININE 1.00 0.94 0.99 0.88  CALCIUM 9.3 9.6  --  8.8    CBG: No results found for this basename: GLUCAP,  in the last 168 hours  GFR Estimated Creatinine Clearance: 49.8 ml/min (by C-G formula based on Cr of 0.88).  Coagulation profile No results found for this basename: INR, PROTIME,  in the last 168 hours  Cardiac Enzymes  Recent Labs Lab 09/03/13 1233  TROPONINI <0.30    No components found with this basename: POCBNP,  No results found for this basename: DDIMER,  in the last 72 hours No results found for this basename: HGBA1C,  in the last 72 hours No results found for this basename: CHOL, HDL, LDLCALC, TRIG, CHOLHDL, LDLDIRECT,  in the last 72 hours  Recent Labs  09/03/13 1635  TSH 3.510    Recent Labs  09/03/13 1635  VITAMINB12 967*    Recent Labs  09/02/13 1100  LIPASE 22    Urine Studies No results found for this basename: UACOL, UAPR, USPG, UPH, UTP, UGL, UKET, UBIL, UHGB, UNIT, UROB, ULEU, UEPI, UWBC, URBC, UBAC, CAST, CRYS, UCOM, BILUA,  in the last 72 hours  MICROBIOLOGY: No results found for this or any previous visit (from the past 240 hour(s)).  RADIOLOGY STUDIES/RESULTS: Dg Chest 2 View  09/03/2013   CLINICAL DATA:  Weakness, rule out infection.  EXAM: CHEST  2 VIEW  COMPARISON:  03/22/2012  FINDINGS: Patient is slightly rotated to the right. Lungs are hypoinflated  without focal consolidation or effusion. Cardiomediastinal silhouette and remainder of the exam is unchanged.  IMPRESSION: No active cardiopulmonary disease.   Electronically Signed   By: Elberta Fortisaniel  Boyle M.D.   On: 09/03/2013 13:20   Ct Head Wo Contrast  09/03/2013   CLINICAL DATA:  Weakness lower extremities right worse than left. History of Parkinson's disease and previous CVA.  EXAM: CT HEAD WITHOUT CONTRAST  TECHNIQUE: Contiguous axial images were obtained from the  base of the skull through the vertex without intravenous contrast.  COMPARISON:  10/04/2011.  FINDINGS: The ventricles and cisterns are within normal. There is mild age related atrophic change present. Chronic ischemic microvascular disease is present. No evidence of focal mass, mass effect, shift of midline structures or acute hemorrhage. No definite evidence of acute infarction. Chronic deformity left globe.  IMPRESSION: No acute intracranial findings.  Atrophy and moderate chronic ischemic microvascular disease.   Electronically Signed   By: Elberta Fortis M.D.   On: 09/03/2013 13:17   Ct Abdomen Pelvis W Contrast  09/02/2013   CLINICAL DATA:  Periumbilical pain  EXAM: CT ABDOMEN AND PELVIS WITH CONTRAST  TECHNIQUE: Multidetector CT imaging of the abdomen and pelvis was performed using the standard protocol following bolus administration of intravenous contrast. Oral contrast was also administered.  CONTRAST:  80mL OMNIPAQUE IOHEXOL 300 MG/ML  SOLN  COMPARISON:  November 22, 2004  FINDINGS: There is mild bibasilar lung atelectatic change.  Left atrium appears prominent.  Liver is prominent, measuring 17.9 cm in length. No focal liver lesions are appreciable. There is no biliary duct dilatation. Gallbladder wall is not thickened.  Within the spleen, there is again noted a mass with central and peripheral rim calcification measuring 5.1 by 5.1 cm. This appearance is stable compared to the prior study. There is no new splenic lesion.  Pancreas  appears normal. There is hypertrophy of the left adrenal gland, stable. There is milder hypertrophy of the right adrenal gland, slightly increased from prior study.  There is a non cystic mass arising from the posterior lower pole the right kidney measuring 9 x 8 mm. Kidneys otherwise appear normal bilaterally. There is no hydronephrosis or other renal mass. There is no renal or ureteral calculus on either side.  In the pelvis, the rectum is distended with stool. The mid to distal sigmoid is also mildly distended with stool. There is moderate stool elsewhere in the colon. There is mild thickening of the wall of the rectum. Urinary bladder is midline with normal wall thickness. There is no pelvic mass or fluid collection. Appendix region appears within normal limits.  There is no appreciable bowel obstruction. No free air or portal venous air.  There is no ascites, adenopathy, or abscess in the abdomen or pelvis. There is atherosclerotic change in the aorta and iliac arteries. 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 There is no abdominal aortic aneurysm. There is degenerative change in the lumbar spine. There are no blastic or lytic bone lesions.  IMPRESSION: There is a small mass arising from the lower pole the right kidney posteriorly which was not present previously. As this mass cannot be classified as a cyst, further evaluation is warranted. Further evaluation with pre and post contrast MRI should be considered. Pre and post contrast CT could alternatively be performed, but would likely be of decreased accuracy given lesion size.  Rectum and mid to distal sigmoid colon are distended with stool. Mild rectal wall thickening is likely due to more chronic distension with stool. No fistula or abscess is seen in this area. The surrounding mesenteric in this area appears normal.  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  Liver enlarged without focal lesion.  Calcified lesion in the spleen, upright representing a calcified hematoma. It is stable compared to the prior study and benign in appearance.  Bilateral adrenal hypertrophy with slight increase adrenal hypertrophy on the right compared to prior study. Adrenal hypertrophy on the left is stable.  Enlarged left atrium of uncertain etiology. Question mitral valve disease as a cause for this finding.  No bowel obstruction. No abscess. The periappendiceal region appears unremarkable.   Electronically Signed   By: Bretta Bang M.D.   On: 09/02/2013 14:04    Jeoffrey Massed, MD  [facility] Pager:[phone]  If 7PM-7AM, please contact night-coverage [email or web address] Password [email or web address] 09/05/2013, 9:12 AM   LOS: 2 days   **Disclaimer: This note may have been dictated with voice recognition software. Similar sounding words can inadvertently be transcribed and this note may contain transcription errors which may not have been corrected upon publication of note.**

## 2013-09-06 NOTE — Clinical Social Work Placement (Signed)
Clinical Social Work Department CLINICAL SOCIAL WORK PLACEMENT NOTE 09/06/2013  Patient:  [name],[name]  Account Number:  [id number] Admit date:  09/03/2013  Clinical Social Worker:  Lavell LusterJOSEPH BRYANT Tanette Chauca, LCSWA  Date/time:  09/06/2013 03:02 PM  Clinical Social Work is seeking post-discharge placement for this patient at the following level of care:   SKILLED NURSING   (*CSW will update this form in Epic as items are completed)   09/04/2013  Patient/family provided with [facility] Department of Clinical Social Work's list of facilities offering this level of care within the geographic area requested by the patient (or if unable, by the patient's family).  09/04/2013  Patient/family informed of their freedom to choose among providers that offer the needed level of care, that participate in Medicare, Medicaid or managed care program needed by the patient, have an available bed and are willing to accept the patient.  09/04/2013  Patient/family informed of [facility]' ownership interest in [facility], as well as of the fact that they are under no obligation to receive care at this facility.  PASARR submitted to EDS on 09/04/2013 PASARR number received on 09/04/2013  FL2 transmitted to all facilities in geographic area requested by pt/family on  09/04/2013 FL2 transmitted to all facilities within larger geographic area on   Patient informed that his/her managed care company has contracts with or will negotiate with  certain facilities, including the following:     Patient/family informed of bed offers received:  09/06/2013 [facility] Physician recommends and patient chooses bed at    [name] on  09/06/2013 Patient to be transferred to facility by Ambulance Patient and family notified of transfer on 09/06/2013 Name of family member notified:  Channel  The following physician request were entered in  Epic:   Additional Comments:    Roddie McBryant Abrina Petz MSW, [place], [place], [phone]

## 2013-09-06 NOTE — Progress Notes (Signed)
Nsg Discharge Note  Admit Date:  09/03/2013 Discharge date: 09/06/2013   [name] KinsBarbara J [name] to be D/C'd [name]  per MD order.  AVS completed.  Copy for chart, and copy for patient signed, and dated. Patient/caregiver able to verbalize understanding.  Discharge Medication:   Medication List         amLODipine-benazepril 5-20 MG per capsule  Commonly known as:  LOTREL  Take 1 capsule by mouth daily.     aspirin EC 81 MG tablet  Take 1 tablet (81 mg total) by mouth daily.     donepezil 10 MG tablet  Commonly known as:  ARICEPT  Take 10 mg by mouth daily.     esomeprazole 20 MG capsule  Commonly known as:  NEXIUM  Take 20 mg by mouth daily at 12 noon.     metoprolol 50 MG tablet  Commonly known as:  LOPRESSOR  Take 50 mg by mouth 2 (two) times daily.     MYRBETRIQ 25 MG Tb24 tablet  Generic drug:  mirabegron ER  Take 25 mg by mouth daily.     polyethylene glycol packet  Commonly known as:  MIRALAX  Take 17 g by mouth daily.        Discharge Assessment: Filed Vitals:   09/06/13 1343  BP: 134/85  Pulse: 54  Temp: 98.3 F (36.8 C)  Resp: 20   Skin clean, dry and intact without evidence of skin break down, no evidence of skin tears noted. IV catheter discontinued intact. Site without signs and symptoms of complications - no redness or edema noted at insertion site, patient denies c/o pain - only slight tenderness at site.  Dressing with slight pressure applied.  D/c Instructions-Education: Discharge instructions given to patient/family with verbalized understanding. D/c education completed with patient/family including follow up instructions, medication list, d/c activities limitations if indicated, with other d/c instructions as indicated by MD - patient able to verbalize understanding, all questions fully answered. Patient instructed to return to ED, call 911, or call MD for any changes in condition.  Patient escorted via EMS to Nursing home, [place]  [place] into [place].  Kern ReapBrumagin, Peola Joynt L, RN 09/06/2013 2:55 PM

## 2013-09-06 NOTE — Evaluation (Signed)
Occupational Therapy Evaluation Patient Details Name: [name] KinsBarbara J Araki MRN: [id number] DOB: 05/07/1940 Today's Date: 09/06/2013    History of Present Illness Pt admitted for ambulatory disfunction.  Pt has a dx of Parkinson's and reports multi falls at home.   Clinical Impression   Pleasantly confused female who presents with decreased mobility and history of multiple falls.  Currently requires max to total assist +2 for basic toileting tasks as well as selfcare.  Plans to discharge to SNF later today, which I feel is probably the most appropriate venue as pt does not have 24 hour assist at home.  Recommend continued OT at SNF to increase pt back to supervision level with ADLs.    Follow Up Recommendations  SNF;Supervision/Assistance - 24 hour    Equipment Recommendations  None recommended by OT       Precautions / Restrictions Precautions Precautions: Fall      Mobility Bed Mobility Overal bed mobility: Needs Assistance Bed Mobility: Supine to Sit;Sit to Supine     Supine to sit: Min assist;HOB elevated Sit to supine: Min assist      Transfers Overall transfer level: Needs assistance   Transfers: Stand Pivot Transfers Sit to Stand: Max assist Stand pivot transfers: Max assist       General transfer comment: Pt with decreased ability to advance either LE during stand pivot transfers.     Balance Overall balance assessment: Needs assistance Sitting-balance support: Bilateral upper extremity supported Sitting balance-Leahy Scale: Fair   Postural control: Posterior lean Standing balance support: Bilateral upper extremity supported Standing balance-Leahy Scale: Zero                              ADL Overall ADL's : Needs assistance/impaired Eating/Feeding: Set up;Bed level   Grooming: Wash/dry hands;Wash/dry face;Brushing hair;Sitting;Supervision/safety   Upper Body Bathing: Set up;Sitting   Lower Body Bathing: +2 for physical assistance;Sit to/from  stand   Upper Body Dressing : Supervision/safety;Sitting   Lower Body Dressing: +2 for physical assistance;Sit to/from stand   Toilet Transfer: Stand-pivot;Maximal assistance;BSC   Toileting- ArchitectClothing Manipulation and Hygiene: +2 for physical assistance;Sit to/from stand       Functional mobility during ADLs: +2 for physical assistance;Cueing for sequencing General ADL Comments: Pt with decreased forward weight shift with transition sit to stand.  Posterior lean also observed in standing with decreased ability to advance her LEs during stand pivot transfer.     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                   Perception Perception Perception Tested?: No   Praxis Praxis Praxis tested?: Not tested    Pertinent Vitals/Pain No report of pain, O2 sats 97% on room air     Hand Dominance Right   Extremity/Trunk Assessment Upper Extremity Assessment Upper Extremity Assessment: Generalized weakness;Difficult to assess due to impaired cognition (Pt grossly 3+/5 throughout difficulty following instructional commands related to manual muscle testing;)       Cervical / Trunk Assessment Cervical / Trunk Assessment: Normal   Communication Communication Communication: No difficulties   Cognition Arousal/Alertness: Awake/alert Behavior During Therapy: WFL for tasks assessed/performed Overall Cognitive Status: History of cognitive impairments - at baseline Area of Impairment: Memory;Safety/judgement;Problem solving;Orientation Orientation Level: Disoriented to;Place;Time;Situation   Memory: Decreased short-term memory   Safety/Judgement: Decreased awareness of deficits   Problem Solving: Requires verbal cues General Comments: Pt not oriented to  place, thought she was at [facility].  Decreased ability to follow directions related to manual muscle testing.               Home Living Family/patient expects to be discharged to:: Skilled  nursing facility Living Arrangements: Children                                      Prior Functioning/Environment Level of Independence: Needs assistance  Gait / Transfers Assistance Needed: Mod I with mobility with multi recent falls; assist from son and brother off the floor when she falls ADL's / Homemaking Assistance Needed: assist with housework, cooking, and transportation        OT Diagnosis: Generalized weakness;Cognitive deficits;Altered mental status   OT Problem List: Decreased strength;Impaired balance (sitting and/or standing);Decreased cognition;Decreased safety awareness;Decreased knowledge of use of DME or AE                       End of Session Equipment Utilized During Treatment: Gait belt Nurse Communication: Mobility status  Activity Tolerance: Patient tolerated treatment well Patient left: in bed;with call bell/phone within reach;with bed alarm set   Time: 7425-9563 OT Time Calculation (min): 33 min Charges:  OT General Charges $OT Visit: 1 Procedure OT Evaluation $Initial OT Evaluation Tier I: 1 Procedure OT Treatments $Self Care/Home Management : 23-37 mins G-Codes: OT G-codes **NOT FOR INPATIENT CLASS** Functional Assessment Tool Used: clinical judgement Functional Limitation: Self care Self Care Current Status (O7564): At least 80 percent but less than 100 percent impaired, limited or restricted Self Care Goal Status (P3295): At least 80 percent but less than 100 percent impaired, limited or restricted Self Care Discharge Status [phone]): At least 80 percent but less than 100 percent impaired, limited or restricted  Fiore Detjen OTR/L 09/06/2013, 2:49 PM

## 2013-09-06 NOTE — Progress Notes (Signed)
PATIENT DETAILS Name: [name] Age: 73 y.o. Sex: female Date of Birth: 1940-04-03 Admit Date: 09/03/2013 Admitting Physician Edsel Petrin, DO UEA:[id number],BJYNWGNF R., MD  Subjective: No major complaints overnight. Denies any back pain, nausea, vomiting or diarrhea.  Assessment/Plan: Principal Problem: Generalized weakness/unable to ambulate - Suspect this to be a chronic issue- after long discussion with family on 8/1-they claimed that the patient was just about able to stand up, apparently has not been able to do this for the past few days.Has chronic weakness, just about able to walk with a walker at baseline. This has been going on for atleast 2 years. She has had numerous recurrent falls. On admission, CT of the head was negative for acute intracranial findings, no evidence of UTI on UA, no evidence of pneumonia on chest x-ray. No other overt source of infection identified. Patient has generalized weakness, no focal deficits identified on exam. Suspect that this is just a progression of dementia/Parkinson's and failure to thrive syndrome. MRI of the brain was negative as well.. Patient will require SNF on discharge. Very difficult situation, patient lives with a handicapped son, who is not able to take care of her at this time. We'll have Child psychotherapist evaluate and arrange for SNF if possible. Family aware, because of insurance issues, patient may need to go home with home health services.I have had discussions with daughter and niece over the phone on 8/3 explaining above plan  Active Problems: Essential Hypertension  -Currently controlled  -Continue amlodipine/benazepril, metoprolol   Hx of Afib  -rate controlled, given multiple frequent falls-not a candidate for long term anticoagulation.Start ASA.   Parkinson disease/dementia  -Continue Aricept. Apparently stopped going to her Neurologist over the past few days.  GERD  -Continue PPI   Overactive bladder    -Continue Myrbetriq   Disposition: Remain inpatient-Hopefully SNF 8/3  DVT Prophylaxis: Prophylactic Lovenox   Code Status: Full code   Family Communication Sister, son,Niece 8/1  Niece-Dr Cole-[phone]-spoke over phone 8/2 Daughter-Shennel Reynolds-[phone]-spoke over phone-[phone]  Procedures:  None  CONSULTS:  None  MEDICATIONS: Scheduled Meds: . amLODipine  5 mg Oral Daily   And  . benazepril  20 mg Oral Daily  . aspirin  81 mg Oral Daily  . donepezil  10 mg Oral Daily  . enoxaparin (LOVENOX) injection  40 mg Subcutaneous Q24H  . ketoconazole   Topical BID  . metoprolol  50 mg Oral BID  . mirabegron ER  25 mg Oral Daily  . pantoprazole  40 mg Oral Daily  . polyethylene glycol  17 g Oral Daily  . sodium chloride  3 mL Intravenous Q12H   Continuous Infusions:  PRN Meds:.sodium chloride, acetaminophen, acetaminophen, sodium chloride  Antibiotics: Anti-infectives   None       PHYSICAL EXAM: Vital signs in last 24 hours: Filed Vitals:   09/05/13 0615 09/05/13 1513 09/05/13 2104 09/06/13 0618  BP: 145/73 125/66 136/82 139/75  Pulse: 55 58 56 66  Temp: 98.1 F (36.7 C) 97.5 F (36.4 C) 98.3 F (36.8 C) 97.9 F (36.6 C)  TempSrc: Oral Oral Oral Oral  Resp: 16 16 16 16   Height:      Weight:      SpO2: 99% 98% 98% 99%    Weight change:  Filed Weights   09/03/13 1552  Weight: 67.45 kg (148 lb 11.2 oz)   Body mass index is 27.19 kg/(m^2).   Gen Exam: Awake and alert with clear speech.  Neck: Supple, No JVD.   Chest: B/L Clear.  No rales or rhonchi. CVS: S1 S2 Regular, no murmurs.  Abdomen: soft, BS +, non tender, non distended.  Extremities: no edema, lower extremities warm to touch. Neurologic: Non Focal-but with gen weakness.Lower ext strength-4/5 Skin: No Rash.   Wounds: N/A.    Intake/Output from previous day:  Intake/Output Summary (Last 24 hours) at 09/06/13 0831 Last data filed at 09/06/13 1610  Gross per 24 hour  Intake     90  ml  Output    100 ml  Net    -10 ml     LAB RESULTS: CBC  Recent Labs Lab 09/02/13 1100 09/03/13 1233 09/03/13 1635 09/04/13 0557  WBC 9.1 9.5 8.8 7.9  HGB 12.9 13.7 13.4 12.1  HCT 40.4 42.7 40.3 37.9  PLT 233 224 262 271  MCV 84.7 85.7 83.3 84.4  MCH 27.0 27.5 27.7 26.9  MCHC 31.9 32.1 33.3 31.9  RDW 14.3 14.3 14.4 14.4  LYMPHSABS 1.9 1.7  --   --   MONOABS 0.5 0.7  --   --   EOSABS 0.3 0.2  --   --   BASOSABS 0.0 0.0  --   --     Chemistries   Recent Labs Lab 09/02/13 1100 09/03/13 1233 09/03/13 1635 09/04/13 0557  NA 147 145  --  143  K 3.9 4.4  --  4.1  CL 110 109  --  109  CO2 22 23  --  22  GLUCOSE 88 113*  --  80  BUN 23 18  --  18  CREATININE 1.00 0.94 0.99 0.88  CALCIUM 9.3 9.6  --  8.8    CBG: No results found for this basename: GLUCAP,  in the last 168 hours  GFR Estimated Creatinine Clearance: 49.8 ml/min (by C-G formula based on Cr of 0.88).  Coagulation profile No results found for this basename: INR, PROTIME,  in the last 168 hours  Cardiac Enzymes  Recent Labs Lab 09/03/13 1233  TROPONINI <0.30    No components found with this basename: POCBNP,  No results found for this basename: DDIMER,  in the last 72 hours No results found for this basename: HGBA1C,  in the last 72 hours No results found for this basename: CHOL, HDL, LDLCALC, TRIG, CHOLHDL, LDLDIRECT,  in the last 72 hours  Recent Labs  09/03/13 1635  TSH 3.510    Recent Labs  09/03/13 1635  VITAMINB12 967*   No results found for this basename: LIPASE, AMYLASE,  in the last 72 hours  Urine Studies No results found for this basename: UACOL, UAPR, USPG, UPH, UTP, UGL, UKET, UBIL, UHGB, UNIT, UROB, ULEU, UEPI, UWBC, URBC, UBAC, CAST, CRYS, UCOM, BILUA,  in the last 72 hours  MICROBIOLOGY: No results found for this or any previous visit (from the past 240 hour(s)).  RADIOLOGY STUDIES/RESULTS: Dg Chest 2 View  09/03/2013   CLINICAL DATA:  Weakness, rule out  infection.  EXAM: CHEST  2 VIEW  COMPARISON:  03/22/2012  FINDINGS: Patient is slightly rotated to the right. Lungs are hypoinflated without focal consolidation or effusion. Cardiomediastinal silhouette and remainder of the exam is unchanged.  IMPRESSION: No active cardiopulmonary disease.   Electronically Signed   By: Elberta Fortis M.D.   On: 09/03/2013 13:20   Ct Head Wo Contrast  09/03/2013   CLINICAL DATA:  Weakness lower extremities right worse than left. History of Parkinson's disease and previous CVA.  EXAM: CT HEAD  WITHOUT CONTRAST  TECHNIQUE: Contiguous axial images were obtained from the base of the skull through the vertex without intravenous contrast.  COMPARISON:  10/04/2011.  FINDINGS: The ventricles and cisterns are within normal. There is mild age related atrophic change present. Chronic ischemic microvascular disease is present. No evidence of focal mass, mass effect, shift of midline structures or acute hemorrhage. No definite evidence of acute infarction. Chronic deformity left globe.  IMPRESSION: No acute intracranial findings.  Atrophy and moderate chronic ischemic microvascular disease.   Electronically Signed   By: Elberta Fortis M.D.   On: 09/03/2013 13:17   Ct Abdomen Pelvis W Contrast  09/02/2013   CLINICAL DATA:  Periumbilical pain  EXAM: CT ABDOMEN AND PELVIS WITH CONTRAST  TECHNIQUE: Multidetector CT imaging of the abdomen and pelvis was performed using the standard protocol following bolus administration of intravenous contrast. Oral contrast was also administered.  CONTRAST:  80mL OMNIPAQUE IOHEXOL 300 MG/ML  SOLN  COMPARISON:  November 22, 2004  FINDINGS: There is mild bibasilar lung atelectatic change.  Left atrium appears prominent.  Liver is prominent, measuring 17.9 cm in length. No focal liver lesions are appreciable. There is no biliary duct dilatation. Gallbladder wall is not thickened.  Within the spleen, there is again noted a mass with central and peripheral rim  calcification measuring 5.1 by 5.1 cm. This appearance is stable compared to the prior study. There is no new splenic lesion.  Pancreas appears normal. There is hypertrophy of the left adrenal gland, stable. There is milder hypertrophy of the right adrenal gland, slightly increased from prior study.  There is a non cystic mass arising from the posterior lower pole the right kidney measuring 9 x 8 mm. Kidneys otherwise appear normal bilaterally. There is no hydronephrosis or other renal mass. There is no renal or ureteral calculus on either side.  In the pelvis, the rectum is distended with stool. The mid to distal sigmoid is also mildly distended with stool. There is moderate stool elsewhere in the colon. There is mild thickening of the wall of the rectum. Urinary bladder is midline with normal wall thickness. There is no pelvic mass or fluid collection. Appendix region appears within normal limits.  There is no appreciable bowel obstruction. No free air or portal venous air.  There is no ascites, adenopathy, or abscess in the abdomen or pelvis. There is atherosclerotic change in the aorta and iliac arteries. 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 There is no abdominal aortic aneurysm. There is degenerative change in the lumbar spine. There are no blastic or lytic bone lesions.  IMPRESSION: There is a small mass arising from the lower pole the right kidney posteriorly which was not present previously. As this mass cannot be classified as a cyst, further evaluation is warranted. Further evaluation with pre and post contrast MRI should be considered. Pre and post contrast CT could alternatively be performed, but would likely be of decreased accuracy given lesion size.  Rectum and mid to distal sigmoid colon are distended with stool. Mild rectal wall thickening is likely due to more chronic distension with stool. No fistula or abscess  is seen in this area. The surrounding mesenteric in this area appears normal.  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  Liver enlarged without focal lesion.  Calcified lesion in the spleen, upright representing a calcified hematoma. It  is stable compared to the prior study and benign in appearance.  Bilateral adrenal hypertrophy with slight increase adrenal hypertrophy on the right compared to prior study. Adrenal hypertrophy on the left is stable.  Enlarged left atrium of uncertain etiology. Question mitral valve disease as a cause for this finding.  No bowel obstruction. No abscess. The periappendiceal region appears unremarkable.   Electronically Signed   By: Bretta BangWilliam  Woodruff M.D.   On: 09/02/2013 14:04    Jeoffrey MassedGHIMIRE,Shanna Strength, MD  [facility] Pager:[phone]  If 7PM-7AM, please contact night-coverage [email or web address] Password [email or web address] 09/06/2013, 8:31 AM   LOS: 3 days   **Disclaimer: This note may have been dictated with voice recognition software. Similar sounding words can inadvertently be transcribed and this note may contain transcription errors which may not have been corrected upon publication of note.**

## 2013-09-06 NOTE — Care Management (Signed)
[facility] a call from [facility] of the possible appeal. I advised them that this was an OBV case with no right of appeal. They asked for something in writing referencing that fact- I faxed them a copy of the IP order to [phone]. Note that an ABN was administered yesterday.

## 2013-09-06 NOTE — Clinical Social Work Placement (Signed)
Clinical Social Work Department BRIEF PSYCHOSOCIAL ASSESSMENT 09/06/2013  Patient:  [name],[name]     Account Number:  [id number]     Admit date:  09/03/2013  Clinical Social Worker:  Lavell LusterAMPBELL,Severus Brodzinski BRYANT, LCSWA  Date/Time:  09/06/2013 10:00 AM  Referred by:  Physician  Date Referred:  09/06/2013 Referred for  SNF Placement   Other Referral:   Interview type:  Family Other interview type:   Patient unable to contribute to the assessment.    PSYCHOSOCIAL DATA Living Status:  FAMILY Admitted from facility:   Level of care:   Primary support name:  Channel Primary support relationship to patient:  CHILD, ADULT Degree of support available:   Support is fair.    CURRENT CONCERNS Current Concerns  Post-Acute Placement   Other Concerns:    SOCIAL WORK ASSESSMENT / PLAN CSW spoke with patient's daughter by phone to complete assessment. Patient has been living at home with her "disabled" son per the daughter. Patient's son is not able to take care of the patient. Patient's family is requesting SNF placement for patient. CSW explained SNF search/placement and answered questions. Family requesting [name].   Assessment/plan status:  Psychosocial Support/Ongoing Assessment of Needs Other assessment/ plan:   COmplete Fl2, Fax, PASRR   Information/referral to community resources:   CSW contact information and SNF list given.    PATIENT'S/FAMILY'S RESPONSE TO PLAN OF CARE: Patient's family plans for patient to DC to SNF. CSW will assist.       Roddie McBryant Nai Borromeo MSW, [place] City[place]CS[place], [place], [phone]

## 2013-09-10 LAB — VITAMIN D 1,25 DIHYDROXY
Vitamin D 1, 25 (OH)2 Total: 56 pg/mL (ref 18–72)
Vitamin D2 1, 25 (OH)2: <8 pg/mL
Vitamin D3 1, 25 (OH)2: 56 pg/mL

## 2013-09-30 ENCOUNTER — Ambulatory Visit: Payer: Medicare Other | Admitting: Neurology

## 2013-09-30 ENCOUNTER — Ambulatory Visit (INDEPENDENT_AMBULATORY_CARE_PROVIDER_SITE_OTHER): Payer: Medicare Other | Admitting: Neurology

## 2013-09-30 ENCOUNTER — Encounter: Payer: Self-pay | Admitting: Neurology

## 2013-09-30 VITALS — BP 107/62 | HR 61

## 2013-09-30 DIAGNOSIS — I639 Cerebral infarction, unspecified: Secondary | ICD-10-CM

## 2013-09-30 DIAGNOSIS — F0390 Unspecified dementia without behavioral disturbance: Secondary | ICD-10-CM

## 2013-09-30 DIAGNOSIS — R269 Unspecified abnormalities of gait and mobility: Secondary | ICD-10-CM

## 2013-09-30 DIAGNOSIS — I6350 Cerebral infarction due to unspecified occlusion or stenosis of unspecified cerebral artery: Secondary | ICD-10-CM

## 2013-09-30 MED ORDER — CARBIDOPA-LEVODOPA 25-100 MG PO TABS: ORAL_TABLET | ORAL | Status: DC

## 2013-09-30 NOTE — Progress Notes (Signed)
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  patient, and the family was not sure that Sinemet has truly helped her walking difficulties.  She has lost followup since 2013, no longer taking Sinemet  She used to live at home with her son, She has gradually declining function, to the point of bowel and bladder incontinence, wheelchair-bound for about one year since 2014, since early August 2015, she has difficulty even standing up from wheelchair with assistance, which has prompted admission,   She had extensive evaluations, MRI of the brain showed moderate to severe bilateral periventricular white matter disease, atrophy, no acute lesions, laboratory showed normal CBC, CMP, TSH  She is now discharged current medications, she denies significant pain, family noticed worsening memory loss, confusion, no significant visual hallucination, no agitations  REVIEW OF SYSTEMS: Full 14 system review of systems performed and notable only for gait  difficulty, weakness, joint pain, joint swelling, achy muscles, memory loss, confusion, not enough sleep   ALLERGIES: Allergies  Allergen Reactions  . Sulfonamide Derivatives Other (See Comments)    Unknown. Pt doesn't remember.    HOME MEDICATIONS: Current Outpatient Prescriptions on File Prior to Visit  Medication Sig Dispense Refill  . amLODipine-benazepril (LOTREL) 5-20 MG per capsule Take 1 capsule by mouth daily.      Marland Kitchen aspirin EC 81 MG tablet Take 1 tablet (81 mg total) by mouth daily.      Marland Kitchen donepezil (ARICEPT) 10 MG tablet Take 10 mg by mouth daily.      Marland Kitchen esomeprazole (NEXIUM) 20 MG capsule Take 20 mg by mouth daily at 12 noon.      . metoprolol (LOPRESSOR) 50 MG tablet Take 50 mg by mouth 2 (two) times daily.       Marland Kitchen MYRBETRIQ 25 MG TB24 tablet Take 25 mg by mouth daily.      . polyethylene glycol (MIRALAX) packet Take 17 g by mouth daily.  14 each  0   No current facility-administered medications on file prior to visit.    PAST MEDICAL HISTORY: Past Medical History  Diagnosis Date  . 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 Hypertension   . Hypercholesteremia   . Obesity   . Impaired glucose tolerance   . Anxiety disorder   . Gastritis   . Osteoarthritis   . Heart murmur     from age 19 associated with some hemoptysis  . H/O: hysterectomy   . Atrial fibrillation     a. new  04/2011;  b. Pradaxa  . Chronic diastolic heart failure     Echocardiogram 04/14/11: Moderate LVH, EF 60-65%, mild LAE.  Marland Kitchen Stroke 2007    Residual R sided weakness, uses a wheelchair, walker at home  . Parkinson's disease     patient denies having parkinsons    PAST SURGICAL HISTORY: Past Surgical History  Procedure Laterality Date  . Carpal tunnel release      right  . Tonsillectomy    . 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 Replacement total knee      right    FAMILY  HISTORY: Family History  Problem Relation Age of Onset  . Cancer Mother 71    died  . Heart attack Father 61    died    SOCIAL HISTORY:  History   Social History  . Marital Status: Widowed    Spouse Name: N/A    Number of Children: 2  . Years of Education: college   Occupational History  . retired    Social History Main Topics  . Smoking status: Never Smoker   . Smokeless tobacco: Never Used  . Alcohol Use: No  . Drug Use: No  . Sexual Activity: Not Currently   Other Topics Concern  . Not on file   Social History Narrative   Lives in [place] with son.  Patient is in assisted living right now [facility].  Widow.  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     PHYSICAL EXAM   Filed Vitals:   09/30/13 1020  BP: 107/62  Pulse: 61    Not recorded    Cannot calculate BMI with a height equal to zero.   Generalized: In no acute distress  Neck: Supple, no carotid bruits   Cardiac: Regular rate rhythm  Pulmonary: Clear to auscultation bilaterally  Musculoskeletal: No deformity  Neurological examination  Mentation: Left eye is blind, MMSE 13/30, is not oriented to place, and time, missed 2 out of 3 recalls could not write sentence or copy design,   Cranial nerve II-XII: right pupil  was reactive to light. Extraocular movements were full.  Visual field were full on confrontational test   Facial sensation and strength were normal.  Uvula tongue midline.  Head turning and shoulder shrug and were normal and symmetric.Tongue protrusion into cheek strength was normal.  Motor: right arm pronation drift, mild right leg proximal and distal weakness, mild bilateral upper and lower extremity rigidity, hesitation on repetitive movement, such as wrist opening and closure, rapid finger tapping  Sensory: Intact to fine touch, pinprick,   Coordination: Normal finger to nose, heel-to-shin bilaterally  there was no truncal ataxia  Gait: Even with two people assistance, she tends to lean her body backworse, could not keep standing up position, did not initiate any gait  Deep tendon reflexes: Brachioradialis 2/2, biceps 2/2, triceps 2/2, patellar 2/2, Achilles absent, plantar responses were flexor bilaterally.   DIAGNOSTIC DATA (LABS, IMAGING, TESTING) - I reviewed patient records, labs, notes, testing and imaging myself where available.  Lab Results  Component Value Date   WBC 7.9 09/04/2013   HGB 12.1 09/04/2013   HCT 37.9 09/04/2013   MCV 84.4 09/04/2013   PLT 271 09/04/2013      Component Value Date/Time   NA 143 09/04/2013 0557   K 4.1 09/04/2013 0557   CL  109 09/04/2013 0557   CO2 22 09/04/2013 0557   GLUCOSE 80 09/04/2013 0557   BUN 18 09/04/2013 0557   CREATININE 0.88 09/04/2013 0557   CALCIUM 8.8 09/04/2013 0557   PROT 7.2 09/03/2013 1233   ALBUMIN 3.8 09/03/2013 1233   AST 32 09/03/2013 1233   ALT 20 09/03/2013 1233   ALKPHOS 78 09/03/2013 1233   BILITOT 0.7 09/03/2013 1233   GFRNONAA 63* 09/04/2013 0557   GFRAA 73* 09/04/2013 0557   Lab Results  Component Value Date   CHOL 220* 03/18/2006   HDL 41.7 03/18/2006   LDLDIRECT 90.9 03/18/2006   TRIG 313* 03/18/2006   CHOLHDL 5.3 CALC 03/18/2006   No results found for this basename: HGBA1C   Lab Results  Component Value Date   VITAMINB12 967* 09/03/2013   Lab Results  Component Value Date   TSH 3.510 09/03/2013      ASSESSMENT AND PLAN  [name] is a 73 y.o. female  with progressive worsening gait difficulty, memory loss, Mini-Mental status is 30 out of 30 today, MRI of the brain showed extensive periventricular white matter disease, atrophy, no acute lesions, she continued to have mild right hemiparesis, rigidity, bradykinesia,   Differentiation diagnosis of her gait difficulty including continuation of her central nervous system degenerative disorder, such as dementia, with previous history of stroke, only mild parkinsonian  features,  We will give her a trial of Sinemet again, 25/100 titrating from 1 tid to 2 tid,  if there was no significant change in next visit in 3 months, will tape her off, she is to continue with physical therapy  Levert Feinstein, M.D. Ph.D.  [facility] [address], [address] [place], [place] [address] [phone]

## 2013-10-25 ENCOUNTER — Encounter: Payer: Medicare Other | Admitting: Podiatry

## 2013-10-25 NOTE — Progress Notes (Signed)
   Subjective:    Patient ID: [name], female    DOB: [birth year], 73 y.o.   MRN: [id number]  HPI  Pt presents for nail debridemnt   Review of Systems     Objective:   Physical Exam        Assessment & Plan:

## 2013-11-22 NOTE — Progress Notes (Signed)
Patient cancelled appointment.

## 2014-01-06 ENCOUNTER — Encounter: Payer: Self-pay | Admitting: Neurology

## 2014-01-06 ENCOUNTER — Ambulatory Visit (INDEPENDENT_AMBULATORY_CARE_PROVIDER_SITE_OTHER): Payer: Medicare Other | Admitting: Neurology

## 2014-01-06 VITALS — BP 135/75 | HR 70

## 2014-01-06 DIAGNOSIS — I482 Chronic atrial fibrillation, unspecified: Secondary | ICD-10-CM

## 2014-01-06 DIAGNOSIS — I2510 Atherosclerotic heart disease of native coronary artery without angina pectoris: Secondary | ICD-10-CM

## 2014-01-06 DIAGNOSIS — R29898 Other symptoms and signs involving the musculoskeletal system: Secondary | ICD-10-CM

## 2014-01-06 DIAGNOSIS — R262 Difficulty in walking, not elsewhere classified: Secondary | ICD-10-CM

## 2014-01-06 MED ORDER — QUETIAPINE FUMARATE 25 MG PO TABS: 25.0000 mg | ORAL_TABLET | Freq: Every day | ORAL | Status: DC

## 2014-01-06 NOTE — Progress Notes (Signed)
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  patient, and the family was not sure that Sinemet has truly helped her walking difficulties.  She has lost followup since 2013, no longer taking Sinemet  She used to live at home with her son, She has gradually declining function, to the point of bowel and bladder incontinence, wheelchair-bound for about one year since 2014, since early August 2015, she has difficulty even standing up from wheelchair with assistance, which has prompted admission, discharged to rehab facility.  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  She complains of right foot and right leg pain. She had another trial of Sinemet 25/100 up to 2 tablets 3 times a day, there was no significant improvement in her gait,  Family also noticed that she has evening time agitation, difficulty sleeping at nighttime   REVIEW OF SYSTEMS: Full 14 system review of systems performed and notable only for gait difficulty, weakness, joint pain, joint swelling, achy muscles, memory loss, confusion, not enough sleep   ALLERGIES: Allergies  Allergen Reactions  . Sulfonamide Derivatives Other (See Comments)    Unknown. Pt doesn't remember.    HOME MEDICATIONS: Current Outpatient Prescriptions on File Prior to Visit  Medication Sig Dispense Refill  . amLODipine-benazepril (LOTREL) 5-20 MG per capsule Take 1 capsule by mouth daily.    Marland Kitchen. aspirin EC 81 MG tablet Take 1 tablet (81 mg total) by mouth daily.    . carbidopa-levodopa (SINEMET) 25-100 MG per tablet One po tid xone week, then 2 tabs po tid 180 tablet 11  . donepezil (ARICEPT) 10 MG tablet Take 10 mg by mouth daily.    Marland Kitchen. esomeprazole (NEXIUM) 20 MG capsule Take 20 mg by mouth daily at 12 noon.    . metoprolol (LOPRESSOR) 50 MG tablet Take 50 mg by mouth 2 (two) times daily.     Marland Kitchen. MYRBETRIQ 25 MG TB24 tablet Take 25 mg by mouth daily.    . polyethylene glycol (MIRALAX) packet Take 17 g by mouth daily. 14 each 0   No current facility-administered medications on file prior to visit.    PAST MEDICAL HISTORY: Past Medical History  Diagnosis  Date  . 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 Hypertension   . Hypercholesteremia   . Obesity   . Impaired glucose tolerance   . Anxiety disorder   . Gastritis   . Osteoarthritis   . Heart murmur     from age 73 associated with some hemoptysis  . H/O: hysterectomy   . Atrial  fibrillation     a. new 04/2011;  b. Pradaxa  . Chronic diastolic heart failure     Echocardiogram 04/14/11: Moderate LVH, EF 60-65%, mild LAE.  Marland Kitchen. Stroke 2007    Residual R sided weakness, uses a wheelchair, walker at home  . Parkinson's disease     patient denies having parkinsons    PAST SURGICAL HISTORY: Past Surgical History  Procedure Laterality Date  . Carpal tunnel release      right  . Tonsillectomy    . 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 Replacement total knee      right    FAMILY HISTORY: Family History  Problem Relation Age of Onset  . Cancer Mother 3588    died  . Heart attack Father 8362    died    SOCIAL HISTORY:  History   Social History  . Marital Status: Widowed    Spouse Name: N/A    Number of Children: 2  . Years of Education: college   Occupational History  . retired    Social History Main Topics  . Smoking status: Never Smoker   . Smokeless tobacco: Never Used  . Alcohol Use: No  . Drug Use: No  . Sexual Activity: Not Currently   Other Topics Concern  . Not on file   Social History Narrative   [place] in [place].  Patient is in [facility].  Widow.  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   [employer] of [employer]   Right handed.     PHYSICAL EXAM   Filed Vitals:   01/06/14 1306  BP: 135/75  Pulse: 70    Not recorded      Cannot calculate BMI with a height equal to zero.   Generalized: In no acute distress  Neck: Supple, no carotid bruits   Cardiac: Regular rate rhythm  Pulmonary: Clear to auscultation bilaterally  Musculoskeletal: No deformity  Neurological examination  Mentation: Left eye is blind, MMSE 13/30, is not oriented to place, and time, missed 2 out of 3 recalls could not write sentence or copy design,   Cranial nerve II-XII: right pupil  was reactive to light. 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  Extraocular movements were full.   Visual field were full on confrontational test   Facial sensation and strength were normal.  Uvula tongue midline.  Head turning and shoulder shrug and were normal and symmetric.Tongue protrusion into cheek strength was normal.  Motor: right arm pronation drift, mild right leg proximal and distal weakness, mild bilateral upper and lower extremity rigidity, hesitation on repetitive movement, such as wrist opening and closure, rapid finger tapping  Sensory: Intact to fine touch, pinprick,   Coordination: Normal finger to nose, heel-to-shin bilaterally there was no truncal ataxia  Gait: Even with two people assistance, she tends to lean her body backworse, could not keep standing up position, did not initiate any gait  Deep  tendon reflexes: Brachioradialis 2/2, biceps 2/2, triceps 2/2, patellar 2/2, Achilles absent, plantar responses were flexor bilaterally.   DIAGNOSTIC DATA (LABS, IMAGING, TESTING) - I reviewed patient records, labs, notes, testing and imaging myself where available.  Lab Results  Component Value Date   WBC 7.9 09/04/2013   HGB 12.1 09/04/2013   HCT 37.9 09/04/2013   MCV 84.4 09/04/2013   PLT 271 09/04/2013      Component Value Date/Time   NA 143 09/04/2013 0557   K 4.1 09/04/2013 0557   CL 109 09/04/2013 0557   CO2 22 09/04/2013 0557   GLUCOSE 80 09/04/2013 0557   BUN 18 09/04/2013 0557   CREATININE 0.88 09/04/2013 0557   CALCIUM 8.8 09/04/2013 0557   PROT 7.2 09/03/2013 1233   ALBUMIN 3.8 09/03/2013 1233   AST 32 09/03/2013 1233   ALT 20 09/03/2013 1233   ALKPHOS 78 09/03/2013 1233   BILITOT 0.7 09/03/2013 1233   GFRNONAA 63* 09/04/2013 0557   GFRAA 73* 09/04/2013 0557   Lab Results  Component Value Date   CHOL 220* 03/18/2006   HDL 41.7 03/18/2006   LDLDIRECT 90.9 03/18/2006   TRIG 313* 03/18/2006   CHOLHDL 5.3 CALC 03/18/2006   No results found for: HGBA1C Lab Results  Component Value Date   VITAMINB12 967* 09/03/2013   Lab Results    Component Value Date   TSH 3.510 09/03/2013      ASSESSMENT AND PLAN  TASHENA IBACH is a 73 y.o. 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  1. She has profound gait difficulty, recent progressive worsening incontinence, need to rule out spinal cord pathology 2, proceed with MRI of cervical, MRI of lumbar 3. Add on seroquel 25 mg every night for nighttime agitation  4. RTC in one month    Levert Feinstein, M.D. Ph.D.  [facility] [address], [address] [place], [place] [address] [phone]

## 2014-01-13 ENCOUNTER — Encounter (HOSPITAL_COMMUNITY): Payer: Self-pay | Admitting: Cardiology

## 2014-01-17 ENCOUNTER — Ambulatory Visit: Payer: Medicare Other

## 2014-01-17 ENCOUNTER — Ambulatory Visit (INDEPENDENT_AMBULATORY_CARE_PROVIDER_SITE_OTHER): Payer: Medicare Other | Admitting: Podiatry

## 2014-01-17 DIAGNOSIS — B351 Tinea unguium: Secondary | ICD-10-CM

## 2014-01-17 DIAGNOSIS — M79673 Pain in unspecified foot: Secondary | ICD-10-CM

## 2014-01-17 NOTE — Progress Notes (Signed)
Subjective:     Patient ID: [name], female   DOB: 02/14/1940, 73 y.o.   MRN: [id number]  HPI patient presents with thick yellow toenails both feet that she cannot cut   Review of Systems     Objective:   Physical Exam Neurovascular status intact with thick brittle nailbeds 1-5 both feet that are painful    Assessment:     I can't nail infection with pain 1-5 both feet    Plan:     Debris painful nailbeds with no iatrogenic bleeding noted      

## 2014-03-01 ENCOUNTER — Ambulatory Visit: Payer: Medicare Other | Admitting: Neurology

## 2014-04-01 ENCOUNTER — Encounter: Payer: Self-pay | Admitting: Neurology

## 2014-04-01 ENCOUNTER — Ambulatory Visit (INDEPENDENT_AMBULATORY_CARE_PROVIDER_SITE_OTHER): Payer: Medicare Other | Admitting: Neurology

## 2014-04-01 VITALS — BP 123/73 | HR 56

## 2014-04-01 DIAGNOSIS — F0390 Unspecified dementia without behavioral disturbance: Secondary | ICD-10-CM

## 2014-04-01 DIAGNOSIS — I482 Chronic atrial fibrillation, unspecified: Secondary | ICD-10-CM

## 2014-04-01 DIAGNOSIS — I639 Cerebral infarction, unspecified: Secondary | ICD-10-CM | POA: Diagnosis not present

## 2014-04-01 DIAGNOSIS — R262 Difficulty in walking, not elsewhere classified: Secondary | ICD-10-CM | POA: Diagnosis not present

## 2014-04-01 DIAGNOSIS — R269 Unspecified abnormalities of gait and mobility: Secondary | ICD-10-CM

## 2014-04-01 MED ORDER — QUETIAPINE FUMARATE 25 MG PO TABS: ORAL_TABLET | ORAL | Status: DC

## 2014-04-01 NOTE — Progress Notes (Signed)
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  Patient and the family was not sure that Sinemet has truly helped her walking difficulties.  She has lost followup since 2013, no longer taking Sinemet  She used to live at home with her son, She has gradually declining function, to the point of bowel and bladder incontinence, wheelchair-bound since 2014, since early August 2015, she has difficulty even standing up from wheelchair with assistance, which has prompted admission, discharged to rehab facility.  She had extensive evaluations, MRI of the brain showed moderate to severe bilateral periventricular white matter disease, atrophy, no acute lesions, laboratory showed normal CBC, CMP, TSH  She denies significant pain, family noticed worsening memory loss, confusion, no significant visual hallucination, no agitations  UPDATE 01/06/2014: She is still at rehab, is taking sinemet, she has no significant improvement in her gait,  She still  has headaches, she can watch TV, read newspaper, she denies dysphagia.  She complains of right foot and right leg pain. She had another trial of Sinemet 25/100 up to 2 tablets 3 times a day, there was no significant improvement in her gait,  Family also noticed that she has evening time agitation, difficulty sleeping at nighttime   UPDATE Apr 01 2013: She still stay at the facility, I have added on Seroquel 25mg  qhs, but was stopped by physcian at facility for unknown reasons, she has bilateral frontal area pressure headaches,   She denies low back pain, neck pain, he has not had MRI as previously scheduled, due to schedule conflicts, her son passed away just 2 weeks ago, she is tearful during today's interview.  REVIEW OF SYSTEMS: Full 14 system review of systems performed and notable only for agitation, confusion, depression, walking difficulty, joints pain  ALLERGIES: Allergies  Allergen Reactions  . Sulfonamide Derivatives Other (See Comments)    Unknown. Pt doesn't remember.    HOME MEDICATIONS: Current Outpatient Prescriptions on File Prior to Visit  Medication Sig Dispense Refill  . amLODipine-benazepril (LOTREL) 5-20 MG per capsule Take 1 capsule by mouth daily.    Marland Kitchen. aspirin EC 81 MG tablet Take 1 tablet (81 mg total) by mouth daily.    Marland Kitchen. donepezil (ARICEPT) 10 MG tablet Take 10 mg by mouth daily.    Marland Kitchen. esomeprazole (NEXIUM) 20 MG capsule Take 20 mg by mouth daily at 12 noon.    . polyethylene glycol (MIRALAX) packet Take 17 g by mouth daily. 14 each 0  . QUEtiapine (SEROQUEL) 25 MG tablet Take 1 tablet (25 mg total) by mouth at bedtime. 30 tablet 11  No current facility-administered medications on file prior to visit.    PAST MEDICAL HISTORY: Past Medical History  Diagnosis Date  . 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 Hypertension   . Hypercholesteremia   . Obesity   .  Impaired glucose tolerance   . Anxiety disorder   . Gastritis   . Osteoarthritis   . Heart murmur     from age 89 associated with some hemoptysis  . H/O: hysterectomy   . Atrial fibrillation     a. new 04/2011;  b. Pradaxa  . Chronic diastolic heart failure     Echocardiogram 04/14/11: Moderate LVH, EF 60-65%, mild LAE.  Marland Kitchen Stroke 2007    Residual R sided weakness, uses a wheelchair, walker at home  . Parkinson's disease     patient denies having parkinsons    PAST SURGICAL HISTORY: Past Surgical History  Procedure Laterality Date  . Carpal tunnel release      right  . Tonsillectomy    . Eye surgery      left, blind in her L eye since age 45  . Replacement total knee      right  . Left heart catheterization with coronary angiogram N/A 05/03/2011    Procedure: LEFT HEART CATHETERIZATION WITH CORONARY ANGIOGRAM;  Surgeon: Herby Abraham, MD;  Location: [facility] CATH LAB;  Service: Cardiovascular;  Laterality: N/A;    FAMILY HISTORY: Family History  Problem Relation Age of Onset  . Cancer Mother 20    died  . Heart attack Father 55    died    SOCIAL HISTORY:  History   Social History  . Marital Status: Widowed    Spouse Name: N/A  . Number of Children: 2  . Years of Education: college   Occupational History  . retired    Social History Main Topics  . Smoking status: Never Smoker   . Smokeless tobacco: Never Used  . Alcohol Use: No  . Drug Use: No  . Sexual Activity: Not Currently   Other Topics Concern  . Not on file   Social History Narrative   Lives in [place] with son.  Patient is in assisted living right now [facility].  Widow.  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     PHYSICAL EXAM   Filed Vitals:   04/01/14 1113  BP: 123/73  Pulse: 56    Not recorded      Cannot calculate BMI with a height equal to zero.  PHYSICAL EXAMNIATION:  Gen: NAD, conversant, well  nourised, obese, well groomed                     Cardiovascular: Regular rate rhythm, no peripheral edema, warm, nontender. Eyes: Conjunctivae clear without exudates or hemorrhage Neck: Supple, no carotid bruise. Pulmonary: Clear to auscultation bilaterally   NEUROLOGICAL EXAM:  MENTAL STATUS: Speech:    Speech is normal; fluent and spontaneous with normal comprehension.  Cognition:   MMSE is 15/30 today, she is not oriented to time and place, missed 3 out of 3 recalls, could not spell words backwards, has difficulty copy design, or write a sentence.  CRANIAL NERVES: CN II: Visual fields are full to confrontation. Right pupil round reactive, left eye was absent CN III, IV, VI: extraocular movement are normal. No ptosis. CN  V: Facial sensation is intact to pinprick in all 3 divisions bilaterally. Corneal responses are intact.  CN VII: Face is symmetric with normal eye closure and smile. CN VIII: Hearing is normal to rubbing fingers CN IX, X: Palate elevates symmetrically. Phonation is normal. CN XI: Head turning and shoulder shrug are intact CN XII: Tongue is midline with normal movements and no atrophy.  MOTOR: There is no pronator drift of out-stretched arms. Muscle bulk and tone are normal. Muscle strength is normal.   Shoulder abduction Shoulder external rotation Elbow flexion Elbow extension Wrist flexion Wrist extension Finger abduction Hip flexion Knee flexion Knee extension Ankle dorsi flexion Ankle plantar flexion  R 4+  L 4+    REFLEXES: Reflexes are 2+ and symmetric at the upper extremity, absent at bilateral lower extremity  SENSORY: Length dependent decreased light touch, pinprick to bilateral knee level, absent toe   vibration sensation  COORDINATION: Rapid alternating movements and fine finger movements are intact. There is no dysmetria on finger-to-nose and heel-knee-shin. There are no abnormal or extraneous movements.    GAIT/STANCE: She needs 2 people assistant to get up from seated position, leaning backwards, could not initiate gait     DIAGNOSTIC DATA (LABS, IMAGING, TESTING) - I reviewed patient records, labs, notes, testing and imaging myself where available.  Lab Results  Component Value Date   WBC 7.9 09/04/2013   HGB 12.1 09/04/2013   HCT 37.9 09/04/2013   MCV 84.4 09/04/2013   PLT 271 09/04/2013      Component Value Date/Time   NA 143 09/04/2013 0557   K 4.1 09/04/2013 0557   CL 109 09/04/2013 0557   CO2 22 09/04/2013 0557   GLUCOSE 80 09/04/2013 0557   BUN 18 09/04/2013 0557   CREATININE 0.88 09/04/2013 0557   CALCIUM 8.8 09/04/2013 0557   PROT 7.2 09/03/2013 1233   ALBUMIN 3.8 09/03/2013 1233   AST 32 09/03/2013 1233   ALT 20 09/03/2013 1233   ALKPHOS 78 09/03/2013 1233   BILITOT 0.7 09/03/2013 1233   GFRNONAA 63* 09/04/2013 0557   GFRAA 73* 09/04/2013 0557   Lab Results  Component Value Date   CHOL 220* 03/18/2006   HDL 41.7 03/18/2006   LDLDIRECT 90.9 03/18/2006   TRIG 313* 03/18/2006   CHOLHDL 5.3 CALC 03/18/2006   No results found for: HGBA1C Lab Results  Component Value Date   VITAMINB12 967* 09/03/2013   Lab Results  Component Value Date   TSH 3.510 09/03/2013      ASSESSMENT AND PLAN  ITZAMARA CASAS is a 74 y.o. 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  1. She has profound gait difficulty, recent progressive worsening incontinence, need to rule out spinal cord pathology 2,  proceed with MRI of cervical, MRI of lumbar 3. Add on seroquel 25 mg every night for nighttime agitation  4. RTC in one month    Levert Feinstein, M.D. Ph.D.  [facility] [address] [place], [place]  [address] [phone] 

## 2014-04-14 ENCOUNTER — Inpatient Hospital Stay: Admission: RE | Admit: 2014-04-14 | Payer: Self-pay | Source: Ambulatory Visit

## 2014-04-14 ENCOUNTER — Other Ambulatory Visit: Payer: Self-pay

## 2014-04-28 ENCOUNTER — Ambulatory Visit: Payer: Medicare Other | Admitting: Neurology

## 2014-04-29 ENCOUNTER — Ambulatory Visit
Admission: RE | Admit: 2014-04-29 | Discharge: 2014-04-29 | Disposition: A | Discharge status: Home / Self Care | Payer: Medicare Other | Source: Ambulatory Visit | Attending: Neurology | Admitting: Neurology

## 2014-04-29 DIAGNOSIS — R29898 Other symptoms and signs involving the musculoskeletal system: Secondary | ICD-10-CM | POA: Diagnosis not present

## 2014-04-29 DIAGNOSIS — R262 Difficulty in walking, not elsewhere classified: Secondary | ICD-10-CM

## 2014-04-29 DIAGNOSIS — I482 Chronic atrial fibrillation, unspecified: Secondary | ICD-10-CM

## 2014-04-29 DIAGNOSIS — I2510 Atherosclerotic heart disease of native coronary artery without angina pectoris: Secondary | ICD-10-CM

## 2014-05-10 ENCOUNTER — Telehealth: Payer: Self-pay | Admitting: Neurology

## 2014-05-10 NOTE — Telephone Encounter (Signed)
Left message to return my call.  

## 2014-05-10 NOTE — Telephone Encounter (Signed)
Michelle: Please call patient, MRI of cervical, and lumbar spine showed multilevel degenerative disc disease,  I will go over details at her next follow-up visit in April eighteenth

## 2014-05-11 NOTE — Telephone Encounter (Signed)
Left another message requesting a return call. 

## 2014-05-12 NOTE — Telephone Encounter (Signed)
Attempted a call to patient again - unable to reach - will discuss results via phone if she calls back - otherwise, she has an appointment for results on 05/23/14.

## 2014-05-13 NOTE — Telephone Encounter (Signed)
Pt aware of results and will keep her appt to further review.

## 2014-05-13 NOTE — Telephone Encounter (Signed)
Patient is returning a call. °

## 2014-05-23 ENCOUNTER — Encounter: Payer: Self-pay | Admitting: Neurology

## 2014-05-23 ENCOUNTER — Ambulatory Visit (INDEPENDENT_AMBULATORY_CARE_PROVIDER_SITE_OTHER): Payer: Medicare Other | Admitting: Neurology

## 2014-05-23 VITALS — BP 154/94 | HR 70

## 2014-05-23 DIAGNOSIS — R269 Unspecified abnormalities of gait and mobility: Secondary | ICD-10-CM

## 2014-05-23 DIAGNOSIS — F0390 Unspecified dementia without behavioral disturbance: Secondary | ICD-10-CM

## 2014-05-23 DIAGNOSIS — I639 Cerebral infarction, unspecified: Secondary | ICD-10-CM

## 2014-05-23 DIAGNOSIS — M48061 Spinal stenosis, lumbar region without neurogenic claudication: Secondary | ICD-10-CM

## 2014-05-23 DIAGNOSIS — M4802 Spinal stenosis, cervical region: Secondary | ICD-10-CM | POA: Diagnosis not present

## 2014-05-23 DIAGNOSIS — I482 Chronic atrial fibrillation, unspecified: Secondary | ICD-10-CM

## 2014-05-23 DIAGNOSIS — M4806 Spinal stenosis, lumbar region: Secondary | ICD-10-CM | POA: Diagnosis not present

## 2014-05-23 NOTE — Progress Notes (Signed)
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  Patient and the family was not sure that Sinemet has truly helped her walking difficulties.  She has lost followup since 2013, no longer taking Sinemet  She used to live at home with her son, She has gradually declining function, to the point of bowel and bladder incontinence, wheelchair-bound since 2014, since early August 2015, she has difficulty even standing up from wheelchair with assistance, which has prompted admission, discharged to rehab facility.  She had extensive evaluations, MRI of the brain showed moderate to severe bilateral periventricular white matter disease, atrophy, no acute lesions, laboratory showed normal CBC, CMP, TSH  She denies significant pain, family noticed worsening memory loss, confusion, no significant visual hallucination, no agitations  UPDATE 01/06/2014: She is still at rehab, is taking sinemet, she has no significant improvement in her gait,  She still  has headaches, she can watch TV, read newspaper, she denies dysphagia.  She complains of right foot and right leg pain. She had another trial of Sinemet 25/100 up to 2 tablets 3 times a day, there was no significant improvement in her gait,  Family also noticed that she has evening time agitation, difficulty sleeping at nighttime   UPDATE Apr 01 2013: She still stay at the facility, I have added on Seroquel 25mg  qhs, but was stopped by physcian at facility for unknown reasons, she has bilateral frontal area pressure headaches,   She denies low back pain, neck pain, he has not had MRI as previously scheduled, due to schedule conflicts, her son passed away just 2 weeks ago, she is tearful during today's interview.  UPDATE April 18th 2016:  We have reviewed MRI of the cervical spine showing moderate multilevel degenerative changes , Most significant findings are: Moderate left foraminal narrowing at C3-C4 due to uncovertebral spurring and central disc protrusion. There is encroachment of the left C4 nerve root though there does not appear to be any definite nerve root compression.  Mild spinal stenosis at C4-C5 due to uncovertebral spurring, facet hypertrophy and central disc protrusion. There does not appear to be nerve root compression at this level. Moderate left foraminal narrowing at C5-C6 due to uncovertebral spurring, central disc protrusion and mild facet hypertrophy. There is encroachment upon the left C6 nerve root though there does not appear to be any definite nerve root compression.  MRI of the lumbar spine showing multilevel mild-to-moderate degenerative changes. There is no definite nerve root compression although there could be dynamic impingement of the left L5 nerve root at the left L4-L5 lateral recess due  to moderate stenosis from disc protrusion, mild facet hypertrophy and left ligamentum flavum hypertrophy. There is less potential for nerve root impingement at the other levels.     She is on seroquel 25mg  qhs, which has helped her sleeping better, she complains of neck pain, low back pain, left knee pain, slow worsening gait difficulty, urinary incontinence,    REVIEW OF SYSTEMS: Full 14 system review of systems performed and notable only for agitation, confusion, depression, walking difficulty, joints pain  ALLERGIES: Allergies  Allergen Reactions  . Sulfonamide Derivatives Other (See Comments)    Unknown. Pt doesn't remember.    HOME MEDICATIONS: Current Outpatient Prescriptions on File Prior to Visit  Medication Sig Dispense Refill  . amLODipine-benazepril (LOTREL) 5-20 MG per capsule Take 1 capsule by mouth daily.    Marland Kitchen. aspirin EC 81 MG tablet Take 1 tablet (81 mg total) by mouth daily.    Marland Kitchen. donepezil (ARICEPT) 10 MG tablet Take 10 mg by mouth daily.    Marland Kitchen. esomeprazole (NEXIUM) 20 MG capsule Take 20 mg by mouth daily at 12 noon.    . Melatonin 3 MG CAPS Take by mouth at bedtime.    . metoprolol (LOPRESSOR) 50 MG tablet Take 50 mg by mouth 2 (two) times daily.    . polyethylene glycol (MIRALAX) packet Take 17 g by mouth daily. 14 each 0  . QUEtiapine (SEROQUEL) 25 MG tablet 1-2 tabs qhs    . traMADol (ULTRAM) 50 MG tablet Take by mouth every 6 (six) hours as needed.     No current facility-administered medications on file prior to visit.    PAST MEDICAL HISTORY: Past Medical History  Diagnosis Date  . 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 Hypertension   . Hypercholesteremia   . Obesity   . Impaired glucose tolerance   . Anxiety disorder   . Gastritis   . Osteoarthritis   . Heart murmur     from age 74 associated with some hemoptysis  . H/O: hysterectomy   . Atrial fibrillation     a. new 04/2011;  b. Pradaxa  . Chronic diastolic heart failure     Echocardiogram 04/14/11: Moderate LVH, EF 60-65%, mild LAE.  Marland Kitchen. Stroke 2007    Residual R sided weakness,  uses a wheelchair, walker at home  . Parkinson's disease     patient denies having parkinsons    PAST SURGICAL HISTORY: Past Surgical History  Procedure Laterality Date  . Carpal tunnel release      right  . Tonsillectomy    . 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 Replacement total knee      right  . Left heart catheterization with coronary angiogram N/A 05/03/2011    Procedure: LEFT HEART CATHETERIZATION WITH CORONARY ANGIOGRAM;  Surgeon: Herby Abrahamhomas D Stuckey, MD;  [facility];  Service: Cardiovascular;  Laterality: N/A;    FAMILY HISTORY: Family History  Problem Relation Age of Onset  . Cancer Mother 7088    died  . Heart attack Father 6162    died    SOCIAL HISTORY:  History   Social History  . Marital Status: Widowed    Spouse Name: N/A  . Number of Children: 2  . Years of Education: college   Occupational History  . retired    Social History Main Topics  .  Smoking status: Never Smoker   . Smokeless tobacco: Never Used  . Alcohol Use: No  . Drug Use: No  . Sexual Activity: Not Currently   Other Topics Concern  . Not on file   Social History Narrative   Lives in [place] with son.  Patient is in assisted living right now [facility].  Widow.  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     PHYSICAL EXAM   Filed Vitals:   05/23/14 1030  BP: 154/94  Pulse: 70    Not recorded      There is no weight on file to calculate BMI.  PHYSICAL EXAMNIATION:  Gen: NAD, conversant, well nourised, obese, well groomed                     Cardiovascular: Regular rate rhythm, no peripheral edema, warm, nontender. Eyes: Conjunctivae clear without exudates or hemorrhage Neck: Supple, no carotid bruise. Pulmonary: Clear to auscultation bilaterally   NEUROLOGICAL EXAM:  MENTAL STATUS: Speech:    Speech is normal; fluent and spontaneous with normal comprehension.   Cognition:   MMSE is 15/30 today, she is not oriented to time and place, missed 1 out of 3 recalls, could not spell world backwards, has difficulty copy design, or write a sentence.  CRANIAL NERVES: CN II: Visual fields are full to confrontation. Right pupil round reactive, left eye was absent CN III, IV, VI: extraocular movement are normal. No ptosis. CN V: Facial sensation is intact to pinprick in all 3 divisions bilaterally. Corneal responses are intact.  CN VII: Face is symmetric with normal eye closure and smile. CN VIII: Hearing is normal to rubbing fingers CN IX, X: Palate elevates symmetrically. Phonation is normal. CN XI: Head turning and shoulder shrug are intact CN XII: Tongue is midline with normal movements and no atrophy.  MOTOR: There is no pronator drift of out-stretched arms. Muscle bulk and tone are normal. Muscle strength is normal.   Shoulder abduction Shoulder external rotation Elbow flexion Elbow extension Wrist flexion Wrist extension Finger abduction Hip flexion Knee flexion Knee extension Ankle dorsi flexion Ankle plantar flexion  R 4+  L 4+    REFLEXES: Reflexes are 2+ and symmetric at the upper extremity, absent at bilateral lower extremity  SENSORY: Length dependent decreased light touch, pinprick to bilateral knee level, absent toe   vibration sensation  COORDINATION: Rapid alternating movements and fine finger movements are intact. There is no dysmetria on finger-to-nose and heel-knee-shin. There are no abnormal or extraneous movements.   GAIT/STANCE: She could not bear weight, even with 2 people assistant     DIAGNOSTIC DATA (LABS, IMAGING, TESTING) - I reviewed patient records, labs, notes, testing and imaging myself where available.  Lab Results  Component Value Date   WBC 7.9 09/04/2013   HGB 12.1 09/04/2013   HCT 37.9 09/04/2013   MCV 84.4 09/04/2013   PLT 271 09/04/2013      Component Value  Date/Time   NA 143 09/04/2013 0557   K 4.1 09/04/2013 0557   CL 109 09/04/2013 0557   CO2 22 09/04/2013 0557   GLUCOSE 80 09/04/2013 0557   BUN 18 09/04/2013 0557   CREATININE 0.88 09/04/2013 0557   CALCIUM 8.8 09/04/2013 0557   PROT 7.2 09/03/2013 1233  ALBUMIN 3.8 09/03/2013 1233   AST 32 09/03/2013 1233   ALT 20 09/03/2013 1233   ALKPHOS 78 09/03/2013 1233   BILITOT 0.7 09/03/2013 1233   GFRNONAA 63* 09/04/2013 0557   GFRAA 73* 09/04/2013 0557   Lab Results  Component Value Date   CHOL 220* 03/18/2006   HDL 41.7 03/18/2006   LDLDIRECT 90.9 03/18/2006   TRIG 313* 03/18/2006   CHOLHDL 5.3 CALC 03/18/2006   No results found for: HGBA1C Lab Results  Component Value Date   VITAMINB12 967* 09/03/2013   Lab Results  Component Value Date   TSH 3.510 09/03/2013      ASSESSMENT AND PLAN  BRIGETT ESTELL is a 74 y.o. female presented with progressive worsening gait difficulty, memory loss, MRI of the brain showed extensive periventricular white matter disease, atrophy, no acute lesions, she continued to have mild right hemiparesis, rigidity, bradykinesia,   1. 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 Ph.D.  [facility] [address], [address] [place], [place] [address] [phone]

## 2014-08-04 ENCOUNTER — Encounter: Payer: Self-pay | Admitting: Podiatry

## 2014-08-04 ENCOUNTER — Ambulatory Visit (INDEPENDENT_AMBULATORY_CARE_PROVIDER_SITE_OTHER): Payer: Medicare Other | Admitting: Podiatry

## 2014-08-04 DIAGNOSIS — B351 Tinea unguium: Secondary | ICD-10-CM | POA: Diagnosis not present

## 2014-08-04 DIAGNOSIS — M79674 Pain in right toe(s): Secondary | ICD-10-CM | POA: Diagnosis not present

## 2014-08-04 DIAGNOSIS — M79676 Pain in unspecified toe(s): Secondary | ICD-10-CM

## 2014-08-04 DIAGNOSIS — M79675 Pain in left toe(s): Secondary | ICD-10-CM

## 2014-08-04 NOTE — Progress Notes (Signed)
Patient ID: [name] KinsBarbara J [name], female   DOB: 07/04/1940, 74 y.o.   MRN: [id number] HPI  Complaint:  Visit Type: Patient returns to my office for continued preventative foot care services. Complaint: Patient states" my nails have grown long and thick and become painful to walk and wear shoes" .She presents for preventative foot care services. No changes to ROS  Podiatric Exam: Vascular: dorsalis pedis and posterior tibial pulses are negative. Capillary return is immediate. Temperature gradient is negative. Skin turgor WNL, bilateral swelling .  Purplish feet B/L Sensorium: Normal Semmes Weinstein monofilament test. Normal tactile sensation bilaterally.  Nail Exam: Pt has thick disfigured discolored nails with subungual debris noted bilateral entire nail hallux through fifth toenails Ulcer Exam: There is no evidence of ulcer or pre-ulcerative changes or infection. Orthopedic Exam: Muscle tone and strength are WNL. No limitations in general ROM. No crepitus or effusions noted. Foot type and digits show no abnormalities. Bony prominences are unremarkable. Skin: No Porokeratosis. No infection or ulcers  Diagnosis:  Tinea unguium, Pain in right toe, pain in left toes  Treatment & Plan Procedures and Treatment: Consent by patient was obtained for treatment procedures. The patient understood the discussion of treatment and procedures well. All questions were answered thoroughly reviewed. Debridement of mycotic and hypertrophic toenails, 1 through 5 bilateral and clearing of subungual debris. No ulceration, no infection noted.  Return Visit-Office Procedure: Patient instructed to return to the office for a follow up visit 3 months for continued evaluation and treatment.

## 2014-11-24 ENCOUNTER — Encounter: Payer: Self-pay | Admitting: Neurology

## 2014-11-24 ENCOUNTER — Ambulatory Visit (INDEPENDENT_AMBULATORY_CARE_PROVIDER_SITE_OTHER): Payer: Medicare Other | Admitting: Neurology

## 2014-11-24 VITALS — BP 158/90 | HR 82

## 2014-11-24 DIAGNOSIS — R262 Difficulty in walking, not elsewhere classified: Secondary | ICD-10-CM

## 2014-11-24 DIAGNOSIS — F0390 Unspecified dementia without behavioral disturbance: Secondary | ICD-10-CM

## 2014-11-24 NOTE — Progress Notes (Signed)
Chief Complaint  Patient presents with  . Memory Loss    MMSE 18/30 - 7 animals.  She is here with her daughter, [name]  Feels her memory is stable. She is unable to stand unassisted.  Physical therapy has caused her increased pain and she is no longer participating in the program.      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  Patient and the family was not sure that Sinemet has truly helped her walking difficulties.  She has lost followup since 2013, no longer taking Sinemet.  She used to live at home with her son, She has gradually declining function, to the point of bowel and bladder incontinence, wheelchair-bound since 2014, since early August 2015, she has difficulty even standing up from wheelchair with assistance, which has prompted admission, discharged to rehab facility.  She had extensive evaluations, MRI of the brain showed moderate to severe bilateral periventricular white matter disease, atrophy, no acute lesions,  laboratory showed normal CBC, CMP, TSH  She denies significant pain, family noticed worsening memory loss, confusion, no significant visual hallucination, no agitations  UPDATE 01/06/2014: She is still at rehab, is taking sinemet, she has no significant improvement in her gait,  She still has headaches, she can watch TV, read newspaper, she denies dysphagia.  She complains of right foot and right leg pain. She had another trial of Sinemet 25/100 up to 2 tablets 3 times a day, there was no significant improvement in her gait,  Family also noticed that she has evening time agitation, difficulty sleeping at nighttime   UPDATE Apr 01 2013: She still stay at the facility, I have added on Seroquel  qhs, but was stopped by physcian at facility for unknown reasons, she has bilateral frontal area pressure headaches,   She denies low back pain, neck pain, she has not had MRI as previously scheduled, due to schedule conflicts, her son passed away just 2 weeks ago, she is tearful during today's interview.  UPDATE April 18th 2016:  We have reviewed MRI of the cervical spine March 2016,    moderate multilevel degenerative changes , Most significant findings are: Moderate left foraminal narrowing at C3-C4 due to uncovertebral spurring and central disc protrusion. There is encroachment of the left C4 nerve root though there does not appear to be any definite nerve root compression.  Mild spinal stenosis at C4-C5 due to uncovertebral spurring, facet hypertrophy and central disc protrusion. There does not appear to be nerve root compression at this level. Moderate left foraminal narrowing at C5-C6 due to uncovertebral spurring, central disc protrusion  and mild facet hypertrophy. There is encroachment upon the left C6 nerve root though there does not appear to be any definite nerve root compression.  MRI of the lumbar spine showing multilevel mild-to-moderate degenerative changes. There is no definite nerve  root compression although there could be dynamic impingement of the left L5 nerve root at the left L4-L5 lateral recess due to moderate stenosis from disc protrusion, mild facet hypertrophy and left ligamentum flavum hypertrophy. There is less potential for nerve root impingement at the other levels.   She is on seroquel 25mg  qhs, which has helped her sleeping better, she complains of neck pain, low back pain, left knee pain, slow worsening gait difficulty, urinary incontinence,   UPDATE Nov 24 2014: She is with her daughter at today's clinical visit, she still lives at nursing home, no longer ambulatory, she is no longer taking Sinemet, continue taking seroquel 25 mg 1-2 tablets every night for agitations.   REVIEW OF SYSTEMS: Full 14 system review of systems performed and notable only for eye itching, joint pain, joint swelling, walking difficulty, memory loss, agitation, confusion.  ALLERGIES: Allergies  Allergen Reactions  . Sulfonamide Derivatives Other (See Comments)    Unknown. Pt doesn't remember.    HOME MEDICATIONS: Current Outpatient Prescriptions on File Prior to Visit  Medication Sig Dispense Refill  . amLODipine-benazepril (LOTREL) 5-20 MG per capsule Take 1 capsule by mouth daily.    Marland Kitchen. aspirin EC 81 MG tablet Take 1 tablet (81 mg total) by mouth daily.    Marland Kitchen. donepezil (ARICEPT) 10 MG tablet Take 10 mg by mouth daily.    Marland Kitchen. esomeprazole (NEXIUM) 20 MG capsule Take 20 mg by mouth daily at 12 noon.    . Melatonin 3 MG CAPS Take by mouth at bedtime.    . metoprolol (LOPRESSOR) 50 MG tablet Take 50 mg by mouth 2 (two) times daily.    . polyethylene glycol (MIRALAX) packet Take 17 g by mouth daily. 14 each 0  . QUEtiapine (SEROQUEL) 25 MG tablet 1-2 tabs qhs    . traMADol (ULTRAM) 50 MG tablet Take by mouth every 6 (six) hours as needed.     No current facility-administered medications on file prior to visit.    PAST MEDICAL HISTORY: Past Medical History  Diagnosis Date  .  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 Hypertension   . Hypercholesteremia   . Obesity   . Impaired glucose tolerance   . Anxiety disorder   . Gastritis   . Osteoarthritis   . Heart murmur     from age 74 associated with some hemoptysis  . H/O: hysterectomy   . Atrial fibrillation (HCC)     a. new 04/2011;  b. Pradaxa  . Chronic diastolic heart failure (HCC)     Echocardiogram 04/14/11: Moderate LVH, EF 60-65%, mild LAE.  Marland Kitchen. Stroke [facility](HCC) 2007    Residual R sided weakness, uses a wheelchair, walker at home  . Parkinson's disease (HCC)     patient denies having parkinsons    PAST SURGICAL HISTORY: Past Surgical History  Procedure Laterality Date  . Carpal tunnel release      right  . Tonsillectomy    . Eye surgery      left, blind in her L eye since age 89  . Replacement total knee      right  . Left heart catheterization with coronary angiogram N/A  05/03/2011    Procedure: LEFT HEART CATHETERIZATION WITH CORONARY ANGIOGRAM;  Surgeon: Herby Abraham, MD;  Location: [facility] CATH LAB;  Service: Cardiovascular;  Laterality: N/A;    FAMILY HISTORY: Family History  Problem Relation Age of Onset  . Cancer Mother 18    died  . Heart attack Father 81    died    SOCIAL HISTORY:  Social History   Social History  . Marital Status: Widowed    Spouse Name: N/A  . Number of Children: 2  . Years of Education: college   Occupational History  . retired    Social History Main Topics  . Smoking status: Never Smoker   . Smokeless tobacco: Never Used  . Alcohol Use: No  . Drug Use: No  . Sexual Activity: Not Currently   Other Topics Concern  . Not on file   Social History Narrative   Lives in [place] with son.  Patient is in assisted living right now [facility].  Widow.  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      PHYSICAL EXAM   Filed Vitals:   11/24/14 0940  BP: 158/90  Pulse: 82    Not recorded      There is no weight on file to calculate BMI.  PHYSICAL EXAMNIATION:  Gen: NAD, conversant, well nourised, obese, well groomed                     Cardiovascular: Regular rate rhythm, no peripheral edema, warm, nontender. Eyes: Conjunctivae clear without exudates or hemorrhage Neck: Supple, no carotid bruise. Pulmonary: Clear to auscultation bilaterally   NEUROLOGICAL EXAM:  MENTAL STATUS: Speech:    Speech is normal; fluent and spontaneous with normal comprehension.  Cognition: Mini-Mental status 18 out of 30, animal naming 7    She is pleasant, cooperative on examinations, animal naming 7  She is not oriented to date, year, place, season, clinic. Registrations was normal Attention and calculation: She was not able to spell world backwards Recall: Missed one out of 3  Language: Normal naming, repeating, she is able to follow three-step commands, she has difficulty copy design, able to write simple sentences.   CRANIAL NERVES: CN II: Visual fields are full to confrontation. Right pupil round reactive, left eye was absent CN III, IV, VI: extraocular movement are normal. No ptosis. CN V: Facial sensation is intact to pinprick in all 3 divisions bilaterally. Corneal responses are intact.  CN VII: Face is symmetric with normal eye closure and smile. CN VIII: Hearing is normal to rubbing fingers CN IX, X: Palate elevates symmetrically. Phonation is normal. CN XI: Head turning and shoulder shrug are intact CN XII: Tongue is midline with normal movements and no atrophy.  MOTOR: She has right middle finger trigger finger, mild rigidity, fixation of right upper extremity upon rapid rotating movement, decreased right leg spontaneous movement,   REFLEXES: Reflexes are 2+ and symmetric at the upper extremity, absent at bilateral lower extremity  SENSORY: Length dependent decreased  light touch, pinprick to bilateral knee level, absent toe   vibration sensation  COORDINATION: Rapid alternating movements and fine finger movements are intact. There is no dysmetria on finger-to-nose and heel-knee-shin.   GAIT/STANCE Deferred   DIAGNOSTIC DATA (LABS, IMAGING, TESTING) - I reviewed patient records, labs, notes, testing and imaging myself where available.   ASSESSMENT AND PLAN  [name] is a 74  y.o. female presented with progressive worsening gait difficulty, memory loss, MRI of the brain showed extensive periventricular white matter disease, atrophy, no acute lesions, she continued to have mild right hemiparesis, rigidity, bradykinesia,   1. 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 Ph.D.  [facility] [address] [place], [place] [address] [phone]

## 2014-12-01 ENCOUNTER — Ambulatory Visit (INDEPENDENT_AMBULATORY_CARE_PROVIDER_SITE_OTHER): Payer: Medicare Other | Admitting: Podiatry

## 2014-12-01 DIAGNOSIS — B351 Tinea unguium: Secondary | ICD-10-CM

## 2014-12-01 DIAGNOSIS — M79674 Pain in right toe(s): Secondary | ICD-10-CM | POA: Diagnosis not present

## 2014-12-01 DIAGNOSIS — M79675 Pain in left toe(s): Secondary | ICD-10-CM

## 2014-12-01 NOTE — Progress Notes (Signed)
Patient ID: [name] KinsBarbara J [name], female   DOB: 11/06/1940, 74 y.o.   MRN: [id number] HPI  Complaint:  Visit Type: Patient returns to my office for continued preventative foot care services. Complaint: Patient states" my nails have grown long and thick and become painful to walk and wear shoes" .She presents for preventative foot care services. No changes to ROS  Podiatric Exam: Vascular: dorsalis pedis and posterior tibial pulses are negative. Capillary return is immediate. Temperature gradient is negative. Skin turgor WNL, bilateral swelling .  Purplish feet B/L Sensorium: Normal Semmes Weinstein monofilament test. Normal tactile sensation bilaterally.  Nail Exam: Pt has thick disfigured discolored nails with subungual debris noted bilateral entire nail hallux through fifth toenails Ulcer Exam: There is no evidence of ulcer or pre-ulcerative changes or infection. Orthopedic Exam: Muscle tone and strength are WNL. No limitations in general ROM. No crepitus or effusions noted. Foot type and digits show no abnormalities. Bony prominences are unremarkable. Severe HAV deformities B/l Skin: No Porokeratosis. No infection or ulcers  Diagnosis:  Tinea unguium, Pain in right toe, pain in left toes  Treatment & Plan Procedures and Treatment: Consent by patient was obtained for treatment procedures. The patient understood the discussion of treatment and procedures well. All questions were answered thoroughly reviewed. Debridement of mycotic and hypertrophic toenails, 1 through 5 bilateral and clearing of subungual debris. No ulceration, no infection noted.  Return Visit-Office Procedure: Patient instructed to return to the office for a follow up visit 3 months for continued evaluation and treatment.

## 2015-03-01 ENCOUNTER — Ambulatory Visit (INDEPENDENT_AMBULATORY_CARE_PROVIDER_SITE_OTHER): Payer: Medicare Other | Admitting: Podiatry

## 2015-03-01 ENCOUNTER — Encounter: Payer: Self-pay | Admitting: Podiatry

## 2015-03-01 DIAGNOSIS — M79674 Pain in right toe(s): Secondary | ICD-10-CM | POA: Diagnosis not present

## 2015-03-01 DIAGNOSIS — B351 Tinea unguium: Secondary | ICD-10-CM

## 2015-03-01 DIAGNOSIS — M79676 Pain in unspecified toe(s): Secondary | ICD-10-CM

## 2015-03-01 DIAGNOSIS — M79675 Pain in left toe(s): Secondary | ICD-10-CM

## 2015-03-01 NOTE — Addendum Note (Signed)
Addended by: Harlon Flor, Jiselle Sheu L on: 03/01/2015 03:27 PM   Modules accepted: Medications

## 2015-03-01 NOTE — Progress Notes (Signed)
Patient ID: [name], female   DOB: 10/20/1940, 75 y.o.   MRN: [id number] HPI  Complaint:  Visit Type: Patient returns to my office for continued preventative foot care services. Complaint: Patient states" my nails have grown long and thick and become painful to walk and wear shoes" .She presents for preventative foot care services. No changes to ROS  Podiatric Exam: Vascular: dorsalis pedis and posterior tibial pulses are negative. Capillary return is immediate. Temperature gradient is negative. Skin turgor WNL, bilateral swelling .  Purplish feet B/L Sensorium: Normal Semmes Weinstein monofilament test. Normal tactile sensation bilaterally.  Nail Exam: Pt has thick disfigured discolored nails with subungual debris noted bilateral entire nail hallux through fifth toenails Ulcer Exam: There is no evidence of ulcer or pre-ulcerative changes or infection. Orthopedic Exam: Muscle tone and strength are WNL. No limitations in general ROM. No crepitus or effusions noted. Foot type and digits show no abnormalities. Bony prominences are unremarkable. Severe HAV deformities B/l Skin: No Porokeratosis. No infection or ulcers  Diagnosis:  Tinea unguium, Pain in right toe, pain in left toes  Treatment & Plan Procedures and Treatment: Consent by patient was obtained for treatment procedures. The patient understood the discussion of treatment and procedures well. All questions were answered thoroughly reviewed. Debridement of mycotic and hypertrophic toenails, 1 through 5 bilateral and clearing of subungual debris. No ulceration, no infection noted.  Return Visit-Office Procedure: Patient instructed to return to the office for a follow up visit 3 months for continued evaluation and treatment.   Princesa Willig DPM  

## 2015-03-03 ENCOUNTER — Ambulatory Visit: Payer: Medicare Other | Admitting: Podiatry

## 2015-05-24 ENCOUNTER — Ambulatory Visit: Payer: Medicare Other | Admitting: Podiatry

## 2015-06-07 ENCOUNTER — Ambulatory Visit (INDEPENDENT_AMBULATORY_CARE_PROVIDER_SITE_OTHER): Payer: Medicare Other | Admitting: Podiatry

## 2015-06-07 ENCOUNTER — Encounter: Payer: Self-pay | Admitting: Podiatry

## 2015-06-07 DIAGNOSIS — M79675 Pain in left toe(s): Secondary | ICD-10-CM | POA: Diagnosis not present

## 2015-06-07 DIAGNOSIS — M79676 Pain in unspecified toe(s): Secondary | ICD-10-CM

## 2015-06-07 DIAGNOSIS — B351 Tinea unguium: Secondary | ICD-10-CM | POA: Diagnosis not present

## 2015-06-07 NOTE — Progress Notes (Signed)
Patient ID: [name], female   DOB: 10/01/1940, 75 y.o.   MRN: [id number] HPI  Complaint:  Visit Type: Patient returns to my office for continued preventative foot care services. Complaint: Patient states" my nails have grown long and thick and become painful to walk and wear shoes" .She presents for preventative foot care services. No changes to ROS  Podiatric Exam: Vascular: dorsalis pedis and posterior tibial pulses are negative. Capillary return is immediate. Temperature gradient is negative. Skin turgor WNL, bilateral swelling .  Purplish feet B/L Sensorium: Normal Semmes Weinstein monofilament test. Normal tactile sensation bilaterally.  Nail Exam: Pt has thick disfigured discolored nails with subungual debris noted bilateral entire nail hallux through fifth toenails Ulcer Exam: There is no evidence of ulcer or pre-ulcerative changes or infection. Orthopedic Exam: Muscle tone and strength are WNL. No limitations in general ROM. No crepitus or effusions noted. Foot type and digits show no abnormalities. Bony prominences are unremarkable. Severe HAV deformities B/l Skin: No Porokeratosis. No infection or ulcers  Diagnosis:  Tinea unguium, Pain in right toe, pain in left toes  Treatment & Plan Procedures and Treatment: Consent by patient was obtained for treatment procedures. The patient understood the discussion of treatment and procedures well. All questions were answered thoroughly reviewed. Debridement of mycotic and hypertrophic toenails, 1 through 5 bilateral and clearing of subungual debris. No ulceration, no infection noted.  Return Visit-Office Procedure: Patient instructed to return to the office for a follow up visit 3 months for continued evaluation and treatment.   Katherine Tout DPM  

## 2015-08-27 ENCOUNTER — Encounter (HOSPITAL_COMMUNITY): Payer: Self-pay | Admitting: Pharmacy Technician

## 2015-09-07 ENCOUNTER — Encounter: Payer: Self-pay | Admitting: Podiatry

## 2015-09-07 ENCOUNTER — Ambulatory Visit (INDEPENDENT_AMBULATORY_CARE_PROVIDER_SITE_OTHER): Payer: Medicare Other | Admitting: Podiatry

## 2015-09-07 DIAGNOSIS — M79675 Pain in left toe(s): Secondary | ICD-10-CM

## 2015-09-07 DIAGNOSIS — B351 Tinea unguium: Secondary | ICD-10-CM

## 2015-09-07 DIAGNOSIS — M79676 Pain in unspecified toe(s): Secondary | ICD-10-CM | POA: Diagnosis not present

## 2015-09-07 NOTE — Progress Notes (Signed)
Patient ID: [name], female   DOB: 04-12-1940, 75 y.o.   MRN: [id number] HPI  Complaint:  Visit Type: Patient returns to my office for continued preventative foot care services. Complaint: Patient states" my nails have grown long and thick and become painful to walk and wear shoes" .She presents for preventative foot care services. No changes to ROS  Podiatric Exam: Vascular: dorsalis pedis and posterior tibial pulses are negative. Capillary return is immediate. Temperature gradient is negative. Skin turgor WNL, bilateral swelling .  Purplish feet B/L Sensorium: Normal Semmes Weinstein monofilament test. Normal tactile sensation bilaterally.  Nail Exam: Pt has thick disfigured discolored nails with subungual debris noted bilateral entire nail hallux through fifth toenails Ulcer Exam: There is no evidence of ulcer or pre-ulcerative changes or infection. Orthopedic Exam: Muscle tone and strength are WNL. No limitations in general ROM. No crepitus or effusions noted. Foot type and digits show no abnormalities. Bony prominences are unremarkable. Severe HAV deformities B/l Skin: No Porokeratosis. No infection or ulcers  Diagnosis:  Tinea unguium, Pain in right toe, pain in left toes  Treatment & Plan Procedures and Treatment: Consent by patient was obtained for treatment procedures. The patient understood the discussion of treatment and procedures well. All questions were answered thoroughly reviewed. Debridement of mycotic and hypertrophic toenails, 1 through 5 bilateral and clearing of subungual debris. No ulceration, no infection noted.  Return Visit-Office Procedure: Patient instructed to return to the office for a follow up visit 3 months for continued evaluation and treatment.   Helane Gunther DPM

## 2015-12-05 ENCOUNTER — Ambulatory Visit: Payer: Medicare Other | Admitting: Podiatry

## 2015-12-07 ENCOUNTER — Ambulatory Visit: Payer: Medicare Other | Admitting: Podiatry

## 2015-12-07 ENCOUNTER — Encounter: Payer: Self-pay | Admitting: Podiatry

## 2015-12-07 ENCOUNTER — Ambulatory Visit (INDEPENDENT_AMBULATORY_CARE_PROVIDER_SITE_OTHER): Payer: Medicare Other | Admitting: Podiatry

## 2015-12-07 VITALS — BP 143/90 | HR 73 | Resp 16

## 2015-12-07 DIAGNOSIS — B351 Tinea unguium: Secondary | ICD-10-CM | POA: Diagnosis not present

## 2015-12-07 DIAGNOSIS — M79675 Pain in left toe(s): Secondary | ICD-10-CM

## 2015-12-07 NOTE — Progress Notes (Signed)
Patient ID: [name] KinsBarbara J Flanery, female   DOB: 05/06/1940, 75 y.o.   MRN: [id number] HPI  Complaint:  Visit Type: Patient returns to my office for continued preventative foot care services. Complaint: Patient states" my nails have grown long and thick and become painful to walk and wear shoes" .She presents for preventative foot care services. No changes to ROS  Podiatric Exam: Vascular: dorsalis pedis and posterior tibial pulses are negative. Capillary return is immediate. Temperature gradient is negative. Skin turgor WNL, bilateral swelling .  Sensorium: Normal Semmes Weinstein monofilament test. Normal tactile sensation bilaterally.  Nail Exam: Pt has thick disfigured discolored nails with subungual debris noted bilateral entire nail hallux through fifth toenails Ulcer Exam: There is no evidence of ulcer or pre-ulcerative changes or infection. Orthopedic Exam: Muscle tone and strength are WNL. No limitations in general ROM. No crepitus or effusions noted. Foot type and digits show no abnormalities. Bony prominences are unremarkable. Severe HAV deformities B/l Skin: No Porokeratosis. No infection or ulcers  Diagnosis:  Tinea unguium, Pain in right toe, pain in left toes  Treatment & Plan Procedures and Treatment: Consent by patient was obtained for treatment procedures. The patient understood the discussion of treatment and procedures well. All questions were answered thoroughly reviewed. Debridement of mycotic and hypertrophic toenails, 1 through 5 bilateral and clearing of subungual debris. No ulceration, no infection noted.  Return Visit-Office Procedure: Patient instructed to return to the office for a follow up visit 3 months for continued evaluation and treatment.   Helane GuntherGregory Pearlie Lafosse DPM

## 2016-03-07 ENCOUNTER — Ambulatory Visit: Payer: Medicare Other | Admitting: Podiatry

## 2016-03-14 ENCOUNTER — Ambulatory Visit: Payer: Medicare Other | Admitting: Podiatry

## 2016-03-28 ENCOUNTER — Ambulatory Visit: Payer: Medicare Other | Admitting: Podiatry

## 2016-04-03 ENCOUNTER — Ambulatory Visit (INDEPENDENT_AMBULATORY_CARE_PROVIDER_SITE_OTHER): Payer: Medicare Other | Admitting: Podiatry

## 2016-04-03 DIAGNOSIS — M79675 Pain in left toe(s): Secondary | ICD-10-CM

## 2016-04-03 DIAGNOSIS — B351 Tinea unguium: Secondary | ICD-10-CM | POA: Diagnosis not present

## 2016-04-03 DIAGNOSIS — M79676 Pain in unspecified toe(s): Secondary | ICD-10-CM

## 2016-04-03 NOTE — Progress Notes (Signed)
Patient ID: [name] KinsBarbara J Shore, female   DOB: 11/11/1940, 76 y.o.   MRN: [id number] HPI  Complaint:  Visit Type: Patient returns to my office for continued preventative foot care services. Complaint: Patient states" my nails have grown long and thick and become painful to walk and wear shoes" .She presents for preventative foot care services. No changes to ROS  Podiatric Exam: Vascular: dorsalis pedis and posterior tibial pulses are negative. Capillary return is immediate. Temperature gradient is negative. Skin turgor WNL, bilateral swelling .  Sensorium: Normal Semmes Weinstein monofilament test. Normal tactile sensation bilaterally.  Nail Exam: Pt has thick disfigured discolored nails with subungual debris noted bilateral entire nail hallux through fifth toenails Ulcer Exam: There is no evidence of ulcer or pre-ulcerative changes or infection. Orthopedic Exam: Muscle tone and strength are WNL. No limitations in general ROM. No crepitus or effusions noted. Foot type and digits show no abnormalities. Bony prominences are unremarkable. Severe HAV deformities B/l Skin: No Porokeratosis. No infection or ulcers  Diagnosis:  Tinea unguium, Pain in right toe, pain in left toes  Treatment & Plan Procedures and Treatment: Consent by patient was obtained for treatment procedures. The patient understood the discussion of treatment and procedures well. All questions were answered thoroughly reviewed. Debridement of mycotic and hypertrophic toenails, 1 through 5 bilateral and clearing of subungual debris. No ulceration, no infection noted.  Return Visit-Office Procedure: Patient instructed to return to the office for a follow up visit 3 months for continued evaluation and treatment.   Helane GuntherGregory Onyekachi Gathright DPM

## 2016-07-17 ENCOUNTER — Encounter: Payer: Self-pay | Admitting: Podiatry

## 2016-07-17 ENCOUNTER — Ambulatory Visit (INDEPENDENT_AMBULATORY_CARE_PROVIDER_SITE_OTHER): Payer: Medicare Other | Admitting: Podiatry

## 2016-07-17 DIAGNOSIS — M79675 Pain in left toe(s): Secondary | ICD-10-CM | POA: Diagnosis not present

## 2016-07-17 DIAGNOSIS — B351 Tinea unguium: Secondary | ICD-10-CM

## 2016-07-17 NOTE — Progress Notes (Signed)
Patient ID: [name] KinsBarbara J [name], female   DOB: 01/26/1941, 76 y.o.   MRN: [id number] HPI  Complaint:  Visit Type: Patient returns to my office for continued preventative foot care services. Complaint: Patient states" my nails have grown long and thick and become painful to walk and wear shoes" .She presents for preventative foot care services. No changes to ROS  Podiatric Exam: Vascular: dorsalis pedis and posterior tibial pulses are negative. Capillary return is immediate. Temperature gradient is negative. Skin turgor WNL, bilateral swelling .  Sensorium: Normal Semmes Weinstein monofilament test. Normal tactile sensation bilaterally.  Nail Exam: Pt has thick disfigured discolored nails with subungual debris noted bilateral entire nail hallux through fifth toenails Ulcer Exam: There is no evidence of ulcer or pre-ulcerative changes or infection. Orthopedic Exam: Muscle tone and strength are WNL. No limitations in general ROM. No crepitus or effusions noted. Foot type and digits show no abnormalities. Bony prominences are unremarkable. Severe HAV deformities B/l Skin: No Porokeratosis. No infection or ulcers  Diagnosis:  Tinea unguium, Pain in right toe, pain in left toes  Treatment & Plan Procedures and Treatment: Consent by patient was obtained for treatment procedures. The patient understood the discussion of treatment and procedures well. All questions were answered thoroughly reviewed. Debridement of mycotic and hypertrophic toenails, 1 through 5 bilateral and clearing of subungual debris. No ulceration, no infection noted.  Return Visit-Office Procedure: Patient instructed to return to the office for a follow up visit 3 months for continued evaluation and treatment.   Helane GuntherGregory Chinmayi Rumer DPM

## 2016-10-18 ENCOUNTER — Ambulatory Visit: Payer: Medicare Other | Admitting: Podiatry

## 2016-11-06 ENCOUNTER — Ambulatory Visit: Payer: Medicare Other | Admitting: Podiatry

## 2016-11-29 ENCOUNTER — Ambulatory Visit (INDEPENDENT_AMBULATORY_CARE_PROVIDER_SITE_OTHER): Payer: Medicare Other | Admitting: Podiatry

## 2016-11-29 ENCOUNTER — Encounter: Payer: Self-pay | Admitting: Podiatry

## 2016-11-29 DIAGNOSIS — M79676 Pain in unspecified toe(s): Secondary | ICD-10-CM | POA: Diagnosis not present

## 2016-11-29 DIAGNOSIS — B351 Tinea unguium: Secondary | ICD-10-CM | POA: Diagnosis not present

## 2016-11-29 DIAGNOSIS — M79675 Pain in left toe(s): Secondary | ICD-10-CM

## 2016-11-29 NOTE — Progress Notes (Signed)
Patient ID: [name] KinsBarbara J Herrmann, female   DOB: 03/16/1940, 76 y.o.   MRN: [id number] HPI  Complaint:  Visit Type: Patient returns to my office for continued preventative foot care services. Complaint: Patient states" my nails have grown long and thick and become painful to walk and wear shoes" .She presents for preventative foot care services. No changes to ROS  Podiatric Exam: Vascular: dorsalis pedis and posterior tibial pulses are negative. Capillary return is immediate. Temperature gradient is negative. Skin turgor WNL, bilateral swelling .  Sensorium: Normal Semmes Weinstein monofilament test. Normal tactile sensation bilaterally.  Nail Exam: Pt has thick disfigured discolored nails with subungual debris noted bilateral entire nail hallux through fifth toenails Ulcer Exam: There is no evidence of ulcer or pre-ulcerative changes or infection. Orthopedic Exam: Muscle tone and strength are WNL. No limitations in general ROM. No crepitus or effusions noted. Foot type and digits show no abnormalities. Bony prominences are unremarkable. Severe HAV deformities B/l Skin: No Porokeratosis. No infection or ulcers  Diagnosis:  Tinea unguium, Pain in right toe, pain in left toes  Treatment & Plan Procedures and Treatment: Consent by patient was obtained for treatment procedures. The patient understood the discussion of treatment and procedures well. All questions were answered thoroughly reviewed. Debridement of mycotic and hypertrophic toenails, 1 through 5 bilateral and clearing of subungual debris. No ulceration, no infection noted.  Return Visit-Office Procedure: Patient instructed to return to the office for a follow up visit 3 months for continued evaluation and treatment.   Helane GuntherGregory Kyrie Fludd DPM

## 2017-02-28 ENCOUNTER — Encounter: Payer: Self-pay | Admitting: Podiatry

## 2017-02-28 ENCOUNTER — Ambulatory Visit (INDEPENDENT_AMBULATORY_CARE_PROVIDER_SITE_OTHER): Payer: Medicare Other | Admitting: Podiatry

## 2017-02-28 DIAGNOSIS — B351 Tinea unguium: Secondary | ICD-10-CM

## 2017-02-28 DIAGNOSIS — M79675 Pain in left toe(s): Secondary | ICD-10-CM | POA: Diagnosis not present

## 2017-02-28 NOTE — Progress Notes (Signed)
Patient ID: [name] KinsBarbara J Endsley, female   DOB: 03/11/1940, 77 y.o.   MRN: [id number] HPI  Complaint:  Visit Type: Patient returns to my office for continued preventative foot care services. Complaint: Patient states" my nails have grown long and thick and become painful to walk and wear shoes" .She presents for preventative foot care services. No changes to ROS  Podiatric Exam: Vascular: dorsalis pedis and posterior tibial pulses are negative. Capillary return is immediate. Temperature gradient is negative. Skin turgor WNL, bilateral swelling .  Sensorium: Normal Semmes Weinstein monofilament test. Normal tactile sensation bilaterally.  Nail Exam: Pt has thick disfigured discolored nails with subungual debris noted bilateral entire nail hallux through fifth toenails Ulcer Exam: There is no evidence of ulcer or pre-ulcerative changes or infection. Orthopedic Exam: Muscle tone and strength are WNL. No limitations in general ROM. No crepitus or effusions noted. Foot type and digits show no abnormalities. Bony prominences are unremarkable. Severe HAV deformities B/l Skin: No Porokeratosis. No infection or ulcers  Diagnosis:  Tinea unguium, Pain in right toe, pain in left toes  Treatment & Plan Procedures and Treatment: Consent by patient was obtained for treatment procedures. The patient understood the discussion of treatment and procedures well. All questions were answered thoroughly reviewed. Debridement of mycotic and hypertrophic toenails, 1 through 5 bilateral and clearing of subungual debris. No ulceration, no infection noted.  Return Visit-Office Procedure: Patient instructed to return to the office for a follow up visit 3 months for continued evaluation and treatment.   Helane GuntherGregory Casha Estupinan DPM

## 2017-04-20 ENCOUNTER — Encounter (HOSPITAL_BASED_OUTPATIENT_CLINIC_OR_DEPARTMENT_OTHER): Payer: Self-pay | Admitting: Adult Health

## 2017-04-20 ENCOUNTER — Other Ambulatory Visit: Payer: Self-pay

## 2017-04-20 ENCOUNTER — Emergency Department (HOSPITAL_BASED_OUTPATIENT_CLINIC_OR_DEPARTMENT_OTHER)
Admission: EM | Admit: 2017-04-20 | Discharge: 2017-04-21 | Disposition: A | Discharge status: Home / Self Care | Payer: Medicare Other | Attending: Emergency Medicine | Admitting: Emergency Medicine

## 2017-04-20 DIAGNOSIS — R1110 Vomiting, unspecified: Secondary | ICD-10-CM | POA: Diagnosis present

## 2017-04-20 DIAGNOSIS — N3001 Acute cystitis with hematuria: Secondary | ICD-10-CM

## 2017-04-20 DIAGNOSIS — I5032 Chronic diastolic (congestive) heart failure: Secondary | ICD-10-CM | POA: Diagnosis not present

## 2017-04-20 DIAGNOSIS — Z8673 Personal history of transient ischemic attack (TIA), and cerebral infarction without residual deficits: Secondary | ICD-10-CM | POA: Diagnosis not present

## 2017-04-20 DIAGNOSIS — Z7982 Long term (current) use of aspirin: Secondary | ICD-10-CM | POA: Diagnosis not present

## 2017-04-20 DIAGNOSIS — G20 Parkinson's disease: Secondary | ICD-10-CM | POA: Diagnosis not present

## 2017-04-20 DIAGNOSIS — Z96651 Presence of right artificial knee joint: Secondary | ICD-10-CM | POA: Diagnosis not present

## 2017-04-20 DIAGNOSIS — Z79899 Other long term (current) drug therapy: Secondary | ICD-10-CM | POA: Diagnosis not present

## 2017-04-20 DIAGNOSIS — F419 Anxiety disorder, unspecified: Secondary | ICD-10-CM | POA: Diagnosis not present

## 2017-04-20 DIAGNOSIS — I2510 Atherosclerotic heart disease of native coronary artery without angina pectoris: Secondary | ICD-10-CM | POA: Diagnosis not present

## 2017-04-20 DIAGNOSIS — I110 Hypertensive heart disease with heart failure: Secondary | ICD-10-CM | POA: Insufficient documentation

## 2017-04-20 DIAGNOSIS — F0390 Unspecified dementia without behavioral disturbance: Secondary | ICD-10-CM | POA: Insufficient documentation

## 2017-04-20 LAB — CBC WITH DIFFERENTIAL/PLATELET
Basophils Absolute: 0 10*3/uL (ref 0.0–0.1)
Basophils Relative: 0 %
Eosinophils Absolute: 0.2 10*3/uL (ref 0.0–0.7)
Eosinophils Relative: 1 %
HCT: 43.5 % (ref 36.0–46.0)
Hemoglobin: 13.9 g/dL (ref 12.0–15.0)
Lymphocytes Relative: 10 %
Lymphs Abs: 1.4 10*3/uL (ref 0.7–4.0)
MCH: 26.2 pg (ref 26.0–34.0)
MCHC: 32 g/dL (ref 30.0–36.0)
MCV: 82.1 fL (ref 78.0–100.0)
Monocytes Absolute: 0.6 10*3/uL (ref 0.1–1.0)
Monocytes Relative: 5 %
Neutro Abs: 11.4 10*3/uL — ABNORMAL HIGH (ref 1.7–7.7)
Neutrophils Relative %: 84 %
Platelets: 299 10*3/uL (ref 150–400)
RBC: 5.3 MIL/uL — ABNORMAL HIGH (ref 3.87–5.11)
RDW: 16 % — ABNORMAL HIGH (ref 11.5–15.5)
WBC: 13.7 10*3/uL — ABNORMAL HIGH (ref 4.0–10.5)

## 2017-04-20 LAB — COMPREHENSIVE METABOLIC PANEL
ALT: 10 U/L — ABNORMAL LOW (ref 14–54)
AST: 17 U/L (ref 15–41)
Albumin: 4.3 g/dL (ref 3.5–5.0)
Alkaline Phosphatase: 91 U/L (ref 38–126)
Anion gap: 12 (ref 5–15)
BUN: 21 mg/dL — ABNORMAL HIGH (ref 6–20)
CO2: 21 mmol/L — ABNORMAL LOW (ref 22–32)
Calcium: 9.8 mg/dL (ref 8.9–10.3)
Chloride: 108 mmol/L (ref 101–111)
Creatinine, Ser: 0.97 mg/dL (ref 0.44–1.00)
GFR calc Af Amer: >60 mL/min (ref >60)
GFR calc non Af Amer: 54 mL/min — ABNORMAL LOW (ref >60)
Glucose, Bld: 130 mg/dL — ABNORMAL HIGH (ref 65–99)
Potassium: 4.1 mmol/L (ref 3.5–5.1)
Sodium: 141 mmol/L (ref 135–145)
Total Bilirubin: 0.7 mg/dL (ref 0.3–1.2)
Total Protein: 7.8 g/dL (ref 6.5–8.1)

## 2017-04-20 LAB — LIPASE, BLOOD: Lipase: 34 U/L (ref 11–51)

## 2017-04-20 LAB — URINALYSIS, MICROSCOPIC (REFLEX)

## 2017-04-20 LAB — URINALYSIS, ROUTINE W REFLEX MICROSCOPIC
Glucose, UA: NEGATIVE mg/dL
Ketones, ur: 15 mg/dL — AB
Nitrite: NEGATIVE
Protein, ur: 100 mg/dL — AB
Specific Gravity, Urine: >1.03 — ABNORMAL HIGH (ref 1.005–1.030)
pH: 6 (ref 5.0–8.0)

## 2017-04-20 MED ORDER — CEPHALEXIN 250 MG PO CAPS
500.0000 mg | ORAL_CAPSULE | Freq: Once | ORAL | Status: DC
  Filled 2017-04-20: qty 2

## 2017-04-20 MED ORDER — CEPHALEXIN 250 MG PO CAPS: 250.0000 mg | ORAL_CAPSULE | Freq: Once | ORAL | Status: DC

## 2017-04-20 MED ORDER — PANTOPRAZOLE SODIUM 40 MG IV SOLR
40.0000 mg | Freq: Once | INTRAVENOUS | Status: AC
  Administered 2017-04-20: 40 mg via INTRAVENOUS
  Filled 2017-04-20: qty 40

## 2017-04-20 NOTE — ED Notes (Signed)
Alert, NAD, calm, interactive, resps e/u, speaking in clearly, no dyspnea noted, skin W&D, VSS, (denies: pain, sob, nausea, dizziness).

## 2017-04-20 NOTE — ED Triage Notes (Addendum)
[facility] brown emesis that smells of feces per NH staff, She alsocomplains of generalized weakness ongoing since Friday. Last BM yesterday, small soft stool.  Pt states she feels nauseated and is having pain all over her abdomen. No medication given prior to transfer.  Abdomen is distended. Pt is normally alert, disoriented to person and place and time, with HX of Alzheiimers and Parkinsons.  Daughter at bedside states that her mother has not eaten in 2 days.

## 2017-04-20 NOTE — ED Provider Notes (Signed)
[facility] EMERGENCY DEPARTMENT Provider Note  CSN: [id number] Arrival date & time: 04/20/17 1830  Chief Complaint(s) Emesis  HPI [name] is a 77 y.o. female with an extensive past medical history listed below including atrial fibrillation, prior CVAs, Alzheimer's dementia who presents to the emergency department with one episode of what appeared to be coffee-ground emesis at the skilled nursing facility.  He denies any recent fevers or infections.  He denies any trauma.  Patient has not had any emesis since.  No diarrhea.  Patient had not been complaining of any chest pain, shortness of breath, abdominal pain.  Remainder of history, ROS, and physical exam limited due to patient's condition (dementia). Additional information was obtained from family.   Level V Caveat.    HPI  Past Medical History Past Medical History:  Diagnosis Date  . Anxiety disorder   . Atrial fibrillation (HCC)    a. new 04/2011;  b. Pradaxa  . Chronic diastolic heart failure (HCC)    Echocardiogram 04/14/11: Moderate LVH, EF 60-65%, mild LAE.  Marland Kitchen. 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 Gastritis   . H/O: hysterectomy   . Heart murmur    from age 77 associated with some hemoptysis  . Hypercholesteremia   . Hypertension   . Impaired glucose tolerance   . Obesity   . Osteoarthritis   . Parkinson's disease (HCC)    patient denies having parkinsons  . Stroke [facility](HCC) 2007   Residual R sided weakness, uses a wheelchair, walker at home   Patient Active Problem List   Diagnosis Date Noted  . Lower extremity weakness 09/03/2013  . Ambulatory dysfunction 09/03/2013  . Parkinson disease (HCC) 09/03/2013  . SVT (supraventricular tachycardia) (HCC) 03/23/2012  . Preop cardiovascular exam 07/03/2011  . Chest pain 04/26/2011  . Atrial fibrillation (HCC)   . Diastolic CHF (HCC)   . EDEMA 11/02/2008  .  IMPAIRED GLUCOSE TOLERANCE 10/15/2008  . HYPERCHOLESTEROLEMIA 10/15/2008  . OBESITY 10/15/2008  . ANXIETY DISORDER 10/15/2008  . HYPERTENSION, UNSPECIFIED 10/15/2008  . Coronary atherosclerosis 10/15/2008  . GASTRITIS 10/15/2008  . OSTEOARTHRITIS 10/15/2008   Home Medication(s) Prior to Admission medications   Medication Sig Start Date End Date Taking? Authorizing Provider  amLODipine-benazepril (LOTREL) 5-20 MG per capsule Take 1 capsule by mouth daily.    [provider]  aspirin EC 81 MG tablet Take 1 tablet (81 mg total) by mouth daily. 09/04/13   Ghimire, Werner LeanShanker M, MD  cefTRIAXone (ROCEPHIN) 1 g injection  02/18/15   [provider]  cephALEXin (KEFLEX) 250 MG/5ML suspension Take 10 mLs (500 mg total) by mouth 3 (three) times daily for 7 days. 04/21/17 04/28/17  Nira Connardama, Christy Ehrsam Eduardo, MD  citalopram (CELEXA) 20 MG tablet Take 20 mg by mouth daily.    [provider]  donepezil (ARICEPT) 10 MG tablet Take 10 mg by mouth daily. 07/30/13   [provider]  esomeprazole (NEXIUM) 20 MG capsule Take 20 mg by mouth daily at 12 noon.    [provider]  ipratropium-albuterol (DUONEB) 0.5-2.5 (3) MG/3ML SOLN  02/22/15   [provider]  lidocaine (XYLOCAINE) 1 % (with preservative) injection  02/18/15   [provider]  Melatonin 3 MG CAPS Take by mouth at bedtime.    [provider]  metoprolol (LOPRESSOR) 50 MG tablet Take 50 mg by mouth 2 (two) times daily.    [provider]  MYRBETRIQ 25 MG TB24 tablet  02/17/15   [provider]  polyethylene glycol (MIRALAX) packet Take 17 g by mouth daily. 09/02/13   Horton, Mayer Masker, MD  Promethazine HCl (PHENERGAN PO) Take 25 mg by mouth.    [provider]  QUEtiapine (SEROQUEL) 25 MG tablet 1-2 tabs qhs 04/01/14   Levert Feinstein, MD  traMADol (ULTRAM) 50 MG tablet Take by mouth every 6 (six) hours as needed.    [provider]                                                                                                                                     Past Surgical History Past Surgical History:  Procedure Laterality Date  . CARPAL TUNNEL RELEASE     right  . 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 LEFT HEART CATHETERIZATION WITH CORONARY ANGIOGRAM N/A 05/03/2011   Procedure: LEFT HEART CATHETERIZATION WITH CORONARY ANGIOGRAM;  Surgeon: Herby Abraham, MD;  Location: [facility] CATH LAB;  Service: Cardiovascular;  Laterality: N/A;  . REPLACEMENT TOTAL KNEE     right  . TONSILLECTOMY     Family History Family History  Problem Relation Age of Onset  . Cancer Mother 89       died  . Heart attack Father 13       died    Social History Social History   Tobacco Use  . Smoking status: Never Smoker  . Smokeless tobacco: Never Used  Substance Use Topics  . Alcohol use: No  . Drug use: No   Allergies Sulfonamide derivatives  Review of Systems Review of Systems  Unable to perform ROS: Dementia    Physical Exam Vital Signs  I have reviewed the triage vital signs BP 135/89 (BP Location: Right Arm)   Pulse 78   Temp 97.8 F (36.6 C) (Oral)   Resp 18   SpO2 94%   Physical Exam  Constitutional: She is oriented to person, place, and time. She appears well-developed and well-nourished. No distress.  HENT:  Head: Normocephalic and atraumatic.  Nose: Nose normal.  Eyes: Conjunctivae and EOM are normal. Pupils are equal, round, and reactive to light. Right eye exhibits no discharge. Left eye exhibits no discharge. No scleral icterus.  Missing left eye  Neck: Normal range of motion. Neck supple.  Cardiovascular: Normal rate and regular rhythm. Exam reveals no gallop and no friction rub.  No murmur heard. Pulmonary/Chest: Effort normal and breath sounds normal. No stridor. No respiratory distress. She has no rales.  Abdominal: Soft. She exhibits no distension. There is no tenderness. There is no rigidity, no  rebound and no guarding.  Musculoskeletal: She exhibits no edema or tenderness.  Neurological: She is alert and oriented to person, place, and time.  Skin: Skin is warm and dry. No rash noted. She is not diaphoretic. No  erythema.  Psychiatric: She has a normal mood and affect.  Vitals reviewed.   ED Results and Treatments Labs (all labs ordered are listed, but only abnormal results are displayed) Labs Reviewed  CBC WITH DIFFERENTIAL/PLATELET - Abnormal; Notable for the following components:      Result Value   WBC 13.7 (*)    RBC 5.30 (*)    RDW 16.0 (*)    Neutro Abs 11.4 (*)    All other components within normal limits  COMPREHENSIVE METABOLIC PANEL - Abnormal; Notable for the following components:   CO2 21 (*)    Glucose, Bld 130 (*)    BUN 21 (*)    ALT 10 (*)    GFR calc non Af Amer 54 (*)    All other components within normal limits  URINALYSIS, ROUTINE W REFLEX MICROSCOPIC - Abnormal; Notable for the following components:   APPearance TURBID (*)    Specific Gravity, Urine >1.030 (*)    Hgb urine dipstick MODERATE (*)    Bilirubin Urine MODERATE (*)    Ketones, ur 15 (*)    Protein, ur 100 (*)    Leukocytes, UA SMALL (*)    All other components within normal limits  URINALYSIS, MICROSCOPIC (REFLEX) - Abnormal; Notable for the following components:   Bacteria, UA MANY (*)    Squamous Epithelial / LPF 0-5 (*)    All other components within normal limits  URINE CULTURE  LIPASE, BLOOD                                                                                                                         EKG  EKG Interpretation  Date/Time:  Sunday April 20 2017 19:36:42 EDT Ventricular Rate:  78 PR Interval:    QRS Duration: 136 QT Interval:  410 QTC Calculation: 467 R Axis:   -39 Text Interpretation:  Sinus rhythm Right bundle branch block No significant change since last tracing Confirmed by Drema Pry [id number]) on 04/20/2017 7:39:30 PM      Radiology No  results found. Pertinent labs & imaging results that were available during my care of the patient were reviewed by me and considered in my medical decision making (see chart for details).  Medications Ordered in ED Medications  cefTRIAXone (ROCEPHIN) 1 g in sodium chloride 0.9 % 100 mL IVPB (1 g Intravenous New Bag/Given 04/21/17 0018)  pantoprazole (PROTONIX) injection 40 mg (40 mg Intravenous Given 04/20/17 2310)  Procedures Procedures  (including critical care time)  Medical Decision Making / ED Course I have reviewed the nursing notes for this encounter and the patient's prior records (if available in EHR or on provided paperwork).    One episode of was reported to be coffee-ground emesis.  No repeated episodes.  Patient is not anticoagulated.  No prior history of ulcers.  Abdomen benign.  Labs with  Leukocytosis, no significant electrolyte derangements or renal insufficiency.  No evidence of biliary obstruction or pancreatitis.  UA suspicious for a urinary tract infection.  Culture sent.  Will treat empirically patient has been able to tolerate oral hydration.   The patient appears reasonably screened and/or stabilized for discharge and I doubt any other medical condition or other [facility] requiring further screening, evaluation, or treatment in the ED at this time prior to discharge.  The patient is safe for discharge with strict return precautions.   Final Clinical Impression(s) / ED Diagnoses Final diagnoses:  Acute cystitis with hematuria  Non-intractable vomiting, presence of nausea not specified, unspecified vomiting type   Disposition: Discharge  Condition: Good  I have discussed the results, Dx and Tx plan with the patient's family who expressed understanding and agree(s) with the plan. Discharge instructions discussed at great length. The patient's  family was given strict return precautions who verbalized understanding of the instructions. No further questions at time of discharge.    ED Discharge Orders        Ordered    cephALEXin (KEFLEX) 250 MG/5ML suspension  3 times daily     04/21/17 0030       Follow Up: Nadara Eaton, MD [address] [place] [address] [phone]  Schedule an appointment as soon as possible for a visit  As needed      This chart was dictated using voice recognition software.  Despite best efforts to proofread,  errors can occur which can change the documentation meaning.   Nira Conn, MD 04/21/17 0030

## 2017-04-21 MED ORDER — CEPHALEXIN 250 MG/5ML PO SUSR: 500.0000 mg | Freq: Three times a day (TID) | ORAL | 0 refills | Status: AC

## 2017-04-21 MED ORDER — SODIUM CHLORIDE 0.9 % IV SOLN
1.0000 g | Freq: Once | INTRAVENOUS | Status: AC
  Administered 2017-04-21: 1 g via INTRAVENOUS
  Filled 2017-04-21: qty 10

## 2017-04-21 NOTE — ED Notes (Signed)
Contacted [facility] for transport for patient back to their home

## 2017-04-21 NOTE — ED Notes (Signed)
[name]

## 2017-04-21 NOTE — ED Notes (Signed)
No changes, resting comfortably, family x2 a tBS, EDP in to update on results and plan.

## 2017-04-24 LAB — URINE CULTURE: Culture: >=100000 — AB

## 2017-04-25 ENCOUNTER — Telehealth: Payer: Self-pay | Admitting: *Deleted

## 2017-04-25 NOTE — Telephone Encounter (Signed)
Post ED Visit - Positive Culture Follow-up  Culture report reviewed by antimicrobial stewardship pharmacist:  []  Enzo BiNathan Batchelder, Pharm.D. []  Celedonio MiyamotoJeremy Frens, Pharm.D., BCPS AQ-ID []  Garvin FilaMike Maccia, Pharm.D., BCPS [x]  Georgina PillionElizabeth Martin, Pharm.D., BCPS []  ManchesterMinh Pham, [address]Pharm.D., BCPS, AAHIVP []  Estella HuskMichelle Turner, Pharm.D., BCPS, AAHIVP []  Lysle Pearlachel Rumbarger, PharmD, BCPS []  Blake DivineShannon Parkey, PharmD []  Pollyann SamplesAndy Johnston, PharmD, BCPS  Positive urine culture Treated with Cephalexin, organism sensitive to the same and no further patient follow-up is required at this time.  Virl AxeRobertson, Marcelina [facility] 04/25/2017, 10:44 AM

## 2017-04-25 NOTE — Progress Notes (Signed)
ED Antimicrobial Stewardship Positive Culture Follow Up   [name] is an 77 y.o. female who [facility] with a chief complaint of  Chief Complaint  Patient presents with  . Emesis    Recent Results (from the past 720 hour(s))  Urine culture     Status: Abnormal   Collection Time: 04/20/17 11:13 PM  Result Value Ref Range Status   Specimen Description   Final    URINE, CLEAN CATCH Performed at [facility], [address] [place], [place] [address]    Special Requests   Final    NONE Performed at [facility], [address] [place], [place] [address]    Culture (A)  Final    >=100,000 COLONIES/mL PSEUDOMONAS AERUGINOSA 50,000 COLONIES/mL ESCHERICHIA COLI    Report Status 04/24/2017 FINAL  Final   Organism ID, Bacteria PSEUDOMONAS AERUGINOSA (A)  Final   Organism ID, Bacteria ESCHERICHIA COLI (A)  Final      Susceptibility   Escherichia coli - MIC*    AMPICILLIN 8 SENSITIVE Sensitive     CEFAZOLIN <=4 SENSITIVE Sensitive     CEFTRIAXONE <=1 SENSITIVE Sensitive     CIPROFLOXACIN <=0.25 SENSITIVE Sensitive     GENTAMICIN <=1 SENSITIVE Sensitive     IMIPENEM <=0.25 SENSITIVE Sensitive     NITROFURANTOIN <=16 SENSITIVE Sensitive     TRIMETH/SULFA <=20 SENSITIVE Sensitive     AMPICILLIN/SULBACTAM 4 SENSITIVE Sensitive     PIP/TAZO <=4 SENSITIVE Sensitive     Extended ESBL NEGATIVE Sensitive     * 50,000 COLONIES/mL ESCHERICHIA COLI   Pseudomonas aeruginosa - MIC*    CEFTAZIDIME 4 SENSITIVE Sensitive     CIPROFLOXACIN <=0.25 SENSITIVE Sensitive     GENTAMICIN <=1 SENSITIVE Sensitive     IMIPENEM 2 SENSITIVE Sensitive     PIP/TAZO 8 SENSITIVE Sensitive     CEFEPIME 2 SENSITIVE Sensitive     * >=100,000 COLONIES/mL PSEUDOMONAS AERUGINOSA    [x]  Treated with Keflex, one organism sensitive and one organism resistant to prescribed antimicrobial  Needs additional follow-up: - Call for symptom check and repeat UA/urine  culture if still symptomatic - Consider Keflex as prescribed - no additional treatment needed at this time.   ED Provider: Lyndel SafeElizabeth Hammond, PA-C  Rolley SimsMartin, Trica Usery Ann 04/25/2017, 8:37 AM Infectious Diseases Pharmacist Phone# [phone]

## 2017-05-23 ENCOUNTER — Ambulatory Visit: Payer: Medicare Other | Admitting: Podiatry

## 2017-06-04 ENCOUNTER — Ambulatory Visit: Payer: Medicare Other | Admitting: Podiatry

## 2017-06-10 ENCOUNTER — Ambulatory Visit: Payer: Medicare Other | Admitting: Podiatry

## 2017-08-12 ENCOUNTER — Ambulatory Visit (INDEPENDENT_AMBULATORY_CARE_PROVIDER_SITE_OTHER): Payer: Medicare Other | Admitting: Podiatry

## 2017-08-12 ENCOUNTER — Encounter: Payer: Self-pay | Admitting: Podiatry

## 2017-08-12 DIAGNOSIS — M79676 Pain in unspecified toe(s): Secondary | ICD-10-CM | POA: Diagnosis not present

## 2017-08-12 DIAGNOSIS — B351 Tinea unguium: Secondary | ICD-10-CM

## 2017-08-12 DIAGNOSIS — M79675 Pain in left toe(s): Secondary | ICD-10-CM

## 2017-08-12 NOTE — Progress Notes (Signed)
Patient ID: Miki KinsBarbara J Blasdell, female   DOB: 11/23/1940, 77 y.o.   MRN: [id number] HPI  Complaint:  Visit Type: Patient returns to my office for continued preventative foot care services. Complaint: Patient states" my nails have grown long and thick and become painful to walk and wear shoes" .She presents for preventative foot care services. No changes to ROS.  Patient has not been seen over 6 months.  Podiatric Exam: Vascular: dorsalis pedis and posterior tibial pulses are negative. Capillary return is immediate. Temperature gradient is negative. Skin turgor WNL, bilateral swelling .  Sensorium: Normal Semmes Weinstein monofilament test. Normal tactile sensation bilaterally.  Nail Exam: Pt has thick disfigured discolored nails with subungual debris noted bilateral entire nail hallux through fifth toenails Ulcer Exam: There is no evidence of ulcer or pre-ulcerative changes or infection. Orthopedic Exam: Muscle tone and strength are WNL. No limitations in general ROM. No crepitus or effusions noted. Foot type and digits show no abnormalities. Bony prominences are unremarkable. Severe HAV deformities B/l Skin: No Porokeratosis. No infection or ulcers  Diagnosis:  Tinea unguium, Pain in right toe, pain in left toes  Treatment & Plan Procedures and Treatment: Consent by patient was obtained for treatment procedures. The patient understood the discussion of treatment and procedures well. All questions were answered thoroughly reviewed. Debridement of mycotic and hypertrophic toenails, 1 through 5 bilateral and clearing of subungual debris. No ulceration, no infection noted.  Return Visit-Office Procedure: Patient instructed to return to the office for a follow up visit 3 months for continued evaluation and treatment.   Helane GuntherGregory Haeven Nickle DPM

## 2017-11-12 ENCOUNTER — Ambulatory Visit: Payer: Medicare Other | Admitting: Podiatry

## 2017-12-11 ENCOUNTER — Ambulatory Visit: Payer: Medicare Other | Admitting: Podiatry

## 2017-12-26 ENCOUNTER — Encounter: Payer: Self-pay | Admitting: Podiatry

## 2017-12-26 ENCOUNTER — Ambulatory Visit (INDEPENDENT_AMBULATORY_CARE_PROVIDER_SITE_OTHER): Payer: Medicare Other | Admitting: Podiatry

## 2017-12-26 DIAGNOSIS — B351 Tinea unguium: Secondary | ICD-10-CM

## 2017-12-26 DIAGNOSIS — M79675 Pain in left toe(s): Secondary | ICD-10-CM

## 2018-01-17 NOTE — Progress Notes (Signed)
Subjective: [name] presents today with painful, thick toenails 1-5 b/l that she cannot cut and which interfere with daily activities.  Pain is aggravated when wearing enclosed shoe gear.   Current Outpatient Medications:  .  amLODipine-benazepril (LOTREL) 5-20 MG per capsule, Take 1 capsule by mouth daily., Disp: , Rfl:  .  aspirin EC 81 MG tablet, Take 1 tablet (81 mg total) by mouth daily., Disp: , Rfl:  .  cefTRIAXone (ROCEPHIN) 1 g injection, , Disp: , Rfl:  .  citalopram (CELEXA) 20 MG tablet, Take 20 mg by mouth daily., Disp: , Rfl:  .  donepezil (ARICEPT) 10 MG tablet, Take 10 mg by mouth daily., Disp: , Rfl:  .  esomeprazole (NEXIUM) 20 MG capsule, Take 20 mg by mouth daily at 12 noon., Disp: , Rfl:  .  ipratropium-albuterol (DUONEB) 0.5-2.5 (3) MG/3ML SOLN, , Disp: , Rfl:  .  lidocaine (XYLOCAINE) 1 % (with preservative) injection, , Disp: , Rfl:  .  Melatonin 3 MG CAPS, Take by mouth at bedtime., Disp: , Rfl:  .  metoprolol (LOPRESSOR) 50 MG tablet, Take 50 mg by mouth 2 (two) times daily., Disp: , Rfl:  .  MYRBETRIQ 25 MG TB24 tablet, , Disp: , Rfl:  .  polyethylene glycol (MIRALAX) packet, Take 17 g by mouth daily., Disp: 14 each, Rfl: 0 .  Promethazine HCl (PHENERGAN PO), Take 25 mg by mouth., Disp: , Rfl:  .  QUEtiapine (SEROQUEL) 25 MG tablet, 1-2 tabs qhs, Disp: , Rfl:  .  traMADol (ULTRAM) 50 MG tablet, Take by mouth every 6 (six) hours as needed., Disp: , Rfl:   Allergies  Allergen Reactions  . Sulfonamide Derivatives Other (See Comments)    Unknown. Pt doesn't remember.    Objective:  Vascular Examination: Capillary refill time immediate x 10 digits Dorsalis pedis and Posterior tibial pulses absent  b/l Digital hair absent x 10 digits Skin temperature gradient warm to cool b/l Edema BLE  Dermatological Examination: Skin with normal texture b/l  Venous stasis skin discoloration noted  Toenails 1-5 b/l discolored, thick, dystrophic with subungual  debris and pain with palpation to nailbeds due to thickness of nails.  Musculoskeletal: Lower extremity muscle atrophy BLE HAV with bunion deformity BLE  Neurological: Sensation intact with 10 gram monofilament.   Assessment: Painful onychomycosis toenails 1-5 b/l   Plan: 1. Toenails 1-5 b/l were debrided in length and girth without iatrogenic bleeding. 2. Patient to continue soft, supportive shoe gear 3. Patient to report any pedal injuries to medical professional immediately. 4. Follow up 3 months. Patient/POA to call should there be a concern in the interim.

## 2018-01-27 ENCOUNTER — Emergency Department (HOSPITAL_COMMUNITY): Payer: Medicare Other

## 2018-01-27 ENCOUNTER — Observation Stay (HOSPITAL_COMMUNITY): Payer: Medicare Other

## 2018-01-27 ENCOUNTER — Encounter (HOSPITAL_COMMUNITY): Payer: Self-pay

## 2018-01-27 ENCOUNTER — Other Ambulatory Visit: Payer: Self-pay

## 2018-01-27 ENCOUNTER — Inpatient Hospital Stay (HOSPITAL_COMMUNITY)
Admission: EM | Admit: 2018-01-27 | Discharge: 2018-02-13 | DRG: 388 | Disposition: A | Discharge status: Skilled Nursing Facility | Payer: Medicare Other | Source: Skilled Nursing Facility | Attending: Internal Medicine | Admitting: Internal Medicine

## 2018-01-27 DIAGNOSIS — F0280 Dementia in other diseases classified elsewhere without behavioral disturbance: Secondary | ICD-10-CM | POA: Diagnosis present

## 2018-01-27 DIAGNOSIS — I248 Other forms of acute ischemic heart disease: Secondary | ICD-10-CM | POA: Diagnosis present

## 2018-01-27 DIAGNOSIS — R17 Unspecified jaundice: Secondary | ICD-10-CM | POA: Diagnosis present

## 2018-01-27 DIAGNOSIS — Z452 Encounter for adjustment and management of vascular access device: Secondary | ICD-10-CM

## 2018-01-27 DIAGNOSIS — I10 Essential (primary) hypertension: Secondary | ICD-10-CM

## 2018-01-27 DIAGNOSIS — I480 Paroxysmal atrial fibrillation: Secondary | ICD-10-CM

## 2018-01-27 DIAGNOSIS — N179 Acute kidney failure, unspecified: Secondary | ICD-10-CM | POA: Diagnosis not present

## 2018-01-27 DIAGNOSIS — E785 Hyperlipidemia, unspecified: Secondary | ICD-10-CM | POA: Diagnosis present

## 2018-01-27 DIAGNOSIS — G20 Parkinson's disease: Secondary | ICD-10-CM | POA: Diagnosis present

## 2018-01-27 DIAGNOSIS — L89152 Pressure ulcer of sacral region, stage 2: Secondary | ICD-10-CM | POA: Diagnosis present

## 2018-01-27 DIAGNOSIS — R000 Tachycardia, unspecified: Secondary | ICD-10-CM

## 2018-01-27 DIAGNOSIS — I5030 Unspecified diastolic (congestive) heart failure: Secondary | ICD-10-CM

## 2018-01-27 DIAGNOSIS — K567 Ileus, unspecified: Secondary | ICD-10-CM | POA: Diagnosis present

## 2018-01-27 DIAGNOSIS — Z789 Other specified health status: Secondary | ICD-10-CM

## 2018-01-27 DIAGNOSIS — R140 Abdominal distension (gaseous): Secondary | ICD-10-CM

## 2018-01-27 DIAGNOSIS — I4891 Unspecified atrial fibrillation: Secondary | ICD-10-CM | POA: Diagnosis not present

## 2018-01-27 DIAGNOSIS — E860 Dehydration: Secondary | ICD-10-CM | POA: Diagnosis present

## 2018-01-27 DIAGNOSIS — Z79899 Other long term (current) drug therapy: Secondary | ICD-10-CM

## 2018-01-27 DIAGNOSIS — N182 Chronic kidney disease, stage 2 (mild): Secondary | ICD-10-CM | POA: Diagnosis present

## 2018-01-27 DIAGNOSIS — R011 Cardiac murmur, unspecified: Secondary | ICD-10-CM | POA: Diagnosis present

## 2018-01-27 DIAGNOSIS — K5641 Fecal impaction: Principal | ICD-10-CM | POA: Diagnosis present

## 2018-01-27 DIAGNOSIS — I471 Supraventricular tachycardia: Secondary | ICD-10-CM | POA: Diagnosis present

## 2018-01-27 DIAGNOSIS — I69351 Hemiplegia and hemiparesis following cerebral infarction affecting right dominant side: Secondary | ICD-10-CM

## 2018-01-27 DIAGNOSIS — M1990 Unspecified osteoarthritis, unspecified site: Secondary | ICD-10-CM | POA: Diagnosis present

## 2018-01-27 DIAGNOSIS — D62 Acute posthemorrhagic anemia: Secondary | ICD-10-CM | POA: Diagnosis present

## 2018-01-27 DIAGNOSIS — R198 Other specified symptoms and signs involving the digestive system and abdomen: Secondary | ICD-10-CM

## 2018-01-27 DIAGNOSIS — J9601 Acute respiratory failure with hypoxia: Secondary | ICD-10-CM

## 2018-01-27 DIAGNOSIS — Z7982 Long term (current) use of aspirin: Secondary | ICD-10-CM

## 2018-01-27 DIAGNOSIS — F0281 Dementia in other diseases classified elsewhere with behavioral disturbance: Secondary | ICD-10-CM

## 2018-01-27 DIAGNOSIS — Z66 Do not resuscitate: Secondary | ICD-10-CM | POA: Diagnosis present

## 2018-01-27 DIAGNOSIS — Z8249 Family history of ischemic heart disease and other diseases of the circulatory system: Secondary | ICD-10-CM

## 2018-01-27 DIAGNOSIS — E876 Hypokalemia: Secondary | ICD-10-CM | POA: Diagnosis present

## 2018-01-27 DIAGNOSIS — Z90710 Acquired absence of both cervix and uterus: Secondary | ICD-10-CM

## 2018-01-27 DIAGNOSIS — H5462 Unqualified visual loss, left eye, normal vision right eye: Secondary | ICD-10-CM | POA: Diagnosis present

## 2018-01-27 DIAGNOSIS — Z96651 Presence of right artificial knee joint: Secondary | ICD-10-CM | POA: Diagnosis present

## 2018-01-27 DIAGNOSIS — D72829 Elevated white blood cell count, unspecified: Secondary | ICD-10-CM

## 2018-01-27 DIAGNOSIS — J189 Pneumonia, unspecified organism: Secondary | ICD-10-CM

## 2018-01-27 DIAGNOSIS — I2510 Atherosclerotic heart disease of native coronary artery without angina pectoris: Secondary | ICD-10-CM | POA: Diagnosis present

## 2018-01-27 DIAGNOSIS — J690 Pneumonitis due to inhalation of food and vomit: Secondary | ICD-10-CM | POA: Diagnosis present

## 2018-01-27 DIAGNOSIS — R262 Difficulty in walking, not elsewhere classified: Secondary | ICD-10-CM

## 2018-01-27 DIAGNOSIS — K5900 Constipation, unspecified: Secondary | ICD-10-CM | POA: Diagnosis present

## 2018-01-27 DIAGNOSIS — Z993 Dependence on wheelchair: Secondary | ICD-10-CM

## 2018-01-27 DIAGNOSIS — J9621 Acute and chronic respiratory failure with hypoxia: Secondary | ICD-10-CM | POA: Diagnosis present

## 2018-01-27 DIAGNOSIS — E870 Hyperosmolality and hypernatremia: Secondary | ICD-10-CM | POA: Diagnosis present

## 2018-01-27 DIAGNOSIS — F02818 Dementia in other diseases classified elsewhere, unspecified severity, with other behavioral disturbance: Secondary | ICD-10-CM

## 2018-01-27 DIAGNOSIS — K6389 Other specified diseases of intestine: Secondary | ICD-10-CM

## 2018-01-27 DIAGNOSIS — I959 Hypotension, unspecified: Secondary | ICD-10-CM | POA: Diagnosis not present

## 2018-01-27 DIAGNOSIS — L8990 Pressure ulcer of unspecified site, unspecified stage: Secondary | ICD-10-CM

## 2018-01-27 DIAGNOSIS — I5032 Chronic diastolic (congestive) heart failure: Secondary | ICD-10-CM | POA: Diagnosis present

## 2018-01-27 DIAGNOSIS — Z09 Encounter for follow-up examination after completed treatment for conditions other than malignant neoplasm: Secondary | ICD-10-CM

## 2018-01-27 DIAGNOSIS — I130 Hypertensive heart and chronic kidney disease with heart failure and stage 1 through stage 4 chronic kidney disease, or unspecified chronic kidney disease: Secondary | ICD-10-CM | POA: Diagnosis present

## 2018-01-27 DIAGNOSIS — R0902 Hypoxemia: Secondary | ICD-10-CM

## 2018-01-27 LAB — CBC WITH DIFFERENTIAL/PLATELET
Abs Immature Granulocytes: 0.12 10*3/uL — ABNORMAL HIGH (ref 0.00–0.07)
Basophils Absolute: 0 10*3/uL (ref 0.0–0.1)
Basophils Relative: 0 %
Eosinophils Absolute: 0 10*3/uL (ref 0.0–0.5)
Eosinophils Relative: 0 %
HCT: 47.9 % — ABNORMAL HIGH (ref 36.0–46.0)
Hemoglobin: 14.9 g/dL (ref 12.0–15.0)
Immature Granulocytes: 1 %
Lymphocytes Relative: 7 %
Lymphs Abs: 1.2 10*3/uL (ref 0.7–4.0)
MCH: 25.7 pg — ABNORMAL LOW (ref 26.0–34.0)
MCHC: 31.1 g/dL (ref 30.0–36.0)
MCV: 82.7 fL (ref 80.0–100.0)
Monocytes Absolute: 1.4 10*3/uL — ABNORMAL HIGH (ref 0.1–1.0)
Monocytes Relative: 8 %
Neutro Abs: 15.4 10*3/uL — ABNORMAL HIGH (ref 1.7–7.7)
Neutrophils Relative %: 84 %
Platelets: 250 10*3/uL (ref 150–400)
RBC: 5.79 MIL/uL — ABNORMAL HIGH (ref 3.87–5.11)
RDW: 15.2 % (ref 11.5–15.5)
WBC: 18.1 10*3/uL — ABNORMAL HIGH (ref 4.0–10.5)
nRBC: 0 % (ref 0.0–0.2)

## 2018-01-27 LAB — COMPREHENSIVE METABOLIC PANEL
ALT: 23 U/L (ref 0–44)
AST: 28 U/L (ref 15–41)
Albumin: 4.4 g/dL (ref 3.5–5.0)
Alkaline Phosphatase: 87 U/L (ref 38–126)
Anion gap: 16 — ABNORMAL HIGH (ref 5–15)
BUN: 76 mg/dL — ABNORMAL HIGH (ref 8–23)
CO2: 19 mmol/L — ABNORMAL LOW (ref 22–32)
Calcium: 9.5 mg/dL (ref 8.9–10.3)
Chloride: 106 mmol/L (ref 98–111)
Creatinine, Ser: 3.13 mg/dL — ABNORMAL HIGH (ref 0.44–1.00)
GFR calc Af Amer: 16 mL/min — ABNORMAL LOW (ref >60)
GFR calc non Af Amer: 13 mL/min — ABNORMAL LOW (ref >60)
Glucose, Bld: 155 mg/dL — ABNORMAL HIGH (ref 70–99)
Potassium: 2.2 mmol/L — CL (ref 3.5–5.1)
Sodium: 141 mmol/L (ref 135–145)
Total Bilirubin: 1.4 mg/dL — ABNORMAL HIGH (ref 0.3–1.2)
Total Protein: 7.7 g/dL (ref 6.5–8.1)

## 2018-01-27 LAB — URINALYSIS, ROUTINE W REFLEX MICROSCOPIC
Bilirubin Urine: NEGATIVE
Glucose, UA: NEGATIVE mg/dL
Hgb urine dipstick: NEGATIVE
Ketones, ur: NEGATIVE mg/dL
Leukocytes, UA: NEGATIVE
Nitrite: NEGATIVE
Protein, ur: 100 mg/dL — AB
Specific Gravity, Urine: 1.027 (ref 1.005–1.030)
Squamous Epithelial / LPF: >50 — ABNORMAL HIGH (ref 0–5)
pH: 5 (ref 5.0–8.0)

## 2018-01-27 LAB — LIPASE, BLOOD: Lipase: 63 U/L — ABNORMAL HIGH (ref 11–51)

## 2018-01-27 LAB — PHOSPHORUS: Phosphorus: 5.1 mg/dL — ABNORMAL HIGH (ref 2.5–4.6)

## 2018-01-27 LAB — MAGNESIUM: Magnesium: 2.8 mg/dL — ABNORMAL HIGH (ref 1.7–2.4)

## 2018-01-27 LAB — TSH: TSH: 3.66 u[IU]/mL (ref 0.350–4.500)

## 2018-01-27 MED ORDER — FLEET ENEMA 7-19 GM/118ML RE ENEM
1.0000 | ENEMA | Freq: Once | RECTAL | Status: AC
  Administered 2018-01-27: 1 via RECTAL
  Filled 2018-01-27: qty 1

## 2018-01-27 MED ORDER — SODIUM CHLORIDE 0.9 % IV SOLN
INTRAVENOUS | Status: DC
  Administered 2018-01-27: 12:00:00 via INTRAVENOUS

## 2018-01-27 MED ORDER — SODIUM CHLORIDE 0.9 % IV SOLN: INTRAVENOUS | Status: DC

## 2018-01-27 MED ORDER — POTASSIUM CHLORIDE IN NACL 40-0.9 MEQ/L-% IV SOLN
INTRAVENOUS | Status: DC
  Administered 2018-01-27 – 2018-01-30 (×5): 75 mL/h via INTRAVENOUS
  Filled 2018-01-27 (×7): qty 1000

## 2018-01-27 MED ORDER — POTASSIUM CHLORIDE 10 MEQ/100ML IV SOLN
10.0000 meq | INTRAVENOUS | Status: AC
  Administered 2018-01-27 (×2): 10 meq via INTRAVENOUS
  Filled 2018-01-27 (×2): qty 100

## 2018-01-27 MED ORDER — ONDANSETRON HCL 4 MG/2ML IJ SOLN
4.0000 mg | Freq: Once | INTRAMUSCULAR | Status: AC
  Administered 2018-01-27: 4 mg via INTRAVENOUS
  Filled 2018-01-27: qty 2

## 2018-01-27 MED ORDER — ACETAMINOPHEN 325 MG PO TABS
650.0000 mg | ORAL_TABLET | Freq: Four times a day (QID) | ORAL | Status: DC | PRN
  Administered 2018-01-29: 650 mg via ORAL
  Filled 2018-01-27: qty 2

## 2018-01-27 MED ORDER — IPRATROPIUM BROMIDE 0.02 % IN SOLN
0.5000 mg | Freq: Three times a day (TID) | RESPIRATORY_TRACT | Status: DC
  Administered 2018-01-28 – 2018-01-29 (×6): 0.5 mg via RESPIRATORY_TRACT
  Filled 2018-01-27 (×6): qty 2.5

## 2018-01-27 MED ORDER — PIPERACILLIN-TAZOBACTAM 3.375 G IVPB 30 MIN
3.3750 g | Freq: Once | INTRAVENOUS | Status: AC
  Administered 2018-01-27: 3.375 g via INTRAVENOUS
  Filled 2018-01-27: qty 50

## 2018-01-27 MED ORDER — IPRATROPIUM BROMIDE 0.02 % IN SOLN
0.5000 mg | Freq: Four times a day (QID) | RESPIRATORY_TRACT | Status: DC
  Administered 2018-01-27: 0.5 mg via RESPIRATORY_TRACT
  Filled 2018-01-27: qty 2.5

## 2018-01-27 MED ORDER — LEVALBUTEROL HCL 1.25 MG/0.5ML IN NEBU
1.2500 mg | INHALATION_SOLUTION | Freq: Three times a day (TID) | RESPIRATORY_TRACT | Status: DC
  Administered 2018-01-28 – 2018-01-29 (×6): 1.25 mg via RESPIRATORY_TRACT
  Filled 2018-01-27 (×6): qty 0.5
  Filled 2018-01-27: qty 6

## 2018-01-27 MED ORDER — METRONIDAZOLE IN NACL 5-0.79 MG/ML-% IV SOLN
500.0000 mg | Freq: Once | INTRAVENOUS | Status: AC
  Administered 2018-01-27: 500 mg via INTRAVENOUS
  Filled 2018-01-27: qty 100

## 2018-01-27 MED ORDER — GERHARDT'S BUTT CREAM
1.0000 "application " | TOPICAL_CREAM | Freq: Every day | CUTANEOUS | Status: DC | PRN
  Filled 2018-01-27: qty 1

## 2018-01-27 MED ORDER — LEVALBUTEROL HCL 1.25 MG/0.5ML IN NEBU
INHALATION_SOLUTION | RESPIRATORY_TRACT | Status: AC
  Filled 2018-01-27: qty 0.5

## 2018-01-27 MED ORDER — SODIUM CHLORIDE 0.9 % IV BOLUS
1000.0000 mL | Freq: Once | INTRAVENOUS | Status: AC
  Administered 2018-01-27: 1000 mL via INTRAVENOUS

## 2018-01-27 MED ORDER — VITAMIN D (ERGOCALCIFEROL) 1.25 MG (50000 UNIT) PO CAPS: 50000.0000 [IU] | ORAL_CAPSULE | ORAL | Status: DC

## 2018-01-27 MED ORDER — ACETAMINOPHEN 500 MG PO TABS: 500.0000 mg | ORAL_TABLET | Freq: Four times a day (QID) | ORAL | Status: DC | PRN

## 2018-01-27 MED ORDER — LEVALBUTEROL HCL 1.25 MG/0.5ML IN NEBU
INHALATION_SOLUTION | RESPIRATORY_TRACT | Status: AC
  Administered 2018-01-27: 1.25 mg
  Filled 2018-01-27: qty 0.5

## 2018-01-27 MED ORDER — ONDANSETRON HCL 4 MG PO TABS: 4.0000 mg | ORAL_TABLET | Freq: Four times a day (QID) | ORAL | Status: DC | PRN

## 2018-01-27 MED ORDER — PIPERACILLIN-TAZOBACTAM IN DEX 2-0.25 GM/50ML IV SOLN
2.2500 g | Freq: Four times a day (QID) | INTRAVENOUS | Status: DC
  Administered 2018-01-27 – 2018-01-28 (×4): 2.25 g via INTRAVENOUS
  Filled 2018-01-27 (×8): qty 50

## 2018-01-27 MED ORDER — POLYETHYLENE GLYCOL 3350 17 G PO PACK
17.0000 g | PACK | Freq: Two times a day (BID) | ORAL | Status: DC
  Administered 2018-01-27 – 2018-02-01 (×8): 17 g via ORAL
  Filled 2018-01-27 (×9): qty 1

## 2018-01-27 MED ORDER — ASPIRIN EC 81 MG PO TBEC
81.0000 mg | DELAYED_RELEASE_TABLET | Freq: Every day | ORAL | Status: DC
  Administered 2018-01-28 – 2018-02-02 (×6): 81 mg via ORAL
  Filled 2018-01-27 (×7): qty 1

## 2018-01-27 MED ORDER — LEVALBUTEROL HCL 0.63 MG/3ML IN NEBU
0.6300 mg | INHALATION_SOLUTION | Freq: Four times a day (QID) | RESPIRATORY_TRACT | Status: DC
  Filled 2018-01-27 (×3): qty 3

## 2018-01-27 MED ORDER — SORBITOL 70 % SOLN
960.0000 mL | TOPICAL_OIL | Freq: Once | ORAL | Status: AC
  Administered 2018-01-27: 960 mL via RECTAL
  Filled 2018-01-27: qty 473

## 2018-01-27 MED ORDER — IBUPROFEN 200 MG PO TABS: 400.0000 mg | ORAL_TABLET | Freq: Four times a day (QID) | ORAL | Status: DC | PRN

## 2018-01-27 MED ORDER — ONDANSETRON HCL 4 MG/2ML IJ SOLN: 4.0000 mg | Freq: Four times a day (QID) | INTRAMUSCULAR | Status: DC | PRN

## 2018-01-27 MED ORDER — ACETAMINOPHEN 650 MG RE SUPP: 650.0000 mg | Freq: Four times a day (QID) | RECTAL | Status: DC | PRN

## 2018-01-27 MED ORDER — MIRABEGRON ER 25 MG PO TB24: 25.0000 mg | ORAL_TABLET | Freq: Every day | ORAL | Status: DC

## 2018-01-27 MED ORDER — HEPARIN SODIUM (PORCINE) 5000 UNIT/ML IJ SOLN
5000.0000 [IU] | Freq: Three times a day (TID) | INTRAMUSCULAR | Status: DC
  Administered 2018-01-27 – 2018-02-03 (×19): 5000 [IU] via SUBCUTANEOUS
  Filled 2018-01-27 (×19): qty 1

## 2018-01-27 MED ORDER — METOPROLOL TARTRATE 25 MG PO TABS
50.0000 mg | ORAL_TABLET | Freq: Two times a day (BID) | ORAL | Status: DC
  Administered 2018-01-27 – 2018-02-04 (×15): 50 mg via ORAL
  Filled 2018-01-27 (×3): qty 1
  Filled 2018-01-27: qty 2
  Filled 2018-01-27 (×2): qty 1
  Filled 2018-01-27 (×3): qty 2
  Filled 2018-01-27 (×7): qty 1

## 2018-01-27 MED ORDER — SENNOSIDES-DOCUSATE SODIUM 8.6-50 MG PO TABS
1.0000 | ORAL_TABLET | Freq: Two times a day (BID) | ORAL | Status: DC
  Administered 2018-01-27 – 2018-02-01 (×9): 1 via ORAL
  Filled 2018-01-27 (×10): qty 1

## 2018-01-27 MED ORDER — SODIUM CHLORIDE 0.9 % IV BOLUS
500.0000 mL | Freq: Once | INTRAVENOUS | Status: AC
  Administered 2018-01-27: 500 mL via INTRAVENOUS

## 2018-01-27 MED ORDER — POTASSIUM CHLORIDE 10 MEQ/100ML IV SOLN
10.0000 meq | INTRAVENOUS | Status: AC
  Administered 2018-01-27 – 2018-01-28 (×6): 10 meq via INTRAVENOUS
  Filled 2018-01-27 (×6): qty 100

## 2018-01-27 NOTE — ED Triage Notes (Signed)
[facility]  Abdominal distention and firm that has gotten worse today. Staff unaware of last BM Denies abdominal pain  Hx. Dementia, parkinson Alert and oriented to self  Daughter is on the way.   cbg-152 BP-110/82 P-74 93% RA rr-18

## 2018-01-27 NOTE — ED Notes (Signed)
Spoke to Kateri Mcmair, MD. Made MD aware of no urina output. Bladder scan ordered.

## 2018-01-27 NOTE — ED Notes (Signed)
ED TO INPATIENT HANDOFF REPORT  Name/Age/Gender Miki Kins 77 y.o. female  Code Status    Code Status Orders  (From admission, onward)         Start     Ordered   01/27/18 1652  Do not attempt resuscitation (DNR)  Continuous    Question Answer Comment  In the event of cardiac or respiratory ARREST Do not call a "code blue"   In the event of cardiac or respiratory ARREST Do not perform Intubation, CPR, defibrillation or ACLS   In the event of cardiac or respiratory ARREST Use medication by any route, position, wound care, and other measures to relive pain and suffering. May use oxygen, suction and manual treatment of airway obstruction as needed for comfort.      01/27/18 1653        Code Status History    Date Active Date Inactive Code Status Order ID Comments User Context   01/27/2018 1458 01/27/2018 1654 DNR [id number]  Linwood Dibbles, MD ED   09/03/2013 1550 09/06/2013 2005 Full Code [id number]  Edsel Petrin, DO Inpatient    Advance Directive Documentation     Most Recent Value  Type of Advance Directive  Out of facility DNR (pink MOST or yellow form)  Pre-existing out of facility DNR order (yellow form or pink MOST form)  Pink MOST form placed in chart (order not valid for inpatient use)  "MOST" Form in Place?  -      Home/SNF/Other Skilled nursing facility  Chief Complaint abd issues   Level of Care/Admitting Diagnosis ED Disposition    ED Disposition Condition Comment   Admit  Hospital Area: [facility] [[id number]]  Level of Care: Med-Surg [16]  Diagnosis: Constipation [725366]  Admitting Physician: Marguerita Merles LATIF [[id number]]  Attending Physician: Marguerita Merles LATIF [[id number]]  PT Class (Do Not Modify): Observation [104]  PT Acc Code (Do Not Modify): Observation [10022]       Medical History Past Medical History:  Diagnosis Date  . Anxiety disorder   . Atrial fibrillation (HCC)    a. new 04/2011;  b. Pradaxa  . Chronic  diastolic heart failure (HCC)    Echocardiogram 04/14/11: Moderate LVH, EF 60-65%, mild LAE.  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 Gastritis   . H/O: hysterectomy   . Heart murmur    from age 3 associated with some hemoptysis  . Hypercholesteremia   . Hypertension   . Impaired glucose tolerance   . Obesity   . Osteoarthritis   . Parkinson's disease (HCC)    patient denies having parkinsons  . Stroke [facility]) 2007   Residual R sided weakness, uses a wheelchair, walker at home    Allergies Allergies  Allergen Reactions  . Sulfonamide Derivatives Other (See Comments)    Unknown. Pt doesn't remember.    IV Location/Drains/Wounds Patient Lines/Drains/Airways Status   Active Line/Drains/Airways    Name:   Placement date:   Placement time:   Site:   Days:   Peripheral IV 01/27/18 Right;Upper Arm   01/27/18    1112    Arm   less than 1   External Urinary Catheter   01/27/18    1138    -   less than 1          Labs/Imaging Results for orders placed or performed during the hospital encounter of 01/27/18 (from the past  48 hour(s))  Comprehensive metabolic panel     Status: Abnormal   Collection Time: 01/27/18 11:10 AM  Result Value Ref Range   Sodium 141 135 - 145 mmol/L   Potassium 2.2 (LL) 3.5 - 5.1 mmol/L    Comment: CRITICAL RESULT CALLED TO, READ BACK BY AND VERIFIED WITH: Roselani Grajeda,L. RN AT 1213 01/27/18 MULLINS,T    Chloride 106 98 - 111 mmol/L   CO2 19 (L) 22 - 32 mmol/L   Glucose, Bld 155 (H) 70 - 99 mg/dL   BUN 76 (H) 8 - 23 mg/dL   Creatinine, Ser 7.823.13 (H) 0.44 - 1.00 mg/dL   Calcium 9.5 8.9 - 95.610.3 mg/dL   Total Protein 7.7 6.5 - 8.1 g/dL   Albumin 4.4 3.5 - 5.0 g/dL   AST 28 15 - 41 U/L   ALT 23 0 - 44 U/L   Alkaline Phosphatase 87 38 - 126 U/L   Total Bilirubin 1.4 (H) 0.3 - 1.2 mg/dL   GFR calc non Af Amer 13 (L) >60 mL/min   GFR calc Af Amer 16 (L) >60 mL/min    Anion gap 16 (H) 5 - 15    Comment: Performed at [facility], [address] [place], [place] [address]  Lipase, blood     Status: Abnormal   Collection Time: 01/27/18 11:10 AM  Result Value Ref Range   Lipase 63 (H) 11 - 51 U/L    Comment: Performed at [facility], [address] [place], [place] [address]  CBC WITH DIFFERENTIAL     Status: Abnormal   Collection Time: 01/27/18 11:10 AM  Result Value Ref Range   WBC 18.1 (H) 4.0 - 10.5 K/uL   RBC 5.79 (H) 3.87 - 5.11 MIL/uL   Hemoglobin 14.9 12.0 - 15.0 g/dL   HCT 69.647.9 (H) 29.536.0 - 28.446.0 %   MCV 82.7 80.0 - 100.0 fL   MCH 25.7 (L) 26.0 - 34.0 pg   MCHC 31.1 30.0 - 36.0 g/dL   RDW 13.215.2 44.011.5 - 10.215.5 %   Platelets 250 150 - 400 K/uL   nRBC 0.0 0.0 - 0.2 %   Neutrophils Relative % 84 %   Neutro Abs 15.4 (H) 1.7 - 7.7 K/uL   Lymphocytes Relative 7 %   Lymphs Abs 1.2 0.7 - 4.0 K/uL   Monocytes Relative 8 %   Monocytes Absolute 1.4 (H) 0.1 - 1.0 K/uL   Eosinophils Relative 0 %   Eosinophils Absolute 0.0 0.0 - 0.5 K/uL   Basophils Relative 0 %   Basophils Absolute 0.0 0.0 - 0.1 K/uL   Immature Granulocytes 1 %   Abs Immature Granulocytes 0.12 (H) 0.00 - 0.07 K/uL    Comment: Performed at [facility], [address] [place], [place] [address]  Magnesium     Status: Abnormal   Collection Time: 01/27/18 11:10 AM  Result Value Ref Range   Magnesium 2.8 (H) 1.7 - 2.4 mg/dL    Comment: Performed at [facility], [address] [place], [place] [address]  Urinalysis, Routine w reflex microscopic     Status: Abnormal   Collection Time: 01/27/18  5:23 PM  Result Value Ref Range   Color, Urine AMBER (A) YELLOW    Comment: BIOCHEMICALS MAY BE AFFECTED BY COLOR   APPearance CLOUDY (A) CLEAR   Specific Gravity, Urine 1.027 1.005 - 1.030   pH 5.0 5.0 - 8.0   Glucose, UA NEGATIVE NEGATIVE mg/dL  Hgb urine dipstick NEGATIVE NEGATIVE   Bilirubin Urine NEGATIVE  NEGATIVE   Ketones, ur NEGATIVE NEGATIVE mg/dL   Protein, ur 119100 (A) NEGATIVE mg/dL   Nitrite NEGATIVE NEGATIVE   Leukocytes, UA NEGATIVE NEGATIVE   RBC / HPF 6-10 0 - 5 RBC/hpf   WBC, UA 6-10 0 - 5 WBC/hpf   Bacteria, UA FEW (A) NONE SEEN   Squamous Epithelial / LPF >50 (H) 0 - 5   Mucus PRESENT    Hyaline Casts, UA PRESENT     Comment: Performed at [facility], [address] [place], [place] [address]   Ct Abdomen Pelvis Wo Contrast  Result Date: 01/27/2018 CLINICAL DATA:  Abdominal distention. EXAM: CT ABDOMEN AND PELVIS WITHOUT CONTRAST TECHNIQUE: Multidetector CT imaging of the abdomen and pelvis was performed following the standard protocol without IV contrast. COMPARISON:  09/02/2013 FINDINGS: Lower chest: Atelectasis with subpleural consolidation noted within the right lower lobe. Hepatobiliary: No focal liver abnormality is seen. No gallstones, gallbladder wall thickening, or biliary dilatation. Pancreas: Unremarkable. No pancreatic ductal dilatation or surrounding inflammatory changes. Spleen: Calcified splenic mass is again identified measuring 4.6 by 5.2 cm, image 35/2. Unchanged. Adrenals/Urinary Tract: Normal appearance of the adrenal glands. No hydronephrosis identified bilaterally. Intermediate attenuating nodule arising from inferior pole of the posterior cortex of right kidney is unchanged measuring 1 cm. This is incompletely characterized without IV contrast. Urinary bladder is unremarkable. Stomach/Bowel: Stomach appears collapsed. Small bowel loops are normal in course and caliber. There is marked diffuse gaseous distension of the colon up to the level of the rectum. A very large stool burden is identified within the rectum. Desiccated stool ball measures 16.7 x 9.8 by 23.0 cm. Moderate amount of stool identified within the ascending colon. Vascular/Lymphatic: Aortic atherosclerosis. No aneurysm. No abdominopelvic adenopathy. Reproductive: The uterus is not  confidently identified and may be surgically absent no. No adnexal mass. Other: No free fluid or fluid collections identified. Musculoskeletal: . Mild scoliosis and multi level lumbar degenerative disc disease. IMPRESSION: 1. There is diffuse gaseous distension of the colon. A very large stool burden is identified within the rectum. Findings are favored to represent rectal impaction. 2. Unchanged calcified splenic mass compatible with a benign abnormality. 3.  Aortic Atherosclerosis (ICD10-I70.0). Electronically Signed   By: Signa Kellaylor  Stroud M.D.   On: 01/27/2018 14:36   None  Pending Labs Unresulted Labs (From admission, onward)    Start     Ordered   01/28/18 0500  Comprehensive metabolic panel  Tomorrow morning,   R     01/27/18 1654   01/28/18 0500  CBC  Tomorrow morning,   R     01/27/18 1654   01/27/18 1653  Phosphorus  Once,   R     01/27/18 1654   01/27/18 1653  TSH  Add-on,   R     01/27/18 1654   01/27/18 1621  Culture, Urine  Once,   R     01/27/18 1620          Vitals/Pain Today's Vitals   01/27/18 1600 01/27/18 1630 01/27/18 1700 01/27/18 1800  BP: 115/76 118/82 114/74 128/84  Pulse: 67 66 66 64  Resp: 17 16 16 17   Temp:      TempSrc:      SpO2: 93% 93% 91% 93%  PainSc:        Isolation Precautions No active isolations  Medications Medications  sodium chloride 0.9 % bolus 500 mL (0 mLs Intravenous Stopped 01/27/18  1216)    And  0.9 %  sodium chloride infusion ( Intravenous Stopped 01/27/18 1435)  aspirin EC tablet 81 mg (has no administration in time range)  metoprolol tartrate (LOPRESSOR) tablet 50 mg (has no administration in time range)  mirabegron ER (MYRBETRIQ) tablet 25 mg (has no administration in time range)  Vitamin D (Ergocalciferol) (DRISDOL) capsule 50,000 Units (has no administration in time range)  Gerhardt's butt cream 1 application (has no administration in time range)  heparin injection 5,000 Units (has no administration in time range)  0.9  %  sodium chloride infusion (has no administration in time range)  acetaminophen (TYLENOL) tablet 650 mg (has no administration in time range)    Or  acetaminophen (TYLENOL) suppository 650 mg (has no administration in time range)  polyethylene glycol (MIRALAX / GLYCOLAX) packet 17 g (has no administration in time range)  senna-docusate (Senokot-S) tablet 1 tablet (has no administration in time range)  sorbitol, milk of mag, mineral oil, glycerin (SMOG) enema (has no administration in time range)  ondansetron (ZOFRAN) tablet 4 mg (has no administration in time range)    Or  ondansetron (ZOFRAN) injection 4 mg (has no administration in time range)  ondansetron (ZOFRAN) injection 4 mg (4 mg Intravenous Given 01/27/18 1112)  potassium chloride 10 mEq in 100 mL IVPB (0 mEq Intravenous Stopped 01/27/18 1435)  sodium phosphate (FLEET) 7-19 GM/118ML enema 1 enema (1 enema Rectal Given 01/27/18 1531)  piperacillin-tazobactam (ZOSYN) IVPB 3.375 g (0 g Intravenous Stopped 01/27/18 1623)  metroNIDAZOLE (FLAGYL) IVPB 500 mg (0 mg Intravenous Stopped 01/27/18 1809)  sodium chloride 0.9 % bolus 500 mL (0 mLs Intravenous Stopped 01/27/18 1641)  sodium chloride 0.9 % bolus 1,000 mL (0 mLs Intravenous Stopped 01/27/18 1809)    Mobility non-ambulatory

## 2018-01-27 NOTE — ED Notes (Signed)
First set of cultures sent to lab in save tubes. No orders at this time.

## 2018-01-27 NOTE — H&P (Signed)
History and Physical    EGYPT WELCOME ZOX:[id number] DOB: 1940-12-15 DOA: 01/27/2018  PCP: Nadara Eaton, MD   Patient coming from: SNF  Chief Complaint: Abdominal Distention   HPI: [name] is a 77 y.o. female with medical history significant of atrial fibrillation, chronic diastolic CHF, CAD, history of hysterectomy, hyperlipidemia, prediabetes, Parkinson's disease and dementia, history of CVA with right-sided weakness, history of heart murmur and other comorbidities who presents with abdominal distention, poor p.o. intake, as well as dehydration and inability to defecate.  Patient is unable to provide respiratory history due to her advanced dementia and Parkinson's disease and does answer simple questions yes and no but is unable to fully explain.  Currently stays at a skilled nursing facility and reportedly the staff there states that she has had poor p.o. intake and has been eating since Saturday and has not been drinking at all.  Patient's daughter is at bedside and provides most of the history and she states the patient ate Friday night.  There is noted abdominal distention today but daughter states that she noticed yesterday and mentioned to the nurse.  Is unclear when she has had a bowel movement or not.  Patient is unable to tell us when she is abdominal due to her underlying dementia.  Patient's family states that she is not been complaining of anything but CNA at the facility noticed that she had started vomiting on Sunday evening.  She is found to have severe abdominal distention and a CT of the abdomen pelvis was done which showed she is gaseous distention of the colon the very large stool burden identified within the rectum which favored a rectal impaction.  [facility] was called to admit this patient for abdominal distention and suspected rectal impaction and she started on a bowel regimen and given a suppository.  Additionally patient was found to have an AKI likely due to her poor  p.o. intake.  ED Course: In the ED she had basic blood work done as well as a CT of the abdomen pelvis.  She is also given a Fleet enema without much success.  EDP also give a 500 mL bolus and placed her on normal saline at a rate of 125 mL's per hour which I decreased down to 75 mL's per hour.  Review of Systems: As per HPI otherwise 10 point review of systems negative.   Past Medical History:  Diagnosis Date  . Anxiety disorder   . Atrial fibrillation (HCC)    a. new 04/2011;  b. Pradaxa  . Chronic diastolic heart failure (HCC)    Echocardiogram 04/14/11: Moderate LVH, EF 60-65%, mild LAE.  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 Gastritis   . H/O: hysterectomy   . Heart murmur    from age 66 associated with some hemoptysis  . Hypercholesteremia   . Hypertension   . Impaired glucose tolerance   . Obesity   . Osteoarthritis   . Parkinson's disease (HCC)    patient denies having parkinsons  . Stroke [facility]) 2007   Residual R sided weakness, uses a wheelchair, walker at home   Past Surgical History:  Procedure Laterality Date  . CARPAL TUNNEL RELEASE     right  . 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 LEFT HEART CATHETERIZATION WITH CORONARY ANGIOGRAM N/A 05/03/2011   Procedure: LEFT  HEART CATHETERIZATION WITH CORONARY ANGIOGRAM;  Surgeon: Herby Abraham, MD;  Location: [facility] CATH LAB;  Service: Cardiovascular;  Laterality: N/A;  . REPLACEMENT TOTAL KNEE     right  . TONSILLECTOMY     SOCIAL HISTORY  reports that she has never smoked. She has never used smokeless tobacco. She reports that she does not drink alcohol or use drugs.  Allergies  Allergen Reactions  . Sulfonamide Derivatives Other (See Comments)    Unknown. Pt doesn't remember.   Family History  Problem Relation Age of Onset  . Cancer Mother 23       died  . Heart attack Father 75       died   Prior  to Admission medications   Medication Sig Start Date End Date Taking? Authorizing Provider  amLODipine-benazepril (LOTREL) 5-20 MG per capsule Take 1 capsule by mouth daily.   Yes [provider]  aspirin EC 81 MG tablet Take 1 tablet (81 mg total) by mouth daily. 09/04/13  Yes Ghimire, Werner Lean, MD  Cyanocobalamin (VITAMIN B 12 PO) Take 1 tablet by mouth daily.   Yes [provider]  Hydrocortisone (GERHARDT'S BUTT CREAM) CREA Apply 1 application topically daily as needed for irritation.   Yes [provider]  metoprolol (LOPRESSOR) 50 MG tablet Take 50 mg by mouth 2 (two) times daily.   Yes [provider]  MYRBETRIQ 25 MG TB24 tablet  02/17/15  Yes [provider]  polyethylene glycol (MIRALAX) packet Take 17 g by mouth daily. 09/02/13  Yes Horton, Mayer Masker, MD  Vitamin D, Ergocalciferol, (DRISDOL) 1.25 MG (50000 UT) CAPS capsule Take 50,000 Units by mouth every 30 (thirty) days. 15th   Yes [provider]  cefTRIAXone (ROCEPHIN) 1 g injection  02/18/15   [provider]  QUEtiapine (SEROQUEL) 25 MG tablet 1-2 tabs qhs Patient not taking: Reported on 01/27/2018 04/01/14   Levert Feinstein, MD   Physical Exam: Vitals:   01/27/18 1630 01/27/18 1700 01/27/18 1800 01/27/18 1830  BP: 118/82 114/74 128/84 121/73  Pulse: 66 66 64 63  Resp: 16 16 17 18   Temp:      TempSrc:      SpO2: 93% 91% 93% 94%   Constitutional: Chronically ill appearing pleasantly demented  AAF in NAD and appears calm and comfortable Eyes: Left eye changes; Right eye appears normal ENMT: External Ears, Nose appear normal. Grossly normal hearing.  Neck: Appears normal, supple, no cervical masses, normal ROM, no appreciable thyromegaly; no JVD Respiratory: Diminished to auscultation bilaterally with coarse breath sounds, no wheezing, rales, rhonchi or crackles. Normal respiratory effort and patient is not tachypenic. No accessory muscle use. Wearing Supplemental O2 via  Angels Cardiovascular: Irregularly Irregular but not tachycardic; Has a murmur. S1 and S2 auscultated.  Abdomen: Soft, non-tender, non-distended. No masses palpated. No appreciable hepatosplenomegaly. Bowel sounds positive x4.  GU: Deferred. Musculoskeletal: No clubbing / cyanosis of digits/nails. No joint deformity upper and lower extremities. .  Skin: No rashes, lesions, ulcers on a limited skin. No induration; Warm and dry.  Neurologic: CN 2-12 grossly intact but has Residual Right sided weakness.  Romberg sign cerebellar reflexes not assessed.  Psychiatric: Impaired judgment and insight. Alert and oriented x 3. Normal mood and flat affect.   Labs on Admission: I have personally reviewed following labs and imaging studies  CBC: Recent Labs  Lab 01/27/18 1110  WBC 18.1*  NEUTROABS 15.4*  HGB 14.9  HCT 47.9*  MCV 82.7  PLT 250  Basic Metabolic Panel: Recent Labs  Lab 01/27/18 1110  NA 141  K 2.2*  CL 106  CO2 19*  GLUCOSE 155*  BUN 76*  CREATININE 3.13*  CALCIUM 9.5  MG 2.8*   GFR: CrCl cannot be calculated (Unknown ideal weight.). Liver Function Tests: Recent Labs  Lab 01/27/18 1110  AST 28  ALT 23  ALKPHOS 87  BILITOT 1.4*  PROT 7.7  ALBUMIN 4.4   Recent Labs  Lab 01/27/18 1110  LIPASE 63*   No results for input(s): AMMONIA in the last 168 hours. Coagulation Profile: No results for input(s): INR, PROTIME in the last 168 hours. Cardiac Enzymes: No results for input(s): CKTOTAL, CKMB, CKMBINDEX, TROPONINI in the last 168 hours. BNP (last 3 results) No results for input(s): PROBNP in the last 8760 hours. HbA1C: No results for input(s): HGBA1C in the last 72 hours. CBG: No results for input(s): GLUCAP in the last 168 hours. Lipid Profile: No results for input(s): CHOL, HDL, LDLCALC, TRIG, CHOLHDL, LDLDIRECT in the last 72 hours. Thyroid Function Tests: No results for input(s): TSH, T4TOTAL, FREET4, T3FREE, THYROIDAB in the last 72 hours. Anemia  Panel: No results for input(s): VITAMINB12, FOLATE, FERRITIN, TIBC, IRON, RETICCTPCT in the last 72 hours. Urine analysis:    Component Value Date/Time   COLORURINE AMBER (A) 01/27/2018 1723   APPEARANCEUR CLOUDY (A) 01/27/2018 1723   LABSPEC 1.027 01/27/2018 1723   PHURINE 5.0 01/27/2018 1723   GLUCOSEU NEGATIVE 01/27/2018 1723   HGBUR NEGATIVE 01/27/2018 1723   BILIRUBINUR NEGATIVE 01/27/2018 1723   KETONESUR NEGATIVE 01/27/2018 1723   PROTEINUR 100 (A) 01/27/2018 1723   UROBILINOGEN 1.0 09/02/2013 1132   NITRITE NEGATIVE 01/27/2018 1723   LEUKOCYTESUR NEGATIVE 01/27/2018 1723   Sepsis Labs: !!!!!!!!!!!!!!!!!!!!!!!!!!!!!!!!!!!!!!!!!!!! @LABRCNTIP (procalcitonin:4,lacticidven:4) )No results found for this or any previous visit (from the past 240 hour(s)).   Radiological Exams on Admission: Ct Abdomen Pelvis Wo Contrast  Result Date: 01/27/2018 CLINICAL DATA:  Abdominal distention. EXAM: CT ABDOMEN AND PELVIS WITHOUT CONTRAST TECHNIQUE: Multidetector CT imaging of the abdomen and pelvis was performed following the standard protocol without IV contrast. COMPARISON:  09/02/2013 FINDINGS: Lower chest: Atelectasis with subpleural consolidation noted within the right lower lobe. Hepatobiliary: No focal liver abnormality is seen. No gallstones, gallbladder wall thickening, or biliary dilatation. Pancreas: Unremarkable. No pancreatic ductal dilatation or surrounding inflammatory changes. Spleen: Calcified splenic mass is again identified measuring 4.6 by 5.2 cm, image 35/2. Unchanged. Adrenals/Urinary Tract: Normal appearance of the adrenal glands. No hydronephrosis identified bilaterally. Intermediate attenuating nodule arising from inferior pole of the posterior cortex of right kidney is unchanged measuring 1 cm. This is incompletely characterized without IV contrast. Urinary bladder is unremarkable. Stomach/Bowel: Stomach appears collapsed. Small bowel loops are normal in course and caliber.  There is marked diffuse gaseous distension of the colon up to the level of the rectum. A very large stool burden is identified within the rectum. Desiccated stool ball measures 16.7 x 9.8 by 23.0 cm. Moderate amount of stool identified within the ascending colon. Vascular/Lymphatic: Aortic atherosclerosis. No aneurysm. No abdominopelvic adenopathy. Reproductive: The uterus is not confidently identified and may be surgically absent no. No adnexal mass. Other: No free fluid or fluid collections identified. Musculoskeletal: . Mild scoliosis and multi level lumbar degenerative disc disease. IMPRESSION: 1. There is diffuse gaseous distension of the colon. A very large stool burden is identified within the rectum. Findings are favored to represent rectal impaction. 2. Unchanged calcified splenic mass compatible with a benign abnormality. 3.  Aortic  Atherosclerosis (ICD10-I70.0). Electronically Signed   By: Signa Kell M.D.   On: 01/27/2018 14:36   EKG: No EKG Done on admission so will order one now.  Assessment/Plan Active Problems:   Essential hypertension   Coronary atherosclerosis   Atrial fibrillation (HCC)   Diastolic CHF (HCC)   Ambulatory dysfunction   Parkinson disease (HCC)   Constipation   Dementia due to Parkinson's disease with behavioral disturbance (HCC)   Hypokalemia   AKI (acute kidney injury) (HCC)   Leukocytosis   Acute on chronic respiratory failure with hypoxia (HCC)   Hyperbilirubinemia  Abdominal Distention in the setting of Severe Constipation/Obstipation -Place in Obs Telemetry -Tried Fleet Enema without success -Placed on bowel regimen with MiraLAX 17 g p.o. twice daily, senna docusate 1 tab p.o. twice daily, and try smog enema -If not improving will maybe consider consultation to gastroenterology -Replete electrolytes and maintain potassium above 4.0 and magnesium above 2  Hypokalemia -Patient's K+ on admission was severely low at 2.2 -Pleat with IV potassium 20  Norco once in the ED and I have given additional 60.  I have also started the patient on normal saline +40 mEq of KCl at a rate of 75 mils per hour -Continue monitor replete as necessary -Check magnesium level -Repeat CMP in a.m.  AKI -Check U/A and urinalysis -Obtain renal ultrasound -Bladder scan did not show very much however may be inaccurate so we will check renal ultrasound to see if the patient has any hydronephrosis -Patient is doing well except creatinine on admission was 76/3.13 -Avoid nephrotoxic medications if possible and avoid hypotension -Continue monitor and trend renal function repeat CMP in a.m.  Leukocytosis -? Reactive in the setting of Nausea,Vomiting and Dehydration and possible Aspiration -WBC was 18.1 and Neutrophil Count was 15.4 -Afebrile so do Not feel as if it is infectious mediated as she has no abdominal pain but did sound a little diminished  -Given IV Abx in the ED with IV Zosyn and Metronidazole; Will continue IV Zosyn -Continue to Monitor -Check Blood Cx x2 -Repeat CBC in AM   Acute Respiratory Failure with Hypoxia; ? Aspiration -O2 Saturations dropped to 87% on Room Air -Placed on 2 Liters and O2 Sat's improved to 93% -CT Scan showed Atelectasis with subplerual consolidation noted within the RLL -Check SLP -Check CXR -C/w NPO and Aspiration Precautions -WBC was 18.1 and repeat in AM -Start Xopenex/Atrovent Scheduled   Hyperbilirubinemia -Mild at 1.4 -Continue to Monitor and trend -Repeat CMP in AM   Dementia and Parkinsons -SLP as above -C/w NPO for now -Aspiration Precautions -Delirium Precautions  -C/w Quetiapine 1-2 tabs po qHS  Chronic Diastolic CHF -Continue to Monitor due to Volume Resuscitation -Strict I's/O's; Daily Weights -C/w Metoprolol 50 mg po BID   HTN -Hold Amlodipine-Benezapril 5-20 mg po Daily currently   Hx of A Fib/SVT -C/w Metoprolol 50 mg po BID -C/w Telemetry   CAD -C/w ASA and Metoprolol   DVT  prophylaxis: Heparin 5,000 units sq q8h Code Status: DO NOT RESUSCITATE  Family Communication: Discussed with Daughter Disposition Plan: Anticipate D/C back to SNF when Abdominal Distention is resolved and WBC trends down Consults called: None Admission status: Obs Telemetry  Severity of Illness: The appropriate patient status for this patient is OBSERVATION. Observation status is judged to be reasonable and necessary in order to provide the required intensity of service to ensure the patient's safety. The patient's presenting symptoms, physical exam findings, and initial radiographic and laboratory data in the context of  their medical condition is felt to place them at decreased risk for further clinical deterioration. Furthermore, it is anticipated that the patient will be medically stable for discharge from the hospital within 2 midnights of admission. The following factors support the patient status of observation.   " The patient's presenting symptoms include Abdominal Distention and Constipation. " The physical exam findings include Distended Abdomen and hypertympanic bowel sounds. Has some diminished breath sounds. " The initial radiographic and laboratory data are concerning for a rectal impaction and ? Aspiration PNA; Labs show severe hypokalemia   Merlene Laughtermair Latif Allaina Brotzman, D.O. [facility] PAGER is on AMION  If 7PM-7AM, please contact night-coverage [email or web address] Password [email or web address]  01/27/2018, 7:22 PM

## 2018-01-27 NOTE — ED Notes (Addendum)
Report given to Leanne, RN

## 2018-01-27 NOTE — ED Notes (Signed)
Patient on pure wick and unable to void at this time.

## 2018-01-27 NOTE — Progress Notes (Signed)
Pharmacy Antibiotic Note  [name] is a 77 y.o. female admitted on 01/27/2018 with possible aspiration  pneumonia.  Pharmacy has been consulted for Zosyn dosing.  Plan: Zosyn 3.375g IV x 1 given in ED, continue with 2.25g IV q6h for CrCl < 20 ml/min Follow up renal function & cultures, clinical course, de-escalation as appropriate     Temp (24hrs), Avg:98.3 F (36.8 C), Min:98.3 F (36.8 C), Max:98.3 F (36.8 C)  Recent Labs  Lab 01/27/18 1110  WBC 18.1*  CREATININE 3.13*    CrCl cannot be calculated (Unknown ideal weight.).    Allergies  Allergen Reactions  . Sulfonamide Derivatives Other (See Comments)    Unknown. Pt doesn't remember.    Antimicrobials this admission:  12/24 Flagyl x 1 12/24 Zosyn >>  Dose adjustments this admission:    Microbiology results:  12/24 UCx: sent  Thank you for allowing pharmacy to be a part of this patient's care.  Loralee PacasErin Kristianne Albin, PharmD, BCPS Pager: [phone] 01/27/2018 7:19 PM

## 2018-01-27 NOTE — ED Notes (Signed)
Patient had small bowel movement. Patient cleaned and new brief and pure wick placed.

## 2018-01-27 NOTE — ED Notes (Signed)
Transport has been called. It will be a few transport is on break.

## 2018-01-27 NOTE — ED Provider Notes (Signed)
[facility]-EMERGENCY DEPT Provider Note   CSN: [id number] Arrival date & time: 01/27/18  4098   History   Chief Complaint Chief Complaint  Patient presents with  . Constipation  . Abdominal Distention    HPI [name] is a 77 y.o. female.  HPI Patient presented to the emergency room for evaluation of abdominal distention.  Patient is a resident of a nursing facility.  Family states over the last few days she has not quite been her usual self.  She does have a history of dementia and parkinsons.  Family states that the staff told them that she did have a couple episodes of nausea vomiting recently.  They noticed that her abdomen was distended today and firm.  I do not know the last time she had a bowel movement.  Patient does not have any history of chronic abdominal problems.  She does not have a history of bowel obstruction.  She has not had any prior abdominal surgeries.  Past Medical History:  Diagnosis Date  . Anxiety disorder   . Atrial fibrillation (HCC)    a. new 04/2011;  b. Pradaxa  . Chronic diastolic heart failure (HCC)    Echocardiogram 04/14/11: Moderate LVH, EF 60-65%, mild LAE.  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 Gastritis   . H/O: hysterectomy   . Heart murmur    from age 56 associated with some hemoptysis  . Hypercholesteremia   . Hypertension   . Impaired glucose tolerance   . Obesity   . Osteoarthritis   . Parkinson's disease (HCC)    patient denies having parkinsons  . Stroke [facility]) 2007   Residual R sided weakness, uses a wheelchair, walker at home    Patient Active Problem List   Diagnosis Date Noted  . Lower extremity weakness 09/03/2013  . Ambulatory dysfunction 09/03/2013  . Parkinson disease (HCC) 09/03/2013  . SVT (supraventricular tachycardia) (HCC) 03/23/2012  . Preop cardiovascular exam 07/03/2011  . Chest pain  04/26/2011  . Atrial fibrillation (HCC)   . Diastolic CHF (HCC)   . EDEMA 11/02/2008  . IMPAIRED GLUCOSE TOLERANCE 10/15/2008  . HYPERCHOLESTEROLEMIA 10/15/2008  . OBESITY 10/15/2008  . ANXIETY DISORDER 10/15/2008  . HYPERTENSION, UNSPECIFIED 10/15/2008  . Coronary atherosclerosis 10/15/2008  . GASTRITIS 10/15/2008  . OSTEOARTHRITIS 10/15/2008    Past Surgical History:  Procedure Laterality Date  . CARPAL TUNNEL RELEASE     right  . 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 LEFT HEART CATHETERIZATION WITH CORONARY ANGIOGRAM N/A 05/03/2011   Procedure: LEFT HEART CATHETERIZATION WITH CORONARY ANGIOGRAM;  Surgeon: Herby Abraham, MD;  Location: [facility] CATH LAB;  Service: Cardiovascular;  Laterality: N/A;  . REPLACEMENT TOTAL KNEE     right  . TONSILLECTOMY       OB History   No obstetric history on file.      Home Medications    Prior to Admission medications   Medication Sig Start Date End Date Taking? Authorizing Provider  amLODipine-benazepril (LOTREL) 5-20 MG per capsule Take 1 capsule by mouth daily.   Yes [provider]  aspirin EC 81 MG tablet Take 1 tablet (81 mg total) by mouth daily. 09/04/13  Yes Ghimire, Werner Lean, MD  Cyanocobalamin (VITAMIN B 12 PO) Take 1 tablet by mouth daily.   Yes [provider]  Hydrocortisone (GERHARDT'S  BUTT CREAM) CREA Apply 1 application topically daily as needed for irritation.   Yes [provider]  metoprolol (LOPRESSOR) 50 MG tablet Take 50 mg by mouth 2 (two) times daily.   Yes [provider]  MYRBETRIQ 25 MG TB24 tablet  02/17/15  Yes [provider]  polyethylene glycol (MIRALAX) packet Take 17 g by mouth daily. 09/02/13  Yes Horton, Mayer Masker, MD  Vitamin D, Ergocalciferol, (DRISDOL) 1.25 MG (50000 UT) CAPS capsule Take 50,000 Units by mouth every 30 (thirty) days. 15th   Yes [provider]  cefTRIAXone (ROCEPHIN) 1 g injection  02/18/15   [provider]  QUEtiapine (SEROQUEL) 25 MG tablet 1-2 tabs qhs Patient not taking: Reported on 01/27/2018 04/01/14   Levert Feinstein, MD    Family History Family History  Problem Relation Age of Onset  . Cancer Mother 61       died  . Heart attack Father 34       died    Social History Social History   Tobacco Use  . Smoking status: Never Smoker  . Smokeless tobacco: Never Used  Substance Use Topics  . Alcohol use: No  . Drug use: No     Allergies   Sulfonamide derivatives   Review of Systems Review of Systems  All other systems reviewed and are negative.    Physical Exam Updated Vital Signs BP 104/86   Pulse 71   Temp 98.3 F (36.8 C) (Oral)   Resp 16   SpO2 92%   Physical Exam Vitals signs and nursing note reviewed.  Constitutional:      General: She is not in acute distress.    Appearance: She is well-developed. She is not ill-appearing or toxic-appearing.  HENT:     Head: Normocephalic and atraumatic.     Right Ear: External ear normal.     Left Ear: External ear normal.  Eyes:     General: No scleral icterus.       Right eye: No discharge.        Left eye: No discharge.     Conjunctiva/sclera: Conjunctivae normal.  Neck:     Musculoskeletal: Neck supple.     Trachea: No tracheal deviation.  Cardiovascular:     Rate and Rhythm: Normal rate and regular rhythm.  Pulmonary:     Effort: Pulmonary effort is normal. No respiratory distress.     Breath sounds: Normal breath sounds. No stridor. No wheezing or rales.  Abdominal:     General: Bowel sounds are decreased. There is distension.     Tenderness: There is no abdominal tenderness. There is no guarding or rebound.     Hernia: No hernia is present.     Comments: Abdomen is distended and tense  Genitourinary:    Comments: Some soft brown stool in the rectal vault, no fecal impaction Musculoskeletal:        General: No tenderness.  Skin:    General: Skin is warm and dry.     Findings: No rash.    Neurological:     Cranial Nerves: No cranial nerve deficit (no facial droop, extraocular movements intact, no slurred speech).     Sensory: No sensory deficit.     Motor: No abnormal muscle tone or seizure activity.     Coordination: Coordination normal.      ED Treatments / Results  Labs (all labs ordered are listed, but only abnormal results are displayed) Labs Reviewed  COMPREHENSIVE METABOLIC PANEL - Abnormal;  Notable for the following components:      Result Value   Potassium 2.2 (*)    CO2 19 (*)    Glucose, Bld 155 (*)    BUN 76 (*)    Creatinine, Ser 3.13 (*)    Total Bilirubin 1.4 (*)    GFR calc non Af Amer 13 (*)    GFR calc Af Amer 16 (*)    Anion gap 16 (*)    All other components within normal limits  LIPASE, BLOOD - Abnormal; Notable for the following components:   Lipase 63 (*)    All other components within normal limits  CBC WITH DIFFERENTIAL/PLATELET - Abnormal; Notable for the following components:   WBC 18.1 (*)    RBC 5.79 (*)    HCT 47.9 (*)    MCH 25.7 (*)    Neutro Abs 15.4 (*)    Monocytes Absolute 1.4 (*)    Abs Immature Granulocytes 0.12 (*)    All other components within normal limits  MAGNESIUM - Abnormal; Notable for the following components:   Magnesium 2.8 (*)    All other components within normal limits  URINALYSIS, ROUTINE W REFLEX MICROSCOPIC    EKG None  Radiology Ct Abdomen Pelvis Wo Contrast  Result Date: 01/27/2018 CLINICAL DATA:  Abdominal distention. EXAM: CT ABDOMEN AND PELVIS WITHOUT CONTRAST TECHNIQUE: Multidetector CT imaging of the abdomen and pelvis was performed following the standard protocol without IV contrast. COMPARISON:  09/02/2013 FINDINGS: Lower chest: Atelectasis with subpleural consolidation noted within the right lower lobe. Hepatobiliary: No focal liver abnormality is seen. No gallstones, gallbladder wall thickening, or biliary dilatation. Pancreas: Unremarkable. No pancreatic ductal dilatation or  surrounding inflammatory changes. Spleen: Calcified splenic mass is again identified measuring 4.6 by 5.2 cm, image 35/2. Unchanged. Adrenals/Urinary Tract: Normal appearance of the adrenal glands. No hydronephrosis identified bilaterally. Intermediate attenuating nodule arising from inferior pole of the posterior cortex of right kidney is unchanged measuring 1 cm. This is incompletely characterized without IV contrast. Urinary bladder is unremarkable. Stomach/Bowel: Stomach appears collapsed. Small bowel loops are normal in course and caliber. There is marked diffuse gaseous distension of the colon up to the level of the rectum. A very large stool burden is identified within the rectum. Desiccated stool ball measures 16.7 x 9.8 by 23.0 cm. Moderate amount of stool identified within the ascending colon. Vascular/Lymphatic: Aortic atherosclerosis. No aneurysm. No abdominopelvic adenopathy. Reproductive: The uterus is not confidently identified and may be surgically absent no. No adnexal mass. Other: No free fluid or fluid collections identified. Musculoskeletal: . Mild scoliosis and multi level lumbar degenerative disc disease. IMPRESSION: 1. There is diffuse gaseous distension of the colon. A very large stool burden is identified within the rectum. Findings are favored to represent rectal impaction. 2. Unchanged calcified splenic mass compatible with a benign abnormality. 3.  Aortic Atherosclerosis (ICD10-I70.0). Electronically Signed   By: Signa Kellaylor  Stroud M.D.   On: 01/27/2018 14:36    Procedures .Critical Care Performed by: Linwood DibblesKnapp, Courtland Coppa, MD Authorized by: Linwood DibblesKnapp, Danita Proud, MD   Critical care provider statement:    Critical care time (minutes):  30   Critical care was time spent personally by me on the following activities:  Discussions with consultants, evaluation of patient's response to treatment, examination of patient, ordering and performing treatments and interventions, ordering and review of laboratory  studies, ordering and review of radiographic studies, pulse oximetry, re-evaluation of patient's condition, obtaining history from patient or surrogate and review of old charts   (  including critical care time)  Medications Ordered in ED Medications  sodium chloride 0.9 % bolus 500 mL (0 mLs Intravenous Stopped 01/27/18 1216)    And  0.9 %  sodium chloride infusion ( Intravenous Stopped 01/27/18 1435)  sodium phosphate (FLEET) 7-19 GM/118ML enema 1 enema (has no administration in time range)  piperacillin-tazobactam (ZOSYN) IVPB 3.375 g (has no administration in time range)  metroNIDAZOLE (FLAGYL) IVPB 500 mg (has no administration in time range)  sodium chloride 0.9 % bolus 500 mL (has no administration in time range)  ondansetron (ZOFRAN) injection 4 mg (4 mg Intravenous Given 01/27/18 1112)  potassium chloride 10 mEq in 100 mL IVPB (0 mEq Intravenous Stopped 01/27/18 1435)     Initial Impression / Assessment and Plan / ED Course  I have reviewed the triage vital signs and the nursing notes.  Pertinent labs & imaging results that were available during my care of the patient were reviewed by me and considered in my medical decision making (see chart for details).  Clinical Course as of Jan 28 1523  Tue Jan 27, 2018  1221 Labs reviewed.  Patient has leukocytosis and an acute kidney injury.  She also has hypokalemia and an elevated lipase   [JK]    Clinical Course User Index [JK] Linwood DibblesKnapp, Connie Hilgert, MD    Patient presented to the emergency room for evaluation of abdominal distention.  Patient did have reported nausea vomiting this morning.  Her laboratory tests are notable for an acute kidney injury.  She also has a significant leukocytosis.  Her exam is somewhat limited by her dementia but she does not seem to be having any significant pain.  Cannot completely exclude the possibility of infection so we will start her on empiric antibiotics.  CT scan shows rectal and colonic distention  associated with constipation.  CT suggests a fecal impaction although I did not appreciate one on my physical exam.  I have ordered an enema.  Have continue with IV hydration.  Empiric antibiotics have been ordered.  I will consult with the medical service for admission and further treatment.  I did discuss the findings with the patient's family.  Final Clinical Impressions(s) / ED Diagnoses   Final diagnoses:  AKI (acute kidney injury) (HCC)  Colon distention  Fecal impaction (HCC)      Linwood DibblesKnapp, Deane Wattenbarger, MD 01/27/18 1524

## 2018-01-27 NOTE — ED Notes (Signed)
Bladder Scan- 44ml

## 2018-01-27 NOTE — ED Notes (Signed)
Patient voided 50 mls.

## 2018-01-27 NOTE — ED Notes (Signed)
Critical value: 2.2 potassium Md notified.

## 2018-01-27 NOTE — ED Notes (Signed)
Patients 02 dropped to 87% on room. Patient asked for 02. Patient is 93% on 2 litters. MD made aware.

## 2018-01-27 NOTE — ED Notes (Signed)
Pure wick placed on patient. Fluids running. Waiting for urine sample.

## 2018-01-27 NOTE — ED Notes (Signed)
Bed: [id number] Expected date:  Expected time:  Means of arrival:  Comments: EMS abd constipation/distention/dementia

## 2018-01-28 ENCOUNTER — Observation Stay (HOSPITAL_COMMUNITY): Payer: Medicare Other

## 2018-01-28 DIAGNOSIS — R140 Abdominal distension (gaseous): Secondary | ICD-10-CM

## 2018-01-28 DIAGNOSIS — N179 Acute kidney failure, unspecified: Secondary | ICD-10-CM | POA: Diagnosis not present

## 2018-01-28 DIAGNOSIS — K6389 Other specified diseases of intestine: Secondary | ICD-10-CM

## 2018-01-28 DIAGNOSIS — K5641 Fecal impaction: Secondary | ICD-10-CM | POA: Diagnosis not present

## 2018-01-28 LAB — RENAL FUNCTION PANEL
Albumin: 3.6 g/dL (ref 3.5–5.0)
Anion gap: 15 (ref 5–15)
BUN: 62 mg/dL — ABNORMAL HIGH (ref 8–23)
CO2: 17 mmol/L — ABNORMAL LOW (ref 22–32)
Calcium: 8.9 mg/dL (ref 8.9–10.3)
Chloride: 116 mmol/L — ABNORMAL HIGH (ref 98–111)
Creatinine, Ser: 1.95 mg/dL — ABNORMAL HIGH (ref 0.44–1.00)
GFR calc Af Amer: 27 mL/min — ABNORMAL LOW (ref >60)
GFR calc non Af Amer: 23 mL/min — ABNORMAL LOW (ref >60)
Glucose, Bld: 114 mg/dL — ABNORMAL HIGH (ref 70–99)
Phosphorus: 3.5 mg/dL (ref 2.5–4.6)
Potassium: 3 mmol/L — ABNORMAL LOW (ref 3.5–5.1)
Sodium: 148 mmol/L — ABNORMAL HIGH (ref 135–145)

## 2018-01-28 LAB — MAGNESIUM: Magnesium: 2.7 mg/dL — ABNORMAL HIGH (ref 1.7–2.4)

## 2018-01-28 LAB — GLUCOSE, CAPILLARY: Glucose-Capillary: 110 mg/dL — ABNORMAL HIGH (ref 70–99)

## 2018-01-28 MED ORDER — POTASSIUM CHLORIDE 10 MEQ/100ML IV SOLN
10.0000 meq | INTRAVENOUS | Status: AC
  Administered 2018-01-28 – 2018-01-29 (×4): 10 meq via INTRAVENOUS
  Filled 2018-01-28 (×3): qty 100

## 2018-01-28 MED ORDER — SORBITOL 70 % SOLN
960.0000 mL | TOPICAL_OIL | Freq: Once | ORAL | Status: AC
  Administered 2018-01-28: 960 mL via RECTAL
  Filled 2018-01-28: qty 473

## 2018-01-28 MED ORDER — PIPERACILLIN-TAZOBACTAM 3.375 G IVPB
3.3750 g | Freq: Three times a day (TID) | INTRAVENOUS | Status: DC
  Administered 2018-01-28 – 2018-02-05 (×22): 3.375 g via INTRAVENOUS
  Filled 2018-01-28 (×23): qty 50

## 2018-01-28 NOTE — Progress Notes (Signed)
Able to get 600 cc of SMOG in patient.  Mostly medium brown thick liquid stool returned.

## 2018-01-28 NOTE — Plan of Care (Signed)
Patient continues with loose stool but no resolution of impacted stool per physician and x-ray.  Another enema to be given.

## 2018-01-28 NOTE — Progress Notes (Signed)
PROGRESS NOTE    [name]  VOZ:[id number] DOB: 05/18/1940 DOA: 01/27/2018 PCP: Nadara EatonPiazza, Michael J, MD  Outpatient Specialists:   Brief Narrative:  As per H and P "[name] KinsBarbara J Casalino is a 77 y.o. female with medical history significant of atrial fibrillation, chronic diastolic CHF, CAD, history of hysterectomy, hyperlipidemia, prediabetes, Parkinson's disease and dementia, history of CVA with right-sided weakness, history of heart murmur and other comorbidities who presents with abdominal distention, poor p.o. intake, as well as dehydration and inability to defecate.  Patient is unable to provide respiratory history due to her advanced dementia and Parkinson's disease and does answer simple questions yes and no but is unable to fully explain.  Currently stays at a skilled nursing facility and reportedly the staff there states that she has had poor p.o. intake and has been eating since Saturday and has not been drinking at all.  Patient's daughter is at bedside and provides most of the history and she states the patient ate Friday night.  There is noted abdominal distention today but daughter states that she noticed yesterday and mentioned to the nurse.  Is unclear when she has had a bowel movement or not.  Patient is unable to tell us when she is abdominal due to her underlying dementia.  Patient's family states that she is not been complaining of anything but CNA at the facility noticed that she had started vomiting on Sunday evening.  She is found to have severe abdominal distention and a CT of the abdomen pelvis was done which showed she is gaseous distention of the colon the very large stool burden identified within the rectum which favored a rectal impaction.  [facility] was called to admit this patient for abdominal distention and suspected rectal impaction and she started on a bowel regimen and given a suppository.  Additionally patient was found to have an AKI likely due to her poor p.o. intake.  ED  Course: In the ED she had basic blood work done as well as a CT of the abdomen pelvis.  She is also given a Fleet enema without much success.  EDP also give a 500 mL bolus and placed her on normal saline at a rate of 125 mL's per hour which I decreased down to 75 mL's per hour".  01/28/2018: Abdominal x-ray done today still reveals fecal impaction with dilated colon.  Acute kidney injury is resolving.  Potassium has gone up to 3.  We will continue to replete potassium.  Will repeat KUB in the morning.  Will repeat enema.   Assessment & Plan:   Active Problems:   Essential hypertension   Coronary atherosclerosis   Atrial fibrillation (HCC)   Diastolic CHF (HCC)   Ambulatory dysfunction   Parkinson disease (HCC)   Constipation   Dementia due to Parkinson's disease with behavioral disturbance (HCC)   Hypokalemia   AKI (acute kidney injury) (HCC)   Leukocytosis   Acute on chronic respiratory failure with hypoxia (HCC)   Hyperbilirubinemia   Abdominal distention   Abdominal Distention in the setting of Severe Constipation/Obstipation -Place in Obs Telemetry -Tried Fleet Enema without success -Placed on bowel regimen with MiraLAX 17 g p.o. twice daily, senna docusate 1 tab p.o. twice daily, and try smog enema -If not improving will maybe consider consultation to gastroenterology -Replete electrolytes and maintain potassium above 4.0 and magnesium above 2  01/28/2018: Kindly see above.  Hypokalemia -Patient's K+ on admission was severely low at 2.2 -Pleat with IV potassium 20 Norco  once in the ED and I have given additional 60.  I have also started the patient on normal saline +40 mEq of KCl at a rate of 75 mils per hour -Continue monitor replete as necessary -Check magnesium level -Repeat CMP in a.m. 01/28/2018: Kindly see above.  AKI -Check U/A and urinalysis -Obtain renal ultrasound -Bladder scan did not show very much however may be inaccurate so we will check renal  ultrasound to see if the patient has any hydronephrosis -Patient is doing well except creatinine on admission was 76/3.13 -Avoid nephrotoxic medications if possible and avoid hypotension -Continue monitor and trend renal function repeat CMP in a.m. 01/28/2018: Kindly see above.  AKI is resolving  Leukocytosis -? Reactive in the setting of Nausea,Vomiting and Dehydration and possible Aspiration -WBC was 18.1 and Neutrophil Count was 15.4 -Afebrile so do Not feel as if it is infectious mediated as she has no abdominal pain but did sound a little diminished  -Given IV Abx in the ED with IV Zosyn and Metronidazole; Will continue IV Zosyn -Continue to Monitor -Check Blood Cx x2 -Repeat CBC in AM  01/28/2018: Repeat CBC in the morning.  No CBC visualized today.  Nasal swab for MRSA.  Acute Respiratory Failure with Hypoxia; ? Aspiration -O2 Saturations dropped to 87% on Room Air -Placed on 2 Liters and O2 Sat's improved to 93% -CT Scan showed Atelectasis with subplerual consolidation noted within the RLL -Check SLP -Check CXR -C/w NPO and Aspiration Precautions -WBC was 18.1 and repeat in AM -Start Xopenex/Atrovent Scheduled  01/28/2018: Continue to assess.  Repeat chest x-ray after patient is fully hydrated.  Hyperbilirubinemia -Mild at 1.4 -Continue to Monitor and trend -Repeat CMP in AM   Dementia and Parkinsons -SLP as above -C/w NPO for now -Aspiration Precautions -Delirium Precautions  -C/w Quetiapine 1-2 tabs po qHS  Chronic Diastolic CHF -Continue to Monitor due to Volume Resuscitation -Strict I's/O's; Daily Weights -C/w Metoprolol 50 mg po BID   HTN -Hold Amlodipine-Benezapril 5-20 mg po Daily currently   Hx of A Fib/SVT -C/w Metoprolol 50 mg po BID -C/w Telemetry   CAD -C/w ASA and Metoprolol   DVT prophylaxis: Heparin 5,000 units sq q8h Code Status: DO NOT RESUSCITATE  Family Communication: Discussed with Daughter Disposition Plan: Anticipate D/C  back to SNF when Abdominal Distention is resolved and WBC trends down Consults called: None Admission status: Obs Telemetry  Procedures:   None  Antimicrobials:   IV Zosyn  Subjective: Patient is unable to provide any history.  Objective: Vitals:   01/28/18 0707 01/28/18 0734 01/28/18 1350 01/28/18 1427  BP: (!) 141/75   (!) 141/77  Pulse: 61   62  Resp: 18   20  Temp: (!) 97.5 F (36.4 C)   97.9 F (36.6 C)  TempSrc: Oral   Oral  SpO2: 95% 97% 98% 94%  Weight: 77 kg     Height: 5\' 2"  (1.575 m)       Intake/Output Summary (Last 24 hours) at 01/28/2018 1753 Last data filed at 01/28/2018 1400 Gross per 24 hour  Intake 3018.73 ml  Output -  Net 3018.73 ml   Filed Weights   01/27/18 2012 01/27/18 2158 01/28/18 0707  Weight: 77 kg 77 kg 77 kg    Examination:  General exam: Appears calm and comfortable. Ill looking. Respiratory system: Clear to auscultation.  Cardiovascular system: S1 & S2  Gastrointestinal system: Abdomen is distended and nontender.  Organs are difficult to assess.   Central nervous system:  Awake.  Patient will not follow full neurological exam.    Data Reviewed: I have personally reviewed following labs and imaging studies  CBC: Recent Labs  Lab 01/27/18 1110  WBC 18.1*  NEUTROABS 15.4*  HGB 14.9  HCT 47.9*  MCV 82.7  PLT 250   Basic Metabolic Panel: Recent Labs  Lab 01/27/18 1110 01/27/18 1925 01/28/18 1423  NA 141  --  148*  K 2.2*  --  3.0*  CL 106  --  116*  CO2 19*  --  17*  GLUCOSE 155*  --  114*  BUN 76*  --  62*  CREATININE 3.13*  --  1.95*  CALCIUM 9.5  --  8.9  MG 2.8*  --  2.7*  PHOS  --  5.1* 3.5   GFR: Estimated Creatinine Clearance: 22.5 mL/min (A) (by C-G formula based on SCr of 1.95 mg/dL (H)). Liver Function Tests: Recent Labs  Lab 01/27/18 1110 01/28/18 1423  AST 28  --   ALT 23  --   ALKPHOS 87  --   BILITOT 1.4*  --   PROT 7.7  --   ALBUMIN 4.4 3.6   Recent Labs  Lab 01/27/18 1110    LIPASE 63*   No results for input(s): AMMONIA in the last 168 hours. Coagulation Profile: No results for input(s): INR, PROTIME in the last 168 hours. Cardiac Enzymes: No results for input(s): CKTOTAL, CKMB, CKMBINDEX, TROPONINI in the last 168 hours. BNP (last 3 results) No results for input(s): PROBNP in the last 8760 hours. HbA1C: No results for input(s): HGBA1C in the last 72 hours. CBG: Recent Labs  Lab 01/28/18 0757  GLUCAP 110*   Lipid Profile: No results for input(s): CHOL, HDL, LDLCALC, TRIG, CHOLHDL, LDLDIRECT in the last 72 hours. Thyroid Function Tests: Recent Labs    01/27/18 1925  TSH 3.660   Anemia Panel: No results for input(s): VITAMINB12, FOLATE, FERRITIN, TIBC, IRON, RETICCTPCT in the last 72 hours. Urine analysis:    Component Value Date/Time   COLORURINE AMBER (A) 01/27/2018 1723   APPEARANCEUR CLOUDY (A) 01/27/2018 1723   LABSPEC 1.027 01/27/2018 1723   PHURINE 5.0 01/27/2018 1723   GLUCOSEU NEGATIVE 01/27/2018 1723   HGBUR NEGATIVE 01/27/2018 1723   BILIRUBINUR NEGATIVE 01/27/2018 1723   KETONESUR NEGATIVE 01/27/2018 1723   PROTEINUR 100 (A) 01/27/2018 1723   UROBILINOGEN 1.0 09/02/2013 1132   NITRITE NEGATIVE 01/27/2018 1723   LEUKOCYTESUR NEGATIVE 01/27/2018 1723   Sepsis Labs: @LABRCNTIP (procalcitonin:4,lacticidven:4)  )No results found for this or any previous visit (from the past 240 hour(s)).       Radiology Studies: Ct Abdomen Pelvis Wo Contrast  Result Date: 01/27/2018 CLINICAL DATA:  Abdominal distention. EXAM: CT ABDOMEN AND PELVIS WITHOUT CONTRAST TECHNIQUE: Multidetector CT imaging of the abdomen and pelvis was performed following the standard protocol without IV contrast. COMPARISON:  09/02/2013 FINDINGS: Lower chest: Atelectasis with subpleural consolidation noted within the right lower lobe. Hepatobiliary: No focal liver abnormality is seen. No gallstones, gallbladder wall thickening, or biliary dilatation. Pancreas:  Unremarkable. No pancreatic ductal dilatation or surrounding inflammatory changes. Spleen: Calcified splenic mass is again identified measuring 4.6 by 5.2 cm, image 35/2. Unchanged. Adrenals/Urinary Tract: Normal appearance of the adrenal glands. No hydronephrosis identified bilaterally. Intermediate attenuating nodule arising from inferior pole of the posterior cortex of right kidney is unchanged measuring 1 cm. This is incompletely characterized without IV contrast. Urinary bladder is unremarkable. Stomach/Bowel: Stomach appears collapsed. Small bowel loops are normal in course and caliber. There  is marked diffuse gaseous distension of the colon up to the level of the rectum. A very large stool burden is identified within the rectum. Desiccated stool ball measures 16.7 x 9.8 by 23.0 cm. Moderate amount of stool identified within the ascending colon. Vascular/Lymphatic: Aortic atherosclerosis. No aneurysm. No abdominopelvic adenopathy. Reproductive: The uterus is not confidently identified and may be surgically absent no. No adnexal mass. Other: No free fluid or fluid collections identified. Musculoskeletal: . Mild scoliosis and multi level lumbar degenerative disc disease. IMPRESSION: 1. There is diffuse gaseous distension of the colon. A very large stool burden is identified within the rectum. Findings are favored to represent rectal impaction. 2. Unchanged calcified splenic mass compatible with a benign abnormality. 3.  Aortic Atherosclerosis (ICD10-I70.0). Electronically Signed   By: Signa Kell M.D.   On: 01/27/2018 14:36   Dg Abd 1 View  Result Date: 01/28/2018 CLINICAL DATA:  Abdominal distension EXAM: ABDOMEN - 1 VIEW COMPARISON:  01/27/2018 CT FINDINGS: Two supine views. Compared to yesterday's scout view, similar diffuse colonic distension. Stool ball within the rectum, again consistent with fecal impaction. No pneumatosis or evidence of free intraperitoneal air. Calcified splenic mass as detailed  on CT, 5.3 cm. IMPRESSION: Similar diffuse colonic distension, likely related to fecal impaction. Electronically Signed   By: Jeronimo Greaves M.D.   On: 01/28/2018 07:17   US Renal  Result Date: 01/27/2018 CLINICAL DATA:  Acute kidney injury EXAM: RENAL / URINARY TRACT ULTRASOUND COMPLETE COMPARISON:  CT abdomen 01/27/2018 FINDINGS: Right Kidney: Renal measurements: 10.0 by 4.0 by 3.8 cm = volume: 79 mL. A 2.2 by 2.3 by 1.7 cm mildly complex right kidney lower pole lesion likely reflects the parapelvic cystic lesion shown on recent CT. The smaller exophytic 1.0 cm complex lesions shown on recent CT is not well seen sonographically. Accentuated renal echogenicity. Left Kidney: Renal measurements: 7.0 by 3.8 by 3.7 cm = volume: 51.5 mL. Accentuated renal echogenicity. Bladder: Nondistended urinary bladder, 8.1 by 2.1 by 10.1 cm (volume = 90 cm^3). IMPRESSION: 1. Echogenic kidneys potentially from chronic medical renal disease. Kidneys are small and atrophic. 2. Mildly complex suspected parapelvic cyst in the right kidney lower pole. A smaller 1.0 cm exophytic lesion from the right kidney lower pole shown on recent CT is not well seen by ultrasound. Electronically Signed   By: Gaylyn Rong M.D.   On: 01/27/2018 20:26   Dg Chest Port 1 View  Result Date: 01/27/2018 CLINICAL DATA:  Hypoxia EXAM: PORTABLE CHEST 1 VIEW COMPARISON:  09/03/2013 FINDINGS: Extremely low lung volumes. Lung volumes are decreased from comparison exam. Bibasilar atelectasis. Cardiac silhouette is difficult to evaluate. Patient rotated additionally. IMPRESSION: Extremely low lung volumes. Bibasilar atelectasis. No clear acute findings. Electronically Signed   By: Genevive Bi M.D.   On: 01/27/2018 19:49        Scheduled Meds: . aspirin EC  81 mg Oral Daily  . heparin  5,000 Units Subcutaneous Q8H  . ipratropium  0.5 mg Nebulization TID  . levalbuterol  1.25 mg Nebulization TID  . metoprolol tartrate  50 mg Oral BID  .  polyethylene glycol  17 g Oral BID  . senna-docusate  1 tablet Oral BID  . [START ON 02/18/2018] Vitamin D (Ergocalciferol)  50,000 Units Oral Q30 days   Continuous Infusions: . 0.9 % NaCl with KCl 40 mEq / L 75 mL/hr at 01/28/18 1400  . piperacillin-tazobactam (ZOSYN)  IV 2.25 g (01/28/18 1655)  . potassium chloride  LOS: 0 days    Time spent: 84 Minutes    Dana Allan, MD  [facility] Pager #: [phone] 7PM-7AM contact night coverage as above

## 2018-01-28 NOTE — Progress Notes (Signed)
Pharmacy: Re-zosyn  Patient's a 77 y.o F currently on zosyn for suspected asp PNA. Scr down 1.95 (crcl~22)  Plan: - will adjust zosyn dose to 3.375 gm IV q8h (infuse over 4 hr) for renal function.  Dorna LeitzAnh Camillo Quadros, PharmD, BCPS 01/28/2018 6:56 PM

## 2018-01-29 ENCOUNTER — Observation Stay (HOSPITAL_COMMUNITY): Payer: Medicare Other

## 2018-01-29 DIAGNOSIS — N179 Acute kidney failure, unspecified: Secondary | ICD-10-CM | POA: Diagnosis not present

## 2018-01-29 DIAGNOSIS — K5641 Fecal impaction: Secondary | ICD-10-CM | POA: Diagnosis not present

## 2018-01-29 DIAGNOSIS — K6389 Other specified diseases of intestine: Secondary | ICD-10-CM | POA: Diagnosis not present

## 2018-01-29 LAB — RENAL FUNCTION PANEL
Albumin: 3.4 g/dL — ABNORMAL LOW (ref 3.5–5.0)
Albumin: 3.4 g/dL — ABNORMAL LOW (ref 3.5–5.0)
Anion gap: 10 (ref 5–15)
Anion gap: 11 (ref 5–15)
BUN: 43 mg/dL — ABNORMAL HIGH (ref 8–23)
BUN: 43 mg/dL — ABNORMAL HIGH (ref 8–23)
CO2: 18 mmol/L — ABNORMAL LOW (ref 22–32)
CO2: 18 mmol/L — ABNORMAL LOW (ref 22–32)
Calcium: 8.7 mg/dL — ABNORMAL LOW (ref 8.9–10.3)
Calcium: 8.7 mg/dL — ABNORMAL LOW (ref 8.9–10.3)
Chloride: 121 mmol/L — ABNORMAL HIGH (ref 98–111)
Chloride: 121 mmol/L — ABNORMAL HIGH (ref 98–111)
Creatinine, Ser: 1.39 mg/dL — ABNORMAL HIGH (ref 0.44–1.00)
Creatinine, Ser: 1.42 mg/dL — ABNORMAL HIGH (ref 0.44–1.00)
GFR calc Af Amer: 41 mL/min — ABNORMAL LOW (ref >60)
GFR calc Af Amer: 42 mL/min — ABNORMAL LOW (ref >60)
GFR calc non Af Amer: 35 mL/min — ABNORMAL LOW (ref >60)
GFR calc non Af Amer: 36 mL/min — ABNORMAL LOW (ref >60)
Glucose, Bld: 109 mg/dL — ABNORMAL HIGH (ref 70–99)
Glucose, Bld: 111 mg/dL — ABNORMAL HIGH (ref 70–99)
Phosphorus: 1.6 mg/dL — ABNORMAL LOW (ref 2.5–4.6)
Phosphorus: 1.7 mg/dL — ABNORMAL LOW (ref 2.5–4.6)
Potassium: 3.4 mmol/L — ABNORMAL LOW (ref 3.5–5.1)
Potassium: 3.4 mmol/L — ABNORMAL LOW (ref 3.5–5.1)
Sodium: 149 mmol/L — ABNORMAL HIGH (ref 135–145)
Sodium: 150 mmol/L — ABNORMAL HIGH (ref 135–145)

## 2018-01-29 LAB — CBC WITH DIFFERENTIAL/PLATELET
Abs Immature Granulocytes: 0.18 10*3/uL — ABNORMAL HIGH (ref 0.00–0.07)
Basophils Absolute: 0 10*3/uL (ref 0.0–0.1)
Basophils Relative: 0 %
Eosinophils Absolute: 0 10*3/uL (ref 0.0–0.5)
Eosinophils Relative: 0 %
HCT: 47.8 % — ABNORMAL HIGH (ref 36.0–46.0)
Hemoglobin: 14.8 g/dL (ref 12.0–15.0)
Immature Granulocytes: 1 %
Lymphocytes Relative: 6 %
Lymphs Abs: 0.8 10*3/uL (ref 0.7–4.0)
MCH: 25.4 pg — ABNORMAL LOW (ref 26.0–34.0)
MCHC: 31 g/dL (ref 30.0–36.0)
MCV: 82 fL (ref 80.0–100.0)
Monocytes Absolute: 1.1 10*3/uL — ABNORMAL HIGH (ref 0.1–1.0)
Monocytes Relative: 8 %
Neutro Abs: 11.3 10*3/uL — ABNORMAL HIGH (ref 1.7–7.7)
Neutrophils Relative %: 85 %
Platelets: 194 10*3/uL (ref 150–400)
RBC: 5.83 MIL/uL — ABNORMAL HIGH (ref 3.87–5.11)
RDW: 15.3 % (ref 11.5–15.5)
WBC: 13.4 10*3/uL — ABNORMAL HIGH (ref 4.0–10.5)
nRBC: 0 % (ref 0.0–0.2)

## 2018-01-29 LAB — MRSA PCR SCREENING: MRSA by PCR: NEGATIVE

## 2018-01-29 LAB — URINE CULTURE

## 2018-01-29 LAB — GLUCOSE, CAPILLARY: Glucose-Capillary: 103 mg/dL — ABNORMAL HIGH (ref 70–99)

## 2018-01-29 LAB — MAGNESIUM: Magnesium: 2.7 mg/dL — ABNORMAL HIGH (ref 1.7–2.4)

## 2018-01-29 MED ORDER — SORBITOL 70 % SOLN
960.0000 mL | TOPICAL_OIL | Freq: Every day | ORAL | Status: DC
  Administered 2018-01-29 – 2018-02-01 (×4): 960 mL via RECTAL
  Filled 2018-01-29 (×6): qty 473

## 2018-01-29 MED ORDER — LEVALBUTEROL HCL 1.25 MG/0.5ML IN NEBU: 1.2500 mg | INHALATION_SOLUTION | Freq: Four times a day (QID) | RESPIRATORY_TRACT | Status: DC | PRN

## 2018-01-29 MED ORDER — POTASSIUM CHLORIDE 10 MEQ/100ML IV SOLN
INTRAVENOUS | Status: AC
  Filled 2018-01-29: qty 100

## 2018-01-29 NOTE — Consult Note (Signed)
[facility] Gastroenterology Consultation Note  Referring Provider:  Dr. Dartha Lodge [facility]) Primary Care Physician:  Nadara Eaton, MD  Reason for Consultation:  Constipation, fecal impaction  HPI: [name] is a 77 y.o. female with history Parkinson's, atrial fibrillation, heart failure, cardiac disease.  Patient is immobile (bed- or wheelchair-bound), resides at nursing facility.  Patient is unable to provide much history, most of which is provided by patient's  Over the past several days, patient has had troubles with abdominal distention and nausea and vomiting (some of which sounds feculent).  Has not had bowel movement in about one week.  Had previously been taking senokot and prior reports of chronically loose-type stools.  No reported blood in stool.   Past Medical History:  Diagnosis Date  . Anxiety disorder   . Atrial fibrillation (HCC)    a. new 04/2011;  b. Pradaxa  . Chronic diastolic heart failure (HCC)    Echocardiogram 04/14/11: Moderate LVH, EF 60-65%, mild LAE.  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 Gastritis   . H/O: hysterectomy   . Heart murmur    from age 1 associated with some hemoptysis  . Hypercholesteremia   . Hypertension   . Impaired glucose tolerance   . Obesity   . Osteoarthritis   . Parkinson's disease (HCC)    patient denies having parkinsons  . Stroke [facility]) 2007   Residual R sided weakness, uses a wheelchair, walker at home    Past Surgical History:  Procedure Laterality Date  . CARPAL TUNNEL RELEASE     right  . 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 LEFT HEART CATHETERIZATION WITH CORONARY ANGIOGRAM N/A 05/03/2011   Procedure: LEFT HEART CATHETERIZATION WITH CORONARY ANGIOGRAM;  Surgeon: Herby Abraham, MD;  Location: [facility] CATH LAB;  Service: Cardiovascular;  Laterality: N/A;  . REPLACEMENT TOTAL KNEE     right  . TONSILLECTOMY       Prior to Admission medications   Medication Sig Start Date End Date Taking? Authorizing Provider  amLODipine-benazepril (LOTREL) 5-20 MG per capsule Take 1 capsule by mouth daily.   Yes [provider]  aspirin EC 81 MG tablet Take 1 tablet (81 mg total) by mouth daily. 09/04/13  Yes Ghimire, Werner Lean, MD  Cyanocobalamin (VITAMIN B 12 PO) Take 1 tablet by mouth daily.   Yes [provider]  Hydrocortisone (GERHARDT'S BUTT CREAM) CREA Apply 1 application topically daily as needed for irritation.   Yes [provider]  metoprolol (LOPRESSOR) 50 MG tablet Take 50 mg by mouth 2 (two) times daily.   Yes [provider]  MYRBETRIQ 25 MG TB24 tablet  02/17/15  Yes [provider]  polyethylene glycol (MIRALAX) packet Take 17 g by mouth daily. 09/02/13  Yes Horton, Mayer Masker, MD  Vitamin D, Ergocalciferol, (DRISDOL) 1.25 MG (50000 UT) CAPS capsule Take 50,000 Units by mouth every 30 (thirty) days. 15th   Yes [provider]  cefTRIAXone (ROCEPHIN) 1 g injection  02/18/15   [provider]  QUEtiapine (SEROQUEL) 25 MG tablet 1-2 tabs qhs Patient not taking: Reported on 01/27/2018 04/01/14   Levert Feinstein, MD    Current Facility-Administered Medications  Medication Dose Route Frequency Provider Last Rate Last Dose  . 0.9 % NaCl with KCl 40 mEq / L  infusion   Intravenous Continuous Marguerita Merles Chapman,  DO 75 mL/hr at 01/29/18 1400 75 mL/hr at 01/29/18 1400  . acetaminophen (TYLENOL) tablet 650 mg  650 mg Oral Q6H PRN Marguerita MerlesSheikh, Omair Latif, DO       Or  . acetaminophen (TYLENOL) suppository 650 mg  650 mg Rectal Q6H PRN Marguerita MerlesSheikh, Omair Latif, DO      . aspirin EC tablet 81 mg  81 mg Oral Daily Marguerita MerlesSheikh, Omair Latif, DO   81 mg at 01/29/18 1139  . Gerhardt's butt cream 1 application  1 application Topical Daily PRN Marguerita MerlesSheikh, Omair Latif, DO      . heparin injection 5,000 Units  5,000 Units Subcutaneous [address]Q8H Sheikh, Omair [place], [place]   5,000 Units at  01/29/18 1436  . ipratropium (ATROVENT) nebulizer solution 0.5 mg  0.5 mg Nebulization TID Marguerita MerlesSheikh, Omair Latif, DO   0.5 mg at 01/29/18 1325  . levalbuterol (XOPENEX) nebulizer solution 1.25 mg  1.25 mg Nebulization TID Marguerita MerlesSheikh, Omair Latif, DO   1.25 mg at 01/29/18 1325  . metoprolol tartrate (LOPRESSOR) tablet 50 mg  50 mg Oral BID Marguerita MerlesSheikh, Omair [place], DO   50 mg at 01/29/18 1139  . ondansetron (ZOFRAN) tablet 4 mg  4 mg Oral Q6H PRN Marguerita MerlesSheikh, Omair Latif, DO       Or  . ondansetron [employer](ZOFRAN) injection 4 mg  4 mg Intravenous Q6H PRN Sheikh, Omair Latif, DO      . piperacillin-tazobactam (ZOSYN) IVPB 3.375 g  3.375 g Intravenous Q8H Pham, Anh P, RPH 12.5 mL/hr at 01/29/18 1434 3.375 g at 01/29/18 1434  . polyethylene glycol (MIRALAX / GLYCOLAX) packet 17 g  17 g Oral BID Marguerita MerlesSheikh, Omair Latif, DO   17 g at 01/29/18 1100  . senna-docusate (Senokot-S) tablet 1 tablet  1 tablet Oral BID Marguerita MerlesSheikh, Omair [place] Walk[place]if, DO   1 tablet at 01/29/18 1139  . [START ON 02/18/2018] Vitamin D (Ergocalciferol) (DRISDOL) capsule 50,000 Units  50,000 Units Oral Q30 days Marguerita MerlesSheikh, Omair [place], DO        Allergies as of 01/27/2018 - Review Complete 01/27/2018  Allergen Reaction Noted  . Sulfonamide derivatives Other (See Comments) 10/15/2008    Family History  Problem Relation Age of Onset  . Cancer Mother 988       died  . Heart attack Father 862       died    Social History   Socioeconomic History  . Marital status: Widowed    Spouse name: Not on file  . Number of children: 2  . Years of education: college  . Highest education level: Not on file  Occupational History  . Occupation: retired  Engineer, productionocial Needs  . Financial resource strain: Not on file  . Food insecurity:    Worry: Not on file    Inability: Not on file  . Transportation needs:    Medical: Not on file    Non-medical: Not on file  Tobacco Use  . Smoking status: Never Smoker  . Smokeless tobacco: Never Used  Substance and Sexual Activity  . Alcohol  use: No  . Drug use: No  . Sexual activity: Not Currently  Lifestyle  . Physical activity:    Days per week: Not on file    Minutes per session: Not on file  . Stress: Not on file  Relationships  . Social connections:    Talks on phone: Not on file    Gets together: Not on file    Attends religious service: Not on file    Active member  of club or organization: Not on file    Attends meetings of clubs or organizations: Not on file    Relationship status: Not on file  . Intimate partner violence:    Fear of current or ex partner: Not on file    Emotionally abused: Not on file    Physically abused: Not on file    Forced sexual activity: Not on file  Other Topics Concern  . Not on file  Social History Narrative   Lives in [place] with son.  Patient is in assisted living right now [facility]  Widow.  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    Review of Systems: Patient unable to provide history.  Physical Exam: Vital signs in last 24 hours: Temp:  [97.6 F (36.4 C)-98.3 F (36.8 C)] 97.7 F (36.5 C) (12/26 1327) Pulse Rate:  [56-69] 56 (12/26 1327) Resp:  [16-18] 16 (12/26 1327) BP: (137-158)/(77-95) 137/77 (12/26 1327) SpO2:  [95 %-99 %] 96 % (12/26 1327) Weight:  [75 kg] 75 kg (12/26 0500) Last BM Date: 01/29/18 General:   Alert, unable to provide much history, in NAD Head:  Normocephalic and atraumatic. Eyes:  Sclera clear, no icterus. Left eye anomaly,  Conjunctiva pink. Ears:  Normal auditory acuity. Nose:  No deformity, discharge,  or lesions. Mouth:  No deformity or lesions.  Oropharynx pink & moist. Neck:  Supple; no masses or thyromegaly. Lungs:  Clear throughout to auscultation.   No wheezes, crackles, or rhonchi. No acute distress. Heart:  Regular rate and rhythm; no murmurs, clicks, rubs,  or gallops. Abdomen:  Moderate distention and tympany; mild tenderness without peritonitis. No  masses, hepatosplenomegaly or hernias noted. Normal bowel sounds, without guarding, and without rebound.     Rectal:  Mild to moderate anal stenosis; no palpable stool ball; voluminous dark pudding consistency stool output during rectal exam Msk:  Symmetrical without gross deformities. Normal posture. Pulses:  Normal pulses noted. Extremities:  Without clubbing or edema. Neurologic:  Confused, can't answer most questions; doesn't follow instructions, diffusely weak Skin:  Intact without significant lesions or rashes. Psych:  Alert and cooperative. Normal mood and affect.   Lab Results: Recent Labs    01/27/18 1110 01/29/18 0408  WBC 18.1* 13.4*  HGB 14.9 14.8  HCT 47.9* 47.8*  PLT 250 194   BMET Recent Labs    01/27/18 1110 01/28/18 1423 01/29/18 0408  NA 141 148* 150*  149*  K 2.2* 3.0* 3.4*  3.4*  CL 106 116* 121*  121*  CO2 19* 17* 18*  18*  GLUCOSE 155* 114* 111*  109*  BUN 76* 62* 43*  43*  CREATININE 3.13* 1.95* 1.42*  1.39*  CALCIUM 9.5 8.9 8.7*  8.7*   LFT Recent Labs    01/27/18 1110  01/29/18 0408  PROT 7.7  --   --   ALBUMIN 4.4   < > 3.4*  3.4*  AST 28  --   --   ALT 23  --   --   ALKPHOS 87  --   --   BILITOT 1.4*  --   --    < > = values in this interval not displayed.   PT/INR No results for input(s): LABPROT, INR in the last 72 hours.  Studies/Results: Dg Abd 1 View  Result Date: 01/29/2018 CLINICAL DATA:  Follow-up fecal impaction. EXAM: ABDOMEN - 1 VIEW COMPARISON:  Abdominal radiograph performed  01/28/2018 FINDINGS: The sigmoid colon and rectum remain diffusely distended with stool, compatible with fecal impaction, though this is difficult to fully characterize on radiograph. There is persistent diffuse distention of small and large bowel loops, reflecting associated ileus. Mild degenerative change is noted along the lumbar spine. Right basilar airspace opacity raises concern for pneumonia. IMPRESSION: 1. The sigmoid colon and rectum  remain diffusely distended with stool, compatible with fecal impaction. Underlying diffuse distention of small and large bowel loops. 2. Right basilar airspace opacity raises concern for pneumonia. Electronically Signed   By: Roanna RaiderJeffery  Chang M.D.   On: 01/29/2018 06:22   Dg Abd 1 View  Result Date: 01/28/2018 CLINICAL DATA:  Abdominal distension EXAM: ABDOMEN - 1 VIEW COMPARISON:  01/27/2018 CT FINDINGS: Two supine views. Compared to yesterday's scout view, similar diffuse colonic distension. Stool ball within the rectum, again consistent with fecal impaction. No pneumatosis or evidence of free intraperitoneal air. Calcified splenic mass as detailed on CT, 5.3 cm. IMPRESSION: Similar diffuse colonic distension, likely related to fecal impaction. Electronically Signed   By: Jeronimo GreavesKyle  Talbot M.D.   On: 01/28/2018 07:17   [place] Renal  Result Date: 01/27/2018 CLINICAL DATA:  Acute kidney injury EXAM: RENAL / URINARY TRACT ULTRASOUND COMPLETE COMPARISON:  CT abdomen 01/27/2018 FINDINGS: Right Kidney: Renal measurements: 10.0 by 4.0 by 3.8 cm = volume: 79 mL. A 2.2 by 2.3 by 1.7 cm mildly complex right kidney lower pole lesion likely reflects the parapelvic cystic lesion shown on recent CT. The smaller exophytic 1.0 cm complex lesions shown on recent CT is not well seen sonographically. Accentuated renal echogenicity. Left Kidney: Renal measurements: 7.0 by 3.8 by 3.7 cm = volume: 51.5 mL. Accentuated renal echogenicity. Bladder: Nondistended urinary bladder, 8.1 by 2.1 by 10.1 cm (volume = 90 cm^3). IMPRESSION: 1. Echogenic kidneys potentially from chronic medical renal disease. Kidneys are small and atrophic. 2. Mildly complex suspected parapelvic cyst in the right kidney lower pole. A smaller 1.0 cm exophytic lesion from the right kidney lower pole shown on recent CT is not well seen by ultrasound. Electronically Signed   By: Gaylyn RongWalter  Liebkemann M.D.   On: 01/27/2018 20:26   Dg Chest Port 1 View  Result Date:  01/27/2018 CLINICAL DATA:  Hypoxia EXAM: PORTABLE CHEST 1 VIEW COMPARISON:  09/03/2013 FINDINGS: Extremely low lung volumes. Lung volumes are decreased from comparison exam. Bibasilar atelectasis. Cardiac silhouette is difficult to evaluate. Patient rotated additionally. IMPRESSION: Extremely low lung volumes. Bibasilar atelectasis. No clear acute findings. Electronically Signed   By: Genevive BiStewart  Edmunds M.D.   On: 01/27/2018 19:49   Impression:  1.  Constipation, obstipation with fecal impaction in rectosigmoid colon with upstream colonic distention.  Likely multifactorial (Parkinson's, poor mobility).   2.  Anal stenosis.  This might be contributing to her difficulty in fecal output, as there was voluminous pudding-consistency stool output during rectal exam (which essentially transiently relieved her degree of anal stenosis). 3.  Multiple medical problems.  Plan:  1.  Would continue SMOG enemas daily.  It would appear that slow progress is being made. 2.  Might see if nursing would consider daily rectal exams with gentle abdominal massage, this may help facilitate further stool and flatus passage. 3.  Would be reluctant to place rectal tube at this time, as I suspect her stools will become more and more solid, and I suspect the tube would get clogged. 4.  If the progress I've seen today doesn't continue, could consider sigmoidoscopy for further evaluation, though  as I've told patient/family/nursing, I've never seen sigmoidoscopy successful in relief of large rectosigmoid fecal impaction. 5.  [facility] will follow.   LOS: 0 days   Milayna Rotenberg M  01/29/2018, 3:00 PM  Cell [phone] If no answer or after 5 PM call [phone]

## 2018-01-29 NOTE — Care Management Note (Signed)
Case Management Note  Patient Details  Name: [name] MRN: [id number] Date of Birth: 03/16/1940  Subjective/Objective:                    Action/Plan:[name] Plan to return when stable.    Expected Discharge Date:  (unknown)               Expected Discharge Plan:  Skilled Nursing Facility  In-House Referral:  Clinical Social Work  Discharge planning Services  CM Consult  Post Acute Care Choice:    Choice offered to:     DME Arranged:    DME Agency:     HH Arranged:    HH Agency:     Status of Service:  In process, will continue to follow  If discussed at Long Length of Stay Meetings, dates discussed:    Additional CommentsGeni Bers:  [name], RN 01/29/2018, 3:33 PM

## 2018-01-29 NOTE — Progress Notes (Signed)
Tried to do manual disimpaction of rectal impaction like care order suggested. Could not feel any formed stool, only loose. Pt had 2 large loose BMs this shift. And was told by dayshift that there was so much it had leaked out the bottom of the bed and onto the floor.  Lenox PondsJerry L Anshul Meddings, RN

## 2018-01-29 NOTE — Care Management Obs Status (Signed)
MEDICARE OBSERVATION STATUS NOTIFICATION   Patient Details  Name: [name] MRN: [id number] Date of Birth: 09/09/1940   Medicare Observation Status Notification Given:  Other (see comment)Pt unable to sign spoke with pt's daughter Channel via telephone.    Geni BersMcGibboney, Nahomy Limburg, RN 01/29/2018, 3:31 PM

## 2018-01-29 NOTE — Progress Notes (Signed)
PROGRESS NOTE    [name]  WUJ:[id number] DOB: 14-Mar-1940 DOA: 01/27/2018 PCP: Nadara Eaton, MD  Outpatient Specialists:   Brief Narrative:  As per H and P "[name] is a 77 y.o. female with medical history significant of atrial fibrillation, chronic diastolic CHF, CAD, history of hysterectomy, hyperlipidemia, prediabetes, Parkinson's disease and dementia, history of CVA with right-sided weakness, history of heart murmur and other comorbidities who presents with abdominal distention, poor p.o. intake, as well as dehydration and inability to defecate.  Patient is unable to provide respiratory history due to her advanced dementia and Parkinson's disease and does answer simple questions yes and no but is unable to fully explain.  Currently stays at a skilled nursing facility and reportedly the staff there states that she has had poor p.o. intake and has been eating since Saturday and has not been drinking at all.  Patient's daughter is at bedside and provides most of the history and she states the patient ate Friday night.  There is noted abdominal distention today but daughter states that she noticed yesterday and mentioned to the nurse.  Is unclear when she has had a bowel movement or not.  Patient is unable to tell us when she is abdominal due to her underlying dementia.  Patient's family states that she is not been complaining of anything but CNA at the facility noticed that she had started vomiting on Sunday evening.  She is found to have severe abdominal distention and a CT of the abdomen pelvis was done which showed she is gaseous distention of the colon the very large stool burden identified within the rectum which favored a rectal impaction.  [facility] was called to admit this patient for abdominal distention and suspected rectal impaction and she started on a bowel regimen and given a suppository.  Additionally patient was found to have an AKI likely due to her poor p.o. intake.  ED  Course: In the ED she had basic blood work done as well as a CT of the abdomen pelvis.  She is also given a Fleet enema without much success.  EDP also give a 500 mL bolus and placed her on normal saline at a rate of 125 mL's per hour which I decreased down to 75 mL's per hour".  01/28/2018: Abdominal x-ray done today still reveals fecal impaction with dilated colon.  Acute kidney injury is resolving.  Potassium has gone up to 3.  We will continue to replete potassium.  Will repeat KUB in the morning.  Will repeat enema.  01/29/2018:  Repeat Abd X Ray continues to repeat rectal impaction and colonic dilatation.   Assessment & Plan:   Active Problems:   Essential hypertension   Coronary atherosclerosis   Atrial fibrillation (HCC)   Diastolic CHF (HCC)   Ambulatory dysfunction   Parkinson disease (HCC)   Constipation   Dementia due to Parkinson's disease with behavioral disturbance (HCC)   Hypokalemia   AKI (acute kidney injury) (HCC)   Leukocytosis   Acute on chronic respiratory failure with hypoxia (HCC)   Hyperbilirubinemia   Abdominal distention   Abdominal Distention in the setting of Severe Constipation/Obstipation -Place in Obs Telemetry -Tried Fleet Enema without success -Placed on bowel regimen with MiraLAX 17 g p.o. twice daily, senna docusate 1 tab p.o. twice daily, and try smog enema -If not improving will maybe consider consultation to gastroenterology -Replete electrolytes and maintain potassium above 4.0 and magnesium above 2  01/29/2018: GI consult called. Continue enema.  Trial of manual disimpaction was unsuccessful.  Hypokalemia -Patient's K+ on admission was severely low at 2.2 -Pleat with IV potassium 20 Norco once in the ED and I have given additional 60.  I have also started the patient on normal saline +40 mEq of KCl at a rate of 75 mils per hour -Continue monitor replete as necessary -Check magnesium level -Repeat CMP in a.m. 01/27/2018: Potassium is  3.4 today.  AKI -Check U/A and urinalysis -Obtain renal ultrasound -Bladder scan did not show very much however may be inaccurate so we will check renal ultrasound to see if the patient has any hydronephrosis -Patient is doing well except creatinine on admission was 76/3.13 -Avoid nephrotoxic medications if possible and avoid hypotension -Continue monitor and trend renal function repeat CMP in a.m. 01/29/2018: AKI is resolving  Leukocytosis -? Reactive in the setting of Nausea,Vomiting and Dehydration and possible Aspiration -WBC was 18.1 and Neutrophil Count was 15.4 -Afebrile so do Not feel as if it is infectious mediated as she has no abdominal pain but did sound a little diminished  -Given IV Abx in the ED with IV Zosyn and Metronidazole; Will continue IV Zosyn -Continue to Monitor -Check Blood Cx x2 -Repeat CBC in AM  01/28/2018: Repeat CBC in the morning.  No CBC visualized today.  Nasal swab for MRSA. 01/29/18: Resolving.   Acute Respiratory Failure with Hypoxia; ? Aspiration -O2 Saturations dropped to 87% on Room Air -Placed on 2 Liters and O2 Sat's improved to 93% -CT Scan showed Atelectasis with subplerual consolidation noted within the RLL -Check SLP -Check CXR -C/w NPO and Aspiration Precautions -WBC was 18.1 and repeat in AM -Start Xopenex/Atrovent Scheduled  01/28/2018: Continue to assess.  Repeat chest x-ray after patient is fully hydrated. 01/29/18: Chest x-ray in the morning.  Hyperbilirubinemia -Mild at 1.4 -Continue to Monitor and trend -Repeat CMP in AM   Dementia and Parkinsons -SLP as above -C/w NPO for now -Aspiration Precautions -Delirium Precautions  -C/w Quetiapine 1-2 tabs po qHS  Chronic Diastolic CHF -Continue to Monitor due to Volume Resuscitation -Strict I's/O's; Daily Weights -C/w Metoprolol 50 mg po BID   HTN -Hold Amlodipine-Benezapril 5-20 mg po Daily currently   Hx of A Fib/SVT -C/w Metoprolol 50 mg po BID -C/w  Telemetry   CAD -C/w ASA and Metoprolol   DVT prophylaxis: Heparin 5,000 units sq q8h Code Status: DO NOT RESUSCITATE  Family Communication: Discussed with Daughter Disposition Plan: Anticipate D/C back to SNF when Abdominal Distention is resolved and WBC trends down Consults called: None Admission status: Obs Telemetry  Procedures:   None  Antimicrobials:   IV Zosyn  Subjective: Patient is unable to provide any history. Patient is more interactive today.  Objective: Vitals:   01/29/18 0538 01/29/18 0724 01/29/18 1325 01/29/18 1327  BP: (!) 158/95   137/77  Pulse: 61   (!) 56  Resp: 16   16  Temp: 97.6 F (36.4 C)   97.7 F (36.5 C)  TempSrc: Oral   Oral  SpO2: 99% 97% 96% 96%  Weight:      Height:        Intake/Output Summary (Last 24 hours) at 01/29/2018 1644 Last data filed at 01/29/2018 1328 Gross per 24 hour  Intake 1058.98 ml  Output -  Net 1058.98 ml   Filed Weights   01/27/18 2158 01/28/18 0707 01/29/18 0500  Weight: 77 kg 77 kg 75 kg    Examination:  General exam: Appears calm and comfortable. Ill looking. Respiratory  system: Clear to auscultation.  Cardiovascular system: S1 & S2  Gastrointestinal system: Abdomen is distended and nontender.  Organs are difficult to assess.   Central nervous system: Awake, more interactive.  Data Reviewed: I have personally reviewed following labs and imaging studies  CBC: Recent Labs  Lab 01/27/18 1110 01/29/18 0408  WBC 18.1* 13.4*  NEUTROABS 15.4* 11.3*  HGB 14.9 14.8  HCT 47.9* 47.8*  MCV 82.7 82.0  PLT 250 194   Basic Metabolic Panel: Recent Labs  Lab 01/27/18 1110 01/27/18 1925 01/28/18 1423 01/29/18 0408  NA 141  --  148* 150*  149*  K 2.2*  --  3.0* 3.4*  3.4*  CL 106  --  116* 121*  121*  CO2 19*  --  17* 18*  18*  GLUCOSE 155*  --  114* 111*  109*  BUN 76*  --  62* 43*  43*  CREATININE 3.13*  --  1.95* 1.42*  1.39*  CALCIUM 9.5  --  8.9 8.7*  8.7*  MG 2.8*  --  2.7*  2.7*  PHOS  --  5.1* 3.5 1.7*  1.6*   GFR: Estimated Creatinine Clearance: 31.1 mL/min (A) (by C-G formula based on SCr of 1.39 mg/dL (H)). Liver Function Tests: Recent Labs  Lab 01/27/18 1110 01/28/18 1423 01/29/18 0408  AST 28  --   --   ALT 23  --   --   ALKPHOS 87  --   --   BILITOT 1.4*  --   --   PROT 7.7  --   --   ALBUMIN 4.4 3.6 3.4*  3.4*   Recent Labs  Lab 01/27/18 1110  LIPASE 63*   No results for input(s): AMMONIA in the last 168 hours. Coagulation Profile: No results for input(s): INR, PROTIME in the last 168 hours. Cardiac Enzymes: No results for input(s): CKTOTAL, CKMB, CKMBINDEX, TROPONINI in the last 168 hours. BNP (last 3 results) No results for input(s): PROBNP in the last 8760 hours. HbA1C: No results for input(s): HGBA1C in the last 72 hours. CBG: Recent Labs  Lab 01/28/18 0757 01/29/18 0756  GLUCAP 110* 103*   Lipid Profile: No results for input(s): CHOL, HDL, LDLCALC, TRIG, CHOLHDL, LDLDIRECT in the last 72 hours. Thyroid Function Tests: Recent Labs    01/27/18 1925  TSH 3.660   Anemia Panel: No results for input(s): VITAMINB12, FOLATE, FERRITIN, TIBC, IRON, RETICCTPCT in the last 72 hours. Urine analysis:    Component Value Date/Time   COLORURINE AMBER (A) 01/27/2018 1723   APPEARANCEUR CLOUDY (A) 01/27/2018 1723   LABSPEC 1.027 01/27/2018 1723   PHURINE 5.0 01/27/2018 1723   GLUCOSEU NEGATIVE 01/27/2018 1723   HGBUR NEGATIVE 01/27/2018 1723   BILIRUBINUR NEGATIVE 01/27/2018 1723   KETONESUR NEGATIVE 01/27/2018 1723   PROTEINUR 100 (A) 01/27/2018 1723   UROBILINOGEN 1.0 09/02/2013 1132   NITRITE NEGATIVE 01/27/2018 1723   LEUKOCYTESUR NEGATIVE 01/27/2018 1723   Sepsis Labs: @LABRCNTIP (procalcitonin:4,lacticidven:4)  ) Recent Results (from the past 240 hour(s))  Culture, Urine     Status: None   Collection Time: 01/27/18  5:23 PM  Result Value Ref Range Status   Specimen Description   Final    URINE, RANDOM Performed  at [facility], [address] [place], [place] [address]    Special Requests   Final    NONE Performed at [facility], [address] [place], [place] [address]    Culture   Final    Multiple  bacterial morphotypes present, none predominant. Suggest appropriate recollection if clinically indicated.   Report Status 01/29/2018 FINAL  Final  MRSA PCR Screening     Status: None   Collection Time: 01/29/18  2:13 AM  Result Value Ref Range Status   MRSA by PCR NEGATIVE NEGATIVE Final    Comment:        The GeneXpert MRSA Assay (FDA approved for NASAL specimens only), is one component of a comprehensive MRSA colonization surveillance program. It is not intended to diagnose MRSA infection nor to guide or monitor treatment for MRSA infections. Performed at [facility], [address] [place], [place] [address]          Radiology Studies: Dg Abd 1 View  Result Date: 01/29/2018 CLINICAL DATA:  Follow-up fecal impaction. EXAM: ABDOMEN - 1 VIEW COMPARISON:  Abdominal radiograph performed 01/28/2018 FINDINGS: The sigmoid colon and rectum remain diffusely distended with stool, compatible with fecal impaction, though this is difficult to fully characterize on radiograph. There is persistent diffuse distention of small and large bowel loops, reflecting associated ileus. Mild degenerative change is noted along the lumbar spine. Right basilar airspace opacity raises concern for pneumonia. IMPRESSION: 1. The sigmoid colon and rectum remain diffusely distended with stool, compatible with fecal impaction. Underlying diffuse distention of small and large bowel loops. 2. Right basilar airspace opacity raises concern for pneumonia. Electronically Signed   By: Roanna RaiderJeffery  Chang M.D.   On: 01/29/2018 06:22   Dg Abd 1 View  Result Date: 01/28/2018 CLINICAL DATA:  Abdominal distension EXAM: ABDOMEN - 1 VIEW COMPARISON:  01/27/2018 CT FINDINGS:  Two supine views. Compared to yesterday's scout view, similar diffuse colonic distension. Stool ball within the rectum, again consistent with fecal impaction. No pneumatosis or evidence of free intraperitoneal air. Calcified splenic mass as detailed on CT, 5.3 cm. IMPRESSION: Similar diffuse colonic distension, likely related to fecal impaction. Electronically Signed   By: Jeronimo GreavesKyle  Talbot M.D.   On: 01/28/2018 07:17   [place] Renal  Result Date: 01/27/2018 CLINICAL DATA:  Acute kidney injury EXAM: RENAL / URINARY TRACT ULTRASOUND COMPLETE COMPARISON:  CT abdomen 01/27/2018 FINDINGS: Right Kidney: Renal measurements: 10.0 by 4.0 by 3.8 cm = volume: 79 mL. A 2.2 by 2.3 by 1.7 cm mildly complex right kidney lower pole lesion likely reflects the parapelvic cystic lesion shown on recent CT. The smaller exophytic 1.0 cm complex lesions shown on recent CT is not well seen sonographically. Accentuated renal echogenicity. Left Kidney: Renal measurements: 7.0 by 3.8 by 3.7 cm = volume: 51.5 mL. Accentuated renal echogenicity. Bladder: Nondistended urinary bladder, 8.1 by 2.1 by 10.1 cm (volume = 90 cm^3). IMPRESSION: 1. Echogenic kidneys potentially from chronic medical renal disease. Kidneys are small and atrophic. 2. Mildly complex suspected parapelvic cyst in the right kidney lower pole. A smaller 1.0 cm exophytic lesion from the right kidney lower pole shown on recent CT is not well seen by ultrasound. Electronically Signed   By: Gaylyn RongWalter  Liebkemann M.D.   On: 01/27/2018 20:26   Dg Chest Port 1 View  Result Date: 01/27/2018 CLINICAL DATA:  Hypoxia EXAM: PORTABLE CHEST 1 VIEW COMPARISON:  09/03/2013 FINDINGS: Extremely low lung volumes. Lung volumes are decreased from comparison exam. Bibasilar atelectasis. Cardiac silhouette is difficult to evaluate. Patient rotated additionally. IMPRESSION: Extremely low lung volumes. Bibasilar atelectasis. No clear acute findings. Electronically Signed   By: Genevive BiStewart  Edmunds M.D.   On:  01/27/2018 19:49        Scheduled Meds: .  aspirin EC  81 mg Oral Daily  . heparin  5,000 Units Subcutaneous Q8H  . ipratropium  0.5 mg Nebulization TID  . levalbuterol  1.25 mg Nebulization TID  . metoprolol tartrate  50 mg Oral BID  . polyethylene glycol  17 g Oral BID  . senna-docusate  1 tablet Oral BID  . [START ON 02/18/2018] Vitamin D (Ergocalciferol)  50,000 Units Oral Q30 days   Continuous Infusions: . 0.9 % NaCl with KCl 40 mEq / L 75 mL/hr (01/29/18 1400)  . piperacillin-tazobactam (ZOSYN)  IV 3.375 g (01/29/18 1434)     LOS: 0 days    Time spent: 25 Minutes    Berton Mount, MD  [facility] Pager #: [phone] 7PM-7AM contact night coverage as above

## 2018-01-30 ENCOUNTER — Observation Stay (HOSPITAL_COMMUNITY): Payer: Medicare Other

## 2018-01-30 DIAGNOSIS — N179 Acute kidney failure, unspecified: Secondary | ICD-10-CM | POA: Diagnosis not present

## 2018-01-30 DIAGNOSIS — K5641 Fecal impaction: Secondary | ICD-10-CM | POA: Diagnosis not present

## 2018-01-30 DIAGNOSIS — K6389 Other specified diseases of intestine: Secondary | ICD-10-CM | POA: Diagnosis not present

## 2018-01-30 LAB — CBC WITH DIFFERENTIAL/PLATELET
Abs Immature Granulocytes: 0.08 10*3/uL — ABNORMAL HIGH (ref 0.00–0.07)
Basophils Absolute: 0 10*3/uL (ref 0.0–0.1)
Basophils Relative: 0 %
Eosinophils Absolute: 0.1 10*3/uL (ref 0.0–0.5)
Eosinophils Relative: 1 %
HCT: 44.2 % (ref 36.0–46.0)
Hemoglobin: 13.7 g/dL (ref 12.0–15.0)
Immature Granulocytes: 1 %
Lymphocytes Relative: 7 %
Lymphs Abs: 1 10*3/uL (ref 0.7–4.0)
MCH: 25.5 pg — ABNORMAL LOW (ref 26.0–34.0)
MCHC: 31 g/dL (ref 30.0–36.0)
MCV: 82.2 fL (ref 80.0–100.0)
Monocytes Absolute: 1 10*3/uL (ref 0.1–1.0)
Monocytes Relative: 8 %
Neutro Abs: 11.1 10*3/uL — ABNORMAL HIGH (ref 1.7–7.7)
Neutrophils Relative %: 83 %
Platelets: 201 10*3/uL (ref 150–400)
RBC: 5.38 MIL/uL — ABNORMAL HIGH (ref 3.87–5.11)
RDW: 15.4 % (ref 11.5–15.5)
WBC: 13.3 10*3/uL — ABNORMAL HIGH (ref 4.0–10.5)
nRBC: 0 % (ref 0.0–0.2)

## 2018-01-30 LAB — RENAL FUNCTION PANEL
Albumin: 3.2 g/dL — ABNORMAL LOW (ref 3.5–5.0)
Anion gap: 9 (ref 5–15)
BUN: 26 mg/dL — ABNORMAL HIGH (ref 8–23)
CO2: 22 mmol/L (ref 22–32)
Calcium: 8.7 mg/dL — ABNORMAL LOW (ref 8.9–10.3)
Chloride: 125 mmol/L — ABNORMAL HIGH (ref 98–111)
Creatinine, Ser: 1.15 mg/dL — ABNORMAL HIGH (ref 0.44–1.00)
GFR calc Af Amer: 52 mL/min — ABNORMAL LOW (ref >60)
GFR calc non Af Amer: 45 mL/min — ABNORMAL LOW (ref >60)
Glucose, Bld: 133 mg/dL — ABNORMAL HIGH (ref 70–99)
Phosphorus: <1 mg/dL — CL (ref 2.5–4.6)
Potassium: 2.9 mmol/L — ABNORMAL LOW (ref 3.5–5.1)
Sodium: 156 mmol/L — ABNORMAL HIGH (ref 135–145)

## 2018-01-30 LAB — GLUCOSE, CAPILLARY: Glucose-Capillary: 108 mg/dL — ABNORMAL HIGH (ref 70–99)

## 2018-01-30 MED ORDER — POTASSIUM CL IN DEXTROSE 5% 20 MEQ/L IV SOLN
20.0000 meq | INTRAVENOUS | Status: DC
  Administered 2018-01-30 – 2018-02-01 (×3): 20 meq via INTRAVENOUS
  Filled 2018-01-30 (×5): qty 1000

## 2018-01-30 MED ORDER — POTASSIUM CHLORIDE CRYS ER 20 MEQ PO TBCR
40.0000 meq | EXTENDED_RELEASE_TABLET | Freq: Once | ORAL | Status: AC
  Administered 2018-01-30: 40 meq via ORAL
  Filled 2018-01-30: qty 2

## 2018-01-30 MED ORDER — SODIUM CHLORIDE 0.45 % IV SOLN
INTRAVENOUS | Status: DC
  Administered 2018-01-30: 03:00:00 via INTRAVENOUS

## 2018-01-30 MED ORDER — POTASSIUM CHLORIDE CRYS ER 20 MEQ PO TBCR
40.0000 meq | EXTENDED_RELEASE_TABLET | ORAL | Status: AC
  Administered 2018-01-30 (×2): 40 meq via ORAL
  Filled 2018-01-30 (×2): qty 2

## 2018-01-30 NOTE — Progress Notes (Signed)
PROGRESS NOTE    [name] KinsBarbara J [name]  WUJ:[id number] DOB: 06/05/1940 DOA: 01/27/2018 PCP: Nadara EatonPiazza, Michael J, MD  Outpatient Specialists:   Brief Narrative:  As per H and P "[name] KinsBarbara J [name] is a 77 y.o. female with medical history significant of atrial fibrillation, chronic diastolic CHF, CAD, history of hysterectomy, hyperlipidemia, prediabetes, Parkinson's disease and dementia, history of CVA with right-sided weakness, history of heart murmur and other comorbidities who presents with abdominal distention, poor p.o. intake, as well as dehydration and inability to defecate.  Patient is unable to provide respiratory history due to her advanced dementia and Parkinson's disease and does answer simple questions yes and no but is unable to fully explain.  Currently stays at a skilled nursing facility and reportedly the staff there states that she has had poor p.o. intake and has been eating since Saturday and has not been drinking at all.  Patient's daughter is at bedside and provides most of the history and she states the patient ate Friday night.  There is noted abdominal distention today but daughter states that she noticed yesterday and mentioned to the nurse.  Is unclear when she has had a bowel movement or not.  Patient is unable to tell us when she is abdominal due to her underlying dementia.  Patient's family states that she is not been complaining of anything but CNA at the facility noticed that she had started vomiting on Sunday evening.  She is found to have severe abdominal distention and a CT of the abdomen pelvis was done which showed she is gaseous distention of the colon the very large stool burden identified within the rectum which favored a rectal impaction.  [facility] was called to admit this patient for abdominal distention and suspected rectal impaction and she started on a bowel regimen and given a suppository.  Additionally patient was found to have an AKI likely due to her poor p.o. intake.  ED  Course: In the ED she had basic blood work done as well as a CT of the abdomen pelvis.  She is also given a Fleet enema without much success.  EDP also give a 500 mL bolus and placed her on normal saline at a rate of 125 mL's per hour which I decreased down to 75 mL's per hour".  01/28/2018: Abdominal x-ray done today still reveals fecal impaction with dilated colon.  Acute kidney injury is resolving.  Potassium has gone up to 3.  We will continue to replete potassium.  Will repeat KUB in the morning.  Will repeat enema.  01/29/2018:  Repeat Abd X Ray continues to repeat rectal impaction and colonic dilatation.  01/30/2018: Patient continues to improve.  Continue daily enema.  Potassium is 2.9.  Sodium is 156.  Will change IV fluids to D5 water.  Will replete potassium.  We will pursue chest x-ray and abdominal x-ray.  Continue antibiotics for now.  Continue to monitor WBC.  Continue to assess for possible associated pneumonia versus atelectasis.   Assessment & Plan:   Active Problems:   Essential hypertension   Coronary atherosclerosis   Atrial fibrillation (HCC)   Diastolic CHF (HCC)   Ambulatory dysfunction   Parkinson disease (HCC)   Constipation   Dementia due to Parkinson's disease with behavioral disturbance (HCC)   Hypokalemia   AKI (acute kidney injury) (HCC)   Leukocytosis   Acute on chronic respiratory failure with hypoxia (HCC)   Hyperbilirubinemia   Abdominal distention   Abdominal Distention in the setting of Severe  Constipation/Obstipation -Place in Obs Telemetry -Tried Fleet Enema without success -Placed on bowel regimen with MiraLAX 17 g p.o. twice daily, senna docusate 1 tab p.o. twice daily, and try smog enema -If not improving will maybe consider consultation to gastroenterology -Replete electrolytes and maintain potassium above 4.0 and magnesium above 2  01/29/2018: GI consult called. Continue enema.  Trial of manual disimpaction was unsuccessful. 01/30/2018:  Continue daily enema.  Abdominal x-ray.  Monitor and optimize electrolytes.  Hypokalemia -Patient's K+ on admission was severely low at 2.2 -Pleat with IV potassium 20 Norco once in the ED and I have given additional 60.  I have also started the patient on normal saline +40 mEq of KCl at a rate of 75 mils per hour -Continue monitor replete as necessary -Check magnesium level -Repeat CMP in a.m. 01/27/2018: Potassium is 3.4 today. 01/30/2018: Potassium is 2.9 today.  Will replete.  AKI -Check U/A and urinalysis -Obtain renal ultrasound -Bladder scan did not show very much however may be inaccurate so we will check renal ultrasound to see if the patient has any hydronephrosis -Patient is doing well except creatinine on admission was 76/3.13 -Avoid nephrotoxic medications if possible and avoid hypotension -Continue monitor and trend renal function repeat CMP in a.m. 01/30/2018: AKI is resolving  Leukocytosis -? Reactive in the setting of Nausea,Vomiting and Dehydration and possible Aspiration -WBC was 18.1 and Neutrophil Count was 15.4 -Afebrile so do Not feel as if it is infectious mediated as she has no abdominal pain but did sound a little diminished  -Given IV Abx in the ED with IV Zosyn and Metronidazole; Will continue IV Zosyn -Continue to Monitor -Check Blood Cx x2 -Repeat CBC in AM  01/28/2018: Repeat CBC in the morning.  No CBC visualized today.  Nasal swab for MRSA. 01/30/18: Resolving.   Acute Respiratory Failure with Hypoxia; ? Aspiration -O2 Saturations dropped to 87% on Room Air -Placed on 2 Liters and O2 Sat's improved to 93% -CT Scan showed Atelectasis with subplerual consolidation noted within the RLL -Check SLP -Check CXR -C/w NPO and Aspiration Precautions -WBC was 18.1 and repeat in AM -Start Xopenex/Atrovent Scheduled  01/28/2018: Continue to assess.  Repeat chest x-ray after patient is fully hydrated. 01/30/18: Chest x-ray  today.  Hyperbilirubinemia -Mild at 1.4  Dementia and Parkinsons -SLP as above -C/w NPO for now -Aspiration Precautions -Delirium Precautions  -C/w Quetiapine 1-2 tabs po qHS  Chronic Diastolic CHF -Continue to Monitor due to Volume Resuscitation -Strict I's/O's; Daily Weights -C/w Metoprolol 50 mg po BID   HTN -Hold Amlodipine-Benezapril 5-20 mg po Daily currently   Hx of A Fib/SVT -C/w Metoprolol 50 mg po BID -C/w Telemetry   CAD -C/w ASA and Metoprolol   DVT prophylaxis: Heparin 5,000 units sq q8h Code Status: DO NOT RESUSCITATE  Family Communication: Discussed with Daughter Disposition Plan: Anticipate D/C back to SNF when Abdominal Distention is resolved and WBC trends down Consults called: None Admission status: Obs Telemetry  Procedures:   None  Antimicrobials:   IV Zosyn  Subjective: Patient is unable to provide any history. Patient is more interactive, and improving.    Objective: Vitals:   01/29/18 2102 01/29/18 2108 01/30/18 0531 01/30/18 0830  BP: 134/69  124/69 133/70  Pulse: 66  64 62  Resp: 16  16   Temp: 98.5 F (36.9 C)  98.1 F (36.7 C)   TempSrc: Oral  Oral   SpO2: 99% 96% 94%   Weight:      Height:  Intake/Output Summary (Last 24 hours) at 01/30/2018 1119 Last data filed at 01/30/2018 0200 Gross per 24 hour  Intake 2021.25 ml  Output -  Net 2021.25 ml   Filed Weights   01/27/18 2158 01/28/18 0707 01/29/18 0500  Weight: 77 kg 77 kg 75 kg    Examination:  General exam: Appears calm and comfortable. Ill looking. Respiratory system: Clear to auscultation.  Cardiovascular system: S1 & S2  Gastrointestinal system: Abdomen is distended and nontender.  Organs are difficult to assess.   Central nervous system: Awake, more interactive.  Data Reviewed: I have personally reviewed following labs and imaging studies  CBC: Recent Labs  Lab 01/27/18 1110 01/29/18 0408 01/30/18 0431  WBC 18.1* 13.4* 13.3*   NEUTROABS 15.4* 11.3* 11.1*  HGB 14.9 14.8 13.7  HCT 47.9* 47.8* 44.2  MCV 82.7 82.0 82.2  PLT 250 194 201   Basic Metabolic Panel: Recent Labs  Lab 01/27/18 1110 01/27/18 1925 01/28/18 1423 01/29/18 0408 01/30/18 0431  NA 141  --  148* 150*  149* 156*  K 2.2*  --  3.0* 3.4*  3.4* 2.9*  CL 106  --  116* 121*  121* 125*  CO2 19*  --  17* 18*  18* 22  GLUCOSE 155*  --  114* 111*  109* 133*  BUN 76*  --  62* 43*  43* 26*  CREATININE 3.13*  --  1.95* 1.42*  1.39* 1.15*  CALCIUM 9.5  --  8.9 8.7*  8.7* 8.7*  MG 2.8*  --  2.7* 2.7*  --   PHOS  --  5.1* 3.5 1.7*  1.6* <1.0*   GFR: Estimated Creatinine Clearance: 37.6 mL/min (A) (by C-G formula based on SCr of 1.15 mg/dL (H)). Liver Function Tests: Recent Labs  Lab 01/27/18 1110 01/28/18 1423 01/29/18 0408 01/30/18 0431  AST 28  --   --   --   ALT 23  --   --   --   ALKPHOS 87  --   --   --   BILITOT 1.4*  --   --   --   PROT 7.7  --   --   --   ALBUMIN 4.4 3.6 3.4*  3.4* 3.2*   Recent Labs  Lab 01/27/18 1110  LIPASE 63*   No results for input(s): AMMONIA in the last 168 hours. Coagulation Profile: No results for input(s): INR, PROTIME in the last 168 hours. Cardiac Enzymes: No results for input(s): CKTOTAL, CKMB, CKMBINDEX, TROPONINI in the last 168 hours. BNP (last 3 results) No results for input(s): PROBNP in the last 8760 hours. HbA1C: No results for input(s): HGBA1C in the last 72 hours. CBG: Recent Labs  Lab 01/28/18 0757 01/29/18 0756 01/30/18 0736  GLUCAP 110* 103* 108*   Lipid Profile: No results for input(s): CHOL, HDL, LDLCALC, TRIG, CHOLHDL, LDLDIRECT in the last 72 hours. Thyroid Function Tests: Recent Labs    01/27/18 1925  TSH 3.660   Anemia Panel: No results for input(s): VITAMINB12, FOLATE, FERRITIN, TIBC, IRON, RETICCTPCT in the last 72 hours. Urine analysis:    Component Value Date/Time   COLORURINE AMBER (A) 01/27/2018 1723   APPEARANCEUR CLOUDY (A) 01/27/2018 1723    LABSPEC 1.027 01/27/2018 1723   PHURINE 5.0 01/27/2018 1723   GLUCOSEU NEGATIVE 01/27/2018 1723   HGBUR NEGATIVE 01/27/2018 1723   BILIRUBINUR NEGATIVE 01/27/2018 1723   KETONESUR NEGATIVE 01/27/2018 1723   PROTEINUR 100 (A) 01/27/2018 1723   UROBILINOGEN 1.0 09/02/2013 1132   NITRITE NEGATIVE  01/27/2018 1723   LEUKOCYTESUR NEGATIVE 01/27/2018 1723   Sepsis Labs: @LABRCNTIP (procalcitonin:4,lacticidven:4)  ) Recent Results (from the past 240 hour(s))  Culture, Urine     Status: None   Collection Time: 01/27/18  5:23 PM  Result Value Ref Range Status   Specimen Description   Final    URINE, RANDOM Performed at [facility], [address] [place], [place] [address]    Special Requests   Final    NONE Performed at [facility], [address] [place], [place] [address]    Culture   Final    Multiple bacterial morphotypes present, none predominant. Suggest appropriate recollection if clinically indicated.   Report Status 01/29/2018 FINAL  Final  MRSA PCR Screening     Status: None   Collection Time: 01/29/18  2:13 AM  Result Value Ref Range Status   MRSA by PCR NEGATIVE NEGATIVE Final    Comment:        The GeneXpert MRSA Assay (FDA approved for NASAL specimens only), is one component of a comprehensive MRSA colonization surveillance program. It is not intended to diagnose MRSA infection nor to guide or monitor treatment for MRSA infections. Performed at [facility], [address] [place], [place] [address]          Radiology Studies: Dg Abd 1 View  Result Date: 01/29/2018 CLINICAL DATA:  Follow-up fecal impaction. EXAM: ABDOMEN - 1 VIEW COMPARISON:  Abdominal radiograph performed 01/28/2018 FINDINGS: The sigmoid colon and rectum remain diffusely distended with stool, compatible with fecal impaction, though this is difficult to fully characterize on radiograph. There is persistent diffuse distention  of small and large bowel loops, reflecting associated ileus. Mild degenerative change is noted along the lumbar spine. Right basilar airspace opacity raises concern for pneumonia. IMPRESSION: 1. The sigmoid colon and rectum remain diffusely distended with stool, compatible with fecal impaction. Underlying diffuse distention of small and large bowel loops. 2. Right basilar airspace opacity raises concern for pneumonia. Electronically Signed   By: Roanna Raider M.D.   On: 01/29/2018 06:22        Scheduled Meds: . aspirin EC  81 mg Oral Daily  . heparin  5,000 Units Subcutaneous Q8H  . metoprolol tartrate  50 mg Oral BID  . polyethylene glycol  17 g Oral BID  . potassium chloride  40 mEq Oral Q4H  . senna-docusate  1 tablet Oral BID  . sorbitol, milk of mag, mineral oil, glycerin (SMOG) enema  960 mL Rectal Daily  . [START ON 02/18/2018] Vitamin D (Ergocalciferol)  50,000 Units Oral Q30 days   Continuous Infusions: . dextrose 5 % with KCl 20 mEq / L    . piperacillin-tazobactam (ZOSYN)  IV 3.375 g (01/30/18 0551)     LOS: 0 days    Time spent: 25 Minutes    Berton Mount, MD  [facility] Pager #: [phone] 7PM-7AM contact night coverage as above

## 2018-01-30 NOTE — Progress Notes (Signed)
Subjective: Several muddy stools. Patient unable to voice complaints.  Objective: Vital signs in last 24 hours: Temp:  [97.7 F (36.5 C)-98.5 F (36.9 C)] 98.1 F (36.7 C) (12/27 0531) Pulse Rate:  [56-66] 62 (12/27 0830) Resp:  [16] 16 (12/27 0531) BP: (124-137)/(69-77) 133/70 (12/27 0830) SpO2:  [94 %-99 %] 94 % (12/27 0531) Weight change:  Last BM Date: 01/30/18  PE: GEN:  NAD ABD:  Soft, mild distended and tympanic, no peritonitis  Lab Results: CBC    Component Value Date/Time   WBC 13.3 (H) 01/30/2018 0431   RBC 5.38 (H) 01/30/2018 0431   HGB 13.7 01/30/2018 0431   HCT 44.2 01/30/2018 0431   PLT 201 01/30/2018 0431   MCV 82.2 01/30/2018 0431   MCH 25.5 (L) 01/30/2018 0431   MCHC 31.0 01/30/2018 0431   RDW 15.4 01/30/2018 0431   LYMPHSABS 1.0 01/30/2018 0431   MONOABS 1.0 01/30/2018 0431   EOSABS 0.1 01/30/2018 0431   BASOSABS 0.0 01/30/2018 0431   Assessment:  1.  Constipation, obstipation with fecal impaction in rectosigmoid colon with upstream colonic distention.  Likely multifactorial (Parkinson's, poor mobility).   2.  Anal stenosis.  This might be contributing to her difficulty in fecal output, as there was voluminous pudding-consistency stool output during rectal exam (which essentially transiently relieved her degree of anal stenosis). 3.  Multiple medical problems.  Plan:  1.  Continue enemas and miralax. 2.  Sips clear liquids. 3.  There is improvement; would continue current therapy. 4.  [facility] will follow.   Freddy JakschOUTLAW,[place] M 01/30/2018, 10:15 AM   Cell [phone] If no answer or after 5 PM call [phone]

## 2018-01-31 ENCOUNTER — Encounter (HOSPITAL_COMMUNITY): Payer: Self-pay

## 2018-01-31 DIAGNOSIS — K6389 Other specified diseases of intestine: Secondary | ICD-10-CM | POA: Diagnosis not present

## 2018-01-31 DIAGNOSIS — N179 Acute kidney failure, unspecified: Secondary | ICD-10-CM | POA: Diagnosis not present

## 2018-01-31 DIAGNOSIS — D72829 Elevated white blood cell count, unspecified: Secondary | ICD-10-CM | POA: Diagnosis not present

## 2018-01-31 DIAGNOSIS — K5641 Fecal impaction: Secondary | ICD-10-CM | POA: Diagnosis not present

## 2018-01-31 LAB — CBC WITH DIFFERENTIAL/PLATELET
Abs Immature Granulocytes: 0.09 10*3/uL — ABNORMAL HIGH (ref 0.00–0.07)
Basophils Absolute: 0 10*3/uL (ref 0.0–0.1)
Basophils Relative: 0 %
Eosinophils Absolute: 0.6 10*3/uL — ABNORMAL HIGH (ref 0.0–0.5)
Eosinophils Relative: 5 %
HCT: 38.1 % (ref 36.0–46.0)
Hemoglobin: 12.2 g/dL (ref 12.0–15.0)
Immature Granulocytes: 1 %
Lymphocytes Relative: 17 %
Lymphs Abs: 2 10*3/uL (ref 0.7–4.0)
MCH: 25.5 pg — ABNORMAL LOW (ref 26.0–34.0)
MCHC: 32 g/dL (ref 30.0–36.0)
MCV: 79.7 fL — ABNORMAL LOW (ref 80.0–100.0)
Monocytes Absolute: 1.2 10*3/uL — ABNORMAL HIGH (ref 0.1–1.0)
Monocytes Relative: 10 %
Neutro Abs: 8 10*3/uL — ABNORMAL HIGH (ref 1.7–7.7)
Neutrophils Relative %: 67 %
Platelets: 174 10*3/uL (ref 150–400)
RBC: 4.78 MIL/uL (ref 3.87–5.11)
RDW: 15.7 % — ABNORMAL HIGH (ref 11.5–15.5)
WBC: 11.8 10*3/uL — ABNORMAL HIGH (ref 4.0–10.5)
nRBC: 0 % (ref 0.0–0.2)

## 2018-01-31 LAB — BASIC METABOLIC PANEL
Anion gap: 6 (ref 5–15)
BUN: 9 mg/dL (ref 8–23)
CO2: 20 mmol/L — ABNORMAL LOW (ref 22–32)
Calcium: 8.1 mg/dL — ABNORMAL LOW (ref 8.9–10.3)
Chloride: 118 mmol/L — ABNORMAL HIGH (ref 98–111)
Creatinine, Ser: 0.86 mg/dL (ref 0.44–1.00)
GFR calc Af Amer: >60 mL/min (ref >60)
GFR calc non Af Amer: >60 mL/min (ref >60)
Glucose, Bld: 100 mg/dL — ABNORMAL HIGH (ref 70–99)
Potassium: 3.4 mmol/L — ABNORMAL LOW (ref 3.5–5.1)
Sodium: 144 mmol/L (ref 135–145)

## 2018-01-31 LAB — MAGNESIUM: Magnesium: 2.1 mg/dL (ref 1.7–2.4)

## 2018-01-31 MED ORDER — POTASSIUM CHLORIDE 10 MEQ/100ML IV SOLN
10.0000 meq | INTRAVENOUS | Status: AC
  Administered 2018-01-31 (×6): 10 meq via INTRAVENOUS
  Filled 2018-01-31 (×6): qty 100

## 2018-01-31 MED ORDER — POTASSIUM CHLORIDE 20 MEQ PO PACK
40.0000 meq | PACK | Freq: Once | ORAL | Status: AC
  Administered 2018-01-31: 40 meq via ORAL
  Filled 2018-01-31: qty 2

## 2018-01-31 NOTE — Progress Notes (Addendum)
Pharmacy Antibiotic Note  [name] is a 77 y.o. female admitted on 01/27/2018 with possible aspiration  pneumonia.  Pharmacy has been consulted for Zosyn dosing.  01/31/2018: D#4 antibiotics Zosyn 3.375 grams IV q8h (4-hour infusion) Afebrile Leukocytosis improving Azotemia resolved CXR 12/27 improved aeration;persistent basilar opacities  Plan: No change to current Zosyn dosage this AM Consider de-escalation/discontinuation of antibiotics when appropriate . Height: 5\' 2"  (157.5 cm) Weight: 166 lb (75.3 kg) IBW/kg (Calculated) : 50.1  Temp (24hrs), Avg:98.7 F (37.1 C), Min:98.4 F (36.9 C), Max:98.9 F (37.2 C)  Recent Labs  Lab 01/27/18 1110 01/28/18 1423 01/29/18 0408 01/30/18 0431 01/31/18 0548  WBC 18.1*  --  13.4* 13.3* 11.8*  CREATININE 3.13* 1.95* 1.42*  1.39* 1.15* 1.02*    Estimated Creatinine Clearance: 42.5 mL/min (A) (by C-G formula based on SCr of 1.02 mg/dL (H)).    Allergies  Allergen Reactions  . Sulfonamide Derivatives Other (See Comments)    Unknown. Pt doesn't remember.    Antimicrobials this admission:  12/24 Flagyl x 1 12/24 Zosyn >>  Dose adjustments this admission:  12/25 Zosyn increased to 3.375 grams IV q8h (4-hour infusion) given improving renal function   Microbiology results:  12/24 UCx: NGF  Thank you for allowing pharmacy to be a part of this patient's care.  Elie Goodyandy Rohan Juenger, PharmD, BCPS [phone] 01/31/2018  8:26 AM

## 2018-01-31 NOTE — Progress Notes (Signed)
CRITICAL VALUE ALERT  Critical Value:  Potassium 2.7  Date & Time Notied:  01/31/18 @ 0719  Provider Notified: Yes  Orders Received/Actions taken: Awaiting new orders

## 2018-01-31 NOTE — Progress Notes (Signed)
PROGRESS NOTE    [name] KinsBarbara J Koehl  UEA:[id number] DOB: 05/08/1940 DOA: 01/27/2018 PCP: Nadara EatonPiazza, Michael J, MD  Outpatient Specialists:   Brief Narrative:  As per H and P "[name] KinsBarbara J Sison is a 77 y.o. female with medical history significant of atrial fibrillation, chronic diastolic CHF, CAD, history of hysterectomy, hyperlipidemia, prediabetes, Parkinson's disease and dementia, history of CVA with right-sided weakness, history of heart murmur and other comorbidities who presents with abdominal distention, poor p.o. intake, as well as dehydration and inability to defecate.  Patient is unable to provide respiratory history due to her advanced dementia and Parkinson's disease and does answer simple questions yes and no but is unable to fully explain.  Currently stays at a skilled nursing facility and reportedly the staff there states that she has had poor p.o. intake and has been eating since Saturday and has not been drinking at all.  Patient's daughter is at bedside and provides most of the history and she states the patient ate Friday night.  There is noted abdominal distention today but daughter states that she noticed yesterday and mentioned to the nurse.  Is unclear when she has had a bowel movement or not.  Patient is unable to tell us when she is abdominal due to her underlying dementia.  Patient's family states that she is not been complaining of anything but CNA at the facility noticed that she had started vomiting on Sunday evening.  She is found to have severe abdominal distention and a CT of the abdomen pelvis was done which showed she is gaseous distention of the colon the very large stool burden identified within the rectum which favored a rectal impaction.  [facility] was called to admit this patient for abdominal distention and suspected rectal impaction and she started on a bowel regimen and given a suppository.  Additionally patient was found to have an AKI likely due to her poor p.o. intake.  ED  Course: In the ED she had basic blood work done as well as a CT of the abdomen pelvis.  She is also given a Fleet enema without much success.  EDP also give a 500 mL bolus and placed her on normal saline at a rate of 125 mL's per hour which I decreased down to 75 mL's per hour".  01/28/2018: Abdominal x-ray done today still reveals fecal impaction with dilated colon.  Acute kidney injury is resolving.  Potassium has gone up to 3.  We will continue to replete potassium.  Will repeat KUB in the morning.  Will repeat enema.  01/29/2018:  Repeat Abd X Ray continues to repeat rectal impaction and colonic dilatation.  01/30/2018: Patient continues to improve.  Continue daily enema.  Potassium is 2.9.  Sodium is 156.  Will change IV fluids to D5 water.  Will replete potassium.  We will pursue chest x-ray and abdominal x-ray.  Continue antibiotics for now.  Continue to monitor WBC.  Continue to assess for possible associated pneumonia versus atelectasis.  01/31/2018: Patient continues to improve.  Sodium is down to 142, potassium is 2.7.  Serum creatinine is 1.02.  Will replete potassium.  Continue to monitor electrolytes.  Continue IV fluids and SMOG for now.  Assessment & Plan:   Active Problems:   Essential hypertension   Coronary atherosclerosis   Atrial fibrillation (HCC)   Diastolic CHF (HCC)   Ambulatory dysfunction   Parkinson disease (HCC)   Constipation   Dementia due to Parkinson's disease with behavioral disturbance (HCC)   Hypokalemia  AKI (acute kidney injury) (HCC)   Leukocytosis   Acute on chronic respiratory failure with hypoxia (HCC)   Hyperbilirubinemia   Abdominal distention   Abdominal Distention in the setting of Severe Constipation/Obstipation -Place in Obs Telemetry -Tried Fleet Enema without success -Placed on bowel regimen with MiraLAX 17 g p.o. twice daily, senna docusate 1 tab p.o. twice daily, and try smog enema -If not improving will maybe consider consultation  to gastroenterology -Replete electrolytes and maintain potassium above 4.0 and magnesium above 2  01/29/2018: GI consult called. Continue enema.  Trial of manual disimpaction was unsuccessful. 01/30/2018: Continue daily enema.  Abdominal x-ray.  Monitor and optimize electrolytes. 01/31/2018: Continue SMOG.  Hypokalemia -Patient's K+ on admission was severely low at 2.2 -Pleat with IV potassium 20 Norco once in the ED and I have given additional 60.  I have also started the patient on normal saline +40 mEq of KCl at a rate of 75 mils per hour -Continue monitor replete as necessary -Check magnesium level -Repeat CMP in a.m. 01/27/2018: Potassium is 3.4 today. 01/31/2018: Potassium is 2.7 today.  Will replete.  AKI -Check U/A and urinalysis -Obtain renal ultrasound -Bladder scan did not show very much however may be inaccurate so we will check renal ultrasound to see if the patient has any hydronephrosis -Patient is doing well except creatinine on admission was 76/3.13 -Avoid nephrotoxic medications if possible and avoid hypotension -Continue monitor and trend renal function repeat CMP in a.m. 01/31/2018: AKI is resolving.  Serum creatinine is 1.02 today.  Leukocytosis -? Reactive in the setting of Nausea,Vomiting and Dehydration and possible Aspiration -WBC was 18.1 and Neutrophil Count was 15.4 -Afebrile so do Not feel as if it is infectious mediated as she has no abdominal pain but did sound a little diminished  -Given IV Abx in the ED with IV Zosyn and Metronidazole; Will continue IV Zosyn -Continue to Monitor -Check Blood Cx x2 -Repeat CBC in AM  01/28/2018: Repeat CBC in the morning.  No CBC visualized today.  Nasal swab for MRSA. 01/31/18: Resolving.  WBC is 11.8 today.  Acute Respiratory Failure with Hypoxia; ? Aspiration -O2 Saturations dropped to 87% on Room Air -Placed on 2 Liters and O2 Sat's improved to 93% -CT Scan showed Atelectasis with subplerual consolidation  noted within the RLL -Check SLP -Check CXR -C/w NPO and Aspiration Precautions -WBC was 18.1 and repeat in AM -Start Xopenex/Atrovent Scheduled  01/28/2018: Continue to assess.  Repeat chest x-ray after patient is fully hydrated. 01/30/18: Chest x-ray today.  Hyperbilirubinemia -Mild at 1.4  Dementia and Parkinsons -SLP as above -C/w NPO for now -Aspiration Precautions -Delirium Precautions  -C/w Quetiapine 1-2 tabs po qHS  Chronic Diastolic CHF -Continue to Monitor due to Volume Resuscitation -Strict I's/O's; Daily Weights -C/w Metoprolol 50 mg po BID   HTN -Hold Amlodipine-Benezapril 5-20 mg po Daily currently   Hx of A Fib/SVT -C/w Metoprolol 50 mg po BID -C/w Telemetry   CAD -C/w ASA and Metoprolol   DVT prophylaxis: Heparin 5,000 units sq q8h Code Status: DO NOT RESUSCITATE  Family Communication: Discussed with Daughter Disposition Plan: Anticipate D/C back to SNF when Abdominal Distention is resolved and WBC trends down Consults called: None Admission status: Obs Telemetry  Procedures:   None  Antimicrobials:   IV Zosyn  Subjective: Patient is unable to provide coherent history. Patient is more interactive, and improving.    Objective: Vitals:   01/30/18 2200 01/31/18 0353 01/31/18 0357 01/31/18 0915  BP: 116/63  Marland Kitchen(!)  104/58 122/72  Pulse: 62  61 64  Resp: 18  16   Temp: 98.4 F (36.9 C)  98.7 F (37.1 C)   TempSrc: Oral  Oral   SpO2: 100%  99% 100%  Weight:  75.3 kg    Height:        Intake/Output Summary (Last 24 hours) at 01/31/2018 1315 Last data filed at 01/31/2018 0300 Gross per 24 hour  Intake 1780.78 ml  Output -  Net 1780.78 ml   Filed Weights   01/28/18 0707 01/29/18 0500 01/31/18 0353  Weight: 77 kg 75 kg 75.3 kg    Examination:  General exam: Appears calm and comfortable.   Respiratory system: Clear to auscultation.  Cardiovascular system: S1 & S2  Gastrointestinal system: Abdomen is distended and nontender.   Organs are difficult to assess.   Central nervous system: Awake, more interactive.  Rigid  Data Reviewed: I have personally reviewed following labs and imaging studies  CBC: Recent Labs  Lab 01/27/18 1110 01/29/18 0408 01/30/18 0431 01/31/18 0548  WBC 18.1* 13.4* 13.3* 11.8*  NEUTROABS 15.4* 11.3* 11.1* 8.0*  HGB 14.9 14.8 13.7 12.2  HCT 47.9* 47.8* 44.2 38.1  MCV 82.7 82.0 82.2 79.7*  PLT 250 194 201 174   Basic Metabolic Panel: Recent Labs  Lab 01/27/18 1110 01/27/18 1925 01/28/18 1423 01/29/18 0408 01/30/18 0431 01/31/18 0548  NA 141  --  148* 150*  149* 156* 148*  K 2.2*  --  3.0* 3.4*  3.4* 2.9* 2.7*  CL 106  --  116* 121*  121* 125* 119*  CO2 19*  --  17* 18*  18* 22 21*  GLUCOSE 155*  --  114* 111*  109* 133* 112*  BUN 76*  --  62* 43*  43* 26* 12  CREATININE 3.13*  --  1.95* 1.42*  1.39* 1.15* 1.02*  CALCIUM 9.5  --  8.9 8.7*  8.7* 8.7* 8.2*  MG 2.8*  --  2.7* 2.7*  --  2.1  PHOS  --  5.1* 3.5 1.7*  1.6* <1.0* <0.1*   GFR: Estimated Creatinine Clearance: 42.5 mL/min (A) (by C-G formula based on SCr of 1.02 mg/dL (H)). Liver Function Tests: Recent Labs  Lab 01/27/18 1110 01/28/18 1423 01/29/18 0408 01/30/18 0431 01/31/18 0548  AST 28  --   --   --   --   ALT 23  --   --   --   --   ALKPHOS 87  --   --   --   --   BILITOT 1.4*  --   --   --   --   PROT 7.7  --   --   --   --   ALBUMIN 4.4 3.6 3.4*  3.4* 3.2* 2.8*   Recent Labs  Lab 01/27/18 1110  LIPASE 63*   No results for input(s): AMMONIA in the last 168 hours. Coagulation Profile: No results for input(s): INR, PROTIME in the last 168 hours. Cardiac Enzymes: No results for input(s): CKTOTAL, CKMB, CKMBINDEX, TROPONINI in the last 168 hours. BNP (last 3 results) No results for input(s): PROBNP in the last 8760 hours. HbA1C: No results for input(s): HGBA1C in the last 72 hours. CBG: Recent Labs  Lab 01/28/18 0757 01/29/18 0756 01/30/18 0736  GLUCAP 110* 103* 108*   Lipid  Profile: No results for input(s): CHOL, HDL, LDLCALC, TRIG, CHOLHDL, LDLDIRECT in the last 72 hours. Thyroid Function Tests: No results for input(s): TSH, T4TOTAL, FREET4, T3FREE, THYROIDAB in  the last 72 hours. Anemia Panel: No results for input(s): VITAMINB12, FOLATE, FERRITIN, TIBC, IRON, RETICCTPCT in the last 72 hours. Urine analysis:    Component Value Date/Time   COLORURINE AMBER (A) 01/27/2018 1723   APPEARANCEUR CLOUDY (A) 01/27/2018 1723   LABSPEC 1.027 01/27/2018 1723   PHURINE 5.0 01/27/2018 1723   GLUCOSEU NEGATIVE 01/27/2018 1723   HGBUR NEGATIVE 01/27/2018 1723   BILIRUBINUR NEGATIVE 01/27/2018 1723   KETONESUR NEGATIVE 01/27/2018 1723   PROTEINUR 100 (A) 01/27/2018 1723   UROBILINOGEN 1.0 09/02/2013 1132   NITRITE NEGATIVE 01/27/2018 1723   LEUKOCYTESUR NEGATIVE 01/27/2018 1723   Sepsis Labs: @LABRCNTIP (procalcitonin:4,lacticidven:4)  ) Recent Results (from the past 240 hour(s))  Culture, Urine     Status: None   Collection Time: 01/27/18  5:23 PM  Result Value Ref Range Status   Specimen Description   Final    URINE, RANDOM Performed at [facility], [address], [place], [place] [address]    Special Requests   Final    NONE Performed at [facility], [address], [place], [place] [address]    Culture   Final    Multiple bacterial morphotypes present, none predominant. Suggest appropriate recollection if clinically indicated.   Report Status 01/29/2018 FINAL  Final  MRSA PCR Screening     Status: None   Collection Time: 01/29/18  2:13 AM  Result Value Ref Range Status   MRSA by PCR NEGATIVE NEGATIVE Final    Comment:        The GeneXpert MRSA Assay (FDA approved for NASAL specimens only), is one component of a comprehensive MRSA colonization surveillance program. It is not intended to diagnose MRSA infection nor to guide or monitor treatment for MRSA infections. Performed at [facility], [address] [place], [place] [address]          Radiology Studies: Dg Abd 1 View  Result Date: 01/30/2018 CLINICAL DATA:  ABDOMINAL DISTENTION EXAM: ABDOMEN - 1 VIEW COMPARISON:  KUB OF 01/29/2018 AND 01/28/2017 FINDINGS: There is little change in the gaseous distention of both large and small bowel. As noted previously this may be related to fecal impaction of the rectum or possibly a distal colonic lesion. 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 There are degenerative changes diffusely throughout the thoracolumbar spine. IMPRESSION: Little change in gaseous distention of large and small bowel. Electronically Signed   By: Dwyane Dee M.D.   On: 01/30/2018 11:59   Dg Chest Port 1 View  Result Date: 01/30/2018 CLINICAL DATA:  History of atrial fibrillation, CHF, dementia EXAM: PORTABLE CHEST 1 VIEW COMPARISON:  Portable chest x-ray of 01/27/2018 FINDINGS: Aeration of the lungs has improved somewhat. There are still opacities at both lung bases which may represent atelectasis or pneumonia. No definite effusion is seen. The heart is mildly enlarged and stable. IMPRESSION: 1. Improved aeration. Persistent basilar opacities may reflect atelectasis or pneumonia. 2. Stable mild cardiomegaly. Electronically Signed   By: Dwyane Dee M.D.   On: 01/30/2018 12:02        Scheduled Meds: . aspirin EC  81 mg Oral Daily  . heparin  5,000 Units Subcutaneous Q8H  . metoprolol tartrate  50 mg Oral BID  . polyethylene glycol  17 g Oral BID  . senna-docusate  1 tablet Oral BID  . sorbitol, milk of mag, mineral oil, glycerin (SMOG) enema  960 mL Rectal Daily  . [START ON 02/18/2018]  Vitamin D (Ergocalciferol)  50,000 Units Oral Q30 days   Continuous Infusions: . dextrose 5 % with KCl 20 mEq / L 20 mEq (01/30/18 2217)  . piperacillin-tazobactam (ZOSYN)  IV 3.375 g (01/31/18 0516)  . potassium chloride 10 mEq (01/31/18 1135)     LOS: 0 days     Time spent: 25 Minutes    Berton Mount, MD  [facility] Pager #: [phone] 7PM-7AM contact night coverage as above

## 2018-02-01 ENCOUNTER — Observation Stay (HOSPITAL_COMMUNITY): Payer: Medicare Other

## 2018-02-01 DIAGNOSIS — I471 Supraventricular tachycardia: Secondary | ICD-10-CM | POA: Diagnosis present

## 2018-02-01 DIAGNOSIS — R0902 Hypoxemia: Secondary | ICD-10-CM | POA: Diagnosis not present

## 2018-02-01 DIAGNOSIS — L89152 Pressure ulcer of sacral region, stage 2: Secondary | ICD-10-CM | POA: Diagnosis present

## 2018-02-01 DIAGNOSIS — I69351 Hemiplegia and hemiparesis following cerebral infarction affecting right dominant side: Secondary | ICD-10-CM | POA: Diagnosis not present

## 2018-02-01 DIAGNOSIS — Z789 Other specified health status: Secondary | ICD-10-CM | POA: Diagnosis not present

## 2018-02-01 DIAGNOSIS — K5641 Fecal impaction: Secondary | ICD-10-CM | POA: Diagnosis present

## 2018-02-01 DIAGNOSIS — I130 Hypertensive heart and chronic kidney disease with heart failure and stage 1 through stage 4 chronic kidney disease, or unspecified chronic kidney disease: Secondary | ICD-10-CM | POA: Diagnosis present

## 2018-02-01 DIAGNOSIS — E860 Dehydration: Secondary | ICD-10-CM | POA: Diagnosis present

## 2018-02-01 DIAGNOSIS — J9621 Acute and chronic respiratory failure with hypoxia: Secondary | ICD-10-CM | POA: Diagnosis present

## 2018-02-01 DIAGNOSIS — I248 Other forms of acute ischemic heart disease: Secondary | ICD-10-CM | POA: Diagnosis present

## 2018-02-01 DIAGNOSIS — I2510 Atherosclerotic heart disease of native coronary artery without angina pectoris: Secondary | ICD-10-CM | POA: Diagnosis present

## 2018-02-01 DIAGNOSIS — E876 Hypokalemia: Secondary | ICD-10-CM | POA: Diagnosis present

## 2018-02-01 DIAGNOSIS — I5032 Chronic diastolic (congestive) heart failure: Secondary | ICD-10-CM | POA: Diagnosis present

## 2018-02-01 DIAGNOSIS — N182 Chronic kidney disease, stage 2 (mild): Secondary | ICD-10-CM | POA: Diagnosis present

## 2018-02-01 DIAGNOSIS — D62 Acute posthemorrhagic anemia: Secondary | ICD-10-CM | POA: Diagnosis present

## 2018-02-01 DIAGNOSIS — R7989 Other specified abnormal findings of blood chemistry: Secondary | ICD-10-CM | POA: Diagnosis not present

## 2018-02-01 DIAGNOSIS — G20 Parkinson's disease: Secondary | ICD-10-CM | POA: Diagnosis present

## 2018-02-01 DIAGNOSIS — R17 Unspecified jaundice: Secondary | ICD-10-CM | POA: Diagnosis present

## 2018-02-01 DIAGNOSIS — F0280 Dementia in other diseases classified elsewhere without behavioral disturbance: Secondary | ICD-10-CM | POA: Diagnosis present

## 2018-02-01 DIAGNOSIS — J9601 Acute respiratory failure with hypoxia: Secondary | ICD-10-CM | POA: Diagnosis not present

## 2018-02-01 DIAGNOSIS — E870 Hyperosmolality and hypernatremia: Secondary | ICD-10-CM | POA: Diagnosis present

## 2018-02-01 DIAGNOSIS — I959 Hypotension, unspecified: Secondary | ICD-10-CM | POA: Diagnosis not present

## 2018-02-01 DIAGNOSIS — Z66 Do not resuscitate: Secondary | ICD-10-CM | POA: Diagnosis present

## 2018-02-01 DIAGNOSIS — N179 Acute kidney failure, unspecified: Secondary | ICD-10-CM | POA: Diagnosis present

## 2018-02-01 DIAGNOSIS — K5909 Other constipation: Secondary | ICD-10-CM | POA: Diagnosis not present

## 2018-02-01 DIAGNOSIS — K6389 Other specified diseases of intestine: Secondary | ICD-10-CM | POA: Diagnosis not present

## 2018-02-01 DIAGNOSIS — K567 Ileus, unspecified: Secondary | ICD-10-CM | POA: Diagnosis present

## 2018-02-01 DIAGNOSIS — J690 Pneumonitis due to inhalation of food and vomit: Secondary | ICD-10-CM | POA: Diagnosis present

## 2018-02-01 DIAGNOSIS — I480 Paroxysmal atrial fibrillation: Secondary | ICD-10-CM | POA: Diagnosis present

## 2018-02-01 LAB — RENAL FUNCTION PANEL
Albumin: 2.8 g/dL — ABNORMAL LOW (ref 3.5–5.0)
Albumin: 2.6 g/dL — ABNORMAL LOW (ref 3.5–5.0)
Anion gap: 8 (ref 5–15)
Anion gap: 6 (ref 5–15)
BUN: 12 mg/dL (ref 8–23)
BUN: 11 mg/dL (ref 8–23)
CO2: 21 mmol/L — ABNORMAL LOW (ref 22–32)
CO2: 20 mmol/L — ABNORMAL LOW (ref 22–32)
Calcium: 8.2 mg/dL — ABNORMAL LOW (ref 8.9–10.3)
Calcium: 7.8 mg/dL — ABNORMAL LOW (ref 8.9–10.3)
Chloride: 119 mmol/L — ABNORMAL HIGH (ref 98–111)
Chloride: 115 mmol/L — ABNORMAL HIGH (ref 98–111)
Creatinine, Ser: 1.02 mg/dL — ABNORMAL HIGH (ref 0.44–1.00)
Creatinine, Ser: 0.87 mg/dL (ref 0.44–1.00)
GFR calc Af Amer: >60 mL/min (ref >60)
GFR calc Af Amer: >60 mL/min (ref >60)
GFR calc non Af Amer: 52 mL/min — ABNORMAL LOW (ref >60)
GFR calc non Af Amer: >60 mL/min (ref >60)
Glucose, Bld: 112 mg/dL — ABNORMAL HIGH (ref 70–99)
Glucose, Bld: 116 mg/dL — ABNORMAL HIGH (ref 70–99)
Phosphorus: <0.1 mg/dL — CL (ref 2.5–4.6)
Phosphorus: <1 mg/dL — CL (ref 2.5–4.6)
Potassium: 2.7 mmol/L — CL (ref 3.5–5.1)
Potassium: 3.7 mmol/L (ref 3.5–5.1)
Sodium: 148 mmol/L — ABNORMAL HIGH (ref 135–145)
Sodium: 141 mmol/L (ref 135–145)

## 2018-02-01 LAB — GLUCOSE, CAPILLARY: Glucose-Capillary: 111 mg/dL — ABNORMAL HIGH (ref 70–99)

## 2018-02-01 MED ORDER — POTASSIUM CHLORIDE IN NACL 20-0.45 MEQ/L-% IV SOLN
INTRAVENOUS | Status: DC
  Administered 2018-02-01 – 2018-02-05 (×7): via INTRAVENOUS
  Filled 2018-02-01 (×9): qty 1000

## 2018-02-01 MED ORDER — POTASSIUM PHOSPHATES 15 MMOLE/5ML IV SOLN
20.0000 mmol | Freq: Once | INTRAVENOUS | Status: AC
  Administered 2018-02-01: 20 mmol via INTRAVENOUS
  Filled 2018-02-01: qty 6.67

## 2018-02-01 MED ORDER — POTASSIUM & SODIUM PHOSPHATES 280-160-250 MG PO PACK
1.0000 | PACK | Freq: Three times a day (TID) | ORAL | Status: AC
  Administered 2018-02-01 – 2018-02-02 (×5): 1 via ORAL
  Filled 2018-02-01 (×7): qty 1

## 2018-02-01 NOTE — Progress Notes (Signed)
CRITICAL VALUE STICKER  CRITICAL VALUE: Phosphorus < 1.0  RECEIVER (on-site recipient of call): Baird LyonsCasey, RN  DATE & TIME NOTIFIED: 02/01/2018 [id number]  MESSENGER (representative from lab):  MD NOTIFIED: NP Bodenheimer   TIME OF NOTIFICATION: 0626  RESPONSE: Awaiting new orders

## 2018-02-01 NOTE — Progress Notes (Signed)
PROGRESS NOTE    [name]  ZOX:[id number] DOB: Nov 17, 1940 DOA: 01/27/2018 PCP: Nadara Eaton, MD  Outpatient Specialists:   Brief Narrative:  As per H and P "[name] is a 77 y.o. female with medical history significant of atrial fibrillation, chronic diastolic CHF, CAD, history of hysterectomy, hyperlipidemia, prediabetes, Parkinson's disease and dementia, history of CVA with right-sided weakness, history of heart murmur and other comorbidities who presents with abdominal distention, poor p.o. intake, as well as dehydration and inability to defecate.  Patient is unable to provide respiratory history due to her advanced dementia and Parkinson's disease and does answer simple questions yes and no but is unable to fully explain.  Currently stays at a skilled nursing facility and reportedly the staff there states that she has had poor p.o. intake and has been eating since Saturday and has not been drinking at all.  Patient's daughter is at bedside and provides most of the history and she states the patient ate Friday night.  There is noted abdominal distention today but daughter states that she noticed yesterday and mentioned to the nurse.  Is unclear when she has had a bowel movement or not.  Patient is unable to tell us when she is abdominal due to her underlying dementia.  Patient's family states that she is not been complaining of anything but CNA at the facility noticed that she had started vomiting on Sunday evening.  She is found to have severe abdominal distention and a CT of the abdomen pelvis was done which showed she is gaseous distention of the colon the very large stool burden identified within the rectum which favored a rectal impaction.  [facility] was called to admit this patient for abdominal distention and suspected rectal impaction and she started on a bowel regimen and given a suppository.  Additionally patient was found to have an AKI likely due to her poor p.o. intake.  ED  Course: In the ED she had basic blood work done as well as a CT of the abdomen pelvis.  She is also given a Fleet enema without much success.  EDP also give a 500 mL bolus and placed her on normal saline at a rate of 125 mL's per hour which I decreased down to 75 mL's per hour".  01/28/2018: Abdominal x-ray done today still reveals fecal impaction with dilated colon.  Acute kidney injury is resolving.  Potassium has gone up to 3.  We will continue to replete potassium.  Will repeat KUB in the morning.  Will repeat enema.  01/29/2018:  Repeat Abd X Ray continues to repeat rectal impaction and colonic dilatation.  01/30/2018: Patient continues to improve.  Continue daily enema.  Potassium is 2.9.  Sodium is 156.  Will change IV fluids to D5 water.  Will replete potassium.  We will pursue chest x-ray and abdominal x-ray.  Continue antibiotics for now.  Continue to monitor WBC.  Continue to assess for possible associated pneumonia versus atelectasis.  01/31/2018: Patient continues to improve.  Sodium is down to 142, potassium is 2.7.  Serum creatinine is 1.02.  Will replete potassium.  Continue to monitor electrolytes.  Continue IV fluids and SMOG for now.  02/01/2018: Patient continues to improve.  Sodium is back to normal range.  Potassium is back to normal range.  Low phosphorus noted.  Will replete.  Repeat abdominal KUB.  Patient looks a lot better today.  Will consult social worker for possible discharge in the morning.  Meanwhile, will  change IV fluids to half-normal saline with 20 of KCl at the rate of 75 cc an hour.  Assessment & Plan:   Active Problems:   Essential hypertension   Coronary atherosclerosis   Atrial fibrillation (HCC)   Diastolic CHF (HCC)   Ambulatory dysfunction   Parkinson disease (HCC)   Constipation   Dementia due to Parkinson's disease with behavioral disturbance (HCC)   Hypokalemia   AKI (acute kidney injury) (HCC)   Leukocytosis   Acute on chronic respiratory  failure with hypoxia (HCC)   Hyperbilirubinemia   Abdominal distention   Abdominal Distention in the setting of Severe Constipation/Obstipation -Place in Obs Telemetry -Tried Fleet Enema without success -Placed on bowel regimen with MiraLAX 17 g p.o. twice daily, senna docusate 1 tab p.o. twice daily, and try smog enema -If not improving will maybe consider consultation to gastroenterology -Replete electrolytes and maintain potassium above 4.0 and magnesium above 2  01/29/2018: GI consult called. Continue enema.  Trial of manual disimpaction was unsuccessful. 01/30/2018: Continue daily enema.  Abdominal x-ray.  Monitor and optimize electrolytes. 02/01/2018: Continue SMOG.  Repeat abdominal KUB.  Hypokalemia/abnormal electrolytes: -Patient's K+ on admission was severely low at 2.2 -Pleat with IV potassium 20 Norco once in the ED and I have given additional 60.  I have also started the patient on normal saline +40 mEq of KCl at a rate of 75 mils per hour -Continue monitor replete as necessary -Check magnesium level -Repeat CMP in a.m. 01/27/2018: Potassium is 3.4 today. 01/31/2018: Potassium is 2.7 today.  Will replete. 02/01/2018: Repleted.  Sodium is back to normal.  Low phosphorus is noted.  Replete phosphorus.  AKI -Check U/A and urinalysis -Obtain renal ultrasound -Bladder scan did not show very much however may be inaccurate so we will check renal ultrasound to see if the patient has any hydronephrosis -Patient is doing well except creatinine on admission was 76/3.13 -Avoid nephrotoxic medications if possible and avoid hypotension -Continue monitor and trend renal function repeat CMP in a.m. 01/31/2018: AKI is resolving.  Serum creatinine is 1.02 today. 02/01/2018: AKI has resolved.  Serum creatinine is 0.87 today.  Leukocytosis -? Reactive in the setting of Nausea,Vomiting and Dehydration and possible Aspiration -WBC was 18.1 and Neutrophil Count was 15.4 -Afebrile so do  Not feel as if it is infectious mediated as she has no abdominal pain but did sound a little diminished  -Given IV Abx in the ED with IV Zosyn and Metronidazole; Will continue IV Zosyn -Continue to Monitor -Check Blood Cx x2 -Repeat CBC in AM  01/28/2018: Repeat CBC in the morning.  No CBC visualized today.  Nasal swab for MRSA. 01/31/18: Resolving.  WBC is 11.8 today.  Acute Respiratory Failure with Hypoxia; ? Aspiration -O2 Saturations dropped to 87% on Room Air -Placed on 2 Liters and O2 Sat's improved to 93% -CT Scan showed Atelectasis with subplerual consolidation noted within the RLL -Check SLP -Check CXR -C/w NPO and Aspiration Precautions -WBC was 18.1 and repeat in AM -Start Xopenex/Atrovent Scheduled  01/28/2018: Continue to assess.  Repeat chest x-ray after patient is fully hydrated. 01/30/18: Chest x-ray today. 02/01/2018: Respiratory failure has resolved.  Complete course of antibiotics.  Hyperbilirubinemia -Mild at 1.4  Dementia and Parkinsons -SLP as above -C/w NPO for now -Aspiration Precautions -Delirium Precautions  -C/w Quetiapine 1-2 tabs po qHS  Chronic Diastolic CHF -Continue to Monitor due to Volume Resuscitation -Strict I's/O's; Daily Weights -C/w Metoprolol 50 mg po BID   HTN -Hold Amlodipine-Benezapril 5-20  mg po Daily currently   Hx of A Fib/SVT -C/w Metoprolol 50 mg po BID -C/w Telemetry   CAD -C/w ASA and Metoprolol   DVT prophylaxis: Heparin 5,000 units sq q8h Code Status: DO NOT RESUSCITATE  Family Communication: Discussed with Daughter Disposition Plan: Anticipate D/C back to SNF when Abdominal Distention is resolved and WBC trends down Consults called: None Admission status: Obs Telemetry  Procedures:   None  Antimicrobials:   IV Zosyn  Subjective: Patient is still unable to provide coherent history. Patient looks much better today.  Objective: Vitals:   01/31/18 1452 01/31/18 2220 02/01/18 0429 02/01/18 0502    BP: 124/66 118/71 112/72   Pulse: (!) 101 72 63   Resp: 14 16 16    Temp: 98.7 F (37.1 C) 97.7 F (36.5 C) 98.2 F (36.8 C)   TempSrc: Oral Oral Oral   SpO2: 100% 99% 98%   Weight:    77.1 kg  Height:        Intake/Output Summary (Last 24 hours) at 02/01/2018 1136 Last data filed at 02/01/2018 0850 Gross per 24 hour  Intake 3258.47 ml  Output -  Net 3258.47 ml   Filed Weights   01/29/18 0500 01/31/18 0353 02/01/18 0502  Weight: 75 kg 75.3 kg 77.1 kg    Examination:  General exam: Appears calm and comfortable.   Respiratory system: Clear to auscultation.  Cardiovascular system: S1 & S2  Gastrointestinal system: Abdomen is distended and nontender.  Organs are difficult to assess.   Central nervous system: Awake, more interactive.  Rigid  Data Reviewed: I have personally reviewed following labs and imaging studies  CBC: Recent Labs  Lab 01/27/18 1110 01/29/18 0408 01/30/18 0431 01/31/18 0548  WBC 18.1* 13.4* 13.3* 11.8*  NEUTROABS 15.4* 11.3* 11.1* 8.0*  HGB 14.9 14.8 13.7 12.2  HCT 47.9* 47.8* 44.2 38.1  MCV 82.7 82.0 82.2 79.7*  PLT 250 194 201 174   Basic Metabolic Panel: Recent Labs  Lab 01/27/18 1110  01/28/18 1423 01/29/18 0408 01/30/18 0431 01/31/18 0548 01/31/18 1715 02/01/18 0534  NA 141  --  148* 150*  149* 156* 148* 144 141  K 2.2*  --  3.0* 3.4*  3.4* 2.9* 2.7* 3.4* 3.7  CL 106  --  116* 121*  121* 125* 119* 118* 115*  CO2 19*  --  17* 18*  18* 22 21* 20* 20*  GLUCOSE 155*  --  114* 111*  109* 133* 112* 100* 116*  BUN 76*  --  62* 43*  43* 26* 12 9 11   CREATININE 3.13*  --  1.95* 1.42*  1.39* 1.15* 1.02* 0.86 0.87  CALCIUM 9.5  --  8.9 8.7*  8.7* 8.7* 8.2* 8.1* 7.8*  MG 2.8*  --  2.7* 2.7*  --  2.1  --   --   PHOS  --    < > 3.5 1.7*  1.6* <1.0* <0.1*  --  <1.0*   < > = values in this interval not displayed.   GFR: Estimated Creatinine Clearance: 50.4 mL/min (by C-G formula based on SCr of 0.87 mg/dL). Liver Function  Tests: Recent Labs  Lab 01/27/18 1110 01/28/18 1423 01/29/18 0408 01/30/18 0431 01/31/18 0548 02/01/18 0534  AST 28  --   --   --   --   --   ALT 23  --   --   --   --   --   ALKPHOS 87  --   --   --   --   --  BILITOT 1.4*  --   --   --   --   --   PROT 7.7  --   --   --   --   --   ALBUMIN 4.4 3.6 3.4*  3.4* 3.2* 2.8* 2.6*   Recent Labs  Lab 01/27/18 1110  LIPASE 63*   No results for input(s): AMMONIA in the last 168 hours. Coagulation Profile: No results for input(s): INR, PROTIME in the last 168 hours. Cardiac Enzymes: No results for input(s): CKTOTAL, CKMB, CKMBINDEX, TROPONINI in the last 168 hours. BNP (last 3 results) No results for input(s): PROBNP in the last 8760 hours. HbA1C: No results for input(s): HGBA1C in the last 72 hours. CBG: Recent Labs  Lab 01/28/18 0757 01/29/18 0756 01/30/18 0736 02/01/18 0757  GLUCAP 110* 103* 108* 111*   Lipid Profile: No results for input(s): CHOL, HDL, LDLCALC, TRIG, CHOLHDL, LDLDIRECT in the last 72 hours. Thyroid Function Tests: No results for input(s): TSH, T4TOTAL, FREET4, T3FREE, THYROIDAB in the last 72 hours. Anemia Panel: No results for input(s): VITAMINB12, FOLATE, FERRITIN, TIBC, IRON, RETICCTPCT in the last 72 hours. Urine analysis:    Component Value Date/Time   COLORURINE AMBER (A) 01/27/2018 1723   APPEARANCEUR CLOUDY (A) 01/27/2018 1723   LABSPEC 1.027 01/27/2018 1723   PHURINE 5.0 01/27/2018 1723   GLUCOSEU NEGATIVE 01/27/2018 1723   HGBUR NEGATIVE 01/27/2018 1723   BILIRUBINUR NEGATIVE 01/27/2018 1723   KETONESUR NEGATIVE 01/27/2018 1723   PROTEINUR 100 (A) 01/27/2018 1723   UROBILINOGEN 1.0 09/02/2013 1132   NITRITE NEGATIVE 01/27/2018 1723   LEUKOCYTESUR NEGATIVE 01/27/2018 1723   Sepsis Labs: @LABRCNTIP (procalcitonin:4,lacticidven:4)  ) Recent Results (from the past 240 hour(s))  Culture, Urine     Status: None   Collection Time: 01/27/18  5:23 PM  Result Value Ref Range Status    Specimen Description   Final    URINE, RANDOM Performed at [facility], [address] [place], [place] [address]    Special Requests   Final    NONE Performed at [facility], [address] [place], [place] [address]    Culture   Final    Multiple bacterial morphotypes present, none predominant. Suggest appropriate recollection if clinically indicated.   Report Status 01/29/2018 FINAL  Final  MRSA PCR Screening     Status: None   Collection Time: 01/29/18  2:13 AM  Result Value Ref Range Status   MRSA by PCR NEGATIVE NEGATIVE Final    Comment:        The GeneXpert MRSA Assay (FDA approved for NASAL specimens only), is one component of a comprehensive MRSA colonization surveillance program. It is not intended to diagnose MRSA infection nor to guide or monitor treatment for MRSA infections. Performed at [facility], [address] [place], [place] [address]          Radiology Studies: Dg Abd 1 View  Result Date: 01/30/2018 CLINICAL DATA:  ABDOMINAL DISTENTION EXAM: ABDOMEN - 1 VIEW COMPARISON:  KUB OF 01/29/2018 AND 01/28/2017 FINDINGS: There is little change in the gaseous distention of both large and small bowel. As noted previously this may be related to fecal impaction of the rectum or possibly a distal colonic lesion. 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 There are degenerative changes diffusely throughout the thoracolumbar spine. IMPRESSION: Little change in gaseous distention of large and small bowel. Electronically Signed   By: Lucienne MinksPaul  Barry M.D.  On: 01/30/2018 11:59   Dg Chest Port 1 View  Result Date: 01/30/2018 CLINICAL DATA:  History of atrial fibrillation, CHF, dementia EXAM: PORTABLE CHEST 1 VIEW COMPARISON:  Portable chest x-ray of 01/27/2018 FINDINGS: Aeration of the lungs has improved somewhat. There are still opacities at both lung bases which may  represent atelectasis or pneumonia. No definite effusion is seen. The heart is mildly enlarged and stable. IMPRESSION: 1. Improved aeration. Persistent basilar opacities may reflect atelectasis or pneumonia. 2. Stable mild cardiomegaly. Electronically Signed   By: Dwyane Dee M.D.   On: 01/30/2018 12:02        Scheduled Meds: . aspirin EC  81 mg Oral Daily  . heparin  5,000 Units Subcutaneous Q8H  . metoprolol tartrate  50 mg Oral BID  . polyethylene glycol  17 g Oral BID  . potassium & sodium phosphates  1 packet Oral TID WC & HS  . senna-docusate  1 tablet Oral BID  . sorbitol, milk of mag, mineral oil, glycerin (SMOG) enema  960 mL Rectal Daily  . [START ON 02/18/2018] Vitamin D (Ergocalciferol)  50,000 Units Oral Q30 days   Continuous Infusions: . 0.45 % NaCl with KCl 20 mEq / L    . piperacillin-tazobactam (ZOSYN)  IV 12.5 mL/hr at 02/01/18 0607  . potassium PHOSPHATE IVPB (in mmol)       LOS: 0 days    Time spent: 56 Minutes    Berton Mount, MD  [facility] Pager #: [phone] 7PM-7AM contact night coverage as above

## 2018-02-01 NOTE — Progress Notes (Signed)
Subjective: Patient denies abdominal pain. Having voluminous liquid and semisolid fecal output per nursing.  Objective: Vital signs in last 24 hours: Temp:  [97.7 F (36.5 C)-98.7 F (37.1 C)] 98.2 F (36.8 C) (12/29 0429) Pulse Rate:  [63-101] 63 (12/29 0429) Resp:  [14-16] 16 (12/29 0429) BP: (112-124)/(66-72) 112/72 (12/29 0429) SpO2:  [98 %-100 %] 98 % (12/29 0429) Weight:  [77.1 kg] 77.1 kg (12/29 0502) Weight change: 1.814 kg Last BM Date: 01/31/18  PE: GEN:  NAD ABD:  Mild distention and tympany, non-tender, no peritonitis  Lab Results: CBC    Component Value Date/Time   WBC 11.8 (H) 01/31/2018 0548   RBC 4.78 01/31/2018 0548   HGB 12.2 01/31/2018 0548   HCT 38.1 01/31/2018 0548   PLT 174 01/31/2018 0548   MCV 79.7 (L) 01/31/2018 0548   MCH 25.5 (L) 01/31/2018 0548   MCHC 32.0 01/31/2018 0548   RDW 15.7 (H) 01/31/2018 0548   LYMPHSABS 2.0 01/31/2018 0548   MONOABS 1.2 (H) 01/31/2018 0548   EOSABS 0.6 (H) 01/31/2018 0548   BASOSABS 0.0 01/31/2018 0548   ABD Xray:  Pending but by my read gaseous distention colon about same  Assessment:  1. Constipation, obstipation with fecal impaction in rectosigmoid colon with upstream colonic distention. Likely multifactorial (Parkinson's, poor mobility). Having lots of good fecal output. 2. Anal stenosis. This might be contributing to her difficulty in fecal output, as there was voluminous pudding-consistency stool output during rectal exam (which essentially transiently relieved her degree of anal stenosis). 3. Multiple medical problems.  Plan:  1.  Patient is having voluminous stool output, and I feel significant progress is being made (regardless of what her abdominal xray shows). 2.  Would continue enemas and might consider slow oral prep next week to help further improvement of patient's obstipation. 3.  [facility] will follow.   [name],[name] M 02/01/2018, 11:59 AM   Cell [phone] If no answer or  after 5 PM call [phone]

## 2018-02-02 ENCOUNTER — Inpatient Hospital Stay (HOSPITAL_COMMUNITY): Payer: Medicare Other

## 2018-02-02 LAB — CBC WITH DIFFERENTIAL/PLATELET
Abs Immature Granulocytes: 0.37 10*3/uL — ABNORMAL HIGH (ref 0.00–0.07)
Basophils Absolute: 0 10*3/uL (ref 0.0–0.1)
Basophils Relative: 0 %
Eosinophils Absolute: 0.5 10*3/uL (ref 0.0–0.5)
Eosinophils Relative: 2 %
HCT: 35.6 % — ABNORMAL LOW (ref 36.0–46.0)
Hemoglobin: 11.3 g/dL — ABNORMAL LOW (ref 12.0–15.0)
Immature Granulocytes: 2 %
Lymphocytes Relative: 9 %
Lymphs Abs: 1.8 10*3/uL (ref 0.7–4.0)
MCH: 25.5 pg — ABNORMAL LOW (ref 26.0–34.0)
MCHC: 31.7 g/dL (ref 30.0–36.0)
MCV: 80.4 fL (ref 80.0–100.0)
Monocytes Absolute: 1.4 10*3/uL — ABNORMAL HIGH (ref 0.1–1.0)
Monocytes Relative: 7 %
Neutro Abs: 15.7 10*3/uL — ABNORMAL HIGH (ref 1.7–7.7)
Neutrophils Relative %: 80 %
Platelets: 199 10*3/uL (ref 150–400)
RBC: 4.43 MIL/uL (ref 3.87–5.11)
RDW: 15.8 % — ABNORMAL HIGH (ref 11.5–15.5)
WBC: 19.8 10*3/uL — ABNORMAL HIGH (ref 4.0–10.5)
nRBC: 0 % (ref 0.0–0.2)

## 2018-02-02 LAB — RENAL FUNCTION PANEL
Albumin: 2.4 g/dL — ABNORMAL LOW (ref 3.5–5.0)
Anion gap: 7 (ref 5–15)
BUN: 23 mg/dL (ref 8–23)
CO2: 21 mmol/L — ABNORMAL LOW (ref 22–32)
Calcium: 7.7 mg/dL — ABNORMAL LOW (ref 8.9–10.3)
Chloride: 114 mmol/L — ABNORMAL HIGH (ref 98–111)
Creatinine, Ser: 0.83 mg/dL (ref 0.44–1.00)
GFR calc Af Amer: >60 mL/min (ref >60)
GFR calc non Af Amer: >60 mL/min (ref >60)
Glucose, Bld: 126 mg/dL — ABNORMAL HIGH (ref 70–99)
Phosphorus: 2 mg/dL — ABNORMAL LOW (ref 2.5–4.6)
Potassium: 2.5 mmol/L — CL (ref 3.5–5.1)
Sodium: 142 mmol/L (ref 135–145)

## 2018-02-02 LAB — PROCALCITONIN: Procalcitonin: 2.24 ng/mL

## 2018-02-02 LAB — GLUCOSE, CAPILLARY: Glucose-Capillary: 109 mg/dL — ABNORMAL HIGH (ref 70–99)

## 2018-02-02 LAB — MAGNESIUM: Magnesium: 2.3 mg/dL (ref 1.7–2.4)

## 2018-02-02 LAB — POTASSIUM: Potassium: 3.5 mmol/L (ref 3.5–5.1)

## 2018-02-02 MED ORDER — POTASSIUM CHLORIDE 10 MEQ/100ML IV SOLN
10.0000 meq | INTRAVENOUS | Status: AC
  Administered 2018-02-02 (×3): 10 meq via INTRAVENOUS
  Filled 2018-02-02 (×3): qty 100

## 2018-02-02 MED ORDER — POTASSIUM CHLORIDE 10 MEQ/100ML IV SOLN
10.0000 meq | Freq: Once | INTRAVENOUS | Status: AC
  Administered 2018-02-02: 10 meq via INTRAVENOUS
  Filled 2018-02-02: qty 100

## 2018-02-02 MED ORDER — POTASSIUM CHLORIDE 20 MEQ PO PACK
40.0000 meq | PACK | Freq: Three times a day (TID) | ORAL | Status: DC
  Administered 2018-02-02 – 2018-02-03 (×2): 40 meq via ORAL
  Filled 2018-02-02 (×5): qty 2

## 2018-02-02 MED ORDER — PEG 3350-KCL-NA BICARB-NACL 420 G PO SOLR
4000.0000 mL | Freq: Once | ORAL | Status: AC
  Administered 2018-02-02: 4000 mL via ORAL

## 2018-02-02 NOTE — Progress Notes (Signed)
Subjective: The patient was seen and examined at bedside. She appears drowsy/sleeping, denies abdominal pain, nausea or vomiting.  Objective: Vital signs in last 24 hours: Temp:  [97.6 F (36.4 C)-99 F (37.2 C)] 99 F (37.2 C) (12/30 0616) Pulse Rate:  [73-101] 76 (12/30 0616) Resp:  [16-24] 16 (12/30 0616) BP: (91-123)/(51-75) 93/51 (12/30 0634) SpO2:  [97 %-99 %] 97 % (12/30 0616) Weight:  [76.7 kg] 76.7 kg (12/30 0611) Weight change: -0.454 kg Last BM Date: 02/01/18  ZO:XWRUEAVPE:elderly, frail-appearing GENERAL:dry oral mucosa ABDOMEN:nondistended, infrequent bowel sounds, nontender EXTREMITIES:no edema  Lab Results: Results for orders placed or performed during the hospital encounter of 01/27/18 (from the past 48 hour(s))  Basic metabolic panel     Status: Abnormal   Collection Time: 01/31/18  5:15 PM  Result Value Ref Range   Sodium 144 135 - 145 mmol/L   Potassium 3.4 (L) 3.5 - 5.1 mmol/L    Comment: DELTA CHECK NOTED NO VISIBLE HEMOLYSIS    Chloride 118 (H) 98 - 111 mmol/L   CO2 20 (L) 22 - 32 mmol/L   Glucose, Bld 100 (H) 70 - 99 mg/dL   BUN 9 8 - 23 mg/dL   Creatinine, Ser 4.090.86 0.44 - 1.00 mg/dL   Calcium 8.1 (L) 8.9 - 10.3 mg/dL   GFR calc non Af Amer >60 >60 mL/min   GFR calc Af Amer >60 >60 mL/min   Anion gap 6 5 - 15    Comment: Performed at [facility], [address] [place], [place] [address]  Renal function panel     Status: Abnormal   Collection Time: 02/01/18  5:34 AM  Result Value Ref Range   Sodium 141 135 - 145 mmol/L   Potassium 3.7 3.5 - 5.1 mmol/L   Chloride 115 (H) 98 - 111 mmol/L   CO2 20 (L) 22 - 32 mmol/L   Glucose, Bld 116 (H) 70 - 99 mg/dL   BUN 11 8 - 23 mg/dL   Creatinine, Ser 4.780.87 0.44 - 1.00 mg/dL   Calcium 7.8 (L) 8.9 - 10.3 mg/dL   Phosphorus <2.9<1.0 (LL) 2.5 - 4.6 mg/dL    Comment: CRITICAL RESULT CALLED TO, READ BACK BY AND VERIFIED WITH: C ELLIS,RN 02/01/18 0623 RHOLMES    Albumin 2.6 (L) 3.5 - 5.0 g/dL   GFR  calc non Af Amer >60 >60 mL/min   GFR calc Af Amer >60 >60 mL/min   Anion gap 6 5 - 15    Comment: Performed at [facility], [address] [place], [place] [address]  Glucose, capillary     Status: Abnormal   Collection Time: 02/01/18  7:57 AM  Result Value Ref Range   Glucose-Capillary 111 (H) 70 - 99 mg/dL   Comment 1 Notify RN    Comment 2 Document in Chart   Renal function panel     Status: Abnormal   Collection Time: 02/02/18  4:32 AM  Result Value Ref Range   Sodium 142 135 - 145 mmol/L   Potassium 2.5 (LL) 3.5 - 5.1 mmol/L    Comment: CRITICAL RESULT CALLED TO, READ BACK BY AND VERIFIED WITH: C,ELLIS AT 0555 ON 02/02/18 BY A,MOHAMED DELTA CHECK NOTED    Chloride 114 (H) 98 - 111 mmol/L   CO2 21 (L) 22 - 32 mmol/L   Glucose, Bld 126 (H) 70 - 99 mg/dL   BUN 23 8 - 23 mg/dL   Creatinine, Ser 0.860.83 0.44 - 1.00 mg/dL  Calcium 7.7 (L) 8.9 - 10.3 mg/dL   Phosphorus 2.0 (L) 2.5 - 4.6 mg/dL   Albumin 2.4 (L) 3.5 - 5.0 g/dL   GFR calc non Af Amer >60 >60 mL/min   GFR calc Af Amer >60 >60 mL/min   Anion gap 7 5 - 15    Comment: Performed at [facility], [address] [place], [place] [address]  CBC with Differential/Platelet     Status: Abnormal   Collection Time: 02/02/18  4:32 AM  Result Value Ref Range   WBC 19.8 (H) 4.0 - 10.5 K/uL   RBC 4.43 3.87 - 5.11 MIL/uL   Hemoglobin 11.3 (L) 12.0 - 15.0 g/dL   HCT 53.635.6 (L) 64.436.0 - 03.446.0 %   MCV 80.4 80.0 - 100.0 fL   MCH 25.5 (L) 26.0 - 34.0 pg   MCHC 31.7 30.0 - 36.0 g/dL   RDW 74.215.8 (H) 59.511.5 - 63.815.5 %   Platelets 199 150 - 400 K/uL   nRBC 0.0 0.0 - 0.2 %   Neutrophils Relative % 80 %   Neutro Abs 15.7 (H) 1.7 - 7.7 K/uL   Lymphocytes Relative 9 %   Lymphs Abs 1.8 0.7 - 4.0 K/uL   Monocytes Relative 7 %   Monocytes Absolute 1.4 (H) 0.1 - 1.0 K/uL   Eosinophils Relative 2 %   Eosinophils Absolute 0.5 0.0 - 0.5 K/uL   Basophils Relative 0 %   Basophils Absolute 0.0 0.0 - 0.1 K/uL    Immature Granulocytes 2 %   Abs Immature Granulocytes 0.37 (H) 0.00 - 0.07 K/uL    Comment: Performed at [facility], [address] [place], [place] [address]  Magnesium     Status: None   Collection Time: 02/02/18  4:32 AM  Result Value Ref Range   Magnesium 2.3 1.7 - 2.4 mg/dL    Comment: Performed at [facility], [address] [place], [place] [address]  Glucose, capillary     Status: Abnormal   Collection Time: 02/02/18  7:35 AM  Result Value Ref Range   Glucose-Capillary 109 (H) 70 - 99 mg/dL    Studies/Results: Dg Abd 1 View  Result Date: 02/01/2018 CLINICAL DATA:  Abdominal distension. EXAM: ABDOMEN - 1 VIEW COMPARISON:  Abdominal radiograph 01/30/2018 FINDINGS: Read demonstrated marked gaseous distention of multiple loops of large and small bowel throughout the abdomen. Supine evaluation limited for the detection of free intraperitoneal air. Lumbar spine degenerative changes. Unchanged calcified mass left upper quadrant. IMPRESSION: Read demonstrated gaseous distended loops of large and small bowel throughout the abdomen. Electronically Signed   By: Annia Beltrew  Davis M.D.   On: 02/01/2018 12:06    Medications: I have reviewed the patient's current medications.  Assessment: Constipation/obstipation with fecal impaction in rectosigmoid with upstream colonic dilatation-could be related to colonic ileus/poor mobility from underlying Parkinson's, complicated with anal stenosis  Plan: Replace potassium with the intention to keep it at least around 4-4.5, patient getting4 IV rounds of 10 mEq, will start on oral potassium 40 mEq then 3 doses and recheck potassium at 6 PM today. Repeat abdominal x-ray. Continue clear liquid diet. Start patient on oral colonic prep/GoLYTELY, discussed with patient's nurse, that the intention is to allow patient to take small sips throughout the day and likely finish the prep within a day or 2.   Kerin Salenrya  Wilene Pharo 02/02/2018, 8:45 AM   Pager [phone] If no answer or after 5 PM call [phone]

## 2018-02-02 NOTE — Progress Notes (Signed)
CRITICAL VALUE STICKER  CRITICAL VALUE: K+ 2.5  RECEIVER (on-site recipient of call): Baird LyonsCasey, RN  DATE & TIME NOTIFIED: 02/02/2018 0556  MESSENGER (representative from lab): Vernona RiegerIyesha  MD NOTIFIED: [place]  TIME OF NOTIFICATION: 0609  RESPONSE: Awaiting new orders

## 2018-02-02 NOTE — Progress Notes (Signed)
PROGRESS NOTE    [name] KinsBarbara J Traum  OZH:[id number] DOB: 05/03/1940 DOA: 01/27/2018 PCP: Nadara EatonPiazza, Michael J, MD  Outpatient Specialists:   Brief Narrative:  [name] is a 77 y.o. female with medical history significant of atrial fibrillation, chronic diastolic CHF, CAD, history of hysterectomy, hyperlipidemia, prediabetes, Parkinson's disease and dementia, history of CVA with right-sided weakness, history of heart murmur and other comorbidities who presents with abdominal distention, poor p.o. intake, as well as dehydration and inability to defecate.  Patient was unable to provide any significant history due to her advanced dementia and Parkinson's disease.  Currently stays at a skilled nursing facility and reportedly the staff there states that she has had poor p.o. intake and has been eating since Saturday and has not been drinking at all.  The CNA at the SNF noticed that she had started vomiting prior to presentation.  Patient was found to have severe abdominal distention and CT of the abdomen pelvis showed gaseous distention of the colon the very large stool burden identified within the rectum which favored a rectal impaction.  Patient was found to have an AKI likely due to her poor p.o. intake.  02/02/2018: GI team is assisting in directing patient's care.  Abdominal x-ray has been done repeatedly.  Significant improvement is noted.  Patient was started on GoLYTELY earlier today.  Prior to GoLYTELY, patient has been on SMOG (enema on a daily basis).  Acute kidney injury has resolved.  At some point, patient was hypernatremic, but this resolved with D5 water.  Hypokalemia has been persistent.  Patient is currently on potassium supplement 40 M EQ every 8 hours.  Currently monitor electrolytes closely and adjust as deemed necessary.  Acute kidney injury has resolved, and most likely prerenal.  There are concerns for possible pneumonic process.  Patient is on IV Zosyn.  Overall, patient has improved  significantly since admission.  Further management will depend on hospital course.   Assessment & Plan:   Active Problems:   Essential hypertension   Coronary atherosclerosis   Atrial fibrillation (HCC)   Diastolic CHF (HCC)   Ambulatory dysfunction   Parkinson disease (HCC)   Constipation   Dementia due to Parkinson's disease with behavioral disturbance (HCC)   Hypokalemia   AKI (acute kidney injury) (HCC)   Leukocytosis   Acute on chronic respiratory failure with hypoxia (HCC)   Hyperbilirubinemia   Abdominal distention   Abdominal Distention in the setting of Severe Constipation/Obstipation -Place in Obs Telemetry -Tried Fleet Enema without success -Placed on bowel regimen with MiraLAX 17 g p.o. twice daily, senna docusate 1 tab p.o. twice daily, and try smog enema -If not improving will maybe consider consultation to gastroenterology -Replete electrolytes and maintain potassium above 4.0 and magnesium above 2  01/29/2018: GI consult called. Continue enema.  Trial of manual disimpaction was unsuccessful. 01/30/2018: Continue daily enema.  Abdominal x-ray.  Monitor and optimize electrolytes. 02/01/2018: Continue SMOG.  Repeat abdominal KUB. 02/02/2018: Started on GoLYTELY by the GI team.  Continue to monitor closely.  Hypokalemia/abnormal electrolytes: -Patient's K+ on admission was severely low at 2.2 -Pleat with IV potassium 20 Norco once in the ED and I have given additional 60.  I have also started the patient on normal saline +40 mEq of KCl at a rate of 75 mils per hour -Continue monitor replete as necessary -Check magnesium level -Repeat CMP in a.m. 01/27/2018: Potassium is 3.4 today. 01/31/2018: Potassium is 2.7 today.  Will replete. 02/01/2018: Repleted.  Sodium is back  to normal.  Low phosphorus is noted.  Replete phosphorus. 02/02/2018: Monitor closely.  Patient is currently on potassium supplement 40 M EQ p.o. every 8 hourly.  AKI -Check U/A and  urinalysis -Obtain renal ultrasound -Bladder scan did not show very much however may be inaccurate so we will check renal ultrasound to see if the patient has any hydronephrosis -Patient is doing well except creatinine on admission was 76/3.13 -Avoid nephrotoxic medications if possible and avoid hypotension -Continue monitor and trend renal function repeat CMP in a.m. 01/31/2018: AKI is resolving.  Serum creatinine is 1.02 today. 02/01/2018: AKI has resolved.  Serum creatinine is 0.87 today.  Leukocytosis -? Reactive in the setting of Nausea,Vomiting and Dehydration and possible Aspiration -WBC was 18.1 and Neutrophil Count was 15.4 -Afebrile so do Not feel as if it is infectious mediated as she has no abdominal pain but did sound a little diminished  -Given IV Abx in the ED with IV Zosyn and Metronidazole; Will continue IV Zosyn -Continue to Monitor -Check Blood Cx x2 -Repeat CBC in AM  01/28/2018: Repeat CBC in the morning.  No CBC visualized today.  Nasal swab for MRSA. 01/31/18: Resolving.  WBC is 11.8 today. 02/02/2018: Worsening leukocytosis noted today (WBC of 19.8).  Procalcitonin was also greater than 2.  Speech is evaluating patient to rule out aspiration.  Continue IV Zosyn for now.  Continue to monitor WBC, and manage accordingly.  Acute Respiratory Failure with Hypoxia; ? Aspiration -O2 Saturations dropped to 87% on Room Air -Placed on 2 Liters and O2 Sat's improved to 93% -CT Scan showed Atelectasis with subplerual consolidation noted within the RLL -Check SLP -Check CXR -C/w NPO and Aspiration Precautions -WBC was 18.1 and repeat in AM -Start Xopenex/Atrovent Scheduled  01/28/2018: Continue to assess.  Repeat chest x-ray after patient is fully hydrated. 01/30/18: Chest x-ray today. 02/01/2018: Respiratory failure has resolved.  Complete course of antibiotics.  Dementia and Parkinsons -SLP as above -C/w NPO for now -Aspiration Precautions -Delirium Precautions   -C/w Quetiapine 1-2 tabs po qHS  Chronic Diastolic CHF -Continue to Monitor due to Volume Resuscitation -Strict I's/O's; Daily Weights -C/w Metoprolol 50 mg po BID  02/02/2018: This is stable.  HTN -Hold Amlodipine-Benezapril 5-20 mg po Daily currently  02/02/2018: Continue to optimize.  Hx of A Fib/SVT -C/w Metoprolol 50 mg po BID -C/w Telemetry   CAD -C/w ASA and Metoprolol   DVT prophylaxis: Heparin 5,000 units sq q8h Code Status: DO NOT RESUSCITATE  Family Communication: Discussed with Daughter Disposition Plan: Anticipate D/C back to SNF when Abdominal Distention is resolved and WBC trends down Consults called: None Admission status: Obs Telemetry  Procedures:   None  Antimicrobials:   IV Zosyn  Subjective: Patient is not very interactive when I saw patient. However, the nursing staff reported that attempts to draw blood from patient was painful. Patient was reported to have been more interactive earlier.  Objective: Vitals:   02/02/18 0616 02/02/18 0634 02/02/18 0953 02/02/18 1328  BP: (!) 91/52 (!) 93/51 97/62 (!) 97/57  Pulse: 76  81 75  Resp: 16   20  Temp: 99 F (37.2 C)   99.3 F (37.4 C)  TempSrc: Oral   Oral  SpO2: 97%   100%  Weight:      Height:        Intake/Output Summary (Last 24 hours) at 02/02/2018 1848 Last data filed at 02/02/2018 1300 Gross per 24 hour  Intake 1841.16 ml  Output -  Net 1841.16  ml   Filed Weights   01/31/18 0353 02/01/18 0502 02/02/18 0611  Weight: 75.3 kg 77.1 kg 76.7 kg    Examination:  General exam: Appears calm and comfortable.   Respiratory system: Clear to auscultation.  Cardiovascular system: S1 & S2  Gastrointestinal system: Abdomen is distended and nontender.  Organs are difficult to assess.   Central nervous system: Awake, more interactive.  Rigid  Data Reviewed: I have personally reviewed following labs and imaging studies  CBC: Recent Labs  Lab 01/27/18 1110 01/29/18 0408  01/30/18 0431 01/31/18 0548 02/02/18 0432  WBC 18.1* 13.4* 13.3* 11.8* 19.8*  NEUTROABS 15.4* 11.3* 11.1* 8.0* 15.7*  HGB 14.9 14.8 13.7 12.2 11.3*  HCT 47.9* 47.8* 44.2 38.1 35.6*  MCV 82.7 82.0 82.2 79.7* 80.4  PLT 250 194 201 174 199   Basic Metabolic Panel: Recent Labs  Lab 01/27/18 1110  01/28/18 1423 01/29/18 0408 01/30/18 0431 01/31/18 0548 01/31/18 1715 02/01/18 0534 02/02/18 0432  NA 141  --  148* 150*  149* 156* 148* 144 141 142  K 2.2*  --  3.0* 3.4*  3.4* 2.9* 2.7* 3.4* 3.7 2.5*  CL 106  --  116* 121*  121* 125* 119* 118* 115* 114*  CO2 19*  --  17* 18*  18* 22 21* 20* 20* 21*  GLUCOSE 155*  --  114* 111*  109* 133* 112* 100* 116* 126*  BUN 76*  --  62* 43*  43* 26* 12 9 11 23   CREATININE 3.13*  --  1.95* 1.42*  1.39* 1.15* 1.02* 0.86 0.87 0.83  CALCIUM 9.5  --  8.9 8.7*  8.7* 8.7* 8.2* 8.1* 7.8* 7.7*  MG 2.8*  --  2.7* 2.7*  --  2.1  --   --  2.3  PHOS  --    < > 3.5 1.7*  1.6* <1.0* <0.1*  --  <1.0* 2.0*   < > = values in this interval not displayed.   GFR: Estimated Creatinine Clearance: 52.7 mL/min (by C-G formula based on SCr of 0.83 mg/dL). Liver Function Tests: Recent Labs  Lab 01/27/18 1110  01/29/18 0408 01/30/18 0431 01/31/18 0548 02/01/18 0534 02/02/18 0432  AST 28  --   --   --   --   --   --   ALT 23  --   --   --   --   --   --   ALKPHOS 87  --   --   --   --   --   --   BILITOT 1.4*  --   --   --   --   --   --   PROT 7.7  --   --   --   --   --   --   ALBUMIN 4.4   < > 3.4*  3.4* 3.2* 2.8* 2.6* 2.4*   < > = values in this interval not displayed.   Recent Labs  Lab 01/27/18 1110  LIPASE 63*   No results for input(s): AMMONIA in the last 168 hours. Coagulation Profile: No results for input(s): INR, PROTIME in the last 168 hours. Cardiac Enzymes: No results for input(s): CKTOTAL, CKMB, CKMBINDEX, TROPONINI in the last 168 hours. BNP (last 3 results) No results for input(s): PROBNP in the last 8760 hours. HbA1C: No  results for input(s): HGBA1C in the last 72 hours. CBG: Recent Labs  Lab 01/28/18 0757 01/29/18 0756 01/30/18 0736 02/01/18 0757 02/02/18 0735  GLUCAP 110* 103* 108*  111* 109*   Lipid Profile: No results for input(s): CHOL, HDL, LDLCALC, TRIG, CHOLHDL, LDLDIRECT in the last 72 hours. Thyroid Function Tests: No results for input(s): TSH, T4TOTAL, FREET4, T3FREE, THYROIDAB in the last 72 hours. Anemia Panel: No results for input(s): VITAMINB12, FOLATE, FERRITIN, TIBC, IRON, RETICCTPCT in the last 72 hours. Urine analysis:    Component Value Date/Time   COLORURINE AMBER (A) 01/27/2018 1723   APPEARANCEUR CLOUDY (A) 01/27/2018 1723   LABSPEC 1.027 01/27/2018 1723   PHURINE 5.0 01/27/2018 1723   GLUCOSEU NEGATIVE 01/27/2018 1723   HGBUR NEGATIVE 01/27/2018 1723   BILIRUBINUR NEGATIVE 01/27/2018 1723   KETONESUR NEGATIVE 01/27/2018 1723   PROTEINUR 100 (A) 01/27/2018 1723   UROBILINOGEN 1.0 09/02/2013 1132   NITRITE NEGATIVE 01/27/2018 1723   LEUKOCYTESUR NEGATIVE 01/27/2018 1723   Sepsis Labs: @LABRCNTIP (procalcitonin:4,lacticidven:4)  ) Recent Results (from the past 240 hour(s))  Culture, Urine     Status: None   Collection Time: 01/27/18  5:23 PM  Result Value Ref Range Status   Specimen Description   Final    URINE, RANDOM Performed at [facility], [address], [place], [place] [address]    Special Requests   Final    NONE Performed at [facility], [address], [place], [place] [address]    Culture   Final    Multiple bacterial morphotypes present, none predominant. Suggest appropriate recollection if clinically indicated.   Report Status 01/29/2018 FINAL  Final  MRSA PCR Screening     Status: None   Collection Time: 01/29/18  2:13 AM  Result Value Ref Range Status   MRSA by PCR NEGATIVE NEGATIVE Final    Comment:        The GeneXpert MRSA Assay (FDA approved for NASAL specimens only), is one component of  a comprehensive MRSA colonization surveillance program. It is not intended to diagnose MRSA infection nor to guide or monitor treatment for MRSA infections. Performed at [facility], [address], [place], [place] [address]          Radiology Studies: Dg Abd 1 View  Result Date: 02/02/2018 CLINICAL DATA:  Abdominal distention. EXAM: ABDOMEN - 1 VIEW COMPARISON:  02/01/2018, 01/30/2018.  CT 01/27/2018. FINDINGS: Persistent bowel distention particularly of the sigmoid is again noted. Similar findings noted on prior exam. No free air. Calcific density left upper quadrant unchanged. Degenerative changes scoliosis thoracic and lumbar spine again noted. IMPRESSION: Persistent bowel distention particularly of the sigmoid colon. No significant change from prior exam. Electronically Signed   By: Maisie Fus  Register   On: 02/02/2018 09:57   Dg Abd 1 View  Result Date: 02/01/2018 CLINICAL DATA:  Abdominal distension. EXAM: ABDOMEN - 1 VIEW COMPARISON:  Abdominal radiograph 01/30/2018 FINDINGS: Read demonstrated marked gaseous distention of multiple loops of large and small bowel throughout the abdomen. Supine evaluation limited for the detection of free intraperitoneal air. Lumbar spine degenerative changes. Unchanged calcified mass left upper quadrant. IMPRESSION: Read demonstrated gaseous distended loops of large and small bowel throughout the abdomen. Electronically Signed   By: Annia Belt M.D.   On: 02/01/2018 12:06        Scheduled Meds: . aspirin EC  81 mg Oral Daily  . heparin  5,000 Units Subcutaneous Q8H  . metoprolol tartrate  50 mg Oral BID  . potassium & sodium phosphates  1 packet Oral TID WC & HS  . potassium chloride  40 mEq Oral TID  . [START ON 02/18/2018]  Vitamin D (Ergocalciferol)  50,000 Units Oral Q30 days   Continuous Infusions: . 0.45 % NaCl with KCl 20 mEq / L 50 mL/hr at 02/02/18 0950  . piperacillin-tazobactam (ZOSYN)  IV 3.375 g (02/02/18  1658)     LOS: 1 day    Time spent: 25 Minutes    Berton Mount, MD  [facility] Pager #: [phone] 7PM-7AM contact night coverage as above

## 2018-02-02 NOTE — Evaluation (Signed)
Clinical/Bedside Swallow Evaluation Patient Details  Name: [name] KinsBarbara J [name] MRN: [id number] Date of Birth: 12/06/1940  Today's Date: 02/02/2018 Time: SLP Start Time (ACUTE ONLY): 1446 SLP Stop Time (ACUTE ONLY): 1455 SLP Time Calculation (min) (ACUTE ONLY): 9 min  Past Medical History:  Past Medical History:  Diagnosis Date  . Anxiety disorder   . Atrial fibrillation (HCC)    a. new 04/2011;  b. Pradaxa  . Chronic diastolic heart failure (HCC)    Echocardiogram 04/14/11: Moderate LVH, EF 60-65%, mild LAE.  Marland Kitchen. 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 Gastritis   . H/O: hysterectomy   . Heart murmur    from age 77 associated with some hemoptysis  . Hypercholesteremia   . Hypertension   . Impaired glucose tolerance   . Obesity   . Osteoarthritis   . Parkinson's disease (HCC)    patient denies having parkinsons  . Stroke [facility](HCC) 2007   Residual R sided weakness, uses a wheelchair, walker at home   Past Surgical History:  Past Surgical History:  Procedure Laterality Date  . CARPAL TUNNEL RELEASE     right  . 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 LEFT HEART CATHETERIZATION WITH CORONARY ANGIOGRAM N/A 05/03/2011   Procedure: LEFT HEART CATHETERIZATION WITH CORONARY ANGIOGRAM;  Surgeon: Herby Abrahamhomas D Stuckey, MD;  [facility];  Service: Cardiovascular;  Laterality: N/A;  . REPLACEMENT TOTAL KNEE     right  . TONSILLECTOMY     HPI:  [name] is a 77 y.o. female resident of SNF with medical history significant for atrial fibrillation, chronic diastolic CHF, CAD, history of hysterectomy, hyperlipidemia, prediabetes, Parkinson's disease and advanced dementia, history of CVA with right-sided weakness, history of heart murmur and other comorbidities who presents with abdominal distention, poor p.o. intake, as well as dehydration and inability to defecate.  Initially with  severe constipation (GI following), hypokalemia (resolved), AKI (resolved). Now with concerns for aspiration per notes.  Last CXR 12/27 with improved aeration, persistent basilar opacities reflecting atx or pna per radiologist.    Assessment / Plan / Recommendation Clinical Impression  Clinical swallow exam limited to thin liquids given diet restrictions, however, pt demonstrated no focal CN deficits.  Unable to f/c at baseline, but conversing and pleasant despite confusion.  Initially, she demonstrated difficulty executing a motor plan for sipping from straw, edge of cup, but ultimately she spontaneously began drinking.  There were no overt s/s of aspiration despite large, successive boluses. No oral holding or anterior spillage.  Pts who are dependent on others for feeding and oral care are at the highest risk for developing aspiration pnas.  Today, pt appeared to protect her airway well when seated upright and offered careful hand-feeding.  Continue thin liquids per GI.  No acute SLP needs identified - our service will sign off.  SLP Visit Diagnosis: Dysphagia, unspecified (R13.10)    Aspiration Risk  No limitations    Diet Recommendation   per GI.  Medication Administration: Whole meds with liquid    Other  Recommendations Oral Care Recommendations: Oral care BID   Follow up Recommendations None      Frequency and Duration            Prognosis        Swallow Study   General HPI: [name] is a 77 y.o.  female resident of SNF with medical history significant for atrial fibrillation, chronic diastolic CHF, CAD, history of hysterectomy, hyperlipidemia, prediabetes, Parkinson's disease and advanced dementia, history of CVA with right-sided weakness, history of heart murmur and other comorbidities who presents with abdominal distention, poor p.o. intake, as well as dehydration and inability to defecate.  Initially with severe constipation (GI following), hypokalemia (resolved), AKI  (resolved). Now with concerns for aspiration per notes; npo.  Last CXR 12/27 with improved aeration, persistent basilar opacities reflecting atx or pna per radiologist.  Type of Study: Bedside Swallow Evaluation Previous Swallow Assessment: no Diet Prior to this Study: Thin liquids Temperature Spikes Noted: No Respiratory Status: Room air History of Recent Intubation: No Behavior/Cognition: Alert;Confused Oral Cavity Assessment: Within Functional Limits Oral Care Completed by SLP: No Oral Cavity - Dentition: Adequate natural dentition Vision: Functional for self-feeding Self-Feeding Abilities: Total assist Patient Positioning: Upright in bed Baseline Vocal Quality: Normal Volitional Cough: Cognitively unable to elicit Volitional Swallow: Unable to elicit    Oral/Motor/Sensory Function Overall Oral Motor/Sensory Function: (symmetry at baseline)   Ice Chips Ice chips: Within functional limits   Thin Liquid Thin Liquid: Within functional limits    Nectar Thick Nectar Thick Liquid: Not tested   Honey Thick Honey Thick Liquid: Not tested   Puree Puree: Not tested   Solid     Solid: Not tested      Blenda Mountsouture, Burdette Gergely Laurice 02/02/2018,2:55 PM Marchelle FolksAmanda L. Samson Fredericouture, MA CCC/SLP Acute Rehabilitation Services Office number [phone] Pager [phone]

## 2018-02-02 NOTE — Plan of Care (Signed)
  Problem: Safety: Goal: Ability to remain free from injury will improve Outcome: Progressing   

## 2018-02-03 ENCOUNTER — Inpatient Hospital Stay (HOSPITAL_COMMUNITY): Payer: Medicare Other

## 2018-02-03 ENCOUNTER — Encounter (HOSPITAL_COMMUNITY): Payer: Self-pay | Admitting: Cardiology

## 2018-02-03 DIAGNOSIS — Z789 Other specified health status: Secondary | ICD-10-CM

## 2018-02-03 DIAGNOSIS — J9621 Acute and chronic respiratory failure with hypoxia: Secondary | ICD-10-CM

## 2018-02-03 DIAGNOSIS — I480 Paroxysmal atrial fibrillation: Secondary | ICD-10-CM

## 2018-02-03 LAB — CBC WITH DIFFERENTIAL/PLATELET
Abs Immature Granulocytes: 0.24 10*3/uL — ABNORMAL HIGH (ref 0.00–0.07)
Basophils Absolute: 0 10*3/uL (ref 0.0–0.1)
Basophils Relative: 0 %
Eosinophils Absolute: 0.7 10*3/uL — ABNORMAL HIGH (ref 0.0–0.5)
Eosinophils Relative: 5 %
HCT: 30.5 % — ABNORMAL LOW (ref 36.0–46.0)
Hemoglobin: 9.6 g/dL — ABNORMAL LOW (ref 12.0–15.0)
Immature Granulocytes: 2 %
Lymphocytes Relative: 20 %
Lymphs Abs: 2.7 10*3/uL (ref 0.7–4.0)
MCH: 25.6 pg — ABNORMAL LOW (ref 26.0–34.0)
MCHC: 31.5 g/dL (ref 30.0–36.0)
MCV: 81.3 fL (ref 80.0–100.0)
Monocytes Absolute: 1 10*3/uL (ref 0.1–1.0)
Monocytes Relative: 8 %
Neutro Abs: 8.8 10*3/uL — ABNORMAL HIGH (ref 1.7–7.7)
Neutrophils Relative %: 65 %
Platelets: 183 10*3/uL (ref 150–400)
RBC: 3.75 MIL/uL — ABNORMAL LOW (ref 3.87–5.11)
RDW: 16 % — ABNORMAL HIGH (ref 11.5–15.5)
WBC: 13.6 10*3/uL — ABNORMAL HIGH (ref 4.0–10.5)
nRBC: 0 % (ref 0.0–0.2)

## 2018-02-03 LAB — RENAL FUNCTION PANEL
Albumin: 2.2 g/dL — ABNORMAL LOW (ref 3.5–5.0)
Anion gap: 6 (ref 5–15)
BUN: 28 mg/dL — ABNORMAL HIGH (ref 8–23)
CO2: 20 mmol/L — ABNORMAL LOW (ref 22–32)
Calcium: 7.4 mg/dL — ABNORMAL LOW (ref 8.9–10.3)
Chloride: 116 mmol/L — ABNORMAL HIGH (ref 98–111)
Creatinine, Ser: 0.84 mg/dL (ref 0.44–1.00)
GFR calc Af Amer: >60 mL/min (ref >60)
GFR calc non Af Amer: >60 mL/min (ref >60)
Glucose, Bld: 88 mg/dL (ref 70–99)
Phosphorus: 1.6 mg/dL — ABNORMAL LOW (ref 2.5–4.6)
Potassium: 3 mmol/L — ABNORMAL LOW (ref 3.5–5.1)
Sodium: 142 mmol/L (ref 135–145)

## 2018-02-03 LAB — TROPONIN I
Troponin I: 0.09 ng/mL (ref <0.03)
Troponin I: 0.23 ng/mL (ref <0.03)

## 2018-02-03 LAB — MAGNESIUM: Magnesium: 2 mg/dL (ref 1.7–2.4)

## 2018-02-03 LAB — GLUCOSE, CAPILLARY: Glucose-Capillary: 88 mg/dL (ref 70–99)

## 2018-02-03 LAB — LACTIC ACID, PLASMA: Lactic Acid, Venous: 1.9 mmol/L (ref 0.5–1.9)

## 2018-02-03 MED ORDER — ASPIRIN EC 81 MG PO TBEC
81.0000 mg | DELAYED_RELEASE_TABLET | Freq: Every day | ORAL | Status: DC
  Administered 2018-02-03 – 2018-02-07 (×5): 81 mg via ORAL
  Filled 2018-02-03 (×4): qty 1

## 2018-02-03 MED ORDER — SODIUM CHLORIDE 0.9 % IV BOLUS
500.0000 mL | Freq: Once | INTRAVENOUS | Status: AC
  Administered 2018-02-03: 500 mL via INTRAVENOUS

## 2018-02-03 MED ORDER — SODIUM CHLORIDE 0.9 % IV BOLUS
1000.0000 mL | Freq: Once | INTRAVENOUS | Status: AC
  Administered 2018-02-03: 1000 mL via INTRAVENOUS

## 2018-02-03 MED ORDER — MAGNESIUM SULFATE 2 GM/50ML IV SOLN
2.0000 g | Freq: Once | INTRAVENOUS | Status: AC
  Administered 2018-02-03: 2 g via INTRAVENOUS
  Filled 2018-02-03: qty 50

## 2018-02-03 MED ORDER — POTASSIUM CHLORIDE 10 MEQ/100ML IV SOLN
10.0000 meq | INTRAVENOUS | Status: AC
  Administered 2018-02-03 (×2): 10 meq via INTRAVENOUS
  Filled 2018-02-03 (×2): qty 100

## 2018-02-03 MED ORDER — AMIODARONE LOAD VIA INFUSION
150.0000 mg | Freq: Once | INTRAVENOUS | Status: DC
  Filled 2018-02-03: qty 83.34

## 2018-02-03 MED ORDER — AMIODARONE HCL 200 MG PO TABS
400.0000 mg | ORAL_TABLET | Freq: Two times a day (BID) | ORAL | Status: DC
  Administered 2018-02-03 – 2018-02-10 (×15): 400 mg via ORAL
  Filled 2018-02-03 (×15): qty 2

## 2018-02-03 MED ORDER — AMIODARONE HCL IN DEXTROSE 360-4.14 MG/200ML-% IV SOLN: 30.0000 mg/h | INTRAVENOUS | Status: DC

## 2018-02-03 MED ORDER — SODIUM CHLORIDE 0.9% FLUSH: 10.0000 mL | INTRAVENOUS | Status: DC | PRN

## 2018-02-03 MED ORDER — CHLORHEXIDINE GLUCONATE CLOTH 2 % EX PADS
6.0000 | MEDICATED_PAD | Freq: Every day | CUTANEOUS | Status: DC
  Administered 2018-02-04 – 2018-02-11 (×8): 6 via TOPICAL

## 2018-02-03 MED ORDER — POTASSIUM CHLORIDE CRYS ER 20 MEQ PO TBCR: 40.0000 meq | EXTENDED_RELEASE_TABLET | Freq: Once | ORAL | Status: DC

## 2018-02-03 MED ORDER — METOPROLOL TARTRATE 5 MG/5ML IV SOLN
5.0000 mg | INTRAVENOUS | Status: DC | PRN
  Administered 2018-02-03: 2.5 mg via INTRAVENOUS
  Filled 2018-02-03: qty 5

## 2018-02-03 MED ORDER — SODIUM PHOSPHATES 45 MMOLE/15ML IV SOLN
20.0000 mmol | Freq: Once | INTRAVENOUS | Status: AC
  Administered 2018-02-03: 20 mmol via INTRAVENOUS
  Filled 2018-02-03 (×2): qty 6.67

## 2018-02-03 MED ORDER — POTASSIUM CHLORIDE CRYS ER 20 MEQ PO TBCR: 40.0000 meq | EXTENDED_RELEASE_TABLET | Freq: Three times a day (TID) | ORAL | Status: DC

## 2018-02-03 MED ORDER — AMIODARONE HCL IN DEXTROSE 360-4.14 MG/200ML-% IV SOLN
60.0000 mg/h | INTRAVENOUS | Status: DC
  Filled 2018-02-03: qty 200

## 2018-02-03 MED ORDER — SODIUM CHLORIDE 0.9% FLUSH
10.0000 mL | Freq: Two times a day (BID) | INTRAVENOUS | Status: DC
  Administered 2018-02-03 – 2018-02-11 (×15): 10 mL

## 2018-02-03 MED ORDER — HEPARIN SODIUM (PORCINE) 5000 UNIT/ML IJ SOLN
5000.0000 [IU] | Freq: Three times a day (TID) | INTRAMUSCULAR | Status: DC
  Administered 2018-02-03 – 2018-02-05 (×6): 5000 [IU] via SUBCUTANEOUS
  Filled 2018-02-03 (×6): qty 1

## 2018-02-03 MED ORDER — HEPARIN (PORCINE) 25000 UT/250ML-% IV SOLN: 950.0000 [IU]/h | INTRAVENOUS | Status: DC

## 2018-02-03 MED ORDER — POTASSIUM CHLORIDE CRYS ER 20 MEQ PO TBCR
40.0000 meq | EXTENDED_RELEASE_TABLET | Freq: Three times a day (TID) | ORAL | Status: AC
  Administered 2018-02-03 (×2): 40 meq via ORAL
  Filled 2018-02-03 (×3): qty 2

## 2018-02-03 NOTE — Progress Notes (Signed)
Patients Left peripheral IV infiltrated.  Pt. Currently without access.  IV team consulted and unable to obtain access.  Dr. Sunnie Nielsenegalado updated on patients loss of access RN received an order for central line.

## 2018-02-03 NOTE — Progress Notes (Signed)
RN arrived on unit and protected foam placed at this time the patient appears to have two stage II pressure ulcers on her sacrum as well as a possible deep tissue pressure injury.  Protected foam placed and patient offloaded of her sacrum.  RN will continue to follow up.

## 2018-02-03 NOTE — Progress Notes (Signed)
  Echocardiogram 2D Echocardiogram has been performed.  Janalyn HarderWest, Shantrice Rodenberg R 02/03/2018, 4:28 PM

## 2018-02-03 NOTE — Progress Notes (Signed)
Pharmacy Antibiotic Note  Miki KinsBarbara J Bernier is a 77 y.o. female admitted on 01/27/2018 with possible aspiration  pneumonia.  Pharmacy has been consulted for Zosyn dosing.  Today, 02/03/2018:  D8 Zosyn  Afebrile  WBC spiked yesterday but improved today; still above WNL  Azotemia resolved  No new CXR since 12/27 but oxygenation appears good  Plan:  Continue Zosyn 3.375 g IV q8 hr  Pharmacy will sign off; following peripherally for culture results or changes in renal function   Height: 5\' 2"  (157.5 cm) Weight: 167 lb 1.7 oz (75.8 kg) IBW/kg (Calculated) : 50.1  Temp (24hrs), Avg:98.5 F (36.9 C), Min:97.7 F (36.5 C), Max:99.3 F (37.4 C)  Recent Labs  Lab 01/29/18 0408 01/30/18 0431 01/31/18 0548 01/31/18 1715 02/01/18 0534 02/02/18 0432 02/03/18 0528 02/03/18 0529  WBC 13.4* 13.3* 11.8*  --   --  19.8* 13.6*  --   CREATININE 1.42*  1.39* 1.15* 1.02* 0.86 0.87 0.83  --  0.84    Estimated Creatinine Clearance: 51.8 mL/min (by C-G formula based on SCr of 0.84 mg/dL).    Allergies  Allergen Reactions  . Sulfonamide Derivatives Other (See Comments)    Unknown. Pt doesn't remember.    Antimicrobials this admission:  12/24 Flagyl x 1 12/24 Zosyn >>  Dose adjustments this admission:  12/25 Zosyn increased to 3.375 grams IV q8h (4-hour infusion) given improving renal function  Microbiology results:  12/24 UCx: NGF  Thank you for allowing pharmacy to be a part of this patient's care.  Bernadene Personrew Jorje Vanatta, PharmD, BCPS [phone] 02/03/2018, 10:20 AM

## 2018-02-03 NOTE — Progress Notes (Signed)
PT Cancellation Note  Patient Details Name: [name] KinsBarbara J [name] MRN: [id number] DOB: 04/18/1940   Cancelled Treatment:    Reason Eval/Treat Not Completed: Medical issues which prohibited therapy Pt transferred to ICU this morning and not yet stable for PT evaluation.   [name],[name] 02/03/2018, 9:45 AM [name], PT, DPT Acute Rehabilitation Services Office: [phone] Pager: [phone]

## 2018-02-03 NOTE — Progress Notes (Signed)
CRITICAL VALUE ALERT  Critical Value:  Troponin 0.09  Date & Time Notied:  02/03/18 1515  Provider Notified: Dr. Sunnie Nielsenegalado   Orders Received/Actions taken: RN directed to notify Cardiology.  [place] NP updated on patients condition, received no new orders at this time.

## 2018-02-03 NOTE — Progress Notes (Signed)
Subjective: The patient was seen and examined at bedside. Had a large loose bowel movement a few minutes ago. Was transferred to ICU with A. Fib and RVR with spontaneous conversion to normal sinus rhythm.  Objective: Vital signs in last 24 hours: Temp:  [97.6 F (36.4 C)-98.6 F (37 C)] 97.6 F (36.4 C) (12/31 1200) Pulse Rate:  [57-140] 57 (12/31 1400) Resp:  [16-21] 19 (12/31 1400) BP: (76-105)/(42-77) 98/42 (12/31 1400) SpO2:  [97 %-100 %] 97 % (12/31 1400) Weight:  [75.3 kg-75.8 kg] 75.8 kg (12/31 0805) Weight change: -1.358 kg Last BM Date: 02/03/18  PE:not in distress GENERAL:not using accessory muscles of respiration, left eye deformity ABDOMEN:soft, nondistended, sluggish but active bowel sounds EXTREMITIES:no deformity  Lab Results: Results for orders placed or performed during the hospital encounter of 01/27/18 (from the past 48 hour(s))  Renal function panel     Status: Abnormal   Collection Time: 02/02/18  4:32 AM  Result Value Ref Range   Sodium 142 135 - 145 mmol/L   Potassium 2.5 (LL) 3.5 - 5.1 mmol/L    Comment: CRITICAL RESULT CALLED TO, READ BACK BY AND VERIFIED WITH: C,ELLIS AT 0555 ON 02/02/18 BY A,MOHAMED DELTA CHECK NOTED    Chloride 114 (H) 98 - 111 mmol/L   CO2 21 (L) 22 - 32 mmol/L   Glucose, Bld 126 (H) 70 - 99 mg/dL   BUN 23 8 - 23 mg/dL   Creatinine, Ser 1.61 0.44 - 1.00 mg/dL   Calcium 7.7 (L) 8.9 - 10.3 mg/dL   Phosphorus 2.0 (L) 2.5 - 4.6 mg/dL   Albumin 2.4 (L) 3.5 - 5.0 g/dL   GFR calc non Af Amer >60 >60 mL/min   GFR calc Af Amer >60 >60 mL/min   Anion gap 7 5 - 15    Comment: Performed at [facility], [address] [place], [place] [address]  CBC with Differential/Platelet     Status: Abnormal   Collection Time: 02/02/18  4:32 AM  Result Value Ref Range   WBC 19.8 (H) 4.0 - 10.5 K/uL   RBC 4.43 3.87 - 5.11 MIL/uL   Hemoglobin 11.3 (L) 12.0 - 15.0 g/dL   HCT 54.0 (L) 98.1 - 19.1 %   MCV 80.4 80.0 - 100.0 fL    MCH 25.5 (L) 26.0 - 34.0 pg   MCHC 31.7 30.0 - 36.0 g/dL   RDW 47.8 (H) 29.5 - 62.1 %   Platelets 199 150 - 400 K/uL   nRBC 0.0 0.0 - 0.2 %   Neutrophils Relative % 80 %   Neutro Abs 15.7 (H) 1.7 - 7.7 K/uL   Lymphocytes Relative 9 %   Lymphs Abs 1.8 0.7 - 4.0 K/uL   Monocytes Relative 7 %   Monocytes Absolute 1.4 (H) 0.1 - 1.0 K/uL   Eosinophils Relative 2 %   Eosinophils Absolute 0.5 0.0 - 0.5 K/uL   Basophils Relative 0 %   Basophils Absolute 0.0 0.0 - 0.1 K/uL   Immature Granulocytes 2 %   Abs Immature Granulocytes 0.37 (H) 0.00 - 0.07 K/uL    Comment: Performed at [facility], [address] [place], [place] [address]  Magnesium     Status: None   Collection Time: 02/02/18  4:32 AM  Result Value Ref Range   Magnesium 2.3 1.7 - 2.4 mg/dL    Comment: Performed at [facility], [address] [place], [place] [address]  Glucose, capillary     Status:  Abnormal   Collection Time: 02/02/18  7:35 AM  Result Value Ref Range   Glucose-Capillary 109 (H) 70 - 99 mg/dL  Procalcitonin - Baseline     Status: None   Collection Time: 02/02/18 12:27 PM  Result Value Ref Range   Procalcitonin 2.24 ng/mL    Comment:        Interpretation: PCT > 2 ng/mL: Systemic infection (sepsis) is likely, unless other causes are known. (NOTE)       Sepsis PCT Algorithm           Lower Respiratory Tract                                      Infection PCT Algorithm    ----------------------------     ----------------------------         PCT < 0.25 ng/mL                PCT < 0.10 ng/mL         Strongly encourage             Strongly discourage   discontinuation of antibiotics    initiation of antibiotics    ----------------------------     -----------------------------       PCT 0.25 - 0.50 ng/mL            PCT 0.10 - 0.25 ng/mL               OR       >80% decrease in PCT            Discourage initiation of                                             antibiotics      Encourage discontinuation           of antibiotics    ----------------------------     -----------------------------         PCT >= 0.50 ng/mL              PCT 0.26 - 0.50 ng/mL               AND       <80% decrease in PCT              Encourage initiation of                                             antibiotics       Encourage continuation           of antibiotics    ----------------------------     -----------------------------        PCT >= 0.50 ng/mL                  PCT > 0.50 ng/mL               AND         increase in PCT                  Strongly encourage  initiation of antibiotics    Strongly encourage escalation           of antibiotics                                     -----------------------------                                           PCT <= 0.25 ng/mL                                                 OR                                        > 80% decrease in PCT                                     Discontinue / Do not initiate                                             antibiotics Performed at [facility], [address], [place], [place] [address]   Potassium     Status: None   Collection Time: 02/02/18  7:04 PM  Result Value Ref Range   Potassium 3.5 3.5 - 5.1 mmol/L    Comment: DELTA CHECK NOTED REPEATED TO VERIFY NO VISIBLE HEMOLYSIS Performed at [facility], [address] [place], [place] [address]   CBC with Differential/Platelet     Status: Abnormal   Collection Time: 02/03/18  5:28 AM  Result Value Ref Range   WBC 13.6 (H) 4.0 - 10.5 K/uL   RBC 3.75 (L) 3.87 - 5.11 MIL/uL   Hemoglobin 9.6 (L) 12.0 - 15.0 g/dL   HCT 86.5 (L) 78.4 - 69.6 %   MCV 81.3 80.0 - 100.0 fL   MCH 25.6 (L) 26.0 - 34.0 pg   MCHC 31.5 30.0 - 36.0 g/dL   RDW 29.5 (H) 28.4 - 13.2 %   Platelets 183 150 - 400 K/uL   nRBC 0.0 0.0 - 0.2 %   Neutrophils Relative % 65 %   Neutro  Abs 8.8 (H) 1.7 - 7.7 K/uL   Lymphocytes Relative 20 %   Lymphs Abs 2.7 0.7 - 4.0 K/uL   Monocytes Relative 8 %   Monocytes Absolute 1.0 0.1 - 1.0 K/uL   Eosinophils Relative 5 %   Eosinophils Absolute 0.7 (H) 0.0 - 0.5 K/uL   Basophils Relative 0 %   Basophils Absolute 0.0 0.0 - 0.1 K/uL   Immature Granulocytes 2 %   Abs Immature Granulocytes 0.24 (H) 0.00 - 0.07 K/uL    Comment: Performed at [facility], [address], [place], [place] [address]  Magnesium     Status: None   Collection Time: 02/03/18  5:28 AM  Result Value Ref Range   Magnesium 2.0  1.7 - 2.4 mg/dL    Comment: Performed at [facility], [address] [place], [place] [id number]  Renal function panel     Status: Abnormal   Collection Time: 02/03/18  5:29 AM  Result Value Ref Range   Sodium 142 135 - 145 mmol/L   Potassium 3.0 (L) 3.5 - 5.1 mmol/L   Chloride 116 (H) 98 - 111 mmol/L   CO2 20 (L) 22 - 32 mmol/L   Glucose, Bld 88 70 - 99 mg/dL   BUN 28 (H) 8 - 23 mg/dL   Creatinine, Ser 1.910.84 0.44 - 1.00 mg/dL   Calcium 7.4 (L) 8.9 - 10.3 mg/dL   Phosphorus 1.6 (L) 2.5 - 4.6 mg/dL   Albumin 2.2 (L) 3.5 - 5.0 g/dL   GFR calc non Af Amer >60 >60 mL/min   GFR calc Af Amer >60 >60 mL/min   Anion gap 6 5 - 15    Comment: Performed at [facility], [address] [place], [place] [address]  Glucose, capillary     Status: None   Collection Time: 02/03/18  8:11 AM  Result Value Ref Range   Glucose-Capillary 88 70 - 99 mg/dL    Studies/Results: Dg Abd 1 View  Result Date: 02/02/2018 CLINICAL DATA:  Abdominal distention. EXAM: ABDOMEN - 1 VIEW COMPARISON:  02/01/2018, 01/30/2018.  CT 01/27/2018. FINDINGS: Persistent bowel distention particularly of the sigmoid is again noted. Similar findings noted on prior exam. No free air. Calcific density left upper quadrant unchanged. Degenerative changes scoliosis thoracic and lumbar spine again noted. IMPRESSION:  Persistent bowel distention particularly of the sigmoid colon. No significant change from prior exam. Electronically Signed   By: Maisie Fushomas  Register   On: 02/02/2018 09:57   Dg Chest Port 1 View  Result Date: 02/03/2018 CLINICAL DATA:  Central line placement EXAM: PORTABLE CHEST 1 VIEW COMPARISON:  01/30/2018 FINDINGS: Right central line is been placed with the tip in the upper SVC. No pneumothorax. Low lung volumes. Patchy right lung airspace opacities, improved since prior study. No focal opacity on the left. No effusions or acute bony abnormality. IMPRESSION: Right central line placement with the tip in the upper right atrium. No pneumothorax. Low lung volumes with patchy right lung airspace disease. Improved aeration on the left. Electronically Signed   By: Charlett NoseKevin  Dover M.D.   On: 02/03/2018 12:30   Dg Abd Portable 1v  Result Date: 02/03/2018 CLINICAL DATA:  Tachycardia EXAM: PORTABLE ABDOMEN - 1 VIEW COMPARISON:  02/02/2018 01/28/2018; CT abdomen pelvis-01/27/2018 FINDINGS: Similar-appearing marked distension of the colon without significant interval change. No supine evidence of pneumoperitoneum. No pneumatosis or portal venous gas Peripherally calcified splenic mass overlies the left upper abdominal quadrant. Moderate severe multilevel lumbar spine. No acute osseous abnormalities. Multiple phleboliths overlie the lower pelvis bilaterally. IMPRESSION: Marked gaseous distention of the colon without interval change. No definite pneumoperitoneum, pneumatosis or portal venous gas. Electronically Signed   By: Simonne ComeJohn  Watts M.D.   On: 02/03/2018 09:15    Medications: I have reviewed the patient's current medications.  Assessment: Constipation and colonic ileus Marked gaseous distention of the colon without any change on abdominal x-ray from today without definitive pneumoperitoneum, pneumatosis or portal venous gas  Plan: Continue clear liquid diet The patient has started colonic prep but finished a  less than one fourth of the gallon,encouraged to finish the same, no time limit. Recommend follow-up clinically and with abdominal x-ray every 1-2 days. Encourage OOB to chair and early  ambulation if possible and keep potassium around 4.5    Kerin Salen 02/03/2018, 2:35 PM   Pager [phone] If no answer or after 5 PM call [phone]

## 2018-02-03 NOTE — Consult Note (Signed)
Cardiology Consultation:   Patient ID: [name] MRN: [id number]; DOB: 07/01/1940  Admit date: 01/27/2018 Date of Consult: 02/03/2018  Primary Care Provider: Nadara [name], [name] J, MD Primary Cardiologist: [name] RedBridgette Christopher, MD new Primary Electrophysiologist:  Hillis RangeJames Allred, MD remote   Patient Profile:   [name] is a 77 y.o. 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 Sunnie [name].  History of Present Illness:   Ms. [name] has remotely seen Dr. Johney FrameAllred in 2015 for PAF.  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  EF 55-65%.   Echo 2013 with EF 60-65%  Lt atrium was mildly dilated.  Chronic diastolic HF.   Other hx of CVA, PAF with issues with GI bleed on anticoagulation.   She had stopped anticoagulation in 2015 for fear of GI bleed.  Since then has been diagnosed with Parkinson's disease, and has advanced dementia.  Lives in SNF.  Poor po intake.    Now admitted with abd distention, N, V.  CT of abd with large stool burden, + rectal impaction.    EKG:  The EKG was personally reviewed and demonstrates:  Initial EKG not seen. Today's EKG a fib with RVR and ST depression possible post MI but no chest pain.  She was in SR in 04/2017. Telemetry:  Telemetry was personally reviewed and demonstrates:  She had been in SR but now A Fib.    K+ 3.0, Na 142, Cr 0.84, phos has been < 1.0 to 1.6, magnesium 2.0,  procalcitonin 2.24  Hgb 9.6 plts 183 and WBC 13.6 down from 19.8   CXR on admit Extremely low lung volumes. Bibasilar atelectasis. No clear acute findings CXR 01/30/18:  Improved aeration. Persistent basilar opacities may reflect atelectasis or pneumonia. 2. Stable mild cardiomegaly.  abd film 12.29/19  Read demonstrated  marked gaseous distention of multiple loops of large and small bowel throughout the abdomen. Supine evaluation limited for the detection of free intraperitoneal air. Lumbar spine degenerative changes. Unchanged calcified mass left upper quadrant.  IMPRESSION: Read demonstrated gaseous distended loops of large and small bowel throughout the abdomen.  Currently resting comfortably almost flat in bed.  No chest pain and no SOB.  She is not sure where she is but feels ok.    troponin pending, replacing K+ ,   HR is still elevated and systolic BP is lower.  Most likely will need amiodarone.  Not on anticoagulation and hgb is down to 9.6 from 11.3 on subcu heparin.     Past Medical History:  Diagnosis Date  . Anxiety disorder   . Atrial fibrillation (HCC)    a. new 04/2011;  b. Pradaxa  . Chronic diastolic heart failure (HCC)    Echocardiogram 04/14/11: Moderate LVH, EF 60-65%, mild LAE.  Marland Kitchen. 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 Gastritis   . H/O: hysterectomy   . Heart murmur    from age 77 associated with some hemoptysis  . Hypercholesteremia   . Hypertension   . Impaired glucose tolerance   . Obesity   . Osteoarthritis   . Parkinson's disease (HCC)    patient denies having parkinsons  . Stroke [facility](HCC) 2007  Residual R sided weakness, uses a wheelchair, walker at home    Past Surgical History:  Procedure Laterality Date  . CARPAL TUNNEL RELEASE     right  . 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 LEFT HEART CATHETERIZATION WITH CORONARY ANGIOGRAM N/A 05/03/2011   Procedure: LEFT HEART CATHETERIZATION WITH CORONARY ANGIOGRAM;  Surgeon: [name] Abraham, MD;  Location: [facility] CATH LAB;  Service: Cardiovascular;  Laterality: N/A;  . REPLACEMENT TOTAL KNEE     right  . TONSILLECTOMY       Home Medications:  Prior to Admission medications   Medication Sig Start Date End Date  Taking? Authorizing Provider  amLODipine-benazepril (LOTREL) 5-20 MG per capsule Take 1 capsule by mouth daily.   Yes [provider]  aspirin EC 81 MG tablet Take 1 tablet (81 mg total) by mouth daily. 09/04/13  Yes [name], [name] Lean, MD  Cyanocobalamin (VITAMIN B 12 PO) Take 1 tablet by mouth daily.   Yes [provider]  Hydrocortisone (GERHARDT'S BUTT CREAM) CREA Apply 1 application topically daily as needed for irritation.   Yes [provider]  metoprolol (LOPRESSOR) 50 MG tablet Take 50 mg by mouth 2 (two) times daily.   Yes [provider]  MYRBETRIQ 25 MG TB24 tablet  02/17/15  Yes [provider]  polyethylene glycol (MIRALAX) packet Take 17 g by mouth daily. 09/02/13  Yes [name], [name] Masker, MD  Vitamin D, Ergocalciferol, (DRISDOL) 1.25 MG (50000 UT) CAPS capsule Take 50,000 Units by mouth every 30 (thirty) days. 15th   Yes [provider]  cefTRIAXone (ROCEPHIN) 1 g injection  02/18/15   [provider]  QUEtiapine (SEROQUEL) 25 MG tablet 1-2 tabs qhs Patient not taking: Reported on 01/27/2018 04/01/14   [name] Feinstein, MD    Inpatient Medications: Scheduled Meds: . aspirin EC  81 mg Oral Daily  . heparin  5,000 Units Subcutaneous Q8H  . metoprolol tartrate  50 mg Oral BID  . potassium chloride  40 mEq Oral TID  . [START ON 02/18/2018] Vitamin D (Ergocalciferol)  50,000 Units Oral Q30 days   Continuous Infusions: . 0.45 % NaCl with KCl 20 mEq / L 50 mL/hr at 02/03/18 0637  . magnesium sulfate 1 - 4 g bolus IVPB    . piperacillin-tazobactam (ZOSYN)  IV 3.375 g (02/03/18 0513)  . potassium chloride    . sodium chloride    . sodium phosphate  Dextrose 5% IVPB     PRN Meds: acetaminophen **OR** acetaminophen, Gerhardt's butt cream, levalbuterol, metoprolol tartrate, ondansetron **OR** ondansetron (ZOFRAN) IV  Allergies:    Allergies  Allergen Reactions  . Sulfonamide Derivatives Other (See Comments)    Unknown. Pt  doesn't remember.    Social History:   Social History   Socioeconomic History  . Marital status: Widowed    Spouse name: Not on file  . Number of children: 2  . Years of education: college  . Highest education level: Not on file  Occupational History  . Occupation: retired  Engineer, production  . Financial resource strain: Not on file  . Food insecurity:    Worry: Not on file    Inability: Not on file  . Transportation needs:    Medical: Not on file    Non-medical: Not on file  Tobacco Use  . Smoking status: Never Smoker  . Smokeless tobacco: Never Used  Substance and Sexual Activity  . Alcohol use: No  .  Drug use: No  . Sexual activity: Not Currently  Lifestyle  . Physical activity:    Days per week: Not on file    Minutes per session: Not on file  . Stress: Not on file  Relationships  . Social connections:    Talks on phone: Not on file    Gets together: Not on file    Attends religious service: Not on file    Active member of club or organization: Not on file    Attends meetings of clubs or organizations: Not on file    Relationship status: Not on file  . Intimate partner violence:    Fear of current or ex partner: Not on file    Emotionally abused: Not on file    Physically abused: Not on file    Forced sexual activity: Not on file  Other Topics Concern  . Not on file  Social History Narrative   Lives in [place] with son.  Patient is in assisted living right now [facility].  Widow.  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    Family History:    Family History  Problem Relation Age of Onset  . Cancer Mother 11       died  . Heart attack Father 19       died     ROS:  Please see the history of present illness.  General:no colds or fevers, + weight loss, decreased appetite Skin:no rashes or ulcers HEENT:no blurred vision, no congestion CV:see HPI PUL:see HPI GI:no diarrhea +  constipation no melena, no indigestion, + N&V, abd distention GU:no hematuria, no dysuria MS:no joint pain, no claudication Neuro:no syncope, no lightheadedness, + dementia so difficult to have factual answers Endo:no diabetes, no thyroid disease  All other ROS reviewed and negative.     Physical Exam/Data:   Vitals:   02/02/18 1328 02/02/18 2123 02/03/18 0532 02/03/18 0711  BP: (!) 97/57 (!) 96/55 (!) 105/58 (!) 76/51  Pulse: 75 82 86 (!) 115  Resp: 20 18    Temp: 99.3 F (37.4 C) 98.5 F (36.9 C) 98.2 F (36.8 C) 98.6 F (37 C)  TempSrc: Oral Oral Axillary Oral  SpO2: 100% 100% 100% 98%  Weight:   75.3 kg   Height:        Intake/Output Summary (Last 24 hours) at 02/03/2018 0757 Last data filed at 02/03/2018 0600 Gross per 24 hour  Intake 1240.91 ml  Output 8 ml  Net 1232.91 ml   Filed Weights   02/01/18 0502 02/02/18 0611 02/03/18 0532  Weight: 77.1 kg 76.7 kg 75.3 kg   Body mass index is 30.36 kg/m.  General:  Well nourished, well developed, in no acute distress, lying almost flat without SOB, unaware her heart is racing.   HEENT: normal Lymph: no adenopathy Neck: no JVD Endocrine:  No thryomegaly Vascular: No carotid bruits; pedal pulses 1+ bilaterally Cardiac:  Rapid heart sounds and irreg irreg at times when slower ; no murmur rub or click.   Lungs:  clear to auscultation bilaterally, no wheezing, rhonchi or rales  Abd: soft, nontender, no hepatomegaly  Ext: no pitting edema of lower ext.  But poor muscle strength.  Musculoskeletal:  Some foot drop,  Skin: warm and dry  Neuro:  Oriented to building but she does not know where she is, believes it to be Jan, but no year.  Answers when name called, no  focal abnormalities noted Psych:  Normal affect   Relevant CV Studies: Echo 2013  Study Conclusions  - Left ventricle: The cavity size was normal. Wall thickness was increased in a pattern of moderate LVH. Systolic function was normal. The estimated  ejection fraction was in the range of 60% to 65%. Regional wall motion abnormalities cannot be excluded. - Left atrium: The atrium was mildly dilated.  Laboratory Data:  Chemistry Recent Labs  Lab 02/01/18 0534 02/02/18 0432 02/02/18 1904 02/03/18 0529  NA 141 142  --  142  K 3.7 2.5* 3.5 3.0*  CL 115* 114*  --  116*  CO2 20* 21*  --  20*  GLUCOSE 116* 126*  --  88  BUN 11 23  --  28*  CREATININE 0.87 0.83  --  0.84  CALCIUM 7.8* 7.7*  --  7.4*  GFRNONAA >60 >60  --  >60  GFRAA >60 >60  --  >60  ANIONGAP 6 7  --  6    Recent Labs  Lab 01/27/18 1110  02/01/18 0534 02/02/18 0432 02/03/18 0529  PROT 7.7  --   --   --   --   ALBUMIN 4.4   < > 2.6* 2.4* 2.2*  AST 28  --   --   --   --   ALT 23  --   --   --   --   ALKPHOS 87  --   --   --   --   BILITOT 1.4*  --   --   --   --    < > = values in this interval not displayed.   Hematology Recent Labs  Lab 01/31/18 0548 02/02/18 0432 02/03/18 0528  WBC 11.8* 19.8* 13.6*  RBC 4.78 4.43 3.75*  HGB 12.2 11.3* 9.6*  HCT 38.1 35.6* 30.5*  MCV 79.7* 80.4 81.3  MCH 25.5* 25.5* 25.6*  MCHC 32.0 31.7 31.5  RDW 15.7* 15.8* 16.0*  PLT 174 199 183   Cardiac EnzymesNo results for input(s): TROPONINI in the last 168 hours. No results for input(s): TROPIPOC in the last 168 hours.  BNPNo results for input(s): BNP, PROBNP in the last 168 hours.  DDimer No results for input(s): DDIMER in the last 168 hours.  Radiology/Studies:  Dg Abd 1 View  Result Date: 02/02/2018 CLINICAL DATA:  Abdominal distention. EXAM: ABDOMEN - 1 VIEW COMPARISON:  02/01/2018, 01/30/2018.  CT 01/27/2018. FINDINGS: Persistent bowel distention particularly of the sigmoid is again noted. Similar findings noted on prior exam. No free air. Calcific density left upper quadrant unchanged. Degenerative changes scoliosis thoracic and lumbar spine again noted. IMPRESSION: Persistent bowel distention particularly of the sigmoid colon. No significant change from  prior exam. Electronically Signed   By: Maisie Fus  Register   On: 02/02/2018 09:57   Dg Abd 1 View  Result Date: 02/01/2018 CLINICAL DATA:  Abdominal distension. EXAM: ABDOMEN - 1 VIEW COMPARISON:  Abdominal radiograph 01/30/2018 FINDINGS: Read demonstrated marked gaseous distention of multiple loops of large and small bowel throughout the abdomen. Supine evaluation limited for the detection of free intraperitoneal air. Lumbar spine degenerative changes. Unchanged calcified mass left upper quadrant. IMPRESSION: Read demonstrated gaseous distended loops of large and small bowel throughout the abdomen. Electronically Signed   By: Annia Belt M.D.   On: 02/01/2018 12:06   Dg Abd 1 View  Result Date: 01/30/2018 CLINICAL DATA:  ABDOMINAL DISTENTION EXAM: ABDOMEN - 1 VIEW COMPARISON:  KUB OF 01/29/2018 AND 01/28/2017 FINDINGS: There is  little change in the gaseous distention of both large and small bowel. As noted previously this may be related to fecal impaction of the rectum or possibly a distal colonic lesion. 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 There are degenerative changes diffusely throughout the thoracolumbar spine. IMPRESSION: Little change in gaseous distention of large and small bowel. Electronically Signed   By: Dwyane DeePaul  Barry M.D.   On: 01/30/2018 11:59   Dg Chest Port 1 View  Result Date: 01/30/2018 CLINICAL DATA:  History of atrial fibrillation, CHF, dementia EXAM: PORTABLE CHEST 1 VIEW COMPARISON:  Portable chest x-ray of 01/27/2018 FINDINGS: Aeration of the lungs has improved somewhat. There are still opacities at both lung bases which may represent atelectasis or pneumonia. No definite effusion is seen. The heart is mildly enlarged and stable. IMPRESSION: 1. Improved aeration. Persistent basilar opacities may reflect atelectasis or pneumonia. 2. Stable mild cardiomegaly. Electronically Signed   By: Dwyane DeePaul  Barry M.D.   On: 01/30/2018 12:02     Assessment and Plan:   1. A fib with RVR discussed with Dr. Cristal Deerhristopher  CHA2DS2VASc of at least 7.  She has had drop in hgb will add IV heparin for now with new a fib but may need to stop will follow h/h closely, check stool for hemoccult,  With decrease in BP will give IV amiodarone for rate control. 2. Abnormal EKG with known CAD by cath 2013.  troponins pending, will check Echo.  All may be due to electrolyte imbalance and bowel issues.  Will check Echo.  3. Parkinson's disease with dementia and now decreased appetite, may be end stage.  She is a DNR.  4.  Hx of GI bleed on anticoagulation.  And only on ASA as outpt.  In past she has not wanted anticoagulation despite high CHA2DS2VASc score of 7. See above note.       For questions or updates, please contact [facility] Please consult [email or web address] for contact info under     Signed, Nada BoozerLaura Vashawn Ekstein, NP  02/03/2018 7:57 AM

## 2018-02-03 NOTE — Progress Notes (Signed)
Pt converted to SR just as PICC line cleared for use. Will not give IV heparin, continue subcu only, and no IV amiodarone will begin amiodarone 400 mg BID for 1 week then 200 mg daily.   Discussed with Dr. Cristal Deerhristopher.

## 2018-02-03 NOTE — Progress Notes (Addendum)
ANTICOAGULATION CONSULT NOTE  Pharmacy Consult for IV heparin Indication: Afib  Allergies  Allergen Reactions  . Sulfonamide Derivatives Other (See Comments)    Unknown. Pt doesn't remember.    Patient Measurements: Height: 5\' 2"  (157.5 cm) Weight: 167 lb 1.7 oz (75.8 kg) IBW/kg (Calculated) : 50.1 Heparin Dosing Weight: 67 kg  Vital Signs: Temp: 97.7 F (36.5 C) (12/31 0805) Temp Source: Oral (12/31 0805) BP: 91/50 (12/31 0805) Pulse Rate: 140 (12/31 0750)  Labs: Recent Labs    02/01/18 0534 02/02/18 0432 02/03/18 0528 02/03/18 0529  HGB  --  11.3* 9.6*  --   HCT  --  35.6* 30.5*  --   PLT  --  199 183  --   CREATININE 0.87 0.83  --  0.84    Estimated Creatinine Clearance: 51.8 mL/min (by C-G formula based on SCr of 0.84 mg/dL).   Medical History: Past Medical History:  Diagnosis Date  . Anxiety disorder   . Atrial fibrillation (HCC)    a. new 04/2011;  b. Pradaxa  . Chronic diastolic heart failure (HCC)    Echocardiogram 04/14/11: Moderate LVH, EF 60-65%, mild LAE.  Marland Kitchen. 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 Gastritis   . H/O: hysterectomy   . Heart murmur    from age 77 associated with some hemoptysis  . Hypercholesteremia   . Hypertension   . Impaired glucose tolerance   . Obesity   . Osteoarthritis   . Parkinson's disease (HCC)    patient denies having parkinsons  . Stroke [facility](HCC) 2007   Residual R sided weakness, uses a wheelchair, walker at home    Medications:  Medications Prior to Admission  Medication Sig Dispense Refill Last Dose  . amLODipine-benazepril (LOTREL) 5-20 MG per capsule Take 1 capsule by mouth daily.   01/26/2018 at Unknown time  . aspirin EC 81 MG tablet Take 1 tablet (81 mg total) by mouth daily.   01/26/2018 at Unknown time  . Cyanocobalamin (VITAMIN B 12 PO) Take 1 tablet by mouth daily.   01/26/2018 at Unknown time  . Hydrocortisone  (GERHARDT'S BUTT CREAM) CREA Apply 1 application topically daily as needed for irritation.   prn  . metoprolol (LOPRESSOR) 50 MG tablet Take 50 mg by mouth 2 (two) times daily.   01/26/2018 at 800 pm  . MYRBETRIQ 25 MG TB24 tablet    01/26/2018 at Unknown time  . polyethylene glycol (MIRALAX) packet Take 17 g by mouth daily. 14 each 0 01/26/2018 at Unknown time  . Vitamin D, Ergocalciferol, (DRISDOL) 1.25 MG (50000 UT) CAPS capsule Take 50,000 Units by mouth every 30 (thirty) days. 15th   Past Month at Unknown time  . cefTRIAXone (ROCEPHIN) 1 g injection    Not Taking at Unknown time  . QUEtiapine (SEROQUEL) 25 MG tablet 1-2 tabs qhs (Patient not taking: Reported on 01/27/2018)   Not Taking at Unknown time   Scheduled:  . amiodarone  150 mg Intravenous Once  . metoprolol tartrate  50 mg Oral BID  . potassium chloride  40 mEq Oral TID  . [START ON 02/18/2018] Vitamin D (Ergocalciferol)  50,000 Units Oral Q30 days   PRN: acetaminophen **OR** acetaminophen, Gerhardt's butt cream, levalbuterol, metoprolol tartrate, ondansetron **OR** ondansetron (ZOFRAN) IV  Assessment: [age] yoF with PMH PAF (CHADS2-VASc ~7 but not on AC d/t Hx GIB), dCHF, CAD, HLD, Parkinson's, dementia, Hx CVA admitted for abdominal distention. Noted  to have tachycardia and abnormal EKG during admission; Cardiology would like to start anticoagulation, but with unstable Hgb will begin Heparin with pharmacy to dose.   Baseline INR, aPTT: not done (on SQ hep immediately prior to IV infusion  Prior anticoagulation: ASA 81  Significant events:  Today, 02/03/2018:  CBC: Hgb dropped significantly from admission, Cards checking FOBT for occult source of bleeding; Plt WNL but lower from admission  No bleeding or infusion issues per nursing  Goal of Therapy: Heparin level 0.3-0.7 units/ml Monitor platelets by anticoagulation protocol: Yes  Plan:  Heparin 950 units/hr IV infusion, no bolus given pending occult bleeding  workup  Check heparin level 8 hrs after start  Daily CBC, daily heparin level once stable  Monitor for signs of bleeding or thrombosis   Bernadene Personrew Jahira Swiss, PharmD, BCPS [phone] 02/03/2018, 10:04 AM   ADDENDUM  After discussion with cardiologist, CCM feels bleeding risk outweighs benefits of stroke prevention in this bedbound patient with advanced dementia. Will discontinue heparin orders and resume ASA 81, SQ heparin  Bernadene Personrew Rudy Domek, PharmD, BCPS [phone] 02/03/2018, 11:44 AM

## 2018-02-03 NOTE — Progress Notes (Addendum)
PROGRESS NOTE    Miki KinsBarbara J Vroom  WUJ:[id number] DOB: 02/04/1940 DOA: 01/27/2018 PCP: Nadara EatonPiazza, Michael J, MD    Brief Narrative ; 77 year old with past medical history significant for A. fib, chronic diastolic heart failure, coronary artery disease, Parkinson disease, history of CVA with right-sided weakness who was admitted with abdominal distention, poor oral intake and constipation. Patient was found to have severe abdominal distention and CT of the abdomen pelvis showed gaseous distention of the colon the very large stool burden identified within the rectum which favored a rectal impaction. Patient was found to have an AKI likely due to her poor p.o. intake.  GI was consulted for fecal impaction and colonic distention.  GI have been helping with management, GoLYTELY was ordered.  Unclear if patient has received or able to tolerate the GoLYTELY.  Patient has received multiple.  Patient was treated for hypernatremia and AKI.  The morning of 12-31, patient developed A. fib with RVR, shortness of breath.  EKG with ST depression.  Patient was also hypotensive with blood pressure in the 70s.  She received 1 L of IV bolus.  When her blood pressure improved she received a low dose metoprolol.  Cardiology was consulted.  Amiodarone was order.   Assessment & Plan:   Active Problems:   Essential hypertension   Coronary atherosclerosis   Atrial fibrillation (HCC)   Diastolic CHF (HCC)   Ambulatory dysfunction   Parkinson disease (HCC)   Constipation   Dementia due to Parkinson's disease with behavioral disturbance (HCC)   Hypokalemia   AKI (acute kidney injury) (HCC)   Leukocytosis   Acute on chronic respiratory failure with hypoxia (HCC)   Hyperbilirubinemia   Abdominal distention   1-A. fib with RVR, Hypotension;  Patient received IV fluids.  Blood pressure increased to the 100 range.  IV metoprolol PRN was ordered. Cardiology was consulted, patient was a started on amiodarone. Patient  subsequently sinus convert on her own before initiation of amiodarone. IV bolus, increase IV fluids.  Check lactic acid.  2-Abdominal distention; in the setting of severe constipation and fecal impaction GI consulted and following Patient receive smog enema without any improvement. Per GI.  Repeated KUB this morning due to event stable distention  3-hypokalemia hypophosphatemia; Replete IV  AKI; creatinine on admission at 3.1 Improved with IV fluids.  Pneumonia, leukocytosis; Question of possible aspiration. Continue with IV Zosyn.  Acute hypoxic respiratory failure Suspect related to aspiration. Continue with oxygen supplement..  Dementia Parkinson disease Delirium precaution. On quetiapine.   Hypertension hold benazepril.             Estimated body mass index is 30.36 kg/m as calculated from the following:   Height as of this encounter: 5\' 2"  (1.575 m).   Weight as of this encounter: 75.3 kg.   DVT prophylaxis: heparin  Code Status: DNR Family Communication: no family at bedside.  Disposition Plan: remain in the step down unit.    Consultants:   Cardiology  GI   Procedures:  none  Antimicrobials Zosyn   Subjective: Alert, denies chest pain.  Mild dyspnea. Denies abdominal pain   Objective: Vitals:   02/02/18 1328 02/02/18 2123 02/03/18 0532 02/03/18 0711  BP: (!) 97/57 (!) 96/55 (!) 105/58 (!) 76/51  Pulse: 75 82 86 (!) 115  Resp: 20 18    Temp: 99.3 F (37.4 C) 98.5 F (36.9 C) 98.2 F (36.8 C) 98.6 F (37 C)  TempSrc: Oral Oral Axillary Oral  SpO2: 100% 100% 100% 98%  Weight:   75.3 kg   Height:        Intake/Output Summary (Last 24 hours) at 02/03/2018 0735 Last data filed at 02/03/2018 0600 Gross per 24 hour  Intake 1240.91 ml  Output 8 ml  Net 1232.91 ml   Filed Weights   02/01/18 0502 02/02/18 0611 02/03/18 0532  Weight: 77.1 kg 76.7 kg 75.3 kg    Examination:  General exam: Appears calm and comfortable    Respiratory system: Clear to auscultation. Respiratory effort normal. Cardiovascular system: S1 & S2 heard, RRR. No JVD, murmurs, rubs, gallops or clicks. No pedal edema. Gastrointestinal system: Abdomen is nondistended, soft and nontender. No organomegaly or masses felt. Normal bowel sounds heard. Central nervous system: Alert Extremities: Symmetric 5 x 5 power. Skin: No rashes, lesions or ulcers     Data Reviewed: I have personally reviewed following labs and imaging studies  CBC: Recent Labs  Lab 01/29/18 0408 01/30/18 0431 01/31/18 0548 02/02/18 0432 02/03/18 0528  WBC 13.4* 13.3* 11.8* 19.8* 13.6*  NEUTROABS 11.3* 11.1* 8.0* 15.7* 8.8*  HGB 14.8 13.7 12.2 11.3* 9.6*  HCT 47.8* 44.2 38.1 35.6* 30.5*  MCV 82.0 82.2 79.7* 80.4 81.3  PLT 194 201 174 199 183   Basic Metabolic Panel: Recent Labs  Lab 01/28/18 1423 01/29/18 0408 01/30/18 0431 01/31/18 0548 01/31/18 1715 02/01/18 0534 02/02/18 0432 02/02/18 1904 02/03/18 0528 02/03/18 0529  NA 148* 150*  149* 156* 148* 144 141 142  --   --  142  K 3.0* 3.4*  3.4* 2.9* 2.7* 3.4* 3.7 2.5* 3.5  --  3.0*  CL 116* 121*  121* 125* 119* 118* 115* 114*  --   --  116*  CO2 17* 18*  18* 22 21* 20* 20* 21*  --   --  20*  GLUCOSE 114* 111*  109* 133* 112* 100* 116* 126*  --   --  88  BUN 62* 43*  43* 26* 12 9 11 23   --   --  28*  CREATININE 1.95* 1.42*  1.39* 1.15* 1.02* 0.86 0.87 0.83  --   --  0.84  CALCIUM 8.9 8.7*  8.7* 8.7* 8.2* 8.1* 7.8* 7.7*  --   --  7.4*  MG 2.7* 2.7*  --  2.1  --   --  2.3  --  2.0  --   PHOS 3.5 1.7*  1.6* <1.0* <0.1*  --  <1.0* 2.0*  --   --  1.6*   GFR: Estimated Creatinine Clearance: 51.6 mL/min (by C-G formula based on SCr of 0.84 mg/dL). Liver Function Tests: Recent Labs  Lab 01/27/18 1110  01/30/18 0431 01/31/18 0548 02/01/18 0534 02/02/18 0432 02/03/18 0529  AST 28  --   --   --   --   --   --   ALT 23  --   --   --   --   --   --   ALKPHOS 87  --   --   --   --   --   --    BILITOT 1.4*  --   --   --   --   --   --   PROT 7.7  --   --   --   --   --   --   ALBUMIN 4.4   < > 3.2* 2.8* 2.6* 2.4* 2.2*   < > = values in this interval not displayed.   Recent Labs  Lab 01/27/18 1110  LIPASE 63*  No results for input(s): AMMONIA in the last 168 hours. Coagulation Profile: No results for input(s): INR, PROTIME in the last 168 hours. Cardiac Enzymes: No results for input(s): CKTOTAL, CKMB, CKMBINDEX, TROPONINI in the last 168 hours. BNP (last 3 results) No results for input(s): PROBNP in the last 8760 hours. HbA1C: No results for input(s): HGBA1C in the last 72 hours. CBG: Recent Labs  Lab 01/28/18 0757 01/29/18 0756 01/30/18 0736 02/01/18 0757 02/02/18 0735  GLUCAP 110* 103* 108* 111* 109*   Lipid Profile: No results for input(s): CHOL, HDL, LDLCALC, TRIG, CHOLHDL, LDLDIRECT in the last 72 hours. Thyroid Function Tests: No results for input(s): TSH, T4TOTAL, FREET4, T3FREE, THYROIDAB in the last 72 hours. Anemia Panel: No results for input(s): VITAMINB12, FOLATE, FERRITIN, TIBC, IRON, RETICCTPCT in the last 72 hours. Sepsis Labs: Recent Labs  Lab 02/02/18 1227  PROCALCITON 2.24    Recent Results (from the past 240 hour(s))  Culture, Urine     Status: None   Collection Time: 01/27/18  5:23 PM  Result Value Ref Range Status   Specimen Description   Final    URINE, RANDOM Performed at [facility], [address] [place], [place] [address]    Special Requests   Final    NONE Performed at [facility], [address] [place], [place] [address]    Culture   Final    Multiple bacterial morphotypes present, none predominant. Suggest appropriate recollection if clinically indicated.   Report Status 01/29/2018 FINAL  Final  MRSA PCR Screening     Status: None   Collection Time: 01/29/18  2:13 AM  Result Value Ref Range Status   MRSA by PCR NEGATIVE NEGATIVE Final    Comment:        The GeneXpert  MRSA Assay (FDA approved for NASAL specimens only), is one component of a comprehensive MRSA colonization surveillance program. It is not intended to diagnose MRSA infection nor to guide or monitor treatment for MRSA infections. Performed at [facility], [address] [place], [place] [address]          Radiology Studies: Dg Abd 1 View  Result Date: 02/02/2018 CLINICAL DATA:  Abdominal distention. EXAM: ABDOMEN - 1 VIEW COMPARISON:  02/01/2018, 01/30/2018.  CT 01/27/2018. FINDINGS: Persistent bowel distention particularly of the sigmoid is again noted. Similar findings noted on prior exam. No free air. Calcific density left upper quadrant unchanged. Degenerative changes scoliosis thoracic and lumbar spine again noted. IMPRESSION: Persistent bowel distention particularly of the sigmoid colon. No significant change from prior exam. Electronically Signed   By: Maisie Fushomas  Register   On: 02/02/2018 09:57   Dg Abd 1 View  Result Date: 02/01/2018 CLINICAL DATA:  Abdominal distension. EXAM: ABDOMEN - 1 VIEW COMPARISON:  Abdominal radiograph 01/30/2018 FINDINGS: Read demonstrated marked gaseous distention of multiple loops of large and small bowel throughout the abdomen. Supine evaluation limited for the detection of free intraperitoneal air. Lumbar spine degenerative changes. Unchanged calcified mass left upper quadrant. IMPRESSION: Read demonstrated gaseous distended loops of large and small bowel throughout the abdomen. Electronically Signed   By: Annia Beltrew  Davis M.D.   On: 02/01/2018 12:06        Scheduled Meds: . aspirin EC  81 mg Oral Daily  . heparin  5,000 Units Subcutaneous Q8H  . metoprolol tartrate  50 mg Oral BID  . potassium chloride  40 mEq Oral TID  . [START ON 02/18/2018] Vitamin D (Ergocalciferol)  50,000 Units Oral  Q30 days   Continuous Infusions: . 0.45 % NaCl with KCl 20 mEq / L 50 mL/hr at 02/03/18 0637  . piperacillin-tazobactam (ZOSYN)  IV 3.375 g  (02/03/18 0513)  . potassium chloride    . sodium phosphate  Dextrose 5% IVPB       LOS: 2 days    Time spent: 35 minutes    Alba Cory, MD [facility] Pager [phone]  If 7PM-7AM, please contact night-coverage [email or web address] Password [facility] 02/03/2018, 7:35 AM

## 2018-02-03 NOTE — Progress Notes (Signed)
RN notified that patient converted from A-fib to NSR.  RN paged Cardiology awaiting response.

## 2018-02-03 NOTE — Progress Notes (Signed)
At change of shift, night RN performing EKG on patient.  Stated patient was tachycardic to 150s/160s around 630am and sustaining.  Patient is oriented to self only (per night shift RN), denies chest pain but did state she was SOB.  2L 02 placed on patient. BP 76/51 on dynamap, 80/50 manually.  EKG showed STEMI.  Rapid response called; Dr. Sunnie Nielsenegalado paged.  Patient moved to stepdown, new orders placed. 1L normal saline bolus started per verbal order from Dr. Sunnie Nielsenegalado.  Patient showing no signs of acute distress at this time.  Transferred to ICU, report given to [facility] [place].

## 2018-02-03 NOTE — Procedures (Signed)
Central Venous Catheter Insertion Procedure Note [name] [id number] 12/15/1940  Procedure: Insertion of Central Venous Catheter Indications: Drug and/or fluid administration and Frequent blood sampling  Procedure Details Consent: Risks of procedure as well as the alternatives and risks of each were explained to the (patient/caregiver).  Consent for procedure obtained. Time Out: Verified patient identification, verified procedure, site/side was marked, verified correct patient position, special equipment/implants available, medications/allergies/relevent history reviewed, required imaging and test results available.  Performed Real time US was used to ID and cannulate the right IJ  Maximum sterile technique was used including antiseptics, cap, gloves, gown, hand hygiene, mask and sheet. Skin prep: Chlorhexidine; local anesthetic administered A antimicrobial bonded/coated triple lumen catheter was placed in the right internal jugular vein using the Seldinger technique.  Evaluation Blood flow good Complications: No apparent complications Patient did tolerate procedure well. Chest X-ray ordered to verify placement.  CXR: pending.  [name] Case 02/03/2018, 11:18 AM [name] MartinetPeter E Riad [name] ACNP-BC [facility] Pager # [phone] OR # [phone] if no answer

## 2018-02-04 ENCOUNTER — Inpatient Hospital Stay (HOSPITAL_COMMUNITY): Payer: Medicare Other

## 2018-02-04 DIAGNOSIS — R7989 Other specified abnormal findings of blood chemistry: Secondary | ICD-10-CM

## 2018-02-04 DIAGNOSIS — L8990 Pressure ulcer of unspecified site, unspecified stage: Secondary | ICD-10-CM

## 2018-02-04 DIAGNOSIS — I2510 Atherosclerotic heart disease of native coronary artery without angina pectoris: Secondary | ICD-10-CM

## 2018-02-04 LAB — CBC WITH DIFFERENTIAL/PLATELET
Abs Immature Granulocytes: 0.22 10*3/uL — ABNORMAL HIGH (ref 0.00–0.07)
Basophils Absolute: 0 10*3/uL (ref 0.0–0.1)
Basophils Relative: 0 %
Eosinophils Absolute: 0.5 10*3/uL (ref 0.0–0.5)
Eosinophils Relative: 4 %
HCT: 24.4 % — ABNORMAL LOW (ref 36.0–46.0)
Hemoglobin: 7.7 g/dL — ABNORMAL LOW (ref 12.0–15.0)
Immature Granulocytes: 2 %
Lymphocytes Relative: 14 %
Lymphs Abs: 1.9 10*3/uL (ref 0.7–4.0)
MCH: 25.8 pg — ABNORMAL LOW (ref 26.0–34.0)
MCHC: 31.6 g/dL (ref 30.0–36.0)
MCV: 81.6 fL (ref 80.0–100.0)
Monocytes Absolute: 1.1 10*3/uL — ABNORMAL HIGH (ref 0.1–1.0)
Monocytes Relative: 8 %
Neutro Abs: 10 10*3/uL — ABNORMAL HIGH (ref 1.7–7.7)
Neutrophils Relative %: 72 %
Platelets: 171 10*3/uL (ref 150–400)
RBC: 2.99 MIL/uL — ABNORMAL LOW (ref 3.87–5.11)
RDW: 16.1 % — ABNORMAL HIGH (ref 11.5–15.5)
WBC: 13.7 10*3/uL — ABNORMAL HIGH (ref 4.0–10.5)
nRBC: 0 % (ref 0.0–0.2)

## 2018-02-04 LAB — ECHOCARDIOGRAM COMPLETE
Height: 62 in
Weight: 2673.74 oz

## 2018-02-04 LAB — BASIC METABOLIC PANEL
Anion gap: 4 — ABNORMAL LOW (ref 5–15)
BUN: 19 mg/dL (ref 8–23)
CO2: 19 mmol/L — ABNORMAL LOW (ref 22–32)
Calcium: 7.5 mg/dL — ABNORMAL LOW (ref 8.9–10.3)
Chloride: 121 mmol/L — ABNORMAL HIGH (ref 98–111)
Creatinine, Ser: 0.71 mg/dL (ref 0.44–1.00)
GFR calc Af Amer: >60 mL/min (ref >60)
GFR calc non Af Amer: >60 mL/min (ref >60)
Glucose, Bld: 108 mg/dL — ABNORMAL HIGH (ref 70–99)
Potassium: 3.1 mmol/L — ABNORMAL LOW (ref 3.5–5.1)
Sodium: 144 mmol/L (ref 135–145)

## 2018-02-04 LAB — RENAL FUNCTION PANEL
Albumin: 2.3 g/dL — ABNORMAL LOW (ref 3.5–5.0)
Anion gap: 5 (ref 5–15)
BUN: 18 mg/dL (ref 8–23)
CO2: 18 mmol/L — ABNORMAL LOW (ref 22–32)
Calcium: 7.6 mg/dL — ABNORMAL LOW (ref 8.9–10.3)
Chloride: 120 mmol/L — ABNORMAL HIGH (ref 98–111)
Creatinine, Ser: 0.74 mg/dL (ref 0.44–1.00)
GFR calc Af Amer: >60 mL/min (ref >60)
GFR calc non Af Amer: >60 mL/min (ref >60)
Glucose, Bld: 107 mg/dL — ABNORMAL HIGH (ref 70–99)
Phosphorus: 1.6 mg/dL — ABNORMAL LOW (ref 2.5–4.6)
Potassium: 3.1 mmol/L — ABNORMAL LOW (ref 3.5–5.1)
Sodium: 143 mmol/L (ref 135–145)

## 2018-02-04 LAB — HEPATIC FUNCTION PANEL
ALT: 32 U/L (ref 0–44)
AST: 29 U/L (ref 15–41)
Albumin: 2.2 g/dL — ABNORMAL LOW (ref 3.5–5.0)
Alkaline Phosphatase: 39 U/L (ref 38–126)
Bilirubin, Direct: 0.1 mg/dL (ref 0.0–0.2)
Indirect Bilirubin: 0.6 mg/dL (ref 0.3–0.9)
Total Bilirubin: 0.7 mg/dL (ref 0.3–1.2)
Total Protein: 4.5 g/dL — ABNORMAL LOW (ref 6.5–8.1)

## 2018-02-04 LAB — GLUCOSE, CAPILLARY: Glucose-Capillary: 93 mg/dL (ref 70–99)

## 2018-02-04 LAB — CBC
HCT: 26.7 % — ABNORMAL LOW (ref 36.0–46.0)
Hemoglobin: 8.4 g/dL — ABNORMAL LOW (ref 12.0–15.0)
MCH: 26.6 pg (ref 26.0–34.0)
MCHC: 31.5 g/dL (ref 30.0–36.0)
MCV: 84.5 fL (ref 80.0–100.0)
Platelets: 165 10*3/uL (ref 150–400)
RBC: 3.16 MIL/uL — ABNORMAL LOW (ref 3.87–5.11)
RDW: 16.1 % — ABNORMAL HIGH (ref 11.5–15.5)
WBC: 14.7 10*3/uL — ABNORMAL HIGH (ref 4.0–10.5)
nRBC: 0 % (ref 0.0–0.2)

## 2018-02-04 LAB — HEMOGLOBIN AND HEMATOCRIT, BLOOD
HCT: 23.2 % — ABNORMAL LOW (ref 36.0–46.0)
Hemoglobin: 7.3 g/dL — ABNORMAL LOW (ref 12.0–15.0)

## 2018-02-04 LAB — OCCULT BLOOD X 1 CARD TO LAB, STOOL: Fecal Occult Bld: POSITIVE — AB

## 2018-02-04 LAB — PREPARE RBC (CROSSMATCH)

## 2018-02-04 LAB — PHOSPHORUS: Phosphorus: 1.6 mg/dL — ABNORMAL LOW (ref 2.5–4.6)

## 2018-02-04 LAB — ABO/RH: ABO/RH(D): O POS

## 2018-02-04 LAB — MAGNESIUM: Magnesium: 2.2 mg/dL (ref 1.7–2.4)

## 2018-02-04 MED ORDER — K PHOS MONO-SOD PHOS DI & MONO 155-852-130 MG PO TABS
250.0000 mg | ORAL_TABLET | Freq: Every day | ORAL | Status: DC
  Filled 2018-02-04 (×2): qty 1

## 2018-02-04 MED ORDER — POTASSIUM CHLORIDE CRYS ER 20 MEQ PO TBCR
40.0000 meq | EXTENDED_RELEASE_TABLET | ORAL | Status: AC
  Administered 2018-02-04 (×2): 40 meq via ORAL
  Filled 2018-02-04 (×2): qty 2

## 2018-02-04 MED ORDER — PANTOPRAZOLE SODIUM 40 MG IV SOLR
40.0000 mg | Freq: Two times a day (BID) | INTRAVENOUS | Status: DC
  Administered 2018-02-04 – 2018-02-12 (×18): 40 mg via INTRAVENOUS
  Filled 2018-02-04 (×18): qty 40

## 2018-02-04 MED ORDER — SODIUM CHLORIDE 0.9% IV SOLUTION: Freq: Once | INTRAVENOUS | Status: AC

## 2018-02-04 MED ORDER — SODIUM PHOSPHATES 45 MMOLE/15ML IV SOLN
20.0000 mmol | Freq: Once | INTRAVENOUS | Status: AC
  Administered 2018-02-04: 20 mmol via INTRAVENOUS
  Filled 2018-02-04: qty 6.67

## 2018-02-04 NOTE — Progress Notes (Signed)
Progress Note  Patient Name: [name] Date of [name]: 02/04/2018  Primary Cardiologist: [name] RedBridgette Rotha Cassels, MD   Subjective   No complaints this AM. Specifically denies chest pain or shortness of breath.  Inpatient Medications    Scheduled Meds: . amiodarone  400 mg Oral BID  . aspirin EC  81 mg Oral Daily  . Chlorhexidine Gluconate Cloth  6 each Topical Daily  . heparin  5,000 Units Subcutaneous Q8H  . metoprolol tartrate  50 mg Oral BID  . potassium chloride  40 mEq Oral TID  . sodium chloride flush  10-40 mL Intracatheter Q12H  . [START ON 02/18/2018] Vitamin D (Ergocalciferol)  50,000 Units Oral Q30 days   Continuous Infusions: . 0.45 % NaCl with KCl 20 mEq / L 100 mL/hr at 02/04/18 0300  . piperacillin-tazobactam (ZOSYN)  IV 3.375 g (02/04/18 16100633)   PRN Meds: acetaminophen **OR** acetaminophen, Gerhardt's butt cream, levalbuterol, metoprolol tartrate, ondansetron **OR** ondansetron (ZOFRAN) IV, sodium chloride flush   Vital Signs    Vitals:   02/04/18 0400 02/04/18 0500 02/04/18 0600 02/04/18 0700  BP: (!) 108/46 (!) 125/46 (!) 101/56 (!) 106/42  Pulse: 77 70 71 77  Resp: 20 14 13 14   Temp:      TempSrc:      SpO2: 100% 97% 99% 98%  Weight:  78.2 kg    Height:        Intake/Output Summary (Last 24 hours) at 02/04/2018 0736 Last data filed at 02/04/2018 0700 Gross per 24 hour  Intake 3067.25 ml  Output 700 ml  Net 2367.25 ml   Filed Weights   02/03/18 0532 02/03/18 0805 02/04/18 0500  Weight: 75.3 kg 75.8 kg 78.2 kg    Telemetry    Converted back to SR yesterday. Most recent telemetry SR with artifact - Personally Reviewed  ECG    No new since yesterday - Personally Reviewed  Physical Exam   GEN: No acute distress.   Neck: No JVD, right IJ CVC line in place Cardiac: RRR, no murmurs, rubs, or gallops.  Respiratory: Clear to auscultation bilaterally. GI: Soft, nontender, non-distended  MS: Non pitting trace bilateral LE edema; No  deformity. Neuro:  left facial/eye droop Psych: Normal affect, not fully oriented  Labs    Chemistry Recent Labs  Lab 02/02/18 0432 02/02/18 1904 02/03/18 0529 02/04/18 0500  NA 142  --  142 143  144  K 2.5* 3.5 3.0* 3.1*  3.1*  CL 114*  --  116* 120*  121*  CO2 21*  --  20* 18*  19*  GLUCOSE 126*  --  88 107*  108*  BUN 23  --  28* 18  19  CREATININE 0.83  --  0.84 0.74  0.71  CALCIUM 7.7*  --  7.4* 7.6*  7.5*  ALBUMIN 2.4*  --  2.2* 2.3*  GFRNONAA >60  --  >60 >60  >60  GFRAA >60  --  >60 >60  >60  ANIONGAP 7  --  6 5  4*     Hematology Recent Labs  Lab 01/31/18 0548 02/02/18 0432 02/03/18 0528  WBC 11.8* 19.8* 13.6*  RBC 4.78 4.43 3.75*  HGB 12.2 11.3* 9.6*  HCT 38.1 35.6* 30.5*  MCV 79.7* 80.4 81.3  MCH 25.5* 25.5* 25.6*  MCHC 32.0 31.7 31.5  RDW 15.7* 15.8* 16.0*  PLT 174 199 183    Cardiac Enzymes Recent Labs  Lab 02/03/18 1345 02/03/18 2044  TROPONINI 0.09* 0.23*   No  results for input(s): TROPIPOC in the last 168 hours.   BNPNo results for input(s): BNP, PROBNP in the last 168 hours.   DDimer No results for input(s): DDIMER in the last 168 hours.   Radiology    Dg Abd 1 View  Result Date: 02/02/2018 CLINICAL DATA:  Abdominal distention. EXAM: ABDOMEN - 1 VIEW COMPARISON:  02/01/2018, 01/30/2018.  CT 01/27/2018. FINDINGS: Persistent bowel distention particularly of the sigmoid is again noted. Similar findings noted on prior exam. No free air. Calcific density left upper quadrant unchanged. Degenerative changes scoliosis thoracic and lumbar spine again noted. IMPRESSION: Persistent bowel distention particularly of the sigmoid colon. No significant change from prior exam. Electronically Signed   By: Maisie Fus  Register   On: 02/02/2018 09:57   Dg Chest Port 1 View  Result Date: 02/03/2018 CLINICAL DATA:  Central line placement EXAM: PORTABLE CHEST 1 VIEW COMPARISON:  01/30/2018 FINDINGS: Right central line is been placed with the tip in  the upper SVC. No pneumothorax. Low lung volumes. Patchy right lung airspace opacities, improved since prior study. No focal opacity on the left. No effusions or acute bony abnormality. IMPRESSION: Right central line placement with the tip in the upper right atrium. No pneumothorax. Low lung volumes with patchy right lung airspace disease. Improved aeration on the left. Electronically Signed   By: Charlett Nose M.D.   On: 02/03/2018 12:30   Dg Abd Portable 1v  Result Date: 02/03/2018 CLINICAL DATA:  Tachycardia EXAM: PORTABLE ABDOMEN - 1 VIEW COMPARISON:  02/02/2018 01/28/2018; CT abdomen pelvis-01/27/2018 FINDINGS: Similar-appearing marked distension of the colon without significant interval change. No supine evidence of pneumoperitoneum. No pneumatosis or portal venous gas Peripherally calcified splenic mass overlies the left upper abdominal quadrant. Moderate severe multilevel lumbar spine. No acute osseous abnormalities. Multiple phleboliths overlie the lower pelvis bilaterally. IMPRESSION: Marked gaseous distention of the colon without interval change. No definite pneumoperitoneum, pneumatosis or portal venous gas. Electronically Signed   By: Simonne Come M.D.   On: 02/03/2018 09:15    Cardiac Studies   Echo 12/31--read by me, see notes below  Patient Profile     78 y.o. female with history of paroxysmal AF, Parkinson's, dementia, living in SNF who was admitted for fecal impaction. We were called 12/31 when she went into afib RVR, and ECG was concerning for posterior infarct. She has no complaints and denies any chest pain on interview, even when going 150 BPM.   Assessment & Plan    A fib with RVR: spontaneously converted to NSR during central line placement. Duration of afib several hours -given hypotension, avoided diltiazem -started oral amiodarone (lack of IV access), now will load orally (400 mg BID for one week, then 200 mg daily) -chadsvasc at least 7, but very high risk of bleeding,  see below. No heparin for now. -has been very hypokalemic, keep K >4, Mg >2  Abnormal EKG with known CAD by cath 2013: ECG yesterday concerning for possible posterior STEMI. She was hemodynamically stable and chest pain free at the time, was in afib RVR at around 140-150 bpm.  -troponins elevated slightly, from 0.09 to 0.23 -I read her echo this AM. Very limited windows, technically difficult. Overall LV EF is preserved. There may be some mild hypokinesis of the inferolateral wall. There is also a small-moderate pericardial effusion without hemodynamic compromise, with some adherent echo dense material on the anterior aspect of the heart. -given that her Hgb has dropped further this AM, from 9.6 to 7.7 and  from 13.7 on admission, I do not think she is a good candidate for heparin, despite the elevated troponins and mild wall motion abnormality. If bleeding location is found and able to be treated, we would reconsider. -given her anemia, history of GI bleed, lack of symptoms I do not think she would be a good candidate for cath at this time. Continue to monitor.  -will recheck LFTs today, if normal would start atorvastatin 40 mg.  Parkinson's disease with dementia and now decreased appetite, may be end stage.  She is a DNR. Defer to primary team  She is unfortunately complex given the risk of bleeding vs. Possible benefits of anticoagulation. We will continue to follow with you.  TIME SPENT WITH PATIENT: >25 minutes of direct patient care. More than 50% of that time was spent on coordination of care and counseling regarding risk vs. Benefit of treatment options.  [name] RedBridgette Lorretta Kerce, MD, PhD [facility]  [facility]   For questions or updates, please contact [facility] Please consult [email or web address] for contact info under     Signed, [name] RedBridgette Svetlana Bagby, MD  02/04/2018, 7:36 AM

## 2018-02-04 NOTE — Progress Notes (Addendum)
PROGRESS NOTE    [name]  ZOX:[id number] DOB: 02-Mar-1940 DOA: 01/27/2018 PCP: Nadara Eaton, MD    Brief Narrative ; 78 year old with past medical history significant for A. fib, chronic diastolic heart failure, coronary artery disease, Parkinson disease, history of CVA with right-sided weakness who was admitted with abdominal distention, poor oral intake and constipation. Patient was found to have severe abdominal distention and CT of the abdomen pelvis showed gaseous distention of the colon the very large stool burden identified within the rectum which favored a rectal impaction. Patient was found to have an AKI likely due to her poor p.o. intake.  GI was consulted for fecal impaction and colonic distention.  GI have been helping with management, GoLYTELY was ordered.  Unclear if patient has received or able to tolerate the GoLYTELY.  Patient has received multiple.  Patient was treated for hypernatremia and AKI.  The morning of 12-31, patient developed A. fib with RVR, shortness of breath.  EKG with ST depression.  Patient was also hypotensive with blood pressure in the 70s.  She received 1 L of IV bolus.  When her blood pressure improved she received a low dose metoprolol.  Cardiology was consulted.  Amiodarone was order.   Assessment & Plan:   Active Problems:   Essential hypertension   Coronary atherosclerosis   Atrial fibrillation (HCC)   Diastolic CHF (HCC)   Ambulatory dysfunction   Parkinson disease (HCC)   Constipation   Dementia due to Parkinson's disease with behavioral disturbance (HCC)   Hypokalemia   AKI (acute kidney injury) (HCC)   Leukocytosis   Acute on chronic respiratory failure with hypoxia (HCC)   Hyperbilirubinemia   Abdominal distention   Difficult intravenous access   Pressure injury of skin   1-A. fib with RVR, Hypotension;  Patient received IV fluids.  Blood pressure increased to the 100 range.  IV metoprolol PRN was ordered. Cardiology  was consulted, patient was a started on amiodarone. Patient subsequently sinus convert on her own before initiation of amiodarone. Improved. Now on oral amiodarone.  Hb drop today. Hold on start anticoagulation  Might be at high risk for bleeding.  Appreciate cardiology help/   2-Abdominal distention; in the setting of severe constipation and fecal impaction GI consulted and following Patient receive smog enema without any improvement. Per GI.  KUB persistent distension.  Had multiple watery , dark BM. Has flexyseal.   Anemia; acute blood loss.  Dark stool. Start IV Protonix.  Check occult blood. Hb decreasing now at 7.3 from 9 from 11.   3-hypokalemia hypophosphatemia; Replete IV and oral.   AKI; creatinine on admission at 3.1 Improved with IV fluids.  Pneumonia, leukocytosis; Question of possible aspiration. Continue with IV Zosyn.  Acute hypoxic respiratory failure Suspect related to aspiration. Continue with oxygen supplement..  Dementia Parkinson disease Delirium precaution. On quetiapine.   Hypertension hold benazepril.             Estimated body mass index is 31.53 kg/m as calculated from the following:   Height as of this encounter: 5\' 2"  (1.575 m).   Weight as of this encounter: 78.2 kg.   DVT prophylaxis: heparin  Code Status: DNR Family Communication: no family at bedside.  Disposition Plan: remain in the step down unit.    Consultants:   Cardiology  GI   Procedures:  none  Antimicrobials Zosyn   Subjective: Alert, denies abdominal pain. Having dark, watery Bowel movement.   Objective: Vitals:   02/04/18 0700  02/04/18 0747 02/04/18 0800 02/04/18 1200  BP: (!) 106/42 (!) 109/54    Pulse: 77 85    Resp: 14 17    Temp:   98.1 F (36.7 C) 98.5 F (36.9 C)  TempSrc:   Oral Oral  SpO2: 98% 99%    Weight:      Height:        Intake/Output Summary (Last 24 hours) at 02/04/2018 1425 Last data filed at 02/04/2018 0700 Gross per  24 hour  Intake 1659.99 ml  Output 700 ml  Net 959.99 ml   Filed Weights   02/03/18 0532 02/03/18 0805 02/04/18 0500  Weight: 75.3 kg 75.8 kg 78.2 kg    Examination:  General exam: NAD Respiratory system: CTA Cardiovascular system: S 1, S 2 RRR Gastrointestinal system: BS present, soft, nt Central nervous system: Alert.  Extremities: symmetric power.  Skin: no rashes.      Data Reviewed: I have personally reviewed following labs and imaging studies  CBC: Recent Labs  Lab 01/30/18 0431 01/31/18 0548 02/02/18 0432 02/03/18 0528 02/04/18 0500  WBC 13.3* 11.8* 19.8* 13.6* 13.7*  NEUTROABS 11.1* 8.0* 15.7* 8.8* 10.0*  HGB 13.7 12.2 11.3* 9.6* 7.7*  HCT 44.2 38.1 35.6* 30.5* 24.4*  MCV 82.2 79.7* 80.4 81.3 81.6  PLT 201 174 199 183 171   Basic Metabolic Panel: Recent Labs  Lab 01/29/18 0408  01/31/18 0548 01/31/18 1715 02/01/18 0534 02/02/18 0432 02/02/18 1904 02/03/18 0528 02/03/18 0529 02/04/18 0500  NA 150*  149*   < > 148* 144 141 142  --   --  142 143  144  K 3.4*  3.4*   < > 2.7* 3.4* 3.7 2.5* 3.5  --  3.0* 3.1*  3.1*  CL 121*  121*   < > 119* 118* 115* 114*  --   --  116* 120*  121*  CO2 18*  18*   < > 21* 20* 20* 21*  --   --  20* 18*  19*  GLUCOSE 111*  109*   < > 112* 100* 116* 126*  --   --  88 107*  108*  BUN 43*  43*   < > 12 9 11 23   --   --  28* 18  19  CREATININE 1.42*  1.39*   < > 1.02* 0.86 0.87 0.83  --   --  0.84 0.74  0.71  CALCIUM 8.7*  8.7*   < > 8.2* 8.1* 7.8* 7.7*  --   --  7.4* 7.6*  7.5*  MG 2.7*  --  2.1  --   --  2.3  --  2.0  --  2.2  PHOS 1.7*  1.6*   < > <0.1*  --  <1.0* 2.0*  --   --  1.6* 1.6*  1.6*   < > = values in this interval not displayed.   GFR: Estimated Creatinine Clearance: 55.2 mL/min (by C-G formula based on SCr of 0.74 mg/dL). Liver Function Tests: Recent Labs  Lab 01/31/18 0548 02/01/18 0534 02/02/18 0432 02/03/18 0529 02/04/18 0500  AST  --   --   --   --  29  ALT  --   --   --   --   32  ALKPHOS  --   --   --   --  39  BILITOT  --   --   --   --  0.7  PROT  --   --   --   --  4.5*  ALBUMIN 2.8* 2.6* 2.4* 2.2* 2.2*  2.3*   No results for input(s): LIPASE, AMYLASE in the last 168 hours. No results for input(s): AMMONIA in the last 168 hours. Coagulation Profile: No results for input(s): INR, PROTIME in the last 168 hours. Cardiac Enzymes: Recent Labs  Lab 02/03/18 1345 02/03/18 2044  TROPONINI 0.09* 0.23*   BNP (last 3 results) No results for input(s): PROBNP in the last 8760 hours. HbA1C: No results for input(s): HGBA1C in the last 72 hours. CBG: Recent Labs  Lab 01/30/18 0736 02/01/18 0757 02/02/18 0735 02/03/18 0811 02/04/18 0800  GLUCAP 108* 111* 109* 88 93   Lipid Profile: No results for input(s): CHOL, HDL, LDLCALC, TRIG, CHOLHDL, LDLDIRECT in the last 72 hours. Thyroid Function Tests: No results for input(s): TSH, T4TOTAL, FREET4, T3FREE, THYROIDAB in the last 72 hours. Anemia Panel: No results for input(s): VITAMINB12, FOLATE, FERRITIN, TIBC, IRON, RETICCTPCT in the last 72 hours. Sepsis Labs: Recent Labs  Lab 02/02/18 1227 02/03/18 1332  PROCALCITON 2.24  --   LATICACIDVEN  --  1.9    Recent Results (from the past 240 hour(s))  Culture, Urine     Status: None   Collection Time: 01/27/18  5:23 PM  Result Value Ref Range Status   Specimen Description   Final    URINE, RANDOM Performed at [facility], [address], [place], [place] [address]    Special Requests   Final    NONE Performed at [facility], [address] [place], [place] [address]    Culture   Final    Multiple bacterial morphotypes present, none predominant. Suggest appropriate recollection if clinically indicated.   Report Status 01/29/2018 FINAL  Final  MRSA PCR Screening     Status: None   Collection Time: 01/29/18  2:13 AM  Result Value Ref Range Status   MRSA by PCR NEGATIVE NEGATIVE Final    Comment:        The  GeneXpert MRSA Assay (FDA approved for NASAL specimens only), is one component of a comprehensive MRSA colonization surveillance program. It is not intended to diagnose MRSA infection nor to guide or monitor treatment for MRSA infections. Performed at [facility], [address], [place], [place] [address]          Radiology Studies: Dg Abd 1 View  Result Date: 02/04/2018 CLINICAL DATA:  Fecal impaction.  Abdominal distension. EXAM: ABDOMEN - 1 VIEW COMPARISON:  Abdominal radiograph 02/03/2018 FINDINGS: Persistent gaseous distended loop of bowel within the central abdomen. Gas within nondilated loops of small bowel. Peripheral calcified splenic mass left upper quadrant. No definite pneumatosis or free intraperitoneal air. Lumbar spine degenerative changes. IMPRESSION: Persistent marked gaseous distention of the colon. Electronically Signed   By: Annia Belt M.D.   On: 02/04/2018 10:57   Dg Chest Port 1 View  Result Date: 02/03/2018 CLINICAL DATA:  Central line placement EXAM: PORTABLE CHEST 1 VIEW COMPARISON:  01/30/2018 FINDINGS: Right central line is been placed with the tip in the upper SVC. No pneumothorax. Low lung volumes. Patchy right lung airspace opacities, improved since prior study. No focal opacity on the left. No effusions or acute bony abnormality. IMPRESSION: Right central line placement with the tip in the upper right atrium. No pneumothorax. Low lung volumes with patchy right lung airspace disease. Improved aeration on the left. Electronically Signed   By: Charlett Nose M.D.   On: 02/03/2018 12:30   Dg Abd Portable 1v  Result Date: 02/03/2018 CLINICAL DATA:  Tachycardia EXAM: PORTABLE ABDOMEN - 1 VIEW COMPARISON:  02/02/2018 01/28/2018; CT abdomen pelvis-01/27/2018 FINDINGS: Similar-appearing marked distension of the colon without significant interval change. No supine evidence of pneumoperitoneum. No pneumatosis or portal venous gas Peripherally  calcified splenic mass overlies the left upper abdominal quadrant. Moderate severe multilevel lumbar spine. No acute osseous abnormalities. Multiple phleboliths overlie the lower pelvis bilaterally. IMPRESSION: Marked gaseous distention of the colon without interval change. No definite pneumoperitoneum, pneumatosis or portal venous gas. Electronically Signed   By: Simonne ComeJohn  Watts M.D.   On: 02/03/2018 09:15        Scheduled Meds: . amiodarone  400 mg Oral BID  . aspirin EC  81 mg Oral Daily  . Chlorhexidine Gluconate Cloth  6 each Topical Daily  . heparin  5,000 Units Subcutaneous Q8H  . metoprolol tartrate  50 mg Oral BID  . pantoprazole (PROTONIX) IV  40 mg Intravenous Q12H  . potassium chloride  40 mEq Oral Q4H  . sodium chloride flush  10-40 mL Intracatheter Q12H  . [START ON 02/18/2018] Vitamin D (Ergocalciferol)  50,000 Units Oral Q30 days   Continuous Infusions: . 0.45 % NaCl with KCl 20 mEq / L 100 mL/hr at 02/04/18 0300  . piperacillin-tazobactam (ZOSYN)  IV Stopped (02/04/18 1130)  . sodium phosphate  Dextrose 5% IVPB 20 mmol (02/04/18 1129)     LOS: 3 days    Time spent: 35 minutes    Alba CoryBelkys A Danniela Mcbrearty, MD [facility] Pager [phone]  If 7PM-7AM, please contact night-coverage [email or web address] Password [email or web address] 02/04/2018, 2:25 PM

## 2018-02-04 NOTE — Progress Notes (Signed)
[facility] Gastroenterology Progress Note  [name] 78 y.o. Sep 04, 1940  CC: Constipation/colonic ileus   Subjective: Patient completed GoLYTELY.  Had multiple liquid bowel movements.  Currently has a Flexi-Seal with  dark liquid content.  Patient denies abdominal pain, nausea vomiting.    Objective: Vital signs in last 24 hours: Vitals:   02/04/18 0800 02/04/18 1200  BP:    Pulse:    Resp:    Temp: 98.1 F (36.7 C) 98.5 F (36.9 C)  SpO2:      Physical Exam:  General.  Alert/oriented x3.  Not in acute distress Abdomen.  Soft, minimally distended, hypoactive bowel sounds.,  No peritoneal signs.  Nontender  Lab Results: Recent Labs    02/03/18 0528 02/03/18 0529 02/04/18 0500  NA  --  142 143  144  K  --  3.0* 3.1*  3.1*  CL  --  116* 120*  121*  CO2  --  20* 18*  19*  GLUCOSE  --  88 107*  108*  BUN  --  28* 18  19  CREATININE  --  0.84 0.74  0.71  CALCIUM  --  7.4* 7.6*  7.5*  MG 2.0  --  2.2  PHOS  --  1.6* 1.6*  1.6*   Recent Labs    02/03/18 0529 02/04/18 0500  AST  --  29  ALT  --  32  ALKPHOS  --  39  BILITOT  --  0.7  PROT  --  4.5*  ALBUMIN 2.2* 2.2*  2.3*   Recent Labs    02/03/18 0528 02/04/18 0500  WBC 13.6* 13.7*  NEUTROABS 8.8* 10.0*  HGB 9.6* 7.7*  HCT 30.5* 24.4*  MCV 81.3 81.6  PLT 183 171   No results for input(s): LABPROT, INR in the last 72 hours.    Assessment/Plan: -Constipation and colonic ileus -Dark liquid in the rectal tube. - A-Fib with RVR.  Spontaneously converted to NSR Recommendations -------------------------- -Advance diet to full liquid. -Check x-ray in the morning. -Repeat CBC in the morning. -GI will follow   Kathi Der MD, FACP 02/04/2018, 12:21 PM  Contact #  [phone]

## 2018-02-04 NOTE — Evaluation (Signed)
Physical Therapy Evaluation Patient Details Name: [name] KinsBarbara J [name] MRN: [id number] DOB: 01/16/1941 Today's Date: 02/04/2018   History of Present Illness  Pt admitted from SNF with colonic ileus and constipation/abdominal distention..  Pt with hx of demntia, parkinsons, CHF, A-fib and CVA with residual R side weakness  Clinical Impression  Pt admitted as above and presenting with functional mobility limitations 2* generalized weakness, balance deficits and dementia related cognitive deficits.  Pt would benefit from return to SNF level care at [facility].    Follow Up Recommendations SNF    Equipment Recommendations  None recommended by PT    Recommendations for Other Services       Precautions / Restrictions Precautions Precautions: Fall Precaution Comments: fecal tube in place Restrictions Weight Bearing Restrictions: No Other Position/Activity Restrictions: WBAT      Mobility  Bed Mobility Overal bed mobility: Needs Assistance Bed Mobility: Supine to Sit;Sit to Supine;Rolling Rolling: Max assist   Supine to sit: Max assist;+2 for physical assistance Sit to supine: Max assist;+2 for physical assistance   General bed mobility comments: Pt following minimal cues and requiring max assist of tw to move supine to sit;  Pt unable to balance unassisted at EOB  Transfers Overall transfer level: Needs assistance Equipment used: Rolling walker (2 wheeled) Transfers: Sit to/from Stand Sit to Stand: Max assist;+2 physical assistance;+2 safety/equipment;From elevated surface         General transfer comment: Attempted standing with pt but with min participation by pt.  Pt to semi erect posture only and with LEs attempting to ski out from under - returned to sitting  Ambulation/Gait                Stairs            Wheelchair Mobility    Modified Rankin (Stroke Patients Only)       Balance Overall balance assessment: Needs assistance Sitting-balance  support: Bilateral upper extremity supported;Feet supported Sitting balance-Leahy Scale: Poor     Standing balance support: Bilateral upper extremity supported Standing balance-Leahy Scale: Zero                               Pertinent Vitals/Pain Pain Assessment: No/denies pain    Home Living Family/patient expects to be discharged to:: Skilled nursing facility                      Prior Function Level of Independence: Needs assistance         Comments: Pt unable to provide information 2* dementia     Hand Dominance        Extremity/Trunk Assessment   Upper Extremity Assessment Upper Extremity Assessment: Generalized weakness    Lower Extremity Assessment Lower Extremity Assessment: Generalized weakness       Communication   Communication: No difficulties  Cognition Arousal/Alertness: Awake/alert Behavior During Therapy: WFL for tasks assessed/performed Overall Cognitive Status: History of cognitive impairments - at baseline                                 General Comments: pt agreeable to all requests but with NO follow through      General Comments      Exercises     Assessment/Plan    PT Assessment Patient needs continued PT services  PT Problem List Decreased strength;Decreased activity tolerance;Decreased  balance;Decreased mobility;Decreased knowledge of use of DME;Decreased cognition;Decreased safety awareness;Obesity       PT Treatment Interventions DME instruction;Gait training;Functional mobility training;Therapeutic activities;Therapeutic exercise;Balance training;Patient/family education    PT Goals (Current goals can be found in the Care Plan section)  Acute Rehab PT Goals Patient Stated Goal: I've got to get up and cook PT Goal Formulation: Patient unable to participate in goal setting Time For Goal Achievement: 02/18/18 Potential to Achieve Goals: Fair    Frequency Min 2X/week   Barriers to  discharge        Co-evaluation               AM-PAC PT "6 Clicks" Mobility  Outcome Measure Help needed turning from your back to your side while in a flat bed without using bedrails?: A Lot Help needed moving from lying on your back to sitting on the side of a flat bed without using bedrails?: A Lot Help needed moving to and from a bed to a chair (including a wheelchair)?: Total Help needed standing up from a chair using your arms (e.g., wheelchair or bedside chair)?: Total Help needed to walk in hospital room?: Total Help needed climbing 3-5 steps with a railing? : Total 6 Click Score: 8    End of Session   Activity Tolerance: Patient tolerated treatment well Patient left: in bed;with call bell/phone within reach;with nursing/sitter in room Nurse Communication: Mobility status PT Visit Diagnosis: Muscle weakness (generalized) (M62.81);Difficulty in walking, not elsewhere classified (R26.2)    Time: 7893-8101 PT Time Calculation (min) (ACUTE ONLY): 24 min   Charges:   PT Evaluation $PT Eval Low Complexity: 1 Low          Mauro Kaufmann PT Acute Rehabilitation Services Pager [phone] Office [phone]   Murdis Flitton 02/04/2018, 1:02 PM

## 2018-02-05 ENCOUNTER — Inpatient Hospital Stay (HOSPITAL_COMMUNITY): Payer: Medicare Other

## 2018-02-05 LAB — CBC WITH DIFFERENTIAL/PLATELET
Abs Immature Granulocytes: 0.2 10*3/uL — ABNORMAL HIGH (ref 0.00–0.07)
Basophils Absolute: 0 10*3/uL (ref 0.0–0.1)
Basophils Relative: 0 %
Eosinophils Absolute: 0.6 10*3/uL — ABNORMAL HIGH (ref 0.0–0.5)
Eosinophils Relative: 5 %
HCT: 24.1 % — ABNORMAL LOW (ref 36.0–46.0)
Hemoglobin: 7.5 g/dL — ABNORMAL LOW (ref 12.0–15.0)
Immature Granulocytes: 2 %
Lymphocytes Relative: 16 %
Lymphs Abs: 1.9 10*3/uL (ref 0.7–4.0)
MCH: 26.5 pg (ref 26.0–34.0)
MCHC: 31.1 g/dL (ref 30.0–36.0)
MCV: 85.2 fL (ref 80.0–100.0)
Monocytes Absolute: 0.9 10*3/uL (ref 0.1–1.0)
Monocytes Relative: 8 %
Neutro Abs: 8.2 10*3/uL — ABNORMAL HIGH (ref 1.7–7.7)
Neutrophils Relative %: 69 %
Platelets: 149 10*3/uL — ABNORMAL LOW (ref 150–400)
RBC: 2.83 MIL/uL — ABNORMAL LOW (ref 3.87–5.11)
RDW: 16 % — ABNORMAL HIGH (ref 11.5–15.5)
WBC: 11.7 10*3/uL — ABNORMAL HIGH (ref 4.0–10.5)
nRBC: 0 % (ref 0.0–0.2)

## 2018-02-05 LAB — RENAL FUNCTION PANEL
Albumin: 2.1 g/dL — ABNORMAL LOW (ref 3.5–5.0)
Anion gap: 5 (ref 5–15)
BUN: 15 mg/dL (ref 8–23)
CO2: 19 mmol/L — ABNORMAL LOW (ref 22–32)
Calcium: 7.5 mg/dL — ABNORMAL LOW (ref 8.9–10.3)
Chloride: 122 mmol/L — ABNORMAL HIGH (ref 98–111)
Creatinine, Ser: 0.79 mg/dL (ref 0.44–1.00)
GFR calc Af Amer: >60 mL/min (ref >60)
GFR calc non Af Amer: >60 mL/min (ref >60)
Glucose, Bld: 89 mg/dL (ref 70–99)
Phosphorus: 1.7 mg/dL — ABNORMAL LOW (ref 2.5–4.6)
Potassium: 3.2 mmol/L — ABNORMAL LOW (ref 3.5–5.1)
Sodium: 146 mmol/L — ABNORMAL HIGH (ref 135–145)

## 2018-02-05 LAB — PREPARE RBC (CROSSMATCH)

## 2018-02-05 LAB — GLUCOSE, CAPILLARY: Glucose-Capillary: 86 mg/dL (ref 70–99)

## 2018-02-05 LAB — MAGNESIUM: Magnesium: 2.1 mg/dL (ref 1.7–2.4)

## 2018-02-05 MED ORDER — PSYLLIUM 95 % PO PACK
1.0000 | PACK | Freq: Two times a day (BID) | ORAL | Status: DC
  Administered 2018-02-05 (×2): 1 via ORAL
  Filled 2018-02-05 (×3): qty 1

## 2018-02-05 MED ORDER — SODIUM PHOSPHATES 45 MMOLE/15ML IV SOLN
20.0000 mmol | Freq: Once | INTRAVENOUS | Status: AC
  Administered 2018-02-05: 20 mmol via INTRAVENOUS
  Filled 2018-02-05: qty 6.67

## 2018-02-05 MED ORDER — ENSURE ENLIVE PO LIQD
237.0000 mL | Freq: Two times a day (BID) | ORAL | Status: DC
  Administered 2018-02-05 – 2018-02-09 (×4): 237 mL via ORAL

## 2018-02-05 MED ORDER — METOPROLOL TARTRATE 25 MG PO TABS
25.0000 mg | ORAL_TABLET | Freq: Two times a day (BID) | ORAL | Status: DC
  Administered 2018-02-05 – 2018-02-11 (×14): 25 mg via ORAL
  Filled 2018-02-05 (×15): qty 1

## 2018-02-05 MED ORDER — ADULT MULTIVITAMIN W/MINERALS CH
1.0000 | ORAL_TABLET | Freq: Every day | ORAL | Status: DC
  Administered 2018-02-05 – 2018-02-13 (×9): 1 via ORAL
  Filled 2018-02-05 (×9): qty 1

## 2018-02-05 MED ORDER — K PHOS MONO-SOD PHOS DI & MONO 155-852-130 MG PO TABS
500.0000 mg | ORAL_TABLET | Freq: Two times a day (BID) | ORAL | Status: DC
  Administered 2018-02-05 – 2018-02-07 (×5): 500 mg via ORAL
  Filled 2018-02-05 (×5): qty 2

## 2018-02-05 MED ORDER — POTASSIUM CHLORIDE CRYS ER 20 MEQ PO TBCR
30.0000 meq | EXTENDED_RELEASE_TABLET | ORAL | Status: AC
  Administered 2018-02-05 (×2): 30 meq via ORAL
  Filled 2018-02-05 (×2): qty 1

## 2018-02-05 MED ORDER — ATORVASTATIN CALCIUM 40 MG PO TABS
40.0000 mg | ORAL_TABLET | Freq: Every day | ORAL | Status: DC
  Administered 2018-02-05 – 2018-02-12 (×8): 40 mg via ORAL
  Filled 2018-02-05 (×8): qty 1

## 2018-02-05 MED ORDER — DEXTROSE 5 % IV SOLN
INTRAVENOUS | Status: DC
  Administered 2018-02-05 – 2018-02-06 (×2): via INTRAVENOUS

## 2018-02-05 MED ORDER — SODIUM CHLORIDE 0.9% IV SOLUTION: Freq: Once | INTRAVENOUS | Status: DC

## 2018-02-05 NOTE — Progress Notes (Signed)
Progress Note  Patient Name: [name] KinsBarbara J Brink Date of Encounter: 02/05/2018  Primary Cardiologist: Jodelle RedBridgette Christopher, MD   Subjective   No chest pain and no SOB today, some abd pain to palpation- maintaining SR did have ? arrhythmia of VTach but believe it is artifact rate would be 280  Inpatient Medications    Scheduled Meds: . sodium chloride   Intravenous Once  . amiodarone  400 mg Oral BID  . aspirin EC  81 mg Oral Daily  . Chlorhexidine Gluconate Cloth  6 each Topical Daily  . metoprolol tartrate  50 mg Oral BID  . pantoprazole (PROTONIX) IV  40 mg Intravenous Q12H  . phosphorus  500 mg Oral BID  . potassium chloride  30 mEq Oral Q4H  . sodium chloride flush  10-40 mL Intracatheter Q12H  . [START ON 02/18/2018] Vitamin D (Ergocalciferol)  50,000 Units Oral Q30 days   Continuous Infusions: . dextrose    . piperacillin-tazobactam (ZOSYN)  IV 3.375 g (02/05/18 0537)  . sodium phosphate  Dextrose 5% IVPB     PRN Meds: acetaminophen **OR** acetaminophen, Gerhardt's butt cream, levalbuterol, metoprolol tartrate, ondansetron **OR** ondansetron (ZOFRAN) IV, sodium chloride flush   Vital Signs    Vitals:   02/05/18 0333 02/05/18 0400 02/05/18 0500 02/05/18 0600  BP:  (!) 103/45 (!) 104/46 (!) 107/48  Pulse:  64 73 67  Resp:  12 13 13   Temp:      TempSrc:      SpO2:  100% 100% 100%  Weight: 77.9 kg     Height:        Intake/Output Summary (Last 24 hours) at 02/05/2018 0747 Last data filed at 02/05/2018 0546 Gross per 24 hour  Intake 3437.04 ml  Output 2000 ml  Net 1437.04 ml   Filed Weights   02/03/18 0805 02/04/18 0500 02/05/18 0333  Weight: 75.8 kg 78.2 kg 77.9 kg    Telemetry    SR with PACs and occ PVC one brief episode of I believe artifact, read as V tach/fib - Personally Reviewed  ECG    No new will order to check ST changes of previous - Personally Reviewed  Physical Exam   GEN: No acute distress.   Neck: No JVD Cardiac: RRR, no murmurs,  rubs, or gallops.  Respiratory: Clear to auscultation bilaterally. GI: Soft, mild tenderness, non-distended , rectal tube in place with dark stools MS: No edema; No deformity. Neuro:  Nonfocal  Psych: Normal affect   Labs    Chemistry Recent Labs  Lab 02/03/18 0529 02/04/18 0500 02/05/18 0351  NA 142 143  144 146*  K 3.0* 3.1*  3.1* 3.2*  CL 116* 120*  121* 122*  CO2 20* 18*  19* 19*  GLUCOSE 88 107*  108* 89  BUN 28* 18  19 15   CREATININE 0.84 0.74  0.71 0.79  CALCIUM 7.4* 7.6*  7.5* 7.5*  PROT  --  4.5*  --   ALBUMIN 2.2* 2.2*  2.3* 2.1*  AST  --  29  --   ALT  --  32  --   ALKPHOS  --  39  --   BILITOT  --  0.7  --   GFRNONAA >60 >60  >60 >60  GFRAA >60 >60  >60 >60  ANIONGAP 6 5  4* 5     Hematology Recent Labs  Lab 02/04/18 0500 02/04/18 1421 02/04/18 2132 02/05/18 0351  WBC 13.7*  --  14.7* 11.7*  RBC 2.99*  --  3.16* 2.83*  HGB 7.7* 7.3* 8.4* 7.5*  HCT 24.4* 23.2* 26.7* 24.1*  MCV 81.6  --  84.5 85.2  MCH 25.8*  --  26.6 26.5  MCHC 31.6  --  31.5 31.1  RDW 16.1*  --  16.1* 16.0*  PLT 171  --  165 149*    Cardiac Enzymes Recent Labs  Lab 02/03/18 1345 02/03/18 2044  TROPONINI 0.09* 0.23*   No results for input(s): TROPIPOC in the last 168 hours.   BNPNo results for input(s): BNP, PROBNP in the last 168 hours.   DDimer No results for input(s): DDIMER in the last 168 hours.   Radiology    Dg Abd 1 View  Result Date: 02/04/2018 CLINICAL DATA:  Fecal impaction.  Abdominal distension. EXAM: ABDOMEN - 1 VIEW COMPARISON:  Abdominal radiograph 02/03/2018 FINDINGS: Persistent gaseous distended loop of bowel within the central abdomen. Gas within nondilated loops of small bowel. Peripheral calcified splenic mass left upper quadrant. No definite pneumatosis or free intraperitoneal air. Lumbar spine degenerative changes. IMPRESSION: Persistent marked gaseous distention of the colon. Electronically Signed   By: Annia Beltrew  Davis M.D.   On: 02/04/2018  10:57   Dg Chest Port 1 View  Result Date: 02/03/2018 CLINICAL DATA:  Central line placement EXAM: PORTABLE CHEST 1 VIEW COMPARISON:  01/30/2018 FINDINGS: Right central line is been placed with the tip in the upper SVC. No pneumothorax. Low lung volumes. Patchy right lung airspace opacities, improved since prior study. No focal opacity on the left. No effusions or acute bony abnormality. IMPRESSION: Right central line placement with the tip in the upper right atrium. No pneumothorax. Low lung volumes with patchy right lung airspace disease. Improved aeration on the left. Electronically Signed   By: Charlett NoseKevin  Dover M.D.   On: 02/03/2018 12:30   Dg Abd Portable 1v  Result Date: 02/05/2018 CLINICAL DATA:  78 year old female with abdominal distension. EXAM: PORTABLE ABDOMEN - 1 VIEW COMPARISON:  02/04/2018 and earlier, CT Abdomen and Pelvis 01/27/2018. FINDINGS: Portable AP supine view at 0420 hours. Benign calcified 5 centimeter splenic lesion redemonstrated in the left upper quadrant. Substantially decreased retained stool in the rectosigmoid colon since the CT and possible rectal catheter in place. Improved bowel gas pattern since yesterday, regressed gas distended sigmoid colon. Gas-filled bowel loops elsewhere remain somewhat featureless in appearance. Stable visualized osseous structures. Stable pelvic phleboliths. IMPRESSION: 1. Improved bowel gas pattern since yesterday with decompressed sigmoid colon. Substantially regressed stool in the rectosigmoid colon since December. 2. Remaining bowel loops appear somewhat featureless, but there is no evidence of mechanical bowel obstruction at this time. Electronically Signed   By: Odessa FlemingH  Hall M.D.   On: 02/05/2018 07:28   Dg Abd Portable 1v  Result Date: 02/03/2018 CLINICAL DATA:  Tachycardia EXAM: PORTABLE ABDOMEN - 1 VIEW COMPARISON:  02/02/2018 01/28/2018; CT abdomen pelvis-01/27/2018 FINDINGS: Similar-appearing marked distension of the colon without  significant interval change. No supine evidence of pneumoperitoneum. No pneumatosis or portal venous gas Peripherally calcified splenic mass overlies the left upper abdominal quadrant. Moderate severe multilevel lumbar spine. No acute osseous abnormalities. Multiple phleboliths overlie the lower pelvis bilaterally. IMPRESSION: Marked gaseous distention of the colon without interval change. No definite pneumoperitoneum, pneumatosis or portal venous gas. Electronically Signed   By: Simonne ComeJohn  Watts M.D.   On: 02/03/2018 09:15    Cardiac Studies   Echo 02/03/18 Study Conclusions  - Procedure narrative: Transthoracic echocardiography. Image   quality was poor. The study was technically difficult, as a  result of poor acoustic windows. - Left ventricle: The cavity size was mildly reduced. Systolic   function was normal. The estimated ejection fraction was in the   range of 55% to 60%. Probable mild hypokinesis of the   basal-midinferolateral myocardium. - Right ventricle: Systolic function was mildly reduced. - Pericardium, extracardiac: A small to moderate pericardial   effusion was identified. There was no evidence of hemodynamic   compromise.  Impressions:  - Very technically difficult study. Overall LV EF is preserved.   There appears to be mild hypokinesis of the inferolateral wall,   but views of this are limited. There is a small to moderate   pericardial effusion without any echo evidence of tamponade.   There appears to be echo dense material in the pericardial space   along the anterior surface of the heart, consider pericardial fat   vs. fibrinous material vs. clot.  Patient Profile     78 y.o. female with history of paroxysmal AF, Parkinson's, dementia, living in SNF who was admitted for fecal impaction. We were called 12/31 when she went into afib RVR, and ECG was concerning for posterior infarct. She has no complaints and denies any chest pain on interview, even when going 150  BPM.   Assessment & Plan    A fib with RVR, converted to NSR before amiodarone, but was in RVR for several hours.  --amiodarone 400 mg BID for 1 week then 200 mg daily -- High risk for bleeding and refused anticoagulation in past for this reason.  No IV Heparin. --keep K+ >4 and Mg+ >2.  Today K+ 3.2  And Mg+ 2.1 replacing per primary team --artifact that resembles VTach on monitor around 2200  Abnormal EKG and known CAD by cath 2013- non obstructive then,  EKG possible STEMI yesterday with rapid A fib, no chest pain.  Will recheck EKG --troponin slightly elevated pk of 0.23 with HR 140 in a fib most likely mild demand ischemia.   --no IV heparin due to acute anemia requiring transfusions. --Echo with normal EF, mild hypokinesis of the inferolateral wall, and smal- mod pericardiac effusion without hemodynamic compromise, with some adherent echo dense material on the anterior aspect of the heart.  Anemia with hgb to 7.5 -  hemoccult card +, has rec'd 1 unit PRBCs and another ordered.  Hypotension  To 73 systolic.  Currently 113 systolic.  Will decrease metoprolol to 25 mg BID now on amiodarone.    Parkinson's disease and dementia per IM      For questions or updates, please contact [facility] Please consult [email or web address] for contact info under        Signed, Nada Boozer, NP  02/05/2018, 7:47 AM

## 2018-02-05 NOTE — Consult Note (Addendum)
WOC Nurse wound consult note Reason for Consult:sacral and buttock wounds, one stage two and one partial thickness from MASD Wound type:pressure and MASD Pressure Injury POA: Yes Measurement: left medial, proximal buttock 1.2cm x 0.8cm x 0.1cm partial thickness from moisture. Coccyx area with 0.8cm x 0.3cm x 0.1cm healing stage 2. Wound bed: both 100% pink Drainage (amount, consistency, odor) no drainage or odor noted. Periwound: intact Dressing procedure/placement/frequency:MASD already addressed with Purewick and Flexiseal. Foam covering both wounds is already in place, continue. Pt to stay side to side, no supine lying except meals. Pt's nutritional status is very poor, recommend Nutritional Consult for optimal wound healing, please order if you agree. We will not follow, but will remain available to this patient, to nursing, and the medical and/or surgical teams. Please re-consult if we need to assist further.  Barnett HatterMelinda Breeley Bischof, RN-C, WTA-C, [facility] Wound Treatment Associate Ostomy Care Associate

## 2018-02-05 NOTE — Progress Notes (Signed)
Initial Nutrition Assessment  DOCUMENTATION CODES:   Obesity unspecified  INTERVENTION:  - Continue Ensure Enlive BID, each supplement provides 350 kcal and 20 grams of protein. - Will order Magic Cup BID with meals, each supplement provides 290 kcal and 9 grams of protein. - Will order daily multivitamin with minerals. - Tech/RN to provide feeding assistance.  - Continue to encourage PO intakes.    NUTRITION DIAGNOSIS:   Inadequate oral intake related to acute illness, lethargy/confusion, other (see comment)(needs feeding assistance) as evidenced by meal completion < 50%.  GOAL:   Patient will meet greater than or equal to 90% of their needs  MONITOR:   PO intake, Supplement acceptance, Weight trends, Labs, Skin  REASON FOR ASSESSMENT:   Consult Assessment of nutrition requirement/status  ASSESSMENT:   78 year old with past medical history significant for A. fib, CHF, CAD, Parkinson's disease, and history of CVA with R-sided weakness. She was admitted with abdominal distention, poor oral intake, and constipation. Patient was found to have severe abdominal distention and CT of the abdomen pelvis showed gaseous distention of the colon with very large stool burden identified within the rectum which favored a rectal impaction. Patient was found to have an AKI likely d/t her poor p.o. intake. The morning of 12/31 she developed A. fib with RVR and SOB.  Per chart review, patient consumed 50% of breakfast and 0% of lunch and dinner on 12/30. No other intakes documented since admission. Flow sheet indicates that patient is a/o to self only. No family/visitors present at the time of RD visit. Patient was unable to provide any nutrition-related hx.   RN was at bedside during part of RD visit and reported that this is her first day with patient. She reported that patient did not like breakfast, but that she Banker(RN) provided her with medications crushed in applesauce and patient ate nearly the  whole cup of applesauce.  Lunch tray was over patient's lap. She had ginger ale, which she was able to hold and drink without a straw. No coughing or spilling noted. She also had a bowl of soup and a cup of orange sherbet. She was able to self feed soup, although it took her a long time to take 2-3 spoonfuls. She seemed confused about how to self feed sherbet. RD fed patient sherbet and she tolerated it very well and stated she enjoys ice cream.   Suspect that even though patient is able to self feed with some items, she is unlikely to be able to adequately do so for a full meal. Highly recommend feeding assistance be provided to promote greater PO intakes.   Per chart review, current weight is 172 lb and weight has been stable throughout hospitalization. Prior to admission, no weight hx since 04/29/14 when patient weighed 150 ;b.   Medications reviewed; 40 mg IV Protonix BID, 500 mg KPhos Neurtral BID starting today, 30 mEq K-Dur x2 doses today, 1 packet Metamucil BID, 20 mmol IV NaPhos x1 run today and x1 run 1/1. Labs reviewed; CBG: 86 mg/dL today, Na: 604146 mmol/L, K: 3.2 mmol/L, Ca: 7.5 mg/dL, Phos: 1.7 mg/dL. IVF; D5 @ 75 mL/hr (306 kcal).     NUTRITION - FOCUSED PHYSICAL EXAM:  Completed; no muscle and no fat wasting, moderate edema to BLE noted.   Diet Order:   Diet Order            Diet full liquid Room service appropriate? Yes; Fluid consistency: Thin  Diet effective now  EDUCATION NEEDS:   Not appropriate for education at this time  Skin:  Skin Assessment: Skin Integrity Issues: Skin Integrity Issues:: Stage II Stage II: sacrum x2  Last BM:  1/2  Height:   Ht Readings from Last 1 Encounters:  02/03/18 5\' 2"  (1.575 m)    Weight:   Wt Readings from Last 1 Encounters:  02/05/18 77.9 kg    Ideal Body Weight:  50 kg  BMI:  Body mass index is 31.41 kg/m.  Estimated Nutritional Needs:   Kcal:  1405-1640 kcal  Protein:  75-90 grams  Fluid:   >/= 1.8 L/day     Trenton GammonJessica Tammra Pressman, MS, RD, LDN, [facility] Inpatient Clinical Dietitian Pager # [phone] After hours/weekend pager # [phone]

## 2018-02-05 NOTE — Progress Notes (Addendum)
Subjective: Patient seen and examined at bedside. Has a rectal tube draining liquid green stool. As per wound care nurse consult patient's nutritional status is very poor, with decreased oral intake. Patient denies abdominal pain, nausea or vomiting.  Objective: Vital signs in last 24 hours: Temp:  [97.3 F (36.3 C)-98.8 F (37.1 C)] 97.4 F (36.3 C) (01/02 0321) Pulse Rate:  [64-96] 67 (01/02 0600) Resp:  [10-29] 13 (01/02 0600) BP: (87-137)/(42-81) 107/48 (01/02 0600) SpO2:  [92 %-100 %] 100 % (01/02 0600) Weight:  [77.9 kg] 77.9 kg (01/02 0333) Weight change: 2.1 kg Last BM Date: 02/04/18  ZO:XWRUEAVPE:patient lying on bed,rectal tube draining liquid green stool GENERAL:acute distress ABDOMEN:soft, nondistended, nontender EXTREMITIES:no deformity  Lab Results: Results for orders placed or performed during the hospital encounter of 01/27/18 (from the past 48 hour(s))  Lactic acid, plasma     Status: None   Collection Time: 02/03/18  1:32 PM  Result Value Ref Range   Lactic Acid, Venous 1.9 0.5 - 1.9 mmol/L    Comment: Performed at [facility], [address] [place], [place] [address]  Troponin I - Now Then Q6H     Status: Abnormal   Collection Time: 02/03/18  1:45 PM  Result Value Ref Range   Troponin I 0.09 (HH) <0.03 ng/mL    Comment: CRITICAL RESULT CALLED TO, READ BACK BY AND VERIFIED WITH: B.GROGAN AT 1514 ON 02/03/18 BY N.THOMPSON Performed at [facility], [address] [place], [place] [address]   Troponin I - Now Then Q6H     Status: Abnormal   Collection Time: 02/03/18  8:44 PM  Result Value Ref Range   Troponin I 0.23 (HH) <0.03 ng/mL    Comment: CRITICAL VALUE NOTED.  VALUE IS CONSISTENT WITH PREVIOUSLY REPORTED AND CALLED VALUE. Performed at [facility], [address] [place], [place] [address]   CBC with Differential/Platelet     Status: Abnormal   Collection Time: 02/04/18  5:00 AM  Result Value  Ref Range   WBC 13.7 (H) 4.0 - 10.5 K/uL   RBC 2.99 (L) 3.87 - 5.11 MIL/uL   Hemoglobin 7.7 (L) 12.0 - 15.0 g/dL   HCT 21.324.4 (L) 08.636.0 - 57.846.0 %   MCV 81.6 80.0 - 100.0 fL   MCH 25.8 (L) 26.0 - 34.0 pg   MCHC 31.6 30.0 - 36.0 g/dL   RDW 46.916.1 (H) 62.911.5 - 52.815.5 %   Platelets 171 150 - 400 K/uL   nRBC 0.0 0.0 - 0.2 %   Neutrophils Relative % 72 %   Neutro Abs 10.0 (H) 1.7 - 7.7 K/uL   Lymphocytes Relative 14 %   Lymphs Abs 1.9 0.7 - 4.0 K/uL   Monocytes Relative 8 %   Monocytes Absolute 1.1 (H) 0.1 - 1.0 K/uL   Eosinophils Relative 4 %   Eosinophils Absolute 0.5 0.0 - 0.5 K/uL   Basophils Relative 0 %   Basophils Absolute 0.0 0.0 - 0.1 K/uL   Immature Granulocytes 2 %   Abs Immature Granulocytes 0.22 (H) 0.00 - 0.07 K/uL    Comment: Performed at [facility], [address] [place], [place] [address]  Renal function panel     Status: Abnormal   Collection Time: 02/04/18  5:00 AM  Result Value Ref Range   Sodium 143 135 - 145 mmol/L   Potassium 3.1 (L) 3.5 - 5.1 mmol/L   Chloride 120 (H) 98 - 111 mmol/L   CO2 18 (  L) 22 - 32 mmol/L   Glucose, Bld 107 (H) 70 - 99 mg/dL   BUN 18 8 - 23 mg/dL   Creatinine, Ser 4.19 0.44 - 1.00 mg/dL   Calcium 7.6 (L) 8.9 - 10.3 mg/dL   Phosphorus 1.6 (L) 2.5 - 4.6 mg/dL   Albumin 2.3 (L) 3.5 - 5.0 g/dL   GFR calc non Af Amer >60 >60 mL/min   GFR calc Af Amer >60 >60 mL/min   Anion gap 5 5 - 15    Comment: Performed at [facility], [address], [place], [place] [address]  Basic metabolic panel     Status: Abnormal   Collection Time: 02/04/18  5:00 AM  Result Value Ref Range   Sodium 144 135 - 145 mmol/L   Potassium 3.1 (L) 3.5 - 5.1 mmol/L   Chloride 121 (H) 98 - 111 mmol/L   CO2 19 (L) 22 - 32 mmol/L   Glucose, Bld 108 (H) 70 - 99 mg/dL   BUN 19 8 - 23 mg/dL   Creatinine, Ser 4.09 0.44 - 1.00 mg/dL   Calcium 7.5 (L) 8.9 - 10.3 mg/dL   GFR calc non Af Amer >60 >60 mL/min   GFR calc Af Amer >60 >60  mL/min   Anion gap 4 (L) 5 - 15    Comment: Performed at [facility], [address], [place], [place] [address]  Phosphorus     Status: Abnormal   Collection Time: 02/04/18  5:00 AM  Result Value Ref Range   Phosphorus 1.6 (L) 2.5 - 4.6 mg/dL    Comment: Performed at [facility], [address] [place], [place] [address]  Magnesium     Status: None   Collection Time: 02/04/18  5:00 AM  Result Value Ref Range   Magnesium 2.2 1.7 - 2.4 mg/dL    Comment: Performed at [facility], [address] [place], [place] [address]  Hepatic function panel     Status: Abnormal   Collection Time: 02/04/18  5:00 AM  Result Value Ref Range   Total Protein 4.5 (L) 6.5 - 8.1 g/dL   Albumin 2.2 (L) 3.5 - 5.0 g/dL   AST 29 15 - 41 U/L   ALT 32 0 - 44 U/L   Alkaline Phosphatase 39 38 - 126 U/L   Total Bilirubin 0.7 0.3 - 1.2 mg/dL   Bilirubin, Direct 0.1 0.0 - 0.2 mg/dL   Indirect Bilirubin 0.6 0.3 - 0.9 mg/dL    Comment: Performed at [facility], [address] [place], [place] [address]  Glucose, capillary     Status: None   Collection Time: 02/04/18  8:00 AM  Result Value Ref Range   Glucose-Capillary 93 70 - 99 mg/dL  Hemoglobin and hematocrit, blood     Status: Abnormal   Collection Time: 02/04/18  2:21 PM  Result Value Ref Range   Hemoglobin 7.3 (L) 12.0 - 15.0 g/dL   HCT 97.9 (L) 89.2 - 11.9 %    Comment: Performed at [facility], [address] [place], [place] [address]  Type and screen [facility]     Status: None (Preliminary result)   Collection Time: 02/04/18  4:32 PM  Result Value Ref Range   ABO/RH(D) O POS    Antibody Screen NEG    Sample Expiration 02/07/2018    Unit Number [id number]    Blood Component Type RBC LR PHER1  Unit division 00    Status of Unit ISSUED,FINAL    Transfusion Status OK TO TRANSFUSE    Crossmatch Result       Compatible Performed at [facility], [address] [place], [place] [address]    Unit Number [id number]    Blood Component Type RBC LR PHER2    Unit division 00    Status of Unit ALLOCATED    Transfusion Status OK TO TRANSFUSE    Crossmatch Result Compatible   Prepare RBC     Status: None   Collection Time: 02/04/18  4:32 PM  Result Value Ref Range   Order Confirmation      ORDER PROCESSED BY BLOOD BANK Performed at [facility], [address] [place], [place] [address]   ABO/Rh     Status: None   Collection Time: 02/04/18  4:32 PM  Result Value Ref Range   ABO/RH(D)      O POS Performed at [facility], [address] [place], [place] [address]   Occult blood card to lab, stool RN will collect Clovis Riley(Mitchell, RN)     Status: Abnormal   Collection Time: 02/04/18  8:45 PM  Result Value Ref Range   Fecal Occult Bld POSITIVE (A) NEGATIVE    Comment: Performed at [facility], [address] [place], [place] [address]  CBC     Status: Abnormal   Collection Time: 02/04/18  9:32 PM  Result Value Ref Range   WBC 14.7 (H) 4.0 - 10.5 K/uL   RBC 3.16 (L) 3.87 - 5.11 MIL/uL   Hemoglobin 8.4 (L) 12.0 - 15.0 g/dL   HCT 41.326.7 (L) 24.436.0 - 01.046.0 %   MCV 84.5 80.0 - 100.0 fL   MCH 26.6 26.0 - 34.0 pg   MCHC 31.5 30.0 - 36.0 g/dL   RDW 27.216.1 (H) 53.611.5 - 64.415.5 %   Platelets 165 150 - 400 K/uL   nRBC 0.0 0.0 - 0.2 %    Comment: Performed at [facility], [address] [place], [place] [address]  CBC with Differential/Platelet     Status: Abnormal   Collection Time: 02/05/18  3:51 AM  Result Value Ref Range   WBC 11.7 (H) 4.0 - 10.5 K/uL   RBC 2.83 (L) 3.87 - 5.11 MIL/uL   Hemoglobin 7.5 (L) 12.0 - 15.0 g/dL   HCT 25.924.1 (L) 56.336.0 - 87.546.0 %   MCV 85.2 80.0 - 100.0 fL   MCH 26.5 26.0 - 34.0 pg   MCHC 31.1 30.0 - 36.0 g/dL   RDW 64.316.0 (H) 32.911.5 - 51.815.5 %   Platelets 149 (L) 150 - 400 K/uL   nRBC  0.0 0.0 - 0.2 %   Neutrophils Relative % 69 %   Neutro Abs 8.2 (H) 1.7 - 7.7 K/uL   Lymphocytes Relative 16 %   Lymphs Abs 1.9 0.7 - 4.0 K/uL   Monocytes Relative 8 %   Monocytes Absolute 0.9 0.1 - 1.0 K/uL   Eosinophils Relative 5 %   Eosinophils Absolute 0.6 (H) 0.0 - 0.5 K/uL   Basophils Relative 0 %   Basophils Absolute 0.0 0.0 - 0.1 K/uL   Immature Granulocytes 2 %   Abs Immature Granulocytes 0.20 (H) 0.00 - 0.07 K/uL    Comment: Performed at [facility], [address] [place], [place] [address]  Renal function panel     Status: Abnormal   Collection Time: 02/05/18  3:51 AM  Result Value Ref Range   Sodium 146 (H) 135 - 145 mmol/L   Potassium 3.2 (L) 3.5 - 5.1 mmol/L   Chloride 122 (H) 98 - 111 mmol/L   CO2 19 (L) 22 - 32 mmol/L   Glucose, Bld 89 70 - 99 mg/dL   BUN 15 8 - 23 mg/dL   Creatinine, Ser 4.09 0.44 - 1.00 mg/dL   Calcium 7.5 (L) 8.9 - 10.3 mg/dL   Phosphorus 1.7 (L) 2.5 - 4.6 mg/dL   Albumin 2.1 (L) 3.5 - 5.0 g/dL   GFR calc non Af Amer >60 >60 mL/min   GFR calc Af Amer >60 >60 mL/min   Anion gap 5 5 - 15    Comment: Performed at [facility], [address], [place], [place] [address]  Magnesium     Status: None   Collection Time: 02/05/18  3:51 AM  Result Value Ref Range   Magnesium 2.1 1.7 - 2.4 mg/dL    Comment: Performed at [facility], [address], [place], [place] [address]  Prepare RBC     Status: None   Collection Time: 02/05/18  6:18 AM  Result Value Ref Range   Order Confirmation      ORDER PROCESSED BY BLOOD BANK Performed at [facility], [address] [place], [place] [address]   Glucose, capillary     Status: None   Collection Time: 02/05/18  8:24 AM  Result Value Ref Range   Glucose-Capillary 86 70 - 99 mg/dL    Studies/Results: Dg Abd 1 View  Result Date: 02/04/2018 CLINICAL DATA:  Fecal impaction.  Abdominal distension. EXAM: ABDOMEN - 1 VIEW  COMPARISON:  Abdominal radiograph 02/03/2018 FINDINGS: Persistent gaseous distended loop of bowel within the central abdomen. Gas within nondilated loops of small bowel. Peripheral calcified splenic mass left upper quadrant. No definite pneumatosis or free intraperitoneal air. Lumbar spine degenerative changes. IMPRESSION: Persistent marked gaseous distention of the colon. Electronically Signed   By: Annia Belt M.D.   On: 02/04/2018 10:57   Dg Chest Port 1 View  Result Date: 02/03/2018 CLINICAL DATA:  Central line placement EXAM: PORTABLE CHEST 1 VIEW COMPARISON:  01/30/2018 FINDINGS: Right central line is been placed with the tip in the upper SVC. No pneumothorax. Low lung volumes. Patchy right lung airspace opacities, improved since prior study. No focal opacity on the left. No effusions or acute bony abnormality. IMPRESSION: Right central line placement with the tip in the upper right atrium. No pneumothorax. Low lung volumes with patchy right lung airspace disease. Improved aeration on the left. Electronically Signed   By: Charlett Nose M.D.   On: 02/03/2018 12:30   Dg Abd Portable 1v  Result Date: 02/05/2018 CLINICAL DATA:  78 year old female with abdominal distension. EXAM: PORTABLE ABDOMEN - 1 VIEW COMPARISON:  02/04/2018 and earlier, CT Abdomen and Pelvis 01/27/2018. FINDINGS: Portable AP supine view at 0420 hours. Benign calcified 5 centimeter splenic lesion redemonstrated in the left upper quadrant. Substantially decreased retained stool in the rectosigmoid colon since the CT and possible rectal catheter in place. Improved bowel gas pattern since yesterday, regressed gas distended sigmoid colon. Gas-filled bowel loops elsewhere remain somewhat featureless in appearance. Stable visualized osseous structures. Stable pelvic phleboliths. IMPRESSION: 1. Improved bowel gas pattern since yesterday with decompressed sigmoid colon. Substantially regressed stool in the rectosigmoid colon since December. 2.  Remaining bowel loops appear somewhat featureless, but there is no evidence of mechanical bowel obstruction at this time. Electronically  Signed   By: Odessa Fleming M.D.   On: 02/05/2018 07:28   Dg Abd Portable 1v  Result Date: 02/03/2018 CLINICAL DATA:  Tachycardia EXAM: PORTABLE ABDOMEN - 1 VIEW COMPARISON:  02/02/2018 01/28/2018; CT abdomen pelvis-01/27/2018 FINDINGS: Similar-appearing marked distension of the colon without significant interval change. No supine evidence of pneumoperitoneum. No pneumatosis or portal venous gas Peripherally calcified splenic mass overlies the left upper abdominal quadrant. Moderate severe multilevel lumbar spine. No acute osseous abnormalities. Multiple phleboliths overlie the lower pelvis bilaterally. IMPRESSION: Marked gaseous distention of the colon without interval change. No definite pneumoperitoneum, pneumatosis or portal venous gas. Electronically Signed   By: Simonne Come M.D.   On: 02/03/2018 09:15    Medications: I have reviewed the patient's current medications.  Assessment: Constipation, colonic ileus-marked improvement Abdominal x-ray from today shows improved bowel gas pattern and we decompress sigmoid colon, essentially regressed stool in rectosigmoid with no evidence of mechanical bowel obstruction  Plan: Advance diet to regular Keep patient on bulk forming laxatives such as Metamucil/Benefiber twice a day. Recommend Dulcolax oral/suppository if needed. Although the patient's hemoglobin has dropped from 8.4-7.5, BUN/creatinine ratio is normal, and stool is liquid green with no evidence of melena or hematochezia,this is likely hemodilution as patient has a positive balance of 20 L since admission. GI will sign off, please recall if needed Recommend nutritional consult, ok to discontinue Rectal tube.    Kerin Salen 02/05/2018, 8:36 AM   Pager [phone] If no answer or after 5 PM call [phone]

## 2018-02-05 NOTE — Progress Notes (Signed)
PROGRESS NOTE    [name]  QPY:[id number] DOB: Oct 20, 1940 DOA: 01/27/2018 PCP: [name] Eaton, MD    Brief Narrative ; 78 year old with past medical history significant for A. fib, chronic diastolic heart failure, coronary artery disease, Parkinson disease, history of CVA with right-sided weakness who was admitted with abdominal distention, poor oral intake and constipation. Patient was found to have severe abdominal distention and CT of the abdomen pelvis showed gaseous distention of the colon the very large stool burden identified within the rectum which favored a rectal impaction. Patient was found to have an AKI likely due to her poor p.o. intake.  GI was consulted for fecal impaction and colonic distention.  GI have been helping with management, GoLYTELY was ordered.  Unclear if patient has received or able to tolerate the GoLYTELY.  Patient has received multiple.  Patient was treated for hypernatremia and AKI.  The morning of 12-31, patient developed A. fib with RVR, shortness of breath.  EKG with ST depression.  Patient was also hypotensive with blood pressure in the 70s.  She received 1 L of IV bolus.  When her blood pressure improved she received a low dose metoprolol.  Cardiology was consulted.  Amiodarone was order.   Assessment & Plan:   Active Problems:   Essential hypertension   Coronary atherosclerosis   Atrial fibrillation (HCC)   Diastolic CHF (HCC)   Ambulatory dysfunction   Parkinson disease (HCC)   Constipation   Dementia due to Parkinson's disease with behavioral disturbance (HCC)   Hypokalemia   AKI (acute kidney injury) (HCC)   Leukocytosis   Acute on chronic respiratory failure with hypoxia (HCC)   Hyperbilirubinemia   Abdominal distention   Difficult intravenous access   Pressure injury of skin   1-A. fib with RVR, Hypotension;  Patient received IV fluids.  Blood pressure increased to the 100 range.  IV metoprolol PRN was ordered. Cardiology  was consulted, patient was a started on amiodarone. Patient subsequently sinus convert on her own before initiation of amiodarone. Hb trending down. Hold on start anticoagulation  Might be at high risk for bleeding.  Appreciate cardiology help/  -ECHO with preserve EF, hypokinesis of mid-inferiorlateral  wall myocardia. No cath planned.   2-Abdominal distention; in the setting of severe constipation and fecal impaction GI consulted and following Patient receive smog enema without any improvement. Per GI.  KUB improved.  Resolving.    Anemia; acute blood loss anemia;  Dark stool. Start IV Protonix.  Occult blood positive.  GI doesn't think patient is having acute GI bleed. Will continue to monitor. Received 2 unit PRBC today.  Hb this am at 7. Post transfusion yesterday.  Repeat labs in am.   3-hypokalemia hypophosphatemia; Replete IV and oral.   AKI; creatinine on admission at 3.1 Improved with IV fluids.  Pneumonia, leukocytosis; Question of possible aspiration. Received 10 days of antibiotics.   Acute hypoxic respiratory failure Suspect related to aspiration. Continue with oxygen supplement..  Dementia Parkinson disease Delirium precaution. On quetiapine.   Hypertension hold benazepril.             Estimated body mass index is 31.41 kg/m as calculated from the following:   Height as of this encounter: 5\' 2"  (1.575 m).   Weight as of this encounter: 77.9 kg.   DVT prophylaxis: heparin  Code Status: DNR Family Communication: no family at bedside.  Disposition Plan: remain in the step down unit.    Consultants:   Cardiology  GI  Procedures:  none  Antimicrobials Zosyn   Subjective: She was confuse earlier today.  Denies abdominal pain.   Objective: Vitals:   02/05/18 0333 02/05/18 0400 02/05/18 0500 02/05/18 0600  BP:  (!) 103/45 (!) 104/46 (!) 107/48  Pulse:  64 73 67  Resp:  12 13 13   Temp:      TempSrc:      SpO2:  100% 100%  100%  Weight: 77.9 kg     Height:        Intake/Output Summary (Last 24 hours) at 02/05/2018 0715 Last data filed at 02/05/2018 0546 Gross per 24 hour  Intake 3437.04 ml  Output 2000 ml  Net 1437.04 ml   Filed Weights   02/03/18 0805 02/04/18 0500 02/05/18 0333  Weight: 75.8 kg 78.2 kg 77.9 kg    Examination:  General exam: NAD Respiratory system: CTA Cardiovascular system: S 1, S 2 RRR Gastrointestinal system: BS present, soft, nt Central nervous system: Alert, confuse Extremities: Symmetric power.  Skin: no rashes.      Data Reviewed: I have personally reviewed following labs and imaging studies  CBC: Recent Labs  Lab 01/31/18 0548 02/02/18 0432 02/03/18 0528 02/04/18 0500 02/04/18 1421 02/04/18 2132 02/05/18 0351  WBC 11.8* 19.8* 13.6* 13.7*  --  14.7* 11.7*  NEUTROABS 8.0* 15.7* 8.8* 10.0*  --   --  8.2*  HGB 12.2 11.3* 9.6* 7.7* 7.3* 8.4* 7.5*  HCT 38.1 35.6* 30.5* 24.4* 23.2* 26.7* 24.1*  MCV 79.7* 80.4 81.3 81.6  --  84.5 85.2  PLT 174 199 183 171  --  165 149*   Basic Metabolic Panel: Recent Labs  Lab 01/31/18 0548  02/01/18 0534 02/02/18 0432 02/02/18 1904 02/03/18 0528 02/03/18 0529 02/04/18 0500 02/05/18 0351  NA 148*   < > 141 142  --   --  142 143  144 146*  K 2.7*   < > 3.7 2.5* 3.5  --  3.0* 3.1*  3.1* 3.2*  CL 119*   < > 115* 114*  --   --  116* 120*  121* 122*  CO2 21*   < > 20* 21*  --   --  20* 18*  19* 19*  GLUCOSE 112*   < > 116* 126*  --   --  88 107*  108* 89  BUN 12   < > 11 23  --   --  28* 18  19 15   CREATININE 1.02*   < > 0.87 0.83  --   --  0.84 0.74  0.71 0.79  CALCIUM 8.2*   < > 7.8* 7.7*  --   --  7.4* 7.6*  7.5* 7.5*  MG 2.1  --   --  2.3  --  2.0  --  2.2 2.1  PHOS <0.1*  --  <1.0* 2.0*  --   --  1.6* 1.6*  1.6* 1.7*   < > = values in this interval not displayed.   GFR: Estimated Creatinine Clearance: 55.1 mL/min (by C-G formula based on SCr of 0.79 mg/dL). Liver Function Tests: Recent Labs  Lab  02/01/18 0534 02/02/18 0432 02/03/18 0529 02/04/18 0500 02/05/18 0351  AST  --   --   --  29  --   ALT  --   --   --  32  --   ALKPHOS  --   --   --  39  --   BILITOT  --   --   --  0.7  --  PROT  --   --   --  4.5*  --   ALBUMIN 2.6* 2.4* 2.2* 2.2*  2.3* 2.1*   No results for input(s): LIPASE, AMYLASE in the last 168 hours. No results for input(s): AMMONIA in the last 168 hours. Coagulation Profile: No results for input(s): INR, PROTIME in the last 168 hours. Cardiac Enzymes: Recent Labs  Lab 02/03/18 1345 02/03/18 2044  TROPONINI 0.09* 0.23*   BNP (last 3 results) No results for input(s): PROBNP in the last 8760 hours. HbA1C: No results for input(s): HGBA1C in the last 72 hours. CBG: Recent Labs  Lab 01/30/18 0736 02/01/18 0757 02/02/18 0735 02/03/18 0811 02/04/18 0800  GLUCAP 108* 111* 109* 88 93   Lipid Profile: No results for input(s): CHOL, HDL, LDLCALC, TRIG, CHOLHDL, LDLDIRECT in the last 72 hours. Thyroid Function Tests: No results for input(s): TSH, T4TOTAL, FREET4, T3FREE, THYROIDAB in the last 72 hours. Anemia Panel: No results for input(s): VITAMINB12, FOLATE, FERRITIN, TIBC, IRON, RETICCTPCT in the last 72 hours. Sepsis Labs: Recent Labs  Lab 02/02/18 1227 02/03/18 1332  PROCALCITON 2.24  --   LATICACIDVEN  --  1.9    Recent Results (from the past 240 hour(s))  Culture, Urine     Status: None   Collection Time: 01/27/18  5:23 PM  Result Value Ref Range Status   Specimen Description   Final    URINE, RANDOM Performed at [facility], [address] [place], [place] [address]    Special Requests   Final    NONE Performed at [facility], [address] [place], [place] [address]    Culture   Final    Multiple bacterial morphotypes present, none predominant. Suggest appropriate recollection if clinically indicated.   Report Status 01/29/2018 FINAL  Final  MRSA PCR Screening     Status: None    Collection Time: 01/29/18  2:13 AM  Result Value Ref Range Status   MRSA by PCR NEGATIVE NEGATIVE Final    Comment:        The GeneXpert MRSA Assay (FDA approved for NASAL specimens only), is one component of a comprehensive MRSA colonization surveillance program. It is not intended to diagnose MRSA infection nor to guide or monitor treatment for MRSA infections. Performed at [facility], [address] [place], [place] [address]          Radiology Studies: Dg Abd 1 View  Result Date: 02/04/2018 CLINICAL DATA:  Fecal impaction.  Abdominal distension. EXAM: ABDOMEN - 1 VIEW COMPARISON:  Abdominal radiograph 02/03/2018 FINDINGS: Persistent gaseous distended loop of bowel within the central abdomen. Gas within nondilated loops of small bowel. Peripheral calcified splenic mass left upper quadrant. No definite pneumatosis or free intraperitoneal air. Lumbar spine degenerative changes. IMPRESSION: Persistent marked gaseous distention of the colon. Electronically Signed   By: Annia Beltrew  Davis M.D.   On: 02/04/2018 10:57   Dg Chest Port 1 View  Result Date: 02/03/2018 CLINICAL DATA:  Central line placement EXAM: PORTABLE CHEST 1 VIEW COMPARISON:  01/30/2018 FINDINGS: Right central line is been placed with the tip in the upper SVC. No pneumothorax. Low lung volumes. Patchy right lung airspace opacities, improved since prior study. No focal opacity on the left. No effusions or acute bony abnormality. IMPRESSION: Right central line placement with the tip in the upper right atrium. No pneumothorax. Low lung volumes with patchy right lung airspace disease. Improved aeration on the left. Electronically Signed   By: Caryn BeeKevin  Dover M.D.   On: 02/03/2018 12:30   Dg Abd Portable 1v  Result Date: 02/03/2018 CLINICAL DATA:  Tachycardia EXAM: PORTABLE ABDOMEN - 1 VIEW COMPARISON:  02/02/2018 01/28/2018; CT abdomen pelvis-01/27/2018 FINDINGS: Similar-appearing marked distension of the colon  without significant interval change. No supine evidence of pneumoperitoneum. No pneumatosis or portal venous gas Peripherally calcified splenic mass overlies the left upper abdominal quadrant. Moderate severe multilevel lumbar spine. No acute osseous abnormalities. Multiple phleboliths overlie the lower pelvis bilaterally. IMPRESSION: Marked gaseous distention of the colon without interval change. No definite pneumoperitoneum, pneumatosis or portal venous gas. Electronically Signed   By: Simonne ComeJohn  Watts M.D.   On: 02/03/2018 09:15        Scheduled Meds: . sodium chloride   Intravenous Once  . amiodarone  400 mg Oral BID  . aspirin EC  81 mg Oral Daily  . Chlorhexidine Gluconate Cloth  6 each Topical Daily  . metoprolol tartrate  50 mg Oral BID  . pantoprazole (PROTONIX) IV  40 mg Intravenous Q12H  . phosphorus  500 mg Oral BID  . potassium chloride  30 mEq Oral Q4H  . sodium chloride flush  10-40 mL Intracatheter Q12H  . [START ON 02/18/2018] Vitamin D (Ergocalciferol)  50,000 Units Oral Q30 days   Continuous Infusions: . dextrose    . piperacillin-tazobactam (ZOSYN)  IV 3.375 g (02/05/18 0537)  . sodium phosphate  Dextrose 5% IVPB       LOS: 4 days    Time spent: 35 minutes    Alba CoryBelkys A Jasan Doughtie, MD [facility] Pager [phone]  If 7PM-7AM, please contact night-coverage [email or web address] Password [email or web address] 02/05/2018, 7:15 AM

## 2018-02-06 LAB — BPAM RBC
Blood Product Expiration Date: 202001262359
Blood Product Expiration Date: 202001282359
ISSUE DATE / TIME: 202001011750
ISSUE DATE / TIME: 202001020917
Unit Type and Rh: 5100
Unit Type and Rh: 5100

## 2018-02-06 LAB — CBC
HCT: 25.7 % — ABNORMAL LOW (ref 36.0–46.0)
Hemoglobin: 8.1 g/dL — ABNORMAL LOW (ref 12.0–15.0)
MCH: 25.9 pg — ABNORMAL LOW (ref 26.0–34.0)
MCHC: 31.5 g/dL (ref 30.0–36.0)
MCV: 82.1 fL (ref 80.0–100.0)
Platelets: 159 10*3/uL (ref 150–400)
RBC: 3.13 MIL/uL — ABNORMAL LOW (ref 3.87–5.11)
RDW: 16.6 % — ABNORMAL HIGH (ref 11.5–15.5)
WBC: 12.4 10*3/uL — ABNORMAL HIGH (ref 4.0–10.5)
nRBC: 0.2 % (ref 0.0–0.2)

## 2018-02-06 LAB — TYPE AND SCREEN
ABO/RH(D): O POS
Antibody Screen: NEGATIVE
Unit division: 0
Unit division: 0

## 2018-02-06 LAB — BASIC METABOLIC PANEL
Anion gap: 6 (ref 5–15)
BUN: 12 mg/dL (ref 8–23)
CO2: 19 mmol/L — ABNORMAL LOW (ref 22–32)
Calcium: 7.5 mg/dL — ABNORMAL LOW (ref 8.9–10.3)
Chloride: 116 mmol/L — ABNORMAL HIGH (ref 98–111)
Creatinine, Ser: 0.73 mg/dL (ref 0.44–1.00)
GFR calc Af Amer: >60 mL/min (ref >60)
GFR calc non Af Amer: >60 mL/min (ref >60)
Glucose, Bld: 86 mg/dL (ref 70–99)
Potassium: 2.5 mmol/L — CL (ref 3.5–5.1)
Sodium: 141 mmol/L (ref 135–145)

## 2018-02-06 LAB — PHOSPHORUS: Phosphorus: 2.8 mg/dL (ref 2.5–4.6)

## 2018-02-06 LAB — GLUCOSE, CAPILLARY: Glucose-Capillary: 87 mg/dL (ref 70–99)

## 2018-02-06 LAB — MAGNESIUM: Magnesium: 1.9 mg/dL (ref 1.7–2.4)

## 2018-02-06 MED ORDER — POTASSIUM CHLORIDE CRYS ER 20 MEQ PO TBCR
40.0000 meq | EXTENDED_RELEASE_TABLET | ORAL | Status: AC
  Administered 2018-02-06 (×2): 40 meq via ORAL
  Filled 2018-02-06 (×2): qty 2

## 2018-02-06 MED ORDER — POTASSIUM CHLORIDE 10 MEQ/100ML IV SOLN
10.0000 meq | INTRAVENOUS | Status: AC
  Administered 2018-02-06 (×6): 10 meq via INTRAVENOUS
  Filled 2018-02-06 (×6): qty 100

## 2018-02-06 NOTE — Progress Notes (Addendum)
PROGRESS NOTE    [name]  ZOX:[id number] DOB: 10-Apr-1940 DOA: 01/27/2018 PCP: Nadara Eaton, MD    Brief Narrative ; 78 year old with past medical history significant for A. fib, chronic diastolic heart failure, coronary artery disease, Parkinson disease, history of CVA with right-sided weakness who was admitted with abdominal distention, poor oral intake and constipation. Patient was found to have severe abdominal distention and CT of the abdomen pelvis showed gaseous distention of the colon the very large stool burden identified within the rectum which favored a rectal impaction. Patient was found to have an AKI likely due to her poor p.o. intake.  GI was consulted for fecal impaction and colonic distention.  GI have been helping with management, GoLYTELY was ordered.  Unclear if patient has received or able to tolerate the GoLYTELY.  Patient has received multiple.  Patient was treated for hypernatremia and AKI.  The morning of 12-31, patient developed A. fib with RVR, shortness of breath.  EKG with ST depression.  Patient was also hypotensive with blood pressure in the 70s.  She received 1 L of IV bolus.  When her blood pressure improved she received a low dose metoprolol.  Cardiology was consulted.  Amiodarone was order.   Assessment & Plan:   Active Problems:   Essential hypertension   Coronary atherosclerosis   Atrial fibrillation (HCC)   Diastolic CHF (HCC)   Ambulatory dysfunction   Parkinson disease (HCC)   Constipation   Dementia due to Parkinson's disease with behavioral disturbance (HCC)   Hypokalemia   AKI (acute kidney injury) (HCC)   Leukocytosis   Acute on chronic respiratory failure with hypoxia (HCC)   Hyperbilirubinemia   Abdominal distention   Difficult intravenous access   Pressure injury of skin   1-A. fib with RVR, Hypotension;  Patient received IV fluids.  Blood pressure increased to the 100 range.  IV metoprolol PRN was ordered. Cardiology  was consulted, patient was a started on amiodarone. Patient subsequently sinus convert on her own before initiation of amiodarone. Hb trending down. Hold on start anticoagulation  Might be at high risk for bleeding.  Appreciate cardiology help/  -ECHO with preserve EF, hypokinesis of mid-inferiorlateral  wall myocardia. No cath planned.  Rate controlled.  Needs amiodarone 400 mg BID for 5 more days, until 02-10-2018 then 200 mg daily.    2-Abdominal distention; in the setting of severe constipation and fecal impaction GI consulted and following Patient receive smog enema without any improvement. Per GI.  KUB improved.  Resolved.   3-Anemia; acute blood loss anemia;  Dark stool. Start IV Protonix.  Occult blood positive.  GI doesn't think patient is having acute GI bleed. Will continue to monitor. Received 2 unit PRBC. Marland Kitchen  Hb at 8 today, stable. Monitor.  Will check anemia panel.    4-hypokalemia hypophosphatemia; Replete IV and oral.   5-AKI; creatinine on admission at 3.1 Improved with IV fluids.  6-Pneumonia, leukocytosis; Question of possible aspiration. Received 10 days of antibiotics.   7-Acute hypoxic respiratory failure Suspect related to aspiration. Continue with oxygen supplement.Marland Kitchen  8-Dementia Parkinson disease Delirium precaution. On quetiapine.  Leukocytosis; monitor.   Diarrhea; decreasing.  Improving.    Nutrition Problem: Inadequate oral intake Etiology: acute illness, lethargy/confusion, other (see comment)(needs feeding assistance) Hypertension hold benazepril.     Signs/Symptoms: meal completion < 50%    Interventions: Ensure Enlive (each supplement provides 350kcal and 20 grams of protein), Magic cup, MVI  Estimated body mass index is 30.97 kg/m  as calculated from the following:   Height as of this encounter: 5\' 2"  (1.575 m).   Weight as of this encounter: 76.8 kg.   DVT prophylaxis: heparin  Code Status: DNR Family Communication: no  family at bedside.  Disposition Plan: remain in the step down unit.    Consultants:   Cardiology  GI   Procedures:  none  Antimicrobials Zosyn   Subjective: Denies abdominal pain. Diarrhea improving.   Objective: Vitals:   02/06/18 0500 02/06/18 0600 02/06/18 0700 02/06/18 0800  BP: 127/65 106/68 (!) 122/44   Pulse: 76 89 77   Resp: 15 11 16    Temp:    98.4 F (36.9 C)  TempSrc:    Oral  SpO2: 100% 99% 100%   Weight: 76.8 kg     Height:        Intake/Output Summary (Last 24 hours) at 02/06/2018 0820 Last data filed at 02/06/2018 [id number] Gross per 24 hour  Intake 1677.49 ml  Output 2650 ml  Net -972.51 ml   Filed Weights   02/04/18 0500 02/05/18 0333 02/06/18 0500  Weight: 78.2 kg 77.9 kg 76.8 kg    Examination:  General exam: NAD Respiratory system: CTA Cardiovascular system: S 1, S 2 IRR Gastrointestinal system: BS present soft, nt Central nervous system: Alert, confuse.  Extremities: Symmetric power.  Skin: no rashes.      Data Reviewed: I have personally reviewed following labs and imaging studies  CBC: Recent Labs  Lab 01/31/18 0548 02/02/18 0432 02/03/18 0528 02/04/18 0500 02/04/18 1421 02/04/18 2132 02/05/18 0351 02/06/18 0500  WBC 11.8* 19.8* 13.6* 13.7*  --  14.7* 11.7* 12.4*  NEUTROABS 8.0* 15.7* 8.8* 10.0*  --   --  8.2*  --   HGB 12.2 11.3* 9.6* 7.7* 7.3* 8.4* 7.5* 8.1*  HCT 38.1 35.6* 30.5* 24.4* 23.2* 26.7* 24.1* 25.7*  MCV 79.7* 80.4 81.3 81.6  --  84.5 85.2 82.1  PLT 174 199 183 171  --  165 149* 159   Basic Metabolic Panel: Recent Labs  Lab 02/02/18 0432 02/02/18 1904 02/03/18 0528 02/03/18 0529 02/04/18 0500 02/05/18 0351 02/06/18 0500  NA 142  --   --  142 143  144 146* 141  K 2.5* 3.5  --  3.0* 3.1*  3.1* 3.2* 2.5*  CL 114*  --   --  116* 120*  121* 122* 116*  CO2 21*  --   --  20* 18*  19* 19* 19*  GLUCOSE 126*  --   --  88 107*  108* 89 86  BUN 23  --   --  28* 18  19 15 12   CREATININE 0.83  --   --   0.84 0.74  0.71 0.79 0.73  CALCIUM 7.7*  --   --  7.4* 7.6*  7.5* 7.5* 7.5*  MG 2.3  --  2.0  --  2.2 2.1 1.9  PHOS 2.0*  --   --  1.6* 1.6*  1.6* 1.7* 2.8   GFR: Estimated Creatinine Clearance: 54.7 mL/min (by C-G formula based on SCr of 0.73 mg/dL). Liver Function Tests: Recent Labs  Lab 02/01/18 0534 02/02/18 0432 02/03/18 0529 02/04/18 0500 02/05/18 0351  AST  --   --   --  29  --   ALT  --   --   --  32  --   ALKPHOS  --   --   --  39  --   BILITOT  --   --   --  0.7  --   PROT  --   --   --  4.5*  --   ALBUMIN 2.6* 2.4* 2.2* 2.2*  2.3* 2.1*   No results for input(s): LIPASE, AMYLASE in the last 168 hours. No results for input(s): AMMONIA in the last 168 hours. Coagulation Profile: No results for input(s): INR, PROTIME in the last 168 hours. Cardiac Enzymes: Recent Labs  Lab 02/03/18 1345 02/03/18 2044  TROPONINI 0.09* 0.23*   BNP (last 3 results) No results for input(s): PROBNP in the last 8760 hours. HbA1C: No results for input(s): HGBA1C in the last 72 hours. CBG: Recent Labs  Lab 02/02/18 0735 02/03/18 0811 02/04/18 0800 02/05/18 0824 02/06/18 0734  GLUCAP 109* 88 93 86 87   Lipid Profile: No results for input(s): CHOL, HDL, LDLCALC, TRIG, CHOLHDL, LDLDIRECT in the last 72 hours. Thyroid Function Tests: No results for input(s): TSH, T4TOTAL, FREET4, T3FREE, THYROIDAB in the last 72 hours. Anemia Panel: No results for input(s): VITAMINB12, FOLATE, FERRITIN, TIBC, IRON, RETICCTPCT in the last 72 hours. Sepsis Labs: Recent Labs  Lab 02/02/18 1227 02/03/18 1332  PROCALCITON 2.24  --   LATICACIDVEN  --  1.9    Recent Results (from the past 240 hour(s))  Culture, Urine     Status: None   Collection Time: 01/27/18  5:23 PM  Result Value Ref Range Status   Specimen Description   Final    URINE, RANDOM Performed at [facility], [address] [place], [place] [address]    Special Requests   Final    NONE Performed at  [facility], [address] [place], [place] [address]    Culture   Final    Multiple bacterial morphotypes present, none predominant. Suggest appropriate recollection if clinically indicated.   Report Status 01/29/2018 FINAL  Final  MRSA PCR Screening     Status: None   Collection Time: 01/29/18  2:13 AM  Result Value Ref Range Status   MRSA by PCR NEGATIVE NEGATIVE Final    Comment:        The GeneXpert MRSA Assay (FDA approved for NASAL specimens only), is one component of a comprehensive MRSA colonization surveillance program. It is not intended to diagnose MRSA infection nor to guide or monitor treatment for MRSA infections. Performed at [facility], [address] [place], [place] [address]          Radiology Studies: Dg Abd 1 View  Result Date: 02/04/2018 CLINICAL DATA:  Fecal impaction.  Abdominal distension. EXAM: ABDOMEN - 1 VIEW COMPARISON:  Abdominal radiograph 02/03/2018 FINDINGS: Persistent gaseous distended loop of bowel within the central abdomen. Gas within nondilated loops of small bowel. Peripheral calcified splenic mass left upper quadrant. No definite pneumatosis or free intraperitoneal air. Lumbar spine degenerative changes. IMPRESSION: Persistent marked gaseous distention of the colon. Electronically Signed   By: Annia Belt M.D.   On: 02/04/2018 10:57   Dg Abd Portable 1v  Result Date: 02/05/2018 CLINICAL DATA:  78 year old female with abdominal distension. EXAM: PORTABLE ABDOMEN - 1 VIEW COMPARISON:  02/04/2018 and earlier, CT Abdomen and Pelvis 01/27/2018. FINDINGS: Portable AP supine view at 0420 hours. Benign calcified 5 centimeter splenic lesion redemonstrated in the left upper quadrant. Substantially decreased retained stool in the rectosigmoid colon since the CT and possible rectal catheter in place. Improved bowel gas pattern since yesterday, regressed gas distended sigmoid colon. Gas-filled bowel loops  elsewhere remain somewhat featureless in appearance. Stable visualized osseous structures.  Stable pelvic phleboliths. IMPRESSION: 1. Improved bowel gas pattern since yesterday with decompressed sigmoid colon. Substantially regressed stool in the rectosigmoid colon since December. 2. Remaining bowel loops appear somewhat featureless, but there is no evidence of mechanical bowel obstruction at this time. Electronically Signed   By: Odessa FlemingH  Hall M.D.   On: 02/05/2018 07:28        Scheduled Meds: . sodium chloride   Intravenous Once  . amiodarone  400 mg Oral BID  . aspirin EC  81 mg Oral Daily  . atorvastatin  40 mg Oral q1800  . Chlorhexidine Gluconate Cloth  6 each Topical Daily  . feeding supplement (ENSURE ENLIVE)  237 mL Oral BID BM  . metoprolol tartrate  25 mg Oral BID  . multivitamin with minerals  1 tablet Oral Daily  . pantoprazole (PROTONIX) IV  40 mg Intravenous Q12H  . phosphorus  500 mg Oral BID  . potassium chloride  40 mEq Oral Q4H  . sodium chloride flush  10-40 mL Intracatheter Q12H  . [START ON 02/18/2018] Vitamin D (Ergocalciferol)  50,000 Units Oral Q30 days   Continuous Infusions: . dextrose 50 mL/hr at 02/06/18 0802  . potassium chloride 10 mEq (02/06/18 0805)     LOS: 5 days    Time spent: 35 minutes    [name] CoryBelkys A Clydine Parkison, MD [facility] Pager [phone]  If 7PM-7AM, please contact night-coverage [email or web address] Password [email or web address] 02/06/2018, 8:20 AM

## 2018-02-07 LAB — CBC
HCT: 24.3 % — ABNORMAL LOW (ref 36.0–46.0)
Hemoglobin: 7.7 g/dL — ABNORMAL LOW (ref 12.0–15.0)
MCH: 26.6 pg (ref 26.0–34.0)
MCHC: 31.7 g/dL (ref 30.0–36.0)
MCV: 84.1 fL (ref 80.0–100.0)
Platelets: 163 10*3/uL (ref 150–400)
RBC: 2.89 MIL/uL — ABNORMAL LOW (ref 3.87–5.11)
RDW: 17.3 % — ABNORMAL HIGH (ref 11.5–15.5)
WBC: 12.6 10*3/uL — ABNORMAL HIGH (ref 4.0–10.5)
nRBC: 0 % (ref 0.0–0.2)

## 2018-02-07 LAB — RETICULOCYTES
Immature Retic Fract: 40.4 % — ABNORMAL HIGH (ref 2.3–15.9)
RBC.: 2.89 MIL/uL — ABNORMAL LOW (ref 3.87–5.11)
Retic Count, Absolute: 74.6 10*3/uL (ref 19.0–186.0)
Retic Ct Pct: 2.6 % (ref 0.4–3.1)

## 2018-02-07 LAB — VITAMIN B12: Vitamin B-12: 501 pg/mL (ref 180–914)

## 2018-02-07 LAB — IRON AND TIBC
Iron: 25 ug/dL — ABNORMAL LOW (ref 28–170)
Saturation Ratios: 15 % (ref 10.4–31.8)
TIBC: 162 ug/dL — ABNORMAL LOW (ref 250–450)
UIBC: 137 ug/dL

## 2018-02-07 LAB — BASIC METABOLIC PANEL
Anion gap: 7 (ref 5–15)
BUN: 7 mg/dL — ABNORMAL LOW (ref 8–23)
CO2: 21 mmol/L — ABNORMAL LOW (ref 22–32)
Calcium: 8 mg/dL — ABNORMAL LOW (ref 8.9–10.3)
Chloride: 120 mmol/L — ABNORMAL HIGH (ref 98–111)
Creatinine, Ser: 0.78 mg/dL (ref 0.44–1.00)
GFR calc Af Amer: >60 mL/min (ref >60)
GFR calc non Af Amer: >60 mL/min (ref >60)
Glucose, Bld: 89 mg/dL (ref 70–99)
Potassium: 3.1 mmol/L — ABNORMAL LOW (ref 3.5–5.1)
Sodium: 148 mmol/L — ABNORMAL HIGH (ref 135–145)

## 2018-02-07 LAB — GLUCOSE, CAPILLARY: Glucose-Capillary: 98 mg/dL (ref 70–99)

## 2018-02-07 LAB — FOLATE: Folate: 5 ng/mL — ABNORMAL LOW (ref >5.9)

## 2018-02-07 LAB — FERRITIN: Ferritin: 168 ng/mL (ref 11–307)

## 2018-02-07 LAB — PHOSPHORUS: Phosphorus: 3.4 mg/dL (ref 2.5–4.6)

## 2018-02-07 MED ORDER — POTASSIUM CHLORIDE CRYS ER 20 MEQ PO TBCR
40.0000 meq | EXTENDED_RELEASE_TABLET | Freq: Two times a day (BID) | ORAL | Status: DC
  Administered 2018-02-07 – 2018-02-10 (×7): 40 meq via ORAL
  Filled 2018-02-07 (×7): qty 2

## 2018-02-07 MED ORDER — DEXTROSE 5 % IV SOLN
INTRAVENOUS | Status: DC
  Administered 2018-02-07 – 2018-02-08 (×4): 50 mL/h via INTRAVENOUS
  Administered 2018-02-09 – 2018-02-12 (×4): via INTRAVENOUS

## 2018-02-07 MED ORDER — SODIUM CHLORIDE 0.9 % IV SOLN
510.0000 mg | Freq: Once | INTRAVENOUS | Status: AC
  Administered 2018-02-07: 510 mg via INTRAVENOUS
  Filled 2018-02-07: qty 17

## 2018-02-07 MED ORDER — FOLIC ACID 1 MG PO TABS
1.0000 mg | ORAL_TABLET | Freq: Every day | ORAL | Status: DC
  Administered 2018-02-07 – 2018-02-13 (×7): 1 mg via ORAL
  Filled 2018-02-07 (×7): qty 1

## 2018-02-07 NOTE — Progress Notes (Signed)
PROGRESS NOTE    [name]  FAO:[id number] DOB: 10/04/1940 DOA: 01/27/2018 PCP: Nadara EatonPiazza, Michael J, MD    Brief Narrative ; 78 year old with past medical history significant for A. fib, chronic diastolic heart failure, coronary artery disease, Parkinson disease, history of CVA with right-sided weakness who was admitted with abdominal distention, poor oral intake and constipation. Patient was found to have severe abdominal distention and CT of the abdomen pelvis showed gaseous distention of the colon the very large stool burden identified within the rectum which favored a rectal impaction. Patient was found to have an AKI likely due to her poor p.o. intake.  GI was consulted for fecal impaction and colonic distention.  GI have been helping with management, GoLYTELY was ordered.  Unclear if patient has received or able to tolerate the GoLYTELY.  Patient has received multiple.  Patient was treated for hypernatremia and AKI.  The morning of 12-31, patient developed A. fib with RVR, shortness of breath.  EKG with ST depression.  Patient was also hypotensive with blood pressure in the 70s.  She received 1 L of IV bolus.  When her blood pressure improved she received a low dose metoprolol.  Cardiology was consulted.  Amiodarone was order.   Assessment & Plan:   Active Problems:   Essential hypertension   Coronary atherosclerosis   Atrial fibrillation (HCC)   Diastolic CHF (HCC)   Ambulatory dysfunction   Parkinson disease (HCC)   Constipation   Dementia due to Parkinson's disease with behavioral disturbance (HCC)   Hypokalemia   AKI (acute kidney injury) (HCC)   Leukocytosis   Acute on chronic respiratory failure with hypoxia (HCC)   Hyperbilirubinemia   Abdominal distention   Difficult intravenous access   Pressure injury of skin   1-A. fib with RVR, Hypotension;  Patient received IV fluids.  Blood pressure increased to the 100 range.  IV metoprolol PRN was ordered. Cardiology  was consulted, patient was a started on amiodarone. Patient subsequently sinus convert on her own before initiation of amiodarone. Hb trending down. Hold on start anticoagulation  Might be at high risk for bleeding.  Appreciate cardiology help/  -ECHO with preserve EF, hypokinesis of mid-inferiorlateral  wall myocardia. No cath planned.  -Rate controlled.  Needs amiodarone 400 mg BID for 5 more days, until 02-10-2018 then 200 mg daily.   HR has been stable.   2-Abdominal distention; in the setting of severe constipation and fecal impaction GI consulted and following Patient receive smog enema without any improvement. Per GI.  KUB improved.  Resolved.   3-Anemia; acute blood loss anemia;  Dark stool. Started  IV Protonix.  Occult blood positive.  GI doesn't think patient is having acute GI bleed. Will continue to monitor. Received 2 unit PRBC. Marland Kitchen.  Anemia panel consistent with iron deficiency anemia. Gave IV iron.  Hb decreased to 7.7. discussed with Dr Ewing SchleinMagod, he advised to hold aspirin and he will see patient in consultation again.  Started folic acid.    4-Hypokalemia Replete orally.   hypophosphatemia; resolve.    5-AKI; creatinine on admission at 3.1 Improved with IV fluids.  6-Pneumonia, leukocytosis; Question of possible aspiration. Received 10 days of antibiotics.   7-Acute hypoxic respiratory failure Suspect related to aspiration. Continue with oxygen supplement.Marland Kitchen.  8-Dementia Parkinson disease Delirium precaution. On quetiapine.  Leukocytosis; monitor.   Diarrhea; resolved.   Poor oral intake; hypernatremia; start D 5 IV fluids.  Encourage patient to eat.  Nutrition consultation.     Nutrition  Problem: Inadequate oral intake Etiology: acute illness, lethargy/confusion, other (see comment)(needs feeding assistance) Hypertension hold benazepril.     Signs/Symptoms: meal completion < 50%    Interventions: Ensure Enlive (each supplement provides  350kcal and 20 grams of protein), Magic cup, MVI  Estimated body mass index is 26.57 kg/m as calculated from the following:   Height as of this encounter: 5\' 2"  (1.575 m).   Weight as of this encounter: 65.9 kg.   DVT prophylaxis: heparin  Code Status: DNR Family Communication: no family at bedside.  Disposition Plan: remain in the step down unit.    Consultants:   Cardiology  GI   Procedures:  none  Antimicrobials Zosyn   Subjective: Confuse.  Denies abdominal pain.  Per nursing staff, no significant diarrhea.   Objective: Vitals:   02/07/18 0900 02/07/18 1000 02/07/18 1233 02/07/18 1600  BP: 112/67 (!) 116/47 (!) 118/58 135/70  Pulse: 79 83 83 92  Resp:  17 14 15   Temp:   98.1 F (36.7 C)   TempSrc:   Oral   SpO2: 97% 97% 98% 99%  Weight:      Height:        Intake/Output Summary (Last 24 hours) at 02/07/2018 1647 Last data filed at 02/07/2018 1600 Gross per 24 hour  Intake 965.31 ml  Output 1250 ml  Net -284.69 ml   Filed Weights   02/05/18 0333 02/06/18 0500 02/07/18 0439  Weight: 77.9 kg 76.8 kg 65.9 kg    Examination:  General exam: NAD Respiratory system: CTA Cardiovascular system: S 1, S 2 IRR Gastrointestinal system: BS present, soft, nt Central nervous system: alert, confuse.  Extremities: trace edema Skin: no rashes.      Data Reviewed: I have personally reviewed following labs and imaging studies  CBC: Recent Labs  Lab 02/02/18 0432 02/03/18 0528 02/04/18 0500 02/04/18 1421 02/04/18 2132 02/05/18 0351 02/06/18 0500 02/07/18 0259  WBC 19.8* 13.6* 13.7*  --  14.7* 11.7* 12.4* 12.6*  NEUTROABS 15.7* 8.8* 10.0*  --   --  8.2*  --   --   HGB 11.3* 9.6* 7.7* 7.3* 8.4* 7.5* 8.1* 7.7*  HCT 35.6* 30.5* 24.4* 23.2* 26.7* 24.1* 25.7* 24.3*  MCV 80.4 81.3 81.6  --  84.5 85.2 82.1 84.1  PLT 199 183 171  --  165 149* 159 163   Basic Metabolic Panel: Recent Labs  Lab 02/02/18 0432  02/03/18 0528 02/03/18 0529 02/04/18 0500  02/05/18 0351 02/06/18 0500 02/07/18 0259  NA 142  --   --  142 143  144 146* 141 148*  K 2.5*   < >  --  3.0* 3.1*  3.1* 3.2* 2.5* 3.1*  CL 114*  --   --  116* 120*  121* 122* 116* 120*  CO2 21*  --   --  20* 18*  19* 19* 19* 21*  GLUCOSE 126*  --   --  88 107*  108* 89 86 89  BUN 23  --   --  28* 18  19 15 12  7*  CREATININE 0.83  --   --  0.84 0.74  0.71 0.79 0.73 0.78  CALCIUM 7.7*  --   --  7.4* 7.6*  7.5* 7.5* 7.5* 8.0*  MG 2.3  --  2.0  --  2.2 2.1 1.9  --   PHOS 2.0*  --   --  1.6* 1.6*  1.6* 1.7* 2.8 3.4   < > = values in this interval not displayed.  GFR: Estimated Creatinine Clearance: 50.8 mL/min (by C-G formula based on SCr of 0.78 mg/dL). Liver Function Tests: Recent Labs  Lab 02/01/18 0534 02/02/18 0432 02/03/18 0529 02/04/18 0500 02/05/18 0351  AST  --   --   --  29  --   ALT  --   --   --  32  --   ALKPHOS  --   --   --  39  --   BILITOT  --   --   --  0.7  --   PROT  --   --   --  4.5*  --   ALBUMIN 2.6* 2.4* 2.2* 2.2*  2.3* 2.1*   No results for input(s): LIPASE, AMYLASE in the last 168 hours. No results for input(s): AMMONIA in the last 168 hours. Coagulation Profile: No results for input(s): INR, PROTIME in the last 168 hours. Cardiac Enzymes: Recent Labs  Lab 02/03/18 1345 02/03/18 2044  TROPONINI 0.09* 0.23*   BNP (last 3 results) No results for input(s): PROBNP in the last 8760 hours. HbA1C: No results for input(s): HGBA1C in the last 72 hours. CBG: Recent Labs  Lab 02/03/18 0811 02/04/18 0800 02/05/18 0824 02/06/18 0734 02/07/18 1138  GLUCAP 88 93 86 87 98   Lipid Profile: No results for input(s): CHOL, HDL, LDLCALC, TRIG, CHOLHDL, LDLDIRECT in the last 72 hours. Thyroid Function Tests: No results for input(s): TSH, T4TOTAL, FREET4, T3FREE, THYROIDAB in the last 72 hours. Anemia Panel: Recent Labs    02/07/18 0259  VITAMINB12 501  FOLATE 5.0*  FERRITIN 168  TIBC 162*  IRON 25*  RETICCTPCT 2.6   Sepsis  Labs: Recent Labs  Lab 02/02/18 1227 02/03/18 1332  PROCALCITON 2.24  --   LATICACIDVEN  --  1.9    Recent Results (from the past 240 hour(s))  MRSA PCR Screening     Status: None   Collection Time: 01/29/18  2:13 AM  Result Value Ref Range Status   MRSA by PCR NEGATIVE NEGATIVE Final    Comment:        The GeneXpert MRSA Assay (FDA approved for NASAL specimens only), is one component of a comprehensive MRSA colonization surveillance program. It is not intended to diagnose MRSA infection nor to guide or monitor treatment for MRSA infections. Performed at [facility], [address], [place], [place] [address]          Radiology Studies: No results found.      Scheduled Meds: . sodium chloride   Intravenous Once  . amiodarone  400 mg Oral BID  . atorvastatin  40 mg Oral q1800  . Chlorhexidine Gluconate Cloth  6 each Topical Daily  . feeding supplement (ENSURE ENLIVE)  237 mL Oral BID BM  . folic acid  1 mg Oral Daily  . metoprolol tartrate  25 mg Oral BID  . multivitamin with minerals  1 tablet Oral Daily  . pantoprazole (PROTONIX) IV  40 mg Intravenous Q12H  . phosphorus  500 mg Oral BID  . potassium chloride  40 mEq Oral BID  . sodium chloride flush  10-40 mL Intracatheter Q12H  . [START ON 02/18/2018] Vitamin D (Ergocalciferol)  50,000 Units Oral Q30 days   Continuous Infusions: . dextrose 50 mL/hr at 02/07/18 1600     LOS: 6 days    Time spent: 35 minutes    Alba Cory, MD [facility] Pager [phone]  If 7PM-7AM, please contact night-coverage [email or web address] Password [facility] 02/07/2018, 4:47 PM

## 2018-02-07 NOTE — Progress Notes (Signed)
Pt refuses to let us check her blood sugar, pt is only oriented to self at this time

## 2018-02-08 LAB — CBC
HCT: 23.9 % — ABNORMAL LOW (ref 36.0–46.0)
Hemoglobin: 7.4 g/dL — ABNORMAL LOW (ref 12.0–15.0)
MCH: 26.3 pg (ref 26.0–34.0)
MCHC: 31 g/dL (ref 30.0–36.0)
MCV: 85.1 fL (ref 80.0–100.0)
Platelets: 172 10*3/uL (ref 150–400)
RBC: 2.81 MIL/uL — ABNORMAL LOW (ref 3.87–5.11)
RDW: 18 % — ABNORMAL HIGH (ref 11.5–15.5)
WBC: 11.3 10*3/uL — ABNORMAL HIGH (ref 4.0–10.5)
nRBC: 0.2 % (ref 0.0–0.2)

## 2018-02-08 LAB — BASIC METABOLIC PANEL
Anion gap: 5 (ref 5–15)
BUN: 6 mg/dL — ABNORMAL LOW (ref 8–23)
CO2: 24 mmol/L (ref 22–32)
Calcium: 8.3 mg/dL — ABNORMAL LOW (ref 8.9–10.3)
Chloride: 117 mmol/L — ABNORMAL HIGH (ref 98–111)
Creatinine, Ser: 0.78 mg/dL (ref 0.44–1.00)
GFR calc Af Amer: >60 mL/min (ref >60)
GFR calc non Af Amer: >60 mL/min (ref >60)
Glucose, Bld: 92 mg/dL (ref 70–99)
Potassium: 3.2 mmol/L — ABNORMAL LOW (ref 3.5–5.1)
Sodium: 146 mmol/L — ABNORMAL HIGH (ref 135–145)

## 2018-02-08 LAB — PHOSPHORUS: Phosphorus: 2.7 mg/dL (ref 2.5–4.6)

## 2018-02-08 LAB — GLUCOSE, CAPILLARY: Glucose-Capillary: 92 mg/dL (ref 70–99)

## 2018-02-08 MED ORDER — K PHOS MONO-SOD PHOS DI & MONO 155-852-130 MG PO TABS
250.0000 mg | ORAL_TABLET | Freq: Every day | ORAL | Status: DC
  Administered 2018-02-08 – 2018-02-13 (×6): 250 mg via ORAL
  Filled 2018-02-08 (×6): qty 1

## 2018-02-08 MED ORDER — POTASSIUM CHLORIDE CRYS ER 20 MEQ PO TBCR
40.0000 meq | EXTENDED_RELEASE_TABLET | Freq: Once | ORAL | Status: AC
  Administered 2018-02-08: 40 meq via ORAL
  Filled 2018-02-08: qty 2

## 2018-02-08 NOTE — Progress Notes (Signed)
PROGRESS NOTE    [name]  JSR:[id number] DOB: 20-Aug-1940 DOA: 01/27/2018 PCP: [name] Eaton, MD    Brief Narrative ; 78 year old with past medical history significant for A. fib, chronic diastolic heart failure, coronary artery disease, Parkinson disease, history of CVA with right-sided weakness who was admitted with abdominal distention, poor oral intake and constipation. Patient was found to have severe abdominal distention and CT of the abdomen pelvis showed gaseous distention of the colon the very large stool burden identified within the rectum which favored a rectal impaction. Patient was found to have an AKI likely due to her poor p.o. intake.  GI was consulted for fecal impaction and colonic distention.  GI have been helping with management, GoLYTELY was ordered.  Unclear if patient has received or able to tolerate the GoLYTELY.  Patient has received multiple.  Patient was treated for hypernatremia and AKI.  The morning of 12-31, patient developed A. fib with RVR, shortness of breath.  EKG with ST depression.  Patient was also hypotensive with blood pressure in the 70s.  She received 1 L of IV bolus.  When her blood pressure improved she received a low dose metoprolol.  Cardiology was consulted.  Amiodarone was order.  Atrial fibrillation has been a stable.  Her hemoglobin continued to drop.  GI planning further  evaluation.   Assessment & Plan:   Active Problems:   Essential hypertension   Coronary atherosclerosis   Atrial fibrillation (HCC)   Diastolic CHF (HCC)   Ambulatory dysfunction   Parkinson disease (HCC)   Constipation   Dementia due to Parkinson's disease with behavioral disturbance (HCC)   Hypokalemia   AKI (acute kidney injury) (HCC)   Leukocytosis   Acute on chronic respiratory failure with hypoxia (HCC)   Hyperbilirubinemia   Abdominal distention   Difficult intravenous access   Pressure injury of skin   1-A. fib with RVR, Hypotension;  Patient  received IV fluids.  Blood pressure increased to the 100 range.  IV metoprolol PRN was ordered. Cardiology was consulted, patient was a started on amiodarone. Patient subsequently sinus convert on her own before initiation of amiodarone. Hb trending down. Hold on start anticoagulation  Might be at high risk for bleeding.  Appreciate cardiology help/  -ECHO with preserve EF, hypokinesis of mid-inferiorlateral  wall myocardia. No cath planned.  -Rate controlled.  Needs amiodarone 400 mg BID for 5  days, until 02-10-2018 then 200 mg daily.   HR has been stable.   2-Abdominal distention; in the setting of severe constipation and fecal impaction, colonic ileus.  GI consulted and following Patient receive smog enema without any improvement. Resolved with Golytely.  KUB improved.  Resolved.   3-Anemia; Acute blood loss anemia;  Dark stool. Started  IV Protonix.  Occult blood positive.  Will continue to monitor. Received 2 unit PRBC during admission.  Anemia panel consistent with iron deficiency anemia. Gave IV iron.  Started folic acid.  Hb at 7.4. discussed with Dr [name], stop aspirin, repeat CBC in am. Dr [name] will determine further evaluation. Updated family.   4-Hypokalemia Replete orally.   hypophosphatemia; resolved.    5-AKI; creatinine on admission at 3.1 Improved with IV fluids.  6-Pneumonia, leukocytosis; Question of possible aspiration. Received 10 days of antibiotics.   7-Acute hypoxic respiratory failure Suspect related to aspiration. Continue with oxygen supplement.Marland Kitchen  8-Dementia Parkinson disease Delirium precaution. On quetiapine.  Leukocytosis; monitor.   Diarrhea; improved.   Poor oral intake; hypernatremia; started  D 5  IV fluids.  Encourage patient to eat.  Nutrition consultation.     Nutrition Problem: Inadequate oral intake Etiology: acute illness, lethargy/confusion, other (see comment)(needs feeding assistance) Hypertension hold  benazepril.     Signs/Symptoms: meal completion < 50%    Interventions: Ensure Enlive (each supplement provides 350kcal and 20 grams of protein), Magic cup, MVI  Estimated body mass index is 25.36 kg/m as calculated from the following:   Height as of this encounter: 5\' 2"  (1.575 m).   Weight as of this encounter: 62.9 kg.   DVT prophylaxis: heparin  Code Status: DNR Family Communication: daughter at bedside.  Disposition Plan: remain in the step down unit.    Consultants:   Cardiology  GI   Procedures:  none  Antimicrobials Zosyn   Subjective: She is lying down in bed, denies abdominal pain.  Had black stool yesterday, greenish stool per nurse today   Objective: Vitals:   02/08/18 0000 02/08/18 0407 02/08/18 0500 02/08/18 0509  BP: 122/70 (!) 91/40  96/70  Pulse: 66   66  Resp: 13   19  Temp: (!) 97.4 F (36.3 C) 97.8 F (36.6 C)    TempSrc: Oral Axillary    SpO2: 96%   100%  Weight:   62.9 kg   Height:        Intake/Output Summary (Last 24 hours) at 02/08/2018 0750 Last data filed at 02/08/2018 0516 Gross per 24 hour  Intake 1786.99 ml  Output 1625 ml  Net 161.99 ml   Filed Weights   02/06/18 0500 02/07/18 0439 02/08/18 0500  Weight: 76.8 kg 65.9 kg 62.9 kg    Examination:  General exam: NAD Respiratory system: CTA Cardiovascular system: S 1, S 2 IRR Gastrointestinal system: BS present, soft nt Central nervous system: Alert, confuse Extremities: trace edema Skin: no rashes.      Data Reviewed: I have personally reviewed following labs and imaging studies  CBC: Recent Labs  Lab 02/02/18 0432 02/03/18 0528 02/04/18 0500  02/04/18 2132 02/05/18 0351 02/06/18 0500 02/07/18 0259 02/08/18 0508  WBC 19.8* 13.6* 13.7*  --  14.7* 11.7* 12.4* 12.6* 11.3*  NEUTROABS 15.7* 8.8* 10.0*  --   --  8.2*  --   --   --   HGB 11.3* 9.6* 7.7*   < > 8.4* 7.5* 8.1* 7.7* 7.4*  HCT 35.6* 30.5* 24.4*   < > 26.7* 24.1* 25.7* 24.3* 23.9*  MCV 80.4  81.3 81.6  --  84.5 85.2 82.1 84.1 85.1  PLT 199 183 171  --  165 149* 159 163 172   < > = values in this interval not displayed.   Basic Metabolic Panel: Recent Labs  Lab 02/02/18 0432  02/03/18 0528  02/04/18 0500 02/05/18 0351 02/06/18 0500 02/07/18 0259 02/08/18 0508  NA 142  --   --    < > 143  144 146* 141 148* 146*  K 2.5*   < >  --    < > 3.1*  3.1* 3.2* 2.5* 3.1* 3.2*  CL 114*  --   --    < > 120*  121* 122* 116* 120* 117*  CO2 21*  --   --    < > 18*  19* 19* 19* 21* 24  GLUCOSE 126*  --   --    < > 107*  108* 89 86 89 92  BUN 23  --   --    < > 18  19 15 12  7* 6*  CREATININE  0.83  --   --    < > 0.74  0.71 0.79 0.73 0.78 0.78  CALCIUM 7.7*  --   --    < > 7.6*  7.5* 7.5* 7.5* 8.0* 8.3*  MG 2.3  --  2.0  --  2.2 2.1 1.9  --   --   PHOS 2.0*  --   --    < > 1.6*  1.6* 1.7* 2.8 3.4 2.7   < > = values in this interval not displayed.   GFR: Estimated Creatinine Clearance: 48.9 mL/min (by C-G formula based on SCr of 0.78 mg/dL). Liver Function Tests: Recent Labs  Lab 02/02/18 0432 02/03/18 0529 02/04/18 0500 02/05/18 0351  AST  --   --  29  --   ALT  --   --  32  --   ALKPHOS  --   --  39  --   BILITOT  --   --  0.7  --   PROT  --   --  4.5*  --   ALBUMIN 2.4* 2.2* 2.2*  2.3* 2.1*   No results for input(s): LIPASE, AMYLASE in the last 168 hours. No results for input(s): AMMONIA in the last 168 hours. Coagulation Profile: No results for input(s): INR, PROTIME in the last 168 hours. Cardiac Enzymes: Recent Labs  Lab 02/03/18 1345 02/03/18 2044  TROPONINI 0.09* 0.23*   BNP (last 3 results) No results for input(s): PROBNP in the last 8760 hours. HbA1C: No results for input(s): HGBA1C in the last 72 hours. CBG: Recent Labs  Lab 02/03/18 0811 02/04/18 0800 02/05/18 0824 02/06/18 0734 02/07/18 1138  GLUCAP 88 93 86 87 98   Lipid Profile: No results for input(s): CHOL, HDL, LDLCALC, TRIG, CHOLHDL, LDLDIRECT in the last 72 hours. Thyroid  Function Tests: No results for input(s): TSH, T4TOTAL, FREET4, T3FREE, THYROIDAB in the last 72 hours. Anemia Panel: Recent Labs    02/07/18 0259  VITAMINB12 501  FOLATE 5.0*  FERRITIN 168  TIBC 162*  IRON 25*  RETICCTPCT 2.6   Sepsis Labs: Recent Labs  Lab 02/02/18 1227 02/03/18 1332  PROCALCITON 2.24  --   LATICACIDVEN  --  1.9    No results found for this or any previous visit (from the past 240 hour(s)).       Radiology Studies: No results found.      Scheduled Meds: . sodium chloride   Intravenous Once  . amiodarone  400 mg Oral BID  . atorvastatin  40 mg Oral q1800  . Chlorhexidine Gluconate Cloth  6 each Topical Daily  . feeding supplement (ENSURE ENLIVE)  237 mL Oral BID BM  . folic acid  1 mg Oral Daily  . metoprolol tartrate  25 mg Oral BID  . multivitamin with minerals  1 tablet Oral Daily  . pantoprazole (PROTONIX) IV  40 mg Intravenous Q12H  . potassium chloride  40 mEq Oral BID  . potassium chloride  40 mEq Oral Once  . sodium chloride flush  10-40 mL Intracatheter Q12H  . [START ON 02/18/2018] Vitamin D (Ergocalciferol)  50,000 Units Oral Q30 days   Continuous Infusions: . dextrose 50 mL/hr at 02/07/18 1700     LOS: 7 days    Time spent: 35 minutes    Alba Cory, MD [facility] Pager [phone]  If 7PM-7AM, please contact night-coverage [email or web address] Password [email or web address] 02/08/2018, 7:50 AM

## 2018-02-08 NOTE — Progress Notes (Signed)
Miki Kins 9:11 AM  Subjective: Patient without any GI complaints and lying Comfortably in the bed without signs of active bleeding and we are asked by the hospital team to reevaluate for a slowly decreasing hemoglobin and her hospital computer chart was reviewed and the patient was seen and examined and she denies any previous GI work-up but I am reassured that her admission CT did not show anything significant and she has been on an aspirin a day as well as a pump inhibitor in the hospital and denies any dark stools either her family history is negative from a GI standpoint and she is tolerating regular food Objective: Vital signs stable afebrile no acute distress exam pertinent for her previous CVA abdomen is soft nontender BUN and creatinine normal hemoglobin slow decrease however she is not iron deficient but was guaiac positive one time during this hospital stay  Assessment: Anemia with episodic guaiac positive on aspirin and patient with multiple medical problems  Plan: Patient seems stable enough for GI work-up but will allow my partner who is covering next week Dr. Matthias Hughs to see on Monday and decide the timing and and if work-up is really warranted but might consider an endoscopy and flex sig or give her a little more time off her aspirin to see if transfusion needs to decrease  [facility] E  Pager [phone] After 5PM or if no answer call [phone]

## 2018-02-09 LAB — BASIC METABOLIC PANEL
Anion gap: 6 (ref 5–15)
BUN: 8 mg/dL (ref 8–23)
CO2: 23 mmol/L (ref 22–32)
Calcium: 8.5 mg/dL — ABNORMAL LOW (ref 8.9–10.3)
Chloride: 115 mmol/L — ABNORMAL HIGH (ref 98–111)
Creatinine, Ser: 0.82 mg/dL (ref 0.44–1.00)
GFR calc Af Amer: >60 mL/min (ref >60)
GFR calc non Af Amer: >60 mL/min (ref >60)
Glucose, Bld: 98 mg/dL (ref 70–99)
Potassium: 3.6 mmol/L (ref 3.5–5.1)
Sodium: 144 mmol/L (ref 135–145)

## 2018-02-09 LAB — URINALYSIS, ROUTINE W REFLEX MICROSCOPIC
Bilirubin Urine: NEGATIVE
Glucose, UA: NEGATIVE mg/dL
Hgb urine dipstick: NEGATIVE
Ketones, ur: NEGATIVE mg/dL
Leukocytes, UA: NEGATIVE
Nitrite: NEGATIVE
Protein, ur: NEGATIVE mg/dL
Specific Gravity, Urine: 1.004 — ABNORMAL LOW (ref 1.005–1.030)
pH: 5 (ref 5.0–8.0)

## 2018-02-09 LAB — CBC
HCT: 26.6 % — ABNORMAL LOW (ref 36.0–46.0)
Hemoglobin: 8.1 g/dL — ABNORMAL LOW (ref 12.0–15.0)
MCH: 26.6 pg (ref 26.0–34.0)
MCHC: 30.5 g/dL (ref 30.0–36.0)
MCV: 87.2 fL (ref 80.0–100.0)
Platelets: 202 10*3/uL (ref 150–400)
RBC: 3.05 MIL/uL — ABNORMAL LOW (ref 3.87–5.11)
RDW: 18.9 % — ABNORMAL HIGH (ref 11.5–15.5)
WBC: 13.8 10*3/uL — ABNORMAL HIGH (ref 4.0–10.5)
nRBC: 0.1 % (ref 0.0–0.2)

## 2018-02-09 LAB — GLUCOSE, CAPILLARY: Glucose-Capillary: 96 mg/dL (ref 70–99)

## 2018-02-09 MED ORDER — ASPIRIN EC 81 MG PO TBEC
81.0000 mg | DELAYED_RELEASE_TABLET | Freq: Every day | ORAL | Status: DC
  Administered 2018-02-09 – 2018-02-13 (×5): 81 mg via ORAL
  Filled 2018-02-09 (×5): qty 1

## 2018-02-09 MED ORDER — ENSURE ENLIVE PO LIQD
237.0000 mL | Freq: Two times a day (BID) | ORAL | Status: DC
  Administered 2018-02-09 – 2018-02-13 (×8): 237 mL via ORAL

## 2018-02-09 NOTE — Progress Notes (Addendum)
Patient's hemoglobin is back up today, without transfusion.    Has received 2 units of packed cells since admission approximately 2 weeks ago, but basically, hemoglobin has been level for approximately the past 3 or 4 days.  Heme positive stool 5 days ago noted, but was on aspirin at that time (currently on hold), has had enemas, etc. and there is no evidence of overt GI bleeding such as melenic stool.  The patient denies symptoms, problems, or complaints but it is unclear how much insight she has into her condition.  Patient is sitting up in bed, no distress, alert and pleasant, but with somewhat vacant communication.  She is drinking a bottle of boost while I am there.  Abdomen is nontender.   Recommendations:  1.  I do not feel that this patient is a prudent candidate for work-up of her heme positivity, given her advanced age, multiple medical problems and limitations, the absence of apparent GI symptoms (although a poor historian), and the absence of overt bleeding.  2.  No objection to resumption of aspirin as long as patient is co-treated with PPI prophylaxis.  3.  Since it does not appear that this patient will need further GI evaluation, we will sign off.  However, please feel free to contact us if her clinical evolution suggests a stronger need for GI tract evaluation.  4.  Above discussed with Dr. Sunnie Nielsenegalado.  Florencia Reasonsobert V. Deserae Jennings, M.D. Pager [phone] If no answer or after 5 PM call [phone]

## 2018-02-09 NOTE — Progress Notes (Signed)
PROGRESS NOTE    [name]  ZOX:[id number] DOB: [birth year] DOA: 01/27/2018 PCP: Nadara Eaton, MD    Brief Narrative ; 78 year old with past medical history significant for A. fib, chronic diastolic heart failure, coronary artery disease, Parkinson disease, history of CVA with right-sided weakness who was admitted with abdominal distention, poor oral intake and constipation. Patient was found to have severe abdominal distention and CT of the abdomen pelvis showed gaseous distention of the colon the very large stool burden identified within the rectum which favored a rectal impaction. Patient was found to have an AKI likely due to her poor p.o. intake.  GI was consulted for fecal impaction and colonic distention.  GI have been helping with management, GoLYTELY was ordered.  Unclear if patient has received or able to tolerate the GoLYTELY.  Patient has received multiple.  Patient was treated for hypernatremia and AKI.  The morning of 12-31, patient developed A. fib with RVR, shortness of breath.  EKG with ST depression.  Patient was also hypotensive with blood pressure in the 70s.  She received 1 L of IV bolus.  When her blood pressure improved she received a low dose metoprolol.  Cardiology was consulted.  Amiodarone was order.  Atrial fibrillation has been a stable.  Her hemoglobin continued to drop.  GI planning further  evaluation.   Assessment & Plan:   Active Problems:   Essential hypertension   Coronary atherosclerosis   Atrial fibrillation (HCC)   Diastolic CHF (HCC)   Ambulatory dysfunction   Parkinson disease (HCC)   Constipation   Dementia due to Parkinson's disease with behavioral disturbance (HCC)   Hypokalemia   AKI (acute kidney injury) (HCC)   Leukocytosis   Acute on chronic respiratory failure with hypoxia (HCC)   Hyperbilirubinemia   Abdominal distention   Difficult intravenous access   Pressure injury of skin   1-A. fib with RVR, Hypotension;  Patient  received IV fluids.  Blood pressure increased to the 100 range.  IV metoprolol PRN was ordered. Cardiology was consulted, patient was a started on amiodarone. Patient subsequently sinus convert on her own before initiation of amiodarone. Hb trending down. Hold on start anticoagulation  Might be at high risk for bleeding.  Appreciate cardiology help/  -ECHO with preserve EF, hypokinesis of mid-inferiorlateral  wall myocardia. No cath planned.  -Rate controlled.  Needs amiodarone 400 mg BID for 5  days, until 02-10-2018 then 200 mg daily.   HR has been stable.  Transfer to telemetry.   2-Abdominal distention; in the setting of severe constipation and fecal impaction, colonic ileus.  GI consulted and following Patient receive smog enema without any improvement. Resolved with Golytely.  KUB improved.  Resolved.   3-Anemia; Acute blood loss anemia;  Dark stool. Started  IV Protonix.  Occult blood positive.  Will continue to monitor. Received 2 unit PRBC during admission.  Anemia panel consistent with iron deficiency anemia. Gave IV iron.  Started folic acid.  Discussed with Dr Chapman Moss, hb increasing. Ok to resume aspirin. Hold on further endoscopy.  Monitor hb.  4-Hypokalemia Replete orally.  Continue with kcl BID>   hypophosphatemia; resolved.    5-AKI; creatinine on admission at 3.1 Improved with IV fluids.  6-Pneumonia, leukocytosis; Question of possible aspiration. Received 10 days of antibiotics.   7-Acute hypoxic respiratory failure Suspect related to aspiration. Continue with oxygen supplement.Marland Kitchen  8-Dementia Parkinson disease Delirium precaution. On quetiapine.  Leukocytosis; monitor. Check UA.   Diarrhea; improved.   Poor oral  intake; hypernatremia; started  D 5 IV fluids.  Encourage patient to eat.  Nutrition consultation.  Hypernatremia, improving/    Nutrition Problem: Inadequate oral intake Etiology: acute illness, lethargy/confusion, other (see  comment)(needs feeding assistance) Hypertension hold benazepril.     Signs/Symptoms: meal completion < 50%    Interventions: Ensure Enlive (each supplement provides 350kcal and 20 grams of protein), Magic cup, MVI  Estimated body mass index is 32.58 kg/m as calculated from the following:   Height as of this encounter: 5\' 2"  (1.575 m).   Weight as of this encounter: 80.8 kg.   DVT prophylaxis: heparin  Code Status: DNR Family Communication: daughter at bedside.  Disposition Plan: remain in the step down unit.    Consultants:   Cardiology  GI   Procedures:  none  Antimicrobials Zosyn   Subjective: Alert, answering questions. Denies abdominal pain.    Objective: Vitals:   02/08/18 2347 02/09/18 0232 02/09/18 0400 02/09/18 0433  BP:    123/61  Pulse:    71  Resp:    17  Temp: (!) 97.5 F (36.4 C)  97.6 F (36.4 C)   TempSrc: Axillary  Axillary   SpO2:    96%  Weight:  80.8 kg    Height:        Intake/Output Summary (Last 24 hours) at 02/09/2018 0803 Last data filed at 02/09/2018 0600 Gross per 24 hour  Intake 1715.35 ml  Output 1350 ml  Net 365.35 ml   Filed Weights   02/07/18 0439 02/08/18 0500 02/09/18 0232  Weight: 65.9 kg 62.9 kg 80.8 kg    Examination:  General exam: NAD Respiratory system: CTA Cardiovascular system: S1, S 2 IRR Gastrointestinal system: BS present, soft, nt Central nervous system: alert,  Extremities: trace edema Skin: no rashes.      Data Reviewed: I have personally reviewed following labs and imaging studies  CBC: Recent Labs  Lab 02/03/18 0528 02/04/18 0500  02/05/18 0351 02/06/18 0500 02/07/18 0259 02/08/18 0508 02/09/18 0212  WBC 13.6* 13.7*   < > 11.7* 12.4* 12.6* 11.3* 13.8*  NEUTROABS 8.8* 10.0*  --  8.2*  --   --   --   --   HGB 9.6* 7.7*   < > 7.5* 8.1* 7.7* 7.4* 8.1*  HCT 30.5* 24.4*   < > 24.1* 25.7* 24.3* 23.9* 26.6*  MCV 81.3 81.6   < > 85.2 82.1 84.1 85.1 87.2  PLT 183 171   < > 149* 159  163 172 202   < > = values in this interval not displayed.   Basic Metabolic Panel: Recent Labs  Lab 02/03/18 0528  02/04/18 0500 02/05/18 0351 02/06/18 0500 02/07/18 0259 02/08/18 0508 02/09/18 0212  NA  --    < > 143  144 146* 141 148* 146* 144  K  --    < > 3.1*  3.1* 3.2* 2.5* 3.1* 3.2* 3.6  CL  --    < > 120*  121* 122* 116* 120* 117* 115*  CO2  --    < > 18*  19* 19* 19* 21* 24 23  GLUCOSE  --    < > 107*  108* 89 86 89 92 98  BUN  --    < > 18  19 15 12  7* 6* 8  CREATININE  --    < > 0.74  0.71 0.79 0.73 0.78 0.78 0.82  CALCIUM  --    < > 7.6*  7.5* 7.5* 7.5* 8.0* 8.3*  8.5*  MG 2.0  --  2.2 2.1 1.9  --   --   --   PHOS  --    < > 1.6*  1.6* 1.7* 2.8 3.4 2.7  --    < > = values in this interval not displayed.   GFR: Estimated Creatinine Clearance: 53.9 mL/min (by C-G formula based on SCr of 0.82 mg/dL). Liver Function Tests: Recent Labs  Lab 02/03/18 0529 02/04/18 0500 02/05/18 0351  AST  --  29  --   ALT  --  32  --   ALKPHOS  --  39  --   BILITOT  --  0.7  --   PROT  --  4.5*  --   ALBUMIN 2.2* 2.2*  2.3* 2.1*   No results for input(s): LIPASE, AMYLASE in the last 168 hours. No results for input(s): AMMONIA in the last 168 hours. Coagulation Profile: No results for input(s): INR, PROTIME in the last 168 hours. Cardiac Enzymes: Recent Labs  Lab 02/03/18 1345 02/03/18 2044  TROPONINI 0.09* 0.23*   BNP (last 3 results) No results for input(s): PROBNP in the last 8760 hours. HbA1C: No results for input(s): HGBA1C in the last 72 hours. CBG: Recent Labs  Lab 02/05/18 0824 02/06/18 0734 02/07/18 1138 02/08/18 0747 02/09/18 0734  GLUCAP 86 87 98 92 96   Lipid Profile: No results for input(s): CHOL, HDL, LDLCALC, TRIG, CHOLHDL, LDLDIRECT in the last 72 hours. Thyroid Function Tests: No results for input(s): TSH, T4TOTAL, FREET4, T3FREE, THYROIDAB in the last 72 hours. Anemia Panel: Recent Labs    02/07/18 0259  VITAMINB12 501  FOLATE  5.0*  FERRITIN 168  TIBC 162*  IRON 25*  RETICCTPCT 2.6   Sepsis Labs: Recent Labs  Lab 02/02/18 1227 02/03/18 1332  PROCALCITON 2.24  --   LATICACIDVEN  --  1.9    No results found for this or any previous visit (from the past 240 hour(s)).       Radiology Studies: No results found.      Scheduled Meds: . sodium chloride   Intravenous Once  . amiodarone  400 mg Oral BID  . atorvastatin  40 mg Oral q1800  . Chlorhexidine Gluconate Cloth  6 each Topical Daily  . feeding supplement (ENSURE ENLIVE)  237 mL Oral BID BM  . folic acid  1 mg Oral Daily  . metoprolol tartrate  25 mg Oral BID  . multivitamin with minerals  1 tablet Oral Daily  . pantoprazole (PROTONIX) IV  40 mg Intravenous Q12H  . phosphorus  250 mg Oral Daily  . potassium chloride  40 mEq Oral BID  . sodium chloride flush  10-40 mL Intracatheter Q12H  . [START ON 02/18/2018] Vitamin D (Ergocalciferol)  50,000 Units Oral Q30 days   Continuous Infusions: . dextrose 50 mL/hr at 02/09/18 0400     LOS: 8 days    Time spent: 35 minutes    Alba CoryBelkys A Jara Feider, MD [facility] Pager [phone]  If 7PM-7AM, please contact night-coverage [email or web address] Password [email or web address] 02/09/2018, 8:03 AM

## 2018-02-09 NOTE — Progress Notes (Signed)
PT Cancellation Note  Patient Details Name: [name] MRN: [id number] DOB: [birth year]   Cancelled Treatment:    Reason Eval/Treat Not Completed: Other (comment); Pt is max/total care at baseline (from SNF); per nursing pt is unable to roll or self feed or do any tasks for herself, previous PT notes from 4 years ago have pt at max/total assist level; PT signing off, defer any further needs to [facility]   [facility] 02/09/2018, 11:04 AM

## 2018-02-09 NOTE — Plan of Care (Signed)
  Problem: Clinical Measurements: Goal: Will remain free from infection Outcome: Progressing   Problem: Elimination: Goal: Will not experience complications related to bowel motility Outcome: Progressing   Problem: Safety: Goal: Ability to remain free from injury will improve Outcome: Progressing   

## 2018-02-09 NOTE — Progress Notes (Signed)
LCSW following for dc planning.   Patient is a LTC resident at [facility].   LCSW will continue to follow for dc needs.   Beulah Gandy [facility] CSW [phone]

## 2018-02-09 NOTE — Progress Notes (Signed)
Follow-Up Nutrition Assessment  DOCUMENTATION CODES:   Obesity unspecified  INTERVENTION:  - Continue Ensure Enlive BID and Magic Cup BID. - Continue to encourage PO intakes. - Staff to continue to evaluate for need for feeding assistance and assist patient if needed.    NUTRITION DIAGNOSIS:   Inadequate oral intake related to acute illness, lethargy/confusion, other (see comment)(needs feeding assistance) as evidenced by meal completion < 50%. -ongoing but improving  GOAL:   Patient will meet greater than or equal to 90% of their needs -beginning to meet  MONITOR:   PO intake, Supplement acceptance, Weight trends, Labs, Skin  REASON FOR ASSESSMENT:   Consult Assessment of nutrition requirement/status  ASSESSMENT:   78 year old with past medical history significant for A. fib, CHF, CAD, Parkinson's disease, and history of CVA with R-sided weakness. She was admitted with abdominal distention, poor oral intake, and constipation. Patient was found to have severe abdominal distention and CT of the abdomen pelvis showed gaseous distention of the colon with very large stool burden identified within the rectum which favored a rectal impaction. Patient was found to have an AKI likely d/t her poor p.o. intake. The morning of 12/31 she developed A. fib with RVR and SOB.  Weight slightly up from admission. Patient continues to be a/o to self only. She was unable to provide much reliable information. No family/visitors present at bedside. RN reports that tech informed her that patient had eaten ~50% of breakfast this AM.  Per review of flow sheet, patient consumed 50% of breakfast, 25% of lunch, and 90% of dinner on 1/4; 50% of breakfast, 50% of lunch brought by family, and 30% of dinner on 1/5.  Per review of order, she has accepted 4 of 9 bottles of Ensure. A bottle of Ensure was on bedside table and patient had consumed 100% of it and reported that she enjoyed it.     Medications  reviewed; 1 mg folvite/day, daily multivitamin with minerals, 250 mg KPhos/day, 40 mEq K-dur BID. Labs reviewed; CBGs: 92 and 96 mg/dL today, Cl: 370 mmol/L, Ca: 8.5 mg/dL. IVF; D5 @ 50 mL/hr (204 kcal).   Diet Order:   Diet Order            Diet regular Room service appropriate? Yes; Fluid consistency: Thin  Diet effective now              EDUCATION NEEDS:   Not appropriate for education at this time  Skin:  Skin Assessment: Skin Integrity Issues: Skin Integrity Issues:: Stage II Stage II: sacrum x2  Last BM:  1/5  Height:   Ht Readings from Last 1 Encounters:  02/03/18 5\' 2"  (1.575 m)    Weight:   Wt Readings from Last 1 Encounters:  02/09/18 80.8 kg    Ideal Body Weight:  50 kg  BMI:  Body mass index is 32.58 kg/m.  Estimated Nutritional Needs:   Kcal:  1405-1640 kcal  Protein:  75-90 grams  Fluid:  >/= 1.8 L/day     Trenton Gammon, MS, RD, LDN, [facility] In[facility] Clinical Dietitian Pager # [phone] After hours/weekend pager # [phone]

## 2018-02-09 NOTE — Progress Notes (Signed)
02/09/2018  1200  IV team came to place peripheral IV. Two attempts were unsuccessful. Patient then became agitated. Not able to remove IJ.

## 2018-02-10 LAB — CBC
HCT: 25.3 % — ABNORMAL LOW (ref 36.0–46.0)
Hemoglobin: 7.7 g/dL — ABNORMAL LOW (ref 12.0–15.0)
MCH: 26.7 pg (ref 26.0–34.0)
MCHC: 30.4 g/dL (ref 30.0–36.0)
MCV: 87.8 fL (ref 80.0–100.0)
Platelets: 202 10*3/uL (ref 150–400)
RBC: 2.88 MIL/uL — ABNORMAL LOW (ref 3.87–5.11)
RDW: 19.2 % — ABNORMAL HIGH (ref 11.5–15.5)
WBC: 11.6 10*3/uL — ABNORMAL HIGH (ref 4.0–10.5)
nRBC: 0.3 % — ABNORMAL HIGH (ref 0.0–0.2)

## 2018-02-10 LAB — BASIC METABOLIC PANEL
Anion gap: 5 (ref 5–15)
BUN: 8 mg/dL (ref 8–23)
CO2: 25 mmol/L (ref 22–32)
Calcium: 8.3 mg/dL — ABNORMAL LOW (ref 8.9–10.3)
Chloride: 113 mmol/L — ABNORMAL HIGH (ref 98–111)
Creatinine, Ser: 0.97 mg/dL (ref 0.44–1.00)
GFR calc Af Amer: >60 mL/min (ref >60)
GFR calc non Af Amer: 55 mL/min — ABNORMAL LOW (ref >60)
Glucose, Bld: 110 mg/dL — ABNORMAL HIGH (ref 70–99)
Potassium: 3.8 mmol/L (ref 3.5–5.1)
Sodium: 143 mmol/L (ref 135–145)

## 2018-02-10 LAB — GLUCOSE, CAPILLARY: Glucose-Capillary: 86 mg/dL (ref 70–99)

## 2018-02-10 MED ORDER — AMIODARONE HCL 200 MG PO TABS
400.0000 mg | ORAL_TABLET | Freq: Once | ORAL | Status: AC
  Administered 2018-02-10: 400 mg via ORAL
  Filled 2018-02-10: qty 2

## 2018-02-10 MED ORDER — POTASSIUM CHLORIDE CRYS ER 20 MEQ PO TBCR
40.0000 meq | EXTENDED_RELEASE_TABLET | Freq: Every day | ORAL | Status: DC
  Administered 2018-02-11 – 2018-02-13 (×3): 40 meq via ORAL
  Filled 2018-02-10 (×3): qty 2

## 2018-02-10 MED ORDER — AMIODARONE HCL 200 MG PO TABS
200.0000 mg | ORAL_TABLET | Freq: Every day | ORAL | Status: DC
  Administered 2018-02-11 – 2018-02-13 (×3): 200 mg via ORAL
  Filled 2018-02-10 (×3): qty 1

## 2018-02-10 NOTE — Plan of Care (Signed)
  Problem: Elimination: Goal: Will not experience complications related to bowel motility Outcome: Progressing   Problem: Safety: Goal: Ability to remain free from injury will improve Outcome: Progressing   Problem: Skin Integrity: Goal: Risk for impaired skin integrity will decrease Outcome: Progressing   

## 2018-02-10 NOTE — NC FL2 (Signed)
[place] MEDICAID FL2 LEVEL OF CARE SCREENING TOOL     IDENTIFICATION  Patient Name: [name] Birthdate: 02/02/1941 Sex: female Admission Date (Current Location): 01/27/2018  [facility] [place]   Facility and Address:  [facility],  [address], [place] [id number]      Provider Number: [id number]  Attending Physician Name and Address:  Alba Coryegalado, Belkys A, MD  Relative Name and Phone Number:       Current Level of Care: Hospital Recommended Level of Care: Skilled Nursing Facility Prior Approval Number:    Date Approved/Denied:   PASRR Number:    Discharge Plan: SNF    Current Diagnoses: Patient Active Problem List   Diagnosis Date Noted  . Pressure injury of skin 02/04/2018  . Difficult intravenous access   . Constipation 01/27/2018  . Dementia due to Parkinson's disease with behavioral disturbance (HCC) 01/27/2018  . Hypokalemia 01/27/2018  . AKI (acute kidney injury) (HCC) 01/27/2018  . Leukocytosis 01/27/2018  . Acute on chronic respiratory failure with hypoxia (HCC) 01/27/2018  . Hyperbilirubinemia 01/27/2018  . Abdominal distention 01/27/2018  . Lower extremity weakness 09/03/2013  . Ambulatory dysfunction 09/03/2013  . Parkinson disease (HCC) 09/03/2013  . SVT (supraventricular tachycardia) (HCC) 03/23/2012  . Preop cardiovascular exam 07/03/2011  . Chest pain 04/26/2011  . Atrial fibrillation (HCC)   . Diastolic CHF (HCC)   . EDEMA 11/02/2008  . IMPAIRED GLUCOSE TOLERANCE 10/15/2008  . HYPERCHOLESTEROLEMIA 10/15/2008  . OBESITY 10/15/2008  . ANXIETY DISORDER 10/15/2008  . Essential hypertension 10/15/2008  . Coronary atherosclerosis 10/15/2008  . GASTRITIS 10/15/2008  . OSTEOARTHRITIS 10/15/2008    Orientation RESPIRATION BLADDER Height & Weight     Self  Normal Incontinent Weight: 171 lb 15.3 oz (78 kg) Height:  5\' 2"  (157.5 cm)  BEHAVIORAL SYMPTOMS/MOOD NEUROLOGICAL BOWEL NUTRITION STATUS   Incontinent Diet(see dc summary)  AMBULATORY STATUS COMMUNICATION OF NEEDS Skin   Total Care Verbally(Follows commands. Answers Yes/NO) PU Stage and Appropriate Care(Sacrum, right medial, Left Medial)                       Personal Care Assistance Level of Assistance  Total care       Total Care Assistance: Maximum assistance   Functional Limitations Info  Sight, Hearing, Speech Sight Info: Adequate Hearing Info: Impaired Speech Info: Adequate    SPECIAL CARE FACTORS FREQUENCY                       Contractures      Additional Factors Info  Code Status, Allergies Code Status Info: DNR Allergies Info: Sulfonamide Derivatives           Current Medications (02/10/2018):  This is the current hospital active medication list Current Facility-Administered Medications  Medication Dose Route Frequency Provider Last Rate Last Dose  . 0.9 %  sodium chloride infusion (Manually program via Guardrails IV Fluids)   Intravenous Once Kirby-Graham, Beather ArbourKaren J, NP      . acetaminophen (TYLENOL) tablet 650 mg  650 mg Oral Q6H PRN Marguerita MerlesSheikh, Omair BangorLatif, DO   650 mg at 01/29/18 2105   Or  . acetaminophen (TYLENOL) suppository 650 mg  650 mg Rectal Q6H PRN Marguerita MerlesSheikh, Omair Latif, DO      . amiodarone (PACERONE) tablet 400 mg  400 mg Oral BID Leone BrandIngold, Laura R, NP   400 mg at 02/10/18 0900  . aspirin EC tablet 81 mg  81 mg Oral  Daily Regalado, Belkys A, MD   81 mg at 02/10/18 0900  . atorvastatin (LIPITOR) tablet 40 mg  40 mg Oral q1800 Jodelle Redhristopher, Bridgette, MD   40 mg at 02/09/18 1705  . Chlorhexidine Gluconate Cloth 2 % PADS 6 each  6 each Topical Daily Regalado, Belkys A, MD   6 each at 02/10/18 0900  . dextrose 5 % solution   Intravenous Continuous Regalado, Belkys A, MD 50 mL/hr at 02/10/18 1100    . feeding supplement (ENSURE ENLIVE) (ENSURE ENLIVE) liquid 237 mL  237 mL Oral BID BM Regalado, Belkys A, MD   237 mL at 02/10/18 1344  . folic acid (FOLVITE) tablet 1 mg  1 mg Oral Daily  Regalado, Belkys A, MD   1 mg at 02/10/18 0900  . Gerhardt's butt cream 1 application  1 application Topical Daily PRN Sheikh, Omair Latif, DO      . levalbuterol [facility](XOPENEX) nebulizer solution 1.25 mg  1.25 mg Nebulization Q6H PRN Berton Mountgbata, Sylvester I, MD      . metoprolol tartrate (LOPRESSOR) injection 5 mg  5 mg Intravenous Q4H PRN Regalado, Belkys A, MD   2.5 mg at 02/03/18 0755  . metoprolol tartrate (LOPRESSOR) tablet 25 mg  25 mg Oral BID Leone BrandIngold, Laura R, NP   25 mg at 02/10/18 0900  . multivitamin with minerals tablet 1 tablet  1 tablet Oral Daily Regalado, Belkys A, MD   1 tablet at 02/10/18 0900  . ondansetron (ZOFRAN) tablet 4 mg  4 mg Oral Q6H PRN Marguerita MerlesSheikh, Omair Latif, DO       Or  . ondansetron [facility](ZOFRAN) injection 4 mg  4 mg Intravenous Q6H PRN Sheikh, Omair Latif, DO      . pantoprazole (PROTONIX) injection 40 mg  40 mg Intravenous Q12H Regalado, Belkys A, MD   40 mg at 02/10/18 0900  . phosphorus (K PHOS NEUTRAL) tablet 250 mg  250 mg Oral Daily Regalado, Belkys A, MD   250 mg at 02/10/18 0900  . potassium chloride SA (K-DUR,KLOR-CON) CR tablet 40 mEq  40 mEq Oral BID Regalado, Belkys A, MD   40 mEq at 02/10/18 0900  . sodium chloride flush (NS) 0.9 % injection 10-40 mL  10-40 mL Intracatheter Q12H Regalado, Belkys A, MD   10 mL at 02/10/18 0900  . sodium chloride flush (NS) 0.9 % injection 10-40 mL  10-40 mL Intracatheter PRN Regalado, Belkys A, MD      . Melene Muller[START ON 02/18/2018] Vitamin D (Ergocalciferol) (DRISDOL) capsule 50,000 Units  50,000 Units Oral Q30 days Merlene LaughterSheikh, Omair Latif, DO         Discharge Medications: Please see discharge summary for a list of discharge medications.  Relevant Imaging Results:  Relevant Lab Results:   Additional Information ssn: [id number]  Coralyn HellingBernette Chari Parmenter, LCSW

## 2018-02-10 NOTE — Progress Notes (Signed)
PROGRESS NOTE    [name]  JKK:[id number] DOB: [birth year] DOA: 01/27/2018 PCP: Nadara Eaton, MD    Brief Narrative ; 78 year old with past medical history significant for A. fib, chronic diastolic heart failure, coronary artery disease, Parkinson disease, history of CVA with right-sided weakness who was admitted with abdominal distention, poor oral intake and constipation. Patient was found to have severe abdominal distention and CT of the abdomen pelvis showed gaseous distention of the colon the very large stool burden identified within the rectum which favored a rectal impaction. Patient was found to have an AKI likely due to her poor p.o. intake.  GI was consulted for fecal impaction and colonic distention.  GI have been helping with management, GoLYTELY was ordered.  Unclear if patient has received or able to tolerate the GoLYTELY.  Patient has received multiple.  Patient was treated for hypernatremia and AKI.  The morning of 12-31, patient developed A. fib with RVR, shortness of breath.  EKG with ST depression.  Patient was also hypotensive with blood pressure in the 70s.  She received 1 L of IV bolus.  When her blood pressure improved she received a low dose metoprolol.  Cardiology was consulted.  Amiodarone was order.  Atrial fibrillation has been a stable.  Her hemoglobin continued to be at 7. GI not planning endoscopy unless hb drop. Please repeat Hemoglobin in am, if lower please consult again Dr Matthias Hughs.      Assessment & Plan:   Active Problems:   Essential hypertension   Coronary atherosclerosis   Atrial fibrillation (HCC)   Diastolic CHF (HCC)   Ambulatory dysfunction   Parkinson disease (HCC)   Constipation   Dementia due to Parkinson's disease with behavioral disturbance (HCC)   Hypokalemia   AKI (acute kidney injury) (HCC)   Leukocytosis   Acute on chronic respiratory failure with hypoxia (HCC)   Hyperbilirubinemia   Abdominal distention   Difficult  intravenous access   Pressure injury of skin   1-A. fib with RVR, Hypotension;  Patient received IV fluids.  Blood pressure increased to the 100 range.  IV metoprolol PRN was ordered. Cardiology was consulted, patient was a started on amiodarone. Patient subsequently sinus convert on her own before initiation of amiodarone. Hb trending down. Hold on start anticoagulation  Might be at high risk for bleeding.  Appreciate cardiology help/  -ECHO with preserve EF, hypokinesis of mid-inferiorlateral  wall myocardia. No cath planned.  -Rate controlled.  -Needs amiodarone 400 mg BID for 5  days, until 02-10-2018 then 200 mg daily.  I have change order to start amiodarone 200 mg daily starting 02-11-2018. HR has been stable.  Transfer to telemetry.   2-Abdominal distention; in the setting of severe constipation and fecal impaction, colonic ileus.  GI consulted and following Patient receive smog enema without any improvement. Resolved with Golytely.  KUB improved.  Resolved.   3-Anemia; Acute blood loss anemia;  Dark stool. Started  IV Protonix.  Occult blood positive.  Will continue to monitor. Received 2 unit PRBC during admission.  Anemia panel consistent with iron deficiency anemia. Gave IV iron.  Started folic acid.  Discussed with Dr Matthias Hughs on 1-06 , hb increasing. Ok to resume aspirin. Hold on further endoscopy.  Hb today has decrease to 7.7. will repeat hb tomorrow, if further drop will need to contact Dr Matthias Hughs.   4-Hypokalemia Replete orally.  Resolved. Change KCL to 40 mg daily.  Monitor.   hypophosphatemia; resolved.    5-AKI; creatinine  on admission at 3.1 Improved with IV fluids.  6-Pneumonia, leukocytosis; Question of possible aspiration. Received 10 days of antibiotics.   7-Acute hypoxic respiratory failure Suspect related to aspiration. Continue with oxygen supplement.Marland Kitchen.  8-Dementia Parkinson disease Delirium precaution. On quetiapine.  Leukocytosis;  monitor. UA negative. Trending down.   Diarrhea; improved. Will check stool culture.   Poor oral intake; hypernatremia; started  D 5 IV fluids.  Encourage patient to eat.  Nutrition consultation.  Hypernatremia, improving/  Eating more.    Nutrition Problem: Inadequate oral intake Etiology: acute illness, lethargy/confusion, other (see comment)(needs feeding assistance) Hypertension hold benazepril.     Signs/Symptoms: meal completion < 50%    Interventions: Ensure Enlive (each supplement provides 350kcal and 20 grams of protein), Magic cup, MVI  Estimated body mass index is 31.45 kg/m as calculated from the following:   Height as of this encounter: 5\' 2"  (1.575 m).   Weight as of this encounter: 78 kg.   DVT prophylaxis: heparin  Code Status: DNR Family Communication: daughter at bedside.  Disposition Plan: remain in the step down unit.    Consultants:   Cardiology  GI   Procedures:  none  Antimicrobials Zosyn   Subjective: She was agitated earlier today.  Denies abdominal pain   Objective: Vitals:   02/10/18 0800 02/10/18 0842 02/10/18 1000 02/10/18 1159  BP: (!) 104/52 (!) 104/52 (!) 94/55   Pulse: 68 67 65   Resp: (!) 21  13   Temp:    98.2 F (36.8 C)  TempSrc:    Oral  SpO2: 100%  94%   Weight:      Height:        Intake/Output Summary (Last 24 hours) at 02/10/2018 1353 Last data filed at 02/10/2018 1100 Gross per 24 hour  Intake 1128.34 ml  Output 1400 ml  Net -271.66 ml   Filed Weights   02/08/18 0500 02/09/18 0232 02/10/18 0500  Weight: 62.9 kg 80.8 kg 78 kg    Examination:  General exam: NAD Respiratory system: CTA Cardiovascular system: S 1, S 2 IRR Gastrointestinal system: BS present, soft, nt Central nervous system: ALert Extremities: trace edema Skin: no rashes.      Data Reviewed: I have personally reviewed following labs and imaging studies  CBC: Recent Labs  Lab 02/04/18 0500  02/05/18 0351 02/06/18 0500  02/07/18 0259 02/08/18 0508 02/09/18 0212 02/10/18 0323  WBC 13.7*   < > 11.7* 12.4* 12.6* 11.3* 13.8* 11.6*  NEUTROABS 10.0*  --  8.2*  --   --   --   --   --   HGB 7.7*   < > 7.5* 8.1* 7.7* 7.4* 8.1* 7.7*  HCT 24.4*   < > 24.1* 25.7* 24.3* 23.9* 26.6* 25.3*  MCV 81.6   < > 85.2 82.1 84.1 85.1 87.2 87.8  PLT 171   < > 149* 159 163 172 202 202   < > = values in this interval not displayed.   Basic Metabolic Panel: Recent Labs  Lab 02/04/18 0500 02/05/18 0351 02/06/18 0500 02/07/18 0259 02/08/18 0508 02/09/18 0212 02/10/18 0323  NA 143  144 146* 141 148* 146* 144 143  K 3.1*  3.1* 3.2* 2.5* 3.1* 3.2* 3.6 3.8  CL 120*  121* 122* 116* 120* 117* 115* 113*  CO2 18*  19* 19* 19* 21* 24 23 25   GLUCOSE 107*  108* 89 86 89 92 98 110*  BUN 18  19 15 12  7* 6* 8 8  CREATININE 0.74  0.71 0.79 0.73 0.78 0.78 0.82 0.97  CALCIUM 7.6*  7.5* 7.5* 7.5* 8.0* 8.3* 8.5* 8.3*  MG 2.2 2.1 1.9  --   --   --   --   PHOS 1.6*  1.6* 1.7* 2.8 3.4 2.7  --   --    GFR: Estimated Creatinine Clearance: 44.8 mL/min (by C-G formula based on SCr of 0.97 mg/dL). Liver Function Tests: Recent Labs  Lab 02/04/18 0500 02/05/18 0351  AST 29  --   ALT 32  --   ALKPHOS 39  --   BILITOT 0.7  --   PROT 4.5*  --   ALBUMIN 2.2*  2.3* 2.1*   No results for input(s): LIPASE, AMYLASE in the last 168 hours. No results for input(s): AMMONIA in the last 168 hours. Coagulation Profile: No results for input(s): INR, PROTIME in the last 168 hours. Cardiac Enzymes: Recent Labs  Lab 02/03/18 2044  TROPONINI 0.23*   BNP (last 3 results) No results for input(s): PROBNP in the last 8760 hours. HbA1C: No results for input(s): HGBA1C in the last 72 hours. CBG: Recent Labs  Lab 02/06/18 0734 02/07/18 1138 02/08/18 0747 02/09/18 0734 02/10/18 0731  GLUCAP 87 98 92 96 86   Lipid Profile: No results for input(s): CHOL, HDL, LDLCALC, TRIG, CHOLHDL, LDLDIRECT in the last 72 hours. Thyroid Function  Tests: No results for input(s): TSH, T4TOTAL, FREET4, T3FREE, THYROIDAB in the last 72 hours. Anemia Panel: No results for input(s): VITAMINB12, FOLATE, FERRITIN, TIBC, IRON, RETICCTPCT in the last 72 hours. Sepsis Labs: No results for input(s): PROCALCITON, LATICACIDVEN in the last 168 hours.  No results found for this or any previous visit (from the past 240 hour(s)).       Radiology Studies: No results found.      Scheduled Meds: . sodium chloride   Intravenous Once  . amiodarone  400 mg Oral BID  . aspirin EC  81 mg Oral Daily  . atorvastatin  40 mg Oral q1800  . Chlorhexidine Gluconate Cloth  6 each Topical Daily  . feeding supplement (ENSURE ENLIVE)  237 mL Oral BID BM  . folic acid  1 mg Oral Daily  . metoprolol tartrate  25 mg Oral BID  . multivitamin with minerals  1 tablet Oral Daily  . pantoprazole (PROTONIX) IV  40 mg Intravenous Q12H  . phosphorus  250 mg Oral Daily  . potassium chloride  40 mEq Oral BID  . sodium chloride flush  10-40 mL Intracatheter Q12H  . [START ON 02/18/2018] Vitamin D (Ergocalciferol)  50,000 Units Oral Q30 days   Continuous Infusions: . dextrose 50 mL/hr at 02/10/18 1100     LOS: 9 days    Time spent: 35 minutes    Alba Cory, MD [facility] Pager [phone]  If 7PM-7AM, please contact night-coverage [email or web address] Password [email or web address] 02/10/2018, 1:53 PM

## 2018-02-11 ENCOUNTER — Inpatient Hospital Stay (HOSPITAL_COMMUNITY): Payer: Medicare Other

## 2018-02-11 DIAGNOSIS — K5641 Fecal impaction: Secondary | ICD-10-CM

## 2018-02-11 DIAGNOSIS — I5032 Chronic diastolic (congestive) heart failure: Secondary | ICD-10-CM

## 2018-02-11 DIAGNOSIS — J9601 Acute respiratory failure with hypoxia: Secondary | ICD-10-CM

## 2018-02-11 DIAGNOSIS — K6389 Other specified diseases of intestine: Secondary | ICD-10-CM

## 2018-02-11 LAB — BASIC METABOLIC PANEL
Anion gap: 6 (ref 5–15)
BUN: 16 mg/dL (ref 8–23)
CO2: 25 mmol/L (ref 22–32)
Calcium: 8.6 mg/dL — ABNORMAL LOW (ref 8.9–10.3)
Chloride: 110 mmol/L (ref 98–111)
Creatinine, Ser: 1.03 mg/dL — ABNORMAL HIGH (ref 0.44–1.00)
GFR calc Af Amer: 59 mL/min — ABNORMAL LOW (ref >60)
GFR calc non Af Amer: 51 mL/min — ABNORMAL LOW (ref >60)
Glucose, Bld: 108 mg/dL — ABNORMAL HIGH (ref 70–99)
Potassium: 3.6 mmol/L (ref 3.5–5.1)
Sodium: 141 mmol/L (ref 135–145)

## 2018-02-11 LAB — CBC
HCT: 28 % — ABNORMAL LOW (ref 36.0–46.0)
Hemoglobin: 8.4 g/dL — ABNORMAL LOW (ref 12.0–15.0)
MCH: 26.9 pg (ref 26.0–34.0)
MCHC: 30 g/dL (ref 30.0–36.0)
MCV: 89.7 fL (ref 80.0–100.0)
Platelets: 227 10*3/uL (ref 150–400)
RBC: 3.12 MIL/uL — ABNORMAL LOW (ref 3.87–5.11)
RDW: 20 % — ABNORMAL HIGH (ref 11.5–15.5)
WBC: 12.7 10*3/uL — ABNORMAL HIGH (ref 4.0–10.5)
nRBC: 0 % (ref 0.0–0.2)

## 2018-02-11 LAB — GLUCOSE, CAPILLARY: Glucose-Capillary: 92 mg/dL (ref 70–99)

## 2018-02-11 LAB — PHOSPHORUS: Phosphorus: 3 mg/dL (ref 2.5–4.6)

## 2018-02-11 MED ORDER — SODIUM CHLORIDE 0.9% FLUSH
10.0000 mL | INTRAVENOUS | Status: DC | PRN
  Administered 2018-02-12: 30 mL
  Filled 2018-02-11: qty 40

## 2018-02-11 MED ORDER — BISACODYL 10 MG RE SUPP: 10.0000 mg | Freq: Every day | RECTAL | Status: AC

## 2018-02-11 MED ORDER — IOHEXOL 300 MG/ML  SOLN: 30.0000 mL | Freq: Once | INTRAMUSCULAR | Status: DC | PRN

## 2018-02-11 NOTE — Progress Notes (Signed)
Patient has been putting out large amounts of clear mucous discharge from her rectum. Does not c/o any pain. VSS. MD aware. Will continue to monitor.

## 2018-02-11 NOTE — Progress Notes (Signed)
Pt. refusing to drink oral contrast for CT scan abdomen. Pt. Has only central line for IV access. Pt. Has been a difficult stick. IV team consult placed, to try to get a PIV. MD aware.

## 2018-02-11 NOTE — Evaluation (Signed)
Clinical/Bedside Swallow Evaluation Patient Details  Name: [name] MRN: [id number] Date of Birth: 11/22/1940  Today's Date: 02/11/2018 Time: SLP Start Time (ACUTE ONLY): 1117 SLP Stop Time (ACUTE ONLY): 1130 SLP Time Calculation (min) (ACUTE ONLY): 13 min  Past Medical History:  Past Medical History:  Diagnosis Date  . Anxiety disorder   . Atrial fibrillation (HCC)    a. new 04/2011;  b. Pradaxa  . Chronic diastolic heart failure (HCC)    Echocardiogram 04/14/11: Moderate LVH, EF 60-65%, mild LAE.  Marland Kitchen. 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 Gastritis   . H/O: hysterectomy   . Heart murmur    from age 78 associated with some hemoptysis  . Hypercholesteremia   . Hypertension   . Impaired glucose tolerance   . Obesity   . Osteoarthritis   . Parkinson's disease (HCC)    patient denies having parkinsons  . Stroke [facility](HCC) 2007   Residual R sided weakness, uses a wheelchair, walker at home   Past Surgical History:  Past Surgical History:  Procedure Laterality Date  . CARPAL TUNNEL RELEASE     right  . 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 LEFT HEART CATHETERIZATION WITH CORONARY ANGIOGRAM N/A 05/03/2011   Procedure: LEFT HEART CATHETERIZATION WITH CORONARY ANGIOGRAM;  Surgeon: Herby Abrahamhomas D Stuckey, MD;  [facility];  Service: Cardiovascular;  Laterality: N/A;  . REPLACEMENT TOTAL KNEE     right  . TONSILLECTOMY     HPI:      Assessment / Plan / Recommendation Clinical Impression  Pt demonstrated no focal CN deficits, unable to follow commands at baseline.  There were no overt s/s of aspiration despite large, successive boluses of thin and pt helped to hold the cup with max assist.  Pt also provided with icecream and graham crackers.  She is able to hold her own cracker with assist for placement - thus recommend continue full supervision but have pt help to self  feed to maximize airway protection.  Still, pts who are dependent on others for feeding and oral care are at the highest risk for developing aspiration pnas.  Today, pt appeared to protect her airway well when seated upright and offered careful handfeeding.  Recommend soft finger foods to allow ease of self feeding.   SLP Visit Diagnosis: Dysphagia, unspecified (R13.10)    Aspiration Risk  Mild aspiration risk    Diet Recommendation Regular(soft finger foods)   Liquid Administration via: Cup;Straw Medication Administration: Whole meds with liquid(as tolerated) Supervision: Full supervision/cueing for compensatory strategies Compensations: Slow rate;Small sips/bites    Other  Recommendations Oral Care Recommendations: Oral care BID   Follow up Recommendations None      Frequency and Duration     n/a       Prognosis n/a   Swallow Study   General      Oral/Motor/Sensory Function Overall Oral Motor/Sensory Function: (symmetry at baseline)   Ice Chips Ice chips: Not tested   Thin Liquid Thin Liquid: Within functional limits Presentation: Cup;Straw Other Comments: pt assisted to hold cup    Nectar Thick Nectar Thick Liquid: Not tested   Honey Thick Honey Thick Liquid: Not tested   Puree Puree: Within functional limits Presentation: Spoon Other Comments: ICE CREAM   Solid     Solid: Impaired(graham cracker - ) Presentation: Self Fed Oral Phase  Impairments: Reduced lingual movement/coordination Other Comments: appeared with adequate mastication without residuals - despite not having dentition      Chales Abrahams 02/11/2018,1:59 PM    Donavan Burnet, MS [facility] SLP Acute Rehab Services Pager [phone] Office [phone]

## 2018-02-11 NOTE — Progress Notes (Addendum)
PROGRESS NOTE  [name] EOF:[id number] DOB: 1940-04-17 DOA: 01/27/2018 PCP: Nadara Eaton, MD  HPI/Recap of past 24 hours:  Demented elderly, does not follow commands, does not have meaningful conversation   Large amount of thick mucus with odor coming out from her rectum, mucus is nonblody,  she does not appear in pain, no fever  She refused oral contrast for ct , she refused to have iv placed for iv contrast  Assessment/Plan: Active Problems:   Essential hypertension   Coronary atherosclerosis   Atrial fibrillation (HCC)   Diastolic CHF (HCC)   Ambulatory dysfunction   Parkinson disease (HCC)   Constipation   Dementia due to Parkinson's disease with behavioral disturbance (HCC)   Hypokalemia   AKI (acute kidney injury) (HCC)   Leukocytosis   Acute on chronic respiratory failure with hypoxia (HCC)   Hyperbilirubinemia   Abdominal distention   Difficult intravenous access   Pressure injury of skin   Abdominal distention; in the setting of severe constipation and fecal impaction, colonic ileus (presenting symptom) -Patient receive smog enema without any improvement. Had bm with Golytely.  -gi consulted -had large amount rectal mucus on 1/8 am, Case discussed with GI Dr Chapman Moss who recommend kub, kub concerning if sigmoid vulvulus, ct ab/pel ( not able to get iv contrast or oral contrast due to lack of iv access and patient refusing to have iv placed, patient refusing oral contrast) pending for gi recommendation, -keep npo aftermidnight in case needing sigmoid scope  Anemia; Acute blood loss anemia;  Dark stool. Occult blood positive.   On IV Protonix. Anemia panel consistent with iron deficiency anemia. s/p IV iron.   Received 2 unit PRBC during admission.  Started folic acid.  No more sign of bleeding, hgb has been stable Gi [name] not plan to scope if hgb stable  PAF  With afib/RVR /hypotension on presentation, she received ivf, iv metoprolol Patient  subsequently sinus convert on her own before initiation of amiodarone. ECHO with preserve EF, hypokinesis of mid-inferiorlateral  wall myocardia. No cath planned Cardiology consulted, she is started on amiodarone 400 mg BID for 5  days then amiodarone 200mg  daily from 1/8 -she is continued on betablocker has been in sinus rhythm -she is taken off anticoagulation in 2015 due to gi bleed.   Chronic diastolic chf Close monitor volume status  Acute hypoxic respiratory failure likely due to aspiration pneumonia -she finished 10days of abx, hypoxia resolved -s/p swallow eval "Recommend soft finger foods to allow ease of self feeding. "  Leukocytosis Wbc on presentation was 18  today at 12.7, continue monitor , no fever  AKI on CKDII Cr  on presentation was 3.13, cr now 1  Hypokalemia: replace to keep k>4, check mag  Calcified splenic mass, incidental finding on CT  benign hemorrhagic right , incidental finding on CT   CVA with right-sided weakness, vascular dementia, total care from SNF On quetiapine qhs which is continued  Code Status: DNR  Family Communication: patient   Disposition Plan: not ready to discharge   Consultants:  [facility] DR Buccini  cardiology  Procedures:  none  Antibiotics:  As above   Objective: BP (!) 105/55   Pulse 67   Temp 97.6 F (36.4 C) (Axillary)   Resp 16   Ht 5\' 2"  (1.575 m)   Wt 77.7 kg   SpO2 99%   BMI 31.33 kg/m   Intake/Output Summary (Last 24 hours) at 02/11/2018 0932 Last data filed at 02/11/2018  0800 Gross per 24 hour  Intake 1409.92 ml  Output 1400 ml  Net 9.92 ml   Filed Weights   02/09/18 0232 02/10/18 0500 02/11/18 0438  Weight: 80.8 kg 78 kg 77.7 kg    Exam: Patient is examined daily including today on 02/11/2018, exams remain the same as of yesterday except that has changed    General:  Frail, demented elderly, left eye ptosis ( chronic)  Cardiovascular: RRR  Respiratory: diminished at basis  Abdomen:  Soft/ND/NT, positive BS  Musculoskeletal: No Edema  Neuro: alert, not following commands, not making meaningful conversation  Data Reviewed: Basic Metabolic Panel: Recent Labs  Lab 02/05/18 0351 02/06/18 0500 02/07/18 0259 02/08/18 0508 02/09/18 0212 02/10/18 0323 02/11/18 0442  NA 146* 141 148* 146* 144 143 141  K 3.2* 2.5* 3.1* 3.2* 3.6 3.8 3.6  CL 122* 116* 120* 117* 115* 113* 110  CO2 19* 19* 21* 24 23 25 25   GLUCOSE 89 86 89 92 98 110* 108*  BUN 15 12 7* 6* 8 8 16   CREATININE 0.79 0.73 0.78 0.78 0.82 0.97 1.03*  CALCIUM 7.5* 7.5* 8.0* 8.3* 8.5* 8.3* 8.6*  MG 2.1 1.9  --   --   --   --   --   PHOS 1.7* 2.8 3.4 2.7  --   --  3.0   Liver Function Tests: Recent Labs  Lab 02/05/18 0351  ALBUMIN 2.1*   No results for input(s): LIPASE, AMYLASE in the last 168 hours. No results for input(s): AMMONIA in the last 168 hours. CBC: Recent Labs  Lab 02/05/18 0351  02/07/18 0259 02/08/18 0508 02/09/18 0212 02/10/18 0323 02/11/18 0442  WBC 11.7*   < > 12.6* 11.3* 13.8* 11.6* 12.7*  NEUTROABS 8.2*  --   --   --   --   --   --   HGB 7.5*   < > 7.7* 7.4* 8.1* 7.7* 8.4*  HCT 24.1*   < > 24.3* 23.9* 26.6* 25.3* 28.0*  MCV 85.2   < > 84.1 85.1 87.2 87.8 89.7  PLT 149*   < > 163 172 202 202 227   < > = values in this interval not displayed.   Cardiac Enzymes:   No results for input(s): CKTOTAL, CKMB, CKMBINDEX, TROPONINI in the last 168 hours. BNP (last 3 results) No results for input(s): BNP in the last 8760 hours.  ProBNP (last 3 results) No results for input(s): PROBNP in the last 8760 hours.  CBG: Recent Labs  Lab 02/06/18 0734 02/07/18 1138 02/08/18 0747 02/09/18 0734 02/10/18 0731  GLUCAP 87 98 92 96 86    No results found for this or any previous visit (from the past 240 hour(s)).   Studies: No results found.  Scheduled Meds: . sodium chloride   Intravenous Once  . amiodarone  200 mg Oral Daily  . aspirin EC  81 mg Oral Daily  . atorvastatin  40  mg Oral q1800  . Chlorhexidine Gluconate Cloth  6 each Topical Daily  . feeding supplement (ENSURE ENLIVE)  237 mL Oral BID BM  . folic acid  1 mg Oral Daily  . metoprolol tartrate  25 mg Oral BID  . multivitamin with minerals  1 tablet Oral Daily  . pantoprazole (PROTONIX) IV  40 mg Intravenous Q12H  . phosphorus  250 mg Oral Daily  . potassium chloride  40 mEq Oral Daily  . sodium chloride flush  10-40 mL Intracatheter Q12H  . [START ON 02/18/2018] Vitamin D (  Ergocalciferol)  50,000 Units Oral Q30 days    Continuous Infusions: . dextrose 50 mL/hr at 02/11/18 0800     Time spent: , case discussed with GI Dr Matthias Hughs  I have personally reviewed and interpreted on  02/11/2018 daily labs, tele strips, imagings as discussed above under date review session and assessment and plans.  I reviewed all nursing notes, pharmacy notes, consultant notes,  vitals, pertinent old records  I have discussed plan of care as described above with RN , patient  on 02/11/2018   Albertine Grates MD, PhD  [facility] Pager [phone]. If 7PM-7AM, please contact night-coverage at [email or web address], password [email or web address] 02/11/2018, 9:32 AM  LOS: 10 days

## 2018-02-11 NOTE — Progress Notes (Signed)
At this time patient is refusing PIV attempt for CT scan. There is a family member in the room who attempted to help pursued the patient as well and the patient still refused. MD Parke Poisson notified via telephone. RN Sapna notified as well

## 2018-02-12 DIAGNOSIS — K5909 Other constipation: Secondary | ICD-10-CM

## 2018-02-12 LAB — CBC WITH DIFFERENTIAL/PLATELET
Abs Immature Granulocytes: 0.08 10*3/uL — ABNORMAL HIGH (ref 0.00–0.07)
Basophils Absolute: 0 10*3/uL (ref 0.0–0.1)
Basophils Relative: 0 %
Eosinophils Absolute: 0.3 10*3/uL (ref 0.0–0.5)
Eosinophils Relative: 3 %
HCT: 26 % — ABNORMAL LOW (ref 36.0–46.0)
Hemoglobin: 7.8 g/dL — ABNORMAL LOW (ref 12.0–15.0)
Immature Granulocytes: 1 %
Lymphocytes Relative: 13 %
Lymphs Abs: 1.4 10*3/uL (ref 0.7–4.0)
MCH: 26.5 pg (ref 26.0–34.0)
MCHC: 30 g/dL (ref 30.0–36.0)
MCV: 88.4 fL (ref 80.0–100.0)
Monocytes Absolute: 0.7 10*3/uL (ref 0.1–1.0)
Monocytes Relative: 7 %
Neutro Abs: 8 10*3/uL — ABNORMAL HIGH (ref 1.7–7.7)
Neutrophils Relative %: 76 %
Platelets: 223 10*3/uL (ref 150–400)
RBC: 2.94 MIL/uL — ABNORMAL LOW (ref 3.87–5.11)
RDW: 20.1 % — ABNORMAL HIGH (ref 11.5–15.5)
WBC: 10.4 10*3/uL (ref 4.0–10.5)
nRBC: 0 % (ref 0.0–0.2)

## 2018-02-12 LAB — BASIC METABOLIC PANEL
Anion gap: 6 (ref 5–15)
BUN: 14 mg/dL (ref 8–23)
CO2: 27 mmol/L (ref 22–32)
Calcium: 8.3 mg/dL — ABNORMAL LOW (ref 8.9–10.3)
Chloride: 111 mmol/L (ref 98–111)
Creatinine, Ser: 0.92 mg/dL (ref 0.44–1.00)
GFR calc Af Amer: >60 mL/min (ref >60)
GFR calc non Af Amer: 59 mL/min — ABNORMAL LOW (ref >60)
Glucose, Bld: 89 mg/dL (ref 70–99)
Potassium: 3 mmol/L — ABNORMAL LOW (ref 3.5–5.1)
Sodium: 144 mmol/L (ref 135–145)

## 2018-02-12 LAB — GLUCOSE, CAPILLARY: Glucose-Capillary: 91 mg/dL (ref 70–99)

## 2018-02-12 LAB — MAGNESIUM: Magnesium: 1.7 mg/dL (ref 1.7–2.4)

## 2018-02-12 MED ORDER — POTASSIUM CHLORIDE CRYS ER 20 MEQ PO TBCR
40.0000 meq | EXTENDED_RELEASE_TABLET | Freq: Once | ORAL | Status: AC
  Administered 2018-02-12: 40 meq via ORAL
  Filled 2018-02-12: qty 2

## 2018-02-12 MED ORDER — MAGNESIUM SULFATE IN D5W 1-5 GM/100ML-% IV SOLN
1.0000 g | Freq: Once | INTRAVENOUS | Status: AC
  Administered 2018-02-12: 1 g via INTRAVENOUS
  Filled 2018-02-12: qty 100

## 2018-02-12 MED ORDER — POLYETHYLENE GLYCOL 3350 17 G PO PACK
17.0000 g | PACK | Freq: Every day | ORAL | Status: DC
  Administered 2018-02-12 – 2018-02-13 (×2): 17 g via ORAL
  Filled 2018-02-12 (×2): qty 1

## 2018-02-12 MED ORDER — SENNOSIDES-DOCUSATE SODIUM 8.6-50 MG PO TABS
1.0000 | ORAL_TABLET | Freq: Two times a day (BID) | ORAL | Status: DC
  Administered 2018-02-12 – 2018-02-13 (×2): 1 via ORAL
  Filled 2018-02-12 (×3): qty 1

## 2018-02-12 MED ORDER — METOPROLOL TARTRATE 12.5 MG HALF TABLET
12.5000 mg | ORAL_TABLET | Freq: Two times a day (BID) | ORAL | Status: DC
  Administered 2018-02-12 – 2018-02-13 (×2): 12.5 mg via ORAL
  Filled 2018-02-12 (×2): qty 1

## 2018-02-12 NOTE — Progress Notes (Signed)
PROGRESS NOTE  [name] KinsBarbara J [name] ZOX:[id number] DOB: 09/17/1940 DOA: 01/27/2018 PCP: Nadara EatonPiazza, Michael J, MD  HPI/Recap of past 24 hours:  bp low normal, betablocker held this am  She is alert, pleasantly confused, smiles and saying yes to all questions   Assessment/Plan: Active Problems:   Essential hypertension   Coronary atherosclerosis   PAF (paroxysmal atrial fibrillation) (HCC)   Diastolic CHF (HCC)   Ambulatory dysfunction   Parkinson disease (HCC)   Constipation   Dementia due to Parkinson's disease with behavioral disturbance (HCC)   Hypokalemia   AKI (acute kidney injury) (HCC)   Leukocytosis   Acute on chronic respiratory failure with hypoxia (HCC)   Hyperbilirubinemia   Abdominal distention   Difficult intravenous access   Pressure injury of skin   Colon distention   Fecal impaction (HCC)   Acute hypoxemic respiratory failure (HCC)  Abdominal distention; in the setting of severe constipation and fecal impaction, colonic ileus (presenting symptom) -Patient receive smog enema without any improvement. Had bm with Golytely.  -gi consulted -had large amount rectal mucus on 1/8 am, Case discussed with GI Dr Chapman MossBuccine who recommend kub, kub concerning if sigmoid vulvulus, ct ab/pel ( not able to get iv contrast or oral contrast due to lack of iv access and patient refusing to have iv placed, patient refusing oral contrast) , no acute findings -resume diet  Anemia; Acute blood loss anemia;  Dark stool. Occult blood positive.  On IV Protonix. Anemia panel consistent with iron deficiency anemia. s/p IV iron.   Received 2 unit PRBC during admission.  Started folic acid.  No more sign of bleeding, hgb has been stable Gi do not plan to scope if hgb stable  PAF  With afib/RVR /hypotension on presentation, she received ivf, iv metoprolol Patient subsequently sinus convert on her own before initiation of amiodarone. ECHO with preserve EF, hypokinesis of  mid-inferiorlateral wall myocardia. No cath planned Cardiology consulted, she is started on amiodarone 400 mg BID for 5 days then amiodarone 200mg  daily from 1/8 -she is continued on betablocker, dose reduced on 1/9 due to low normal bp - has been in sinus rhythm -she is taken off anticoagulation in 2015 due to gi bleed.   Chronic diastolic chf Close monitor volume status She started to eat more, d/c ivf  Acute hypoxic respiratory failure likely due to aspiration pneumonia -she finished 10days of abx, hypoxia resolved -s/p swallow eval "Recommend soft finger foods to allow ease of self feeding. "  Leukocytosis Wbc on presentation was 18 Wbc normalized on 1/9, continue monitor , no fever  AKI on CKDII Cr  on presentation was 3.13, cr now 0.9  Hypokalemia/hypomagnesemia:  Remain low , continue to replace to keep k>4,  mag>2  Calcified splenic mass, incidental finding on CT  benign hemorrhagic right , incidental finding on CT   CVA with right-sided weakness, vascular dementia, total care from SNF On quetiapine qhs which is continued  Code Status: DNR  Family Communication: patient   Disposition Plan: hopefully able to return to snf in am   Consultants:  [facility] DR Buccini  cardiology  Procedures:  none  Antibiotics:  As above     Objective: BP (!) 92/52   Pulse 75   Temp 98.8 F (37.1 C) (Oral)   Resp 16   Ht 5\' 2"  (1.575 m)   Wt 78.2 kg Comment: standing scale not working properly   SpO2 99%   BMI 31.53 kg/m   Intake/Output Summary (Last  24 hours) at 02/12/2018 1028 Last data filed at 02/12/2018 [id number] Gross per 24 hour  Intake 830.13 ml  Output 850 ml  Net -19.87 ml   Filed Weights   02/11/18 0438 02/11/18 2214 02/12/18 [id number]  Weight: 77.7 kg 78.2 kg 78.2 kg    Exam: Patient is examined daily including today on 02/12/2018, exams remain the same as of yesterday except that has changed    General:  Frail, demented elderly, left  eye ptosis ( chronic)  Cardiovascular: RRR  Respiratory: diminished at basis  Abdomen: Soft/ND/NT, positive BS  Musculoskeletal: No Edema  Neuro: alert, not following commands, not making meaningful conversation   Data Reviewed: Basic Metabolic Panel: Recent Labs  Lab 02/06/18 0500 02/07/18 0259 02/08/18 0508 02/09/18 0212 02/10/18 0323 02/11/18 0442 02/12/18 0254  NA 141 148* 146* 144 143 141 144  K 2.5* 3.1* 3.2* 3.6 3.8 3.6 3.0*  CL 116* 120* 117* 115* 113* 110 111  CO2 19* 21* 24 23 25 25 27   GLUCOSE 86 89 92 98 110* 108* 89  BUN 12 7* 6* 8 8 16 14   CREATININE 0.73 0.78 0.78 0.82 0.97 1.03* 0.92  CALCIUM 7.5* 8.0* 8.3* 8.5* 8.3* 8.6* 8.3*  MG 1.9  --   --   --   --   --  1.7  PHOS 2.8 3.4 2.7  --   --  3.0  --    Liver Function Tests: No results for input(s): AST, ALT, ALKPHOS, BILITOT, PROT, ALBUMIN in the last 168 hours. No results for input(s): LIPASE, AMYLASE in the last 168 hours. No results for input(s): AMMONIA in the last 168 hours. CBC: Recent Labs  Lab 02/08/18 0508 02/09/18 0212 02/10/18 0323 02/11/18 0442 02/12/18 0254  WBC 11.3* 13.8* 11.6* 12.7* 10.4  NEUTROABS  --   --   --   --  8.0*  HGB 7.4* 8.1* 7.7* 8.4* 7.8*  HCT 23.9* 26.6* 25.3* 28.0* 26.0*  MCV 85.1 87.2 87.8 89.7 88.4  PLT 172 202 202 227 223   Cardiac Enzymes:   No results for input(s): CKTOTAL, CKMB, CKMBINDEX, TROPONINI in the last 168 hours. BNP (last 3 results) No results for input(s): BNP in the last 8760 hours.  ProBNP (last 3 results) No results for input(s): PROBNP in the last 8760 hours.  CBG: Recent Labs  Lab 02/07/18 1138 02/08/18 0747 02/09/18 0734 02/10/18 0731 02/11/18 0839  GLUCAP 98 92 96 86 92    No results found for this or any previous visit (from the past 240 hour(s)).   Studies: Ct Abdomen Pelvis Wo Contrast  Result Date: 02/11/2018 CLINICAL DATA:  Sigmoid volvulus, rectal discharge EXAM: CT ABDOMEN AND PELVIS WITHOUT CONTRAST TECHNIQUE:  Multidetector CT imaging of the abdomen and pelvis was performed following the standard protocol without IV contrast. COMPARISON:  Abdominal radiograph dated 02/11/2018. CT abdomen/pelvis dated 01/27/2018. FINDINGS: Motion degraded images. Lower chest: Small right pleural effusion. Trace left pleural effusion. Eventration of the left hemidiaphragm. Mild dependent atelectasis at the right lung base. Hepatobiliary: Unenhanced liver is unremarkable. Gallbladder is unremarkable. No intrahepatic or extrahepatic ductal dilatation. Pancreas: Within normal limits. Spleen: Stable 5.0 cm calcified splenic lesion (series 2/image 25), unchanged from 2015, benign. Adrenals/Urinary Tract: Adrenal glands are within normal limits. 13 mm hyperdense/hemorrhagic lesion along the posterior right lower kidney (series 2/image 45), favoring a benign hemorrhagic cyst. Left kidney is within normal limits. No renal calculi or hydronephrosis. Bladder is within normal limits. Stomach/Bowel: Stomach is within normal limits. No evidence  of bowel obstruction. Appendix is not discretely visualized. Mildly distended, gas-filled nondependent sigmoid colon (series 2/image 37), without evidence of sigmoid volvulus or colonic obstruction. Overall colonic distension is improved from prior. Rectal impaction is also improved/resolved. Vascular/Lymphatic: No evidence of abdominal aortic aneurysm. Atherosclerotic calcifications of the abdominal aorta and branch vessels. No suspicious abdominopelvic lymphadenopathy. Reproductive: Status post hysterectomy. Bilateral ovaries are within normal limits. Other: No abdominopelvic ascites. Musculoskeletal: Degenerative changes of the visualized thoracolumbar spine. Mildly heterogeneous osseous sclerosis, chronic. IMPRESSION: No evidence of sigmoid volvulus or colonic obstruction. Mildly distended, gas-filled nondependent sigmoid colon. However, overall colonic distension is improved from prior. Rectal impaction is  also improved/resolved. Additional stable ancillary findings as above. Electronically Signed   By: Charline Bills M.D.   On: 02/11/2018 17:52   Dg Abd 1 View  Result Date: 02/11/2018 CLINICAL DATA:  Rectal discharge EXAM: ABDOMEN - 1 VIEW COMPARISON:  02/05/2018 FINDINGS: Scattered large and small bowel gas is noted. This has increased somewhat particularly in the sigmoid colon when compared with the prior exam. No free air is seen. Splenic calcifications are noted. Fecal material is noted within the transverse and descending colons. IMPRESSION: Retained fecal material is noted within the transverse and descending colons. Prominence of the sigmoid is seen increased from the prior exam. It would be difficult to exclude a sigmoid volvulus on the basis of this exam. CT imaging may be helpful for further evaluation. Electronically Signed   By: Alcide Clever M.D.   On: 02/11/2018 13:15    Scheduled Meds: . sodium chloride   Intravenous Once  . amiodarone  200 mg Oral Daily  . aspirin EC  81 mg Oral Daily  . atorvastatin  40 mg Oral q1800  . bisacodyl  10 mg Rectal Daily  . Chlorhexidine Gluconate Cloth  6 each Topical Daily  . feeding supplement (ENSURE ENLIVE)  237 mL Oral BID BM  . folic acid  1 mg Oral Daily  . metoprolol tartrate  12.5 mg Oral BID  . multivitamin with minerals  1 tablet Oral Daily  . pantoprazole (PROTONIX) IV  40 mg Intravenous Q12H  . phosphorus  250 mg Oral Daily  . polyethylene glycol  17 g Oral Daily  . potassium chloride  40 mEq Oral Daily  . potassium chloride  40 mEq Oral Once  . senna-docusate  1 tablet Oral BID  . sodium chloride flush  10-40 mL Intracatheter Q12H  . [START ON 02/18/2018] Vitamin D (Ergocalciferol)  50,000 Units Oral Q30 days    Continuous Infusions: . dextrose 50 mL/hr at 02/11/18 2229  . magnesium sulfate 1 - 4 g bolus IVPB       Time spent: I have personally reviewed and interpreted on  02/12/2018 daily labs, imagings as discussed  above under date review session and assessment and plans.  I reviewed all nursing notes, pharmacy notes, consultant notes,  vitals, pertinent old records  I have discussed plan of care as described above with RN , patient on 02/12/2018   Albertine Grates MD, PhD  [facility] Pager [phone]. If 7PM-7AM, please contact night-coverage at [email or web address], password [facility] 02/12/2018, 10:28 AM  LOS: 11 days

## 2018-02-12 NOTE — Care Management Important Message (Signed)
Important Message  Patient Details  Name: [name] MRN: [id number] Date of Birth: 07/10/1940   Medicare Important Message Given:  Yes    Caren Macadam 02/12/2018, 11:00 AMImportant Message  Patient Details  Name: [name] MRN: [id number] Date of Birth: Jun 02, 1940   Medicare Important Message Given:  Yes    Caren Macadam 02/12/2018, 10:59 AM

## 2018-02-12 NOTE — Progress Notes (Signed)
Pt is increasingly confused this evening. Pt was found pulling at IJ. IJ dressing had just been redressed by IV team RN. New dressing is now reinforced but antimicrobial disc is not in place. MD paged w/ request to remove IJ. PIV has been inserted and infusing. Mittens have also been applied to pt to prevent pulling at IV line. Will continue to monitor pt.

## 2018-02-13 LAB — CORTISOL: Cortisol, Plasma: 14 ug/dL

## 2018-02-13 LAB — BASIC METABOLIC PANEL
Anion gap: 8 (ref 5–15)
BUN: 13 mg/dL (ref 8–23)
CO2: 23 mmol/L (ref 22–32)
Calcium: 8.2 mg/dL — ABNORMAL LOW (ref 8.9–10.3)
Chloride: 113 mmol/L — ABNORMAL HIGH (ref 98–111)
Creatinine, Ser: 0.93 mg/dL (ref 0.44–1.00)
GFR calc Af Amer: >60 mL/min (ref >60)
GFR calc non Af Amer: 58 mL/min — ABNORMAL LOW (ref >60)
Glucose, Bld: 87 mg/dL (ref 70–99)
Potassium: 3.7 mmol/L (ref 3.5–5.1)
Sodium: 144 mmol/L (ref 135–145)

## 2018-02-13 LAB — MAGNESIUM: Magnesium: 2 mg/dL (ref 1.7–2.4)

## 2018-02-13 LAB — GLUCOSE, CAPILLARY: Glucose-Capillary: 86 mg/dL (ref 70–99)

## 2018-02-13 MED ORDER — MAGNESIUM OXIDE 400 MG PO TABS: 400.0000 mg | ORAL_TABLET | Freq: Every day | ORAL | 0 refills | Status: DC

## 2018-02-13 MED ORDER — AMIODARONE HCL 200 MG PO TABS: 200.0000 mg | ORAL_TABLET | Freq: Every day | ORAL | 0 refills | Status: DC

## 2018-02-13 MED ORDER — BISACODYL 10 MG RE SUPP: 10.0000 mg | RECTAL | 0 refills | Status: DC

## 2018-02-13 MED ORDER — K PHOS MONO-SOD PHOS DI & MONO 155-852-130 MG PO TABS: 250.0000 mg | ORAL_TABLET | ORAL | 0 refills | Status: DC

## 2018-02-13 MED ORDER — ADULT MULTIVITAMIN W/MINERALS CH: 1.0000 | ORAL_TABLET | Freq: Every day | ORAL | 0 refills | Status: DC

## 2018-02-13 MED ORDER — SENNOSIDES-DOCUSATE SODIUM 8.6-50 MG PO TABS: 1.0000 | ORAL_TABLET | Freq: Two times a day (BID) | ORAL | 0 refills | Status: AC

## 2018-02-13 MED ORDER — ATORVASTATIN CALCIUM 40 MG PO TABS: 40.0000 mg | ORAL_TABLET | Freq: Every day | ORAL | 0 refills | Status: DC

## 2018-02-13 MED ORDER — PANTOPRAZOLE SODIUM 40 MG PO TBEC: 40.0000 mg | DELAYED_RELEASE_TABLET | Freq: Every day | ORAL | 0 refills | Status: DC

## 2018-02-13 MED ORDER — FERROUS SULFATE 325 (65 FE) MG PO TABS: 325.0000 mg | ORAL_TABLET | ORAL | 0 refills | Status: DC

## 2018-02-13 MED ORDER — FOLIC ACID 1 MG PO TABS: 1.0000 mg | ORAL_TABLET | Freq: Every day | ORAL | 0 refills | Status: DC

## 2018-02-13 MED ORDER — METOPROLOL TARTRATE 25 MG PO TABS: 12.5000 mg | ORAL_TABLET | Freq: Two times a day (BID) | ORAL | 0 refills | Status: AC

## 2018-02-13 MED ORDER — ENSURE ENLIVE PO LIQD: 237.0000 mL | Freq: Two times a day (BID) | ORAL | 12 refills | Status: DC

## 2018-02-13 MED ORDER — PANTOPRAZOLE SODIUM 40 MG PO TBEC
40.0000 mg | DELAYED_RELEASE_TABLET | Freq: Two times a day (BID) | ORAL | Status: DC
  Administered 2018-02-13: 40 mg via ORAL
  Filled 2018-02-13: qty 1

## 2018-02-13 MED ORDER — POTASSIUM CHLORIDE CRYS ER 20 MEQ PO TBCR: 40.0000 meq | EXTENDED_RELEASE_TABLET | Freq: Every day | ORAL | 0 refills | Status: DC

## 2018-02-13 NOTE — Care Management Note (Signed)
Case Management Note  Patient Details  Name: [name] MRN: [id number] Date of Birth: 07-25-1940  Subjective/Objective:                  discharged  Action/Plan: To return to snf/ csw following  Expected Discharge Date:  02/13/18               Expected Discharge Plan:  Skilled Nursing Facility  In-House Referral:  Clinical Social Work  Discharge planning Services  CM Consult  Post Acute Care Choice:    Choice offered to:     DME Arranged:    DME Agency:     HH Arranged:    HH Agency:     Status of Service:  Completed, signed off  If discussed at [employer], dates discussed:    Additional Comments:  Golda Acre, RN 02/13/2018, 9:53 AM

## 2018-02-13 NOTE — Progress Notes (Signed)
Patient has discharged to SNF. Discharge package was sent with [facility] when patient will be transferred back to SNF. SW is awared.

## 2018-02-13 NOTE — Progress Notes (Signed)
PT Cancellation Note  Patient Details Name: [name] MRN: [id number] DOB: 07-18-1940   Cancelled Treatment:    Reason Eval/Treat Not Completed: Other (comment);PT screened, no needs identified, will sign off; Pt is total care at baseline, from SNF--see note from 1/6; Pt is also extremely confused today, attempting to pull IJ, now in mittens; PT signing off again, does not appear this pt is a rehab candidate, would defer re-eval to SNF;    [facility] 02/13/2018, 9:09 AM

## 2018-02-13 NOTE — Progress Notes (Signed)
LCSW following for dc planning.   Patient returning to [facility].   LCSW confirmed return with facility.  LCSW faxed dc docs.   LCSW notified daughter Channel.   Patient will transport by [facility].   RN report #: [phone] Please call daughter when [facility] picks up patient   BKJ

## 2018-02-13 NOTE — Discharge Summary (Addendum)
Discharge Summary  [name] VWU:[id number] DOB: [birth year]  PCP: Nadara Eaton, MD  Admit date: 01/27/2018 Discharge date: 02/13/2018  Time spent: , more than 50% time spent on coordination of care.  Recommendations for Outpatient Follow-up:  1. F/u with SNF MD for hospital discharge follow up, repeat cbc/bmp at follow up. SNF MD to follow up on am cortisol level, result pending at discharge. 2. F/u with cardiology on 1/30 3. Palliative care to follow the patient at SNF   Discharge Diagnoses:  Active Hospital Problems   Diagnosis Date Noted  . Hypomagnesemia   . Colon distention   . Fecal impaction (HCC)   . Acute hypoxemic respiratory failure (HCC)   . Pressure injury of skin 02/04/2018  . Difficult intravenous access   . Constipation 01/27/2018  . Dementia due to Parkinson's disease with behavioral disturbance (HCC) 01/27/2018  . Hypokalemia 01/27/2018  . AKI (acute kidney injury) (HCC) 01/27/2018  . Leukocytosis 01/27/2018  . Acute on chronic respiratory failure with hypoxia (HCC) 01/27/2018  . Hyperbilirubinemia 01/27/2018  . Abdominal distention 01/27/2018  . Ambulatory dysfunction 09/03/2013  . Parkinson disease (HCC) 09/03/2013  . PAF (paroxysmal atrial fibrillation) (HCC)   . Diastolic CHF (HCC)   . Coronary atherosclerosis 10/15/2008  . Essential hypertension 10/15/2008    Resolved Hospital Problems  No resolved problems to display.    Discharge Condition: stable  Diet recommendation: patient needs to be fed to ensure adequate oral intake  Regular(soft finger foods)   Liquid Administration via: Cup;Straw Medication Administration: Whole meds with liquid(as tolerated) Supervision: Full supervision/cueing for compensatory strategies Compensations: Slow rate;Small sips/bites    Filed Weights   02/11/18 2214 02/12/18 0637 02/13/18 0549  Weight: 78.2 kg 78.2 kg 77.6 kg    History of present illness: (per Admitting MD Dr Marland Mcalpine) PCP:  Nadara Eaton, MD   Patient coming from: SNF  Chief Complaint: Abdominal Distention   HPI: [name] is a 78 y.o. female with medical history significant of atrial fibrillation, chronic diastolic CHF, CAD, history of hysterectomy, hyperlipidemia, prediabetes, Parkinson's disease and dementia, history of CVA with right-sided weakness, history of heart murmur and other comorbidities who presents with abdominal distention, poor p.o. intake, as well as dehydration and inability to defecate.  Patient is unable to provide respiratory history due to her advanced dementia and Parkinson's disease and does answer simple questions yes and no but is unable to fully explain.  Currently stays at a skilled nursing facility and reportedly the staff there states that she has had poor p.o. intake and has been eating since Saturday and has not been drinking at all.  Patient's daughter is at bedside and provides most of the history and she states the patient ate Friday night.  There is noted abdominal distention today but daughter states that she noticed yesterday and mentioned to the nurse.  Is unclear when she has had a bowel movement or not.  Patient is unable to tell us when she is abdominal due to her underlying dementia.  Patient's family states that she is not been complaining of anything but CNA at the facility noticed that she had started vomiting on Sunday evening.  She is found to have severe abdominal distention and a CT of the abdomen pelvis was done which showed she is gaseous distention of the colon the very large stool burden identified within the rectum which favored a rectal impaction.  [facility] was called to admit this patient for abdominal distention and suspected rectal  impaction and she started on a bowel regimen and given a suppository.  Additionally patient was found to have an AKI likely due to her poor p.o. intake.  ED Course: In the ED she had basic blood work done as well as a CT of the  abdomen pelvis.  She is also given a Fleet enema without much success.  EDP also give a 500 mL bolus and placed her on normal saline at a rate of 125 mL's per hour which I decreased down to 75 mL's per hour.   Hospital Course:  Active Problems:   Essential hypertension   Coronary atherosclerosis   PAF (paroxysmal atrial fibrillation) (HCC)   Diastolic CHF (HCC)   Ambulatory dysfunction   Parkinson disease (HCC)   Constipation   Dementia due to Parkinson's disease with behavioral disturbance (HCC)   Hypokalemia   AKI (acute kidney injury) (HCC)   Leukocytosis   Acute on chronic respiratory failure with hypoxia (HCC)   Hyperbilirubinemia   Abdominal distention   Difficult intravenous access   Pressure injury of skin   Colon distention   Fecal impaction (HCC)   Acute hypoxemic respiratory failure (HCC)   Hypomagnesemia   Abdominal distention; in the setting of severe constipation and fecal impaction, colonic ileus(presenting symptom) -Patient receive smog enema without any improvement.Had bmwith Golytely. -gi consulted -had large amount rectal mucus on 1/8 am,Case discussed with GI DrBuccine who recommend kub,kub concerning if sigmoid vulvulus, ct ab/pel no acute findings -resume diet  Anemia; Acute blood loss anemia;  Dark stool. Occult blood positive. She received IV Protonix.  Anemia panel consistent with iron deficiency anemia.s/pIV iron.  Received 2 unit PRBC during admission.  No more sign of bleeding, hgb has been stable Gi do not plan to scope if hgb stable Started folic acid.continue b12 supplement, she is discharged on oral iron supplement  PAF  With afib/RVR/hypotensionon presentation, she received ivf, iv metoprolol initially Patient subsequently sinus convert on her own before initiation of amiodarone. ECHO with preserve EF, hypokinesis of mid-inferiorlateral wall myocardia. No cath planned Cardiology consulted, she is started onamiodarone  400 mg BID for 5 daysthen amiodarone 200mg  daily from 1/8 -she is continued on betablocker, dose reduced on 1/9 due to low normal bp - has been in sinus rhythm -she is taken off anticoagulation in 2015 due to gi bleed. -she is to follow up with cardiology on 1/30  Chronic diastolic chf Presented with poor oral intake, dehydration, she received ivf She started to eat more, d/c ivf euvolemic at discharge  Acute hypoxic respiratory failure likely due to aspiration pneumonia -she finished 10days of abx, hypoxia resolved -s/p swallow eval "Recommend soft finger foods to allow ease of self feeding."  Leukocytosis Wbc on presentation was 18 Wbc normalized on 1/9, continue monitor , no fever  AKI on CKDII Cr on presentation was 3.13, cr now 0.9  Hypokalemia/hypomagnesemia/hypophaspahtemia: Presented on admission, likely due to poor oral intake, replaced improved She is discharged on phos/ k and mag supplement  keep k>4,  mag>2  Calcified splenic mass, incidental finding on CT  benign hemorrhagic right,incidental finding on CT   CVA with right-sided weakness, vascular dementia, total care from SNF On quetiapine qhs which is discontinued, she did not need quetiapine in the hospital. she does not have significant agitation, she is pleasantly confused through out of the hospitalization.  Code Status:DNR  Family Communication:patient   Disposition Plan:return to snf    Consultants:  Deboraha Sprang GI DR Buccini  cardiology  Procedures:  prbc x2 unit  IJ central line placement due to poor iv access, IJ central line removed on 1/9  Antibiotics:  As above   Discharge Exam: BP 128/66   Pulse 76   Temp 98.4 F (36.9 C) (Oral)   Resp 16   Ht 5\' 2"  (1.575 m)   Wt 77.6 kg   SpO2 99%   BMI 31.29 kg/m     General:Frail, demented elderly, left eyeptosis( chronic)  Cardiovascular:RRR  Respiratory:diminished at basis  Abdomen:Soft/ND/NT,  positive BS  Musculoskeletal: No Edema  Neuro: alert,not following commands, not making meaningful conversation, pleasantly confused    Discharge Instructions You were cared for by a hospitalist during your hospital stay. If you have any questions about your discharge medications or the care you received while you were in the hospital after you are discharged, you can call the unit and asked to speak with the hospitalist on call if the hospitalist that took care of you is not available. Once you are discharged, your primary care physician will handle any further medical issues. Please note that NO REFILLS for any discharge medications will be authorized once you are discharged, as it is imperative that you return to your primary care physician (or establish a relationship with a primary care physician if you do not have one) for your aftercare needs so that they can reassess your need for medications and monitor your lab values.  Discharge Instructions    Diet general   Complete by:  As directed    Regular(soft finger foods) , patient need to be fed Liquid Administration via: Cup;Straw Medication Administration: Whole meds with liquid(as tolerated) Supervision: Full supervision/cueing for compensatory strategies Compensations: Slow rate;Small sips/bites     Increase activity slowly   Complete by:  As directed      Allergies as of 02/13/2018      Reactions   Sulfonamide Derivatives Other (See Comments)   Unknown. Pt doesn't remember.      Medication List    STOP taking these medications   amLODipine-benazepril 5-20 MG capsule Commonly known as:  LOTREL   cefTRIAXone 1 g injection Commonly known as:  ROCEPHIN   MYRBETRIQ 25 MG Tb24 tablet Generic drug:  mirabegron ER   QUEtiapine 25 MG tablet Commonly known as:  SEROQUEL     TAKE these medications   amiodarone 200 MG tablet Commonly known as:  PACERONE Take 1 tablet (200 mg total) by mouth daily.   aspirin EC 81 MG  tablet Take 1 tablet (81 mg total) by mouth daily.   atorvastatin 40 MG tablet Commonly known as:  LIPITOR Take 1 tablet (40 mg total) by mouth daily at 6 PM.   bisacodyl 10 MG suppository Commonly known as:  DULCOLAX Place 1 suppository (10 mg total) rectally every Monday, Wednesday, and Friday.   feeding supplement (ENSURE ENLIVE) Liqd Take 237 mLs by mouth 2 (two) times daily between meals.   ferrous sulfate 325 (65 FE) MG tablet Take 1 tablet (325 mg total) by mouth every Monday, Wednesday, and Friday for 24 days.   folic acid 1 MG tablet Commonly known as:  FOLVITE Take 1 tablet (1 mg total) by mouth daily.   Gerhardt's butt cream Crea Apply 1 application topically daily as needed for irritation.   magnesium oxide 400 MG tablet Commonly known as:  MAG-OX Take 1 tablet (400 mg total) by mouth daily.   metoprolol tartrate 25 MG tablet Commonly known as:  LOPRESSOR Take 0.5 tablets (12.5 mg total)  by mouth 2 (two) times daily. What changed:    medication strength  how much to take   multivitamin with minerals Tabs tablet Take 1 tablet by mouth daily.   pantoprazole 40 MG tablet Commonly known as:  PROTONIX Take 1 tablet (40 mg total) by mouth daily.   phosphorus 155-852-130 MG tablet Commonly known as:  K PHOS NEUTRAL Take 1 tablet (250 mg total) by mouth every Monday, Wednesday, and Friday.   polyethylene glycol packet Commonly known as:  MIRALAX Take 17 g by mouth daily.   potassium chloride SA 20 MEQ tablet Commonly known as:  K-DUR,KLOR-CON Take 2 tablets (40 mEq total) by mouth daily.   senna-docusate 8.6-50 MG tablet Commonly known as:  Senokot-S Take 1 tablet by mouth 2 (two) times daily.   VITAMIN B 12 PO Take 1 tablet by mouth daily.   Vitamin D (Ergocalciferol) 1.25 MG (50000 UT) Caps capsule Commonly known as:  DRISDOL Take 50,000 Units by mouth every 30 (thirty) days. 15th      Allergies  Allergen Reactions  . Sulfonamide  Derivatives Other (See Comments)    Unknown. Pt doesn't remember.   Follow-up Information    Jodelle Redhristopher, Bridgette, MD Follow up on 03/05/2018.   Specialty:  Cardiology Why:  at 9:20 AM Contact information: [address] [place] [phone]        Nadara EatonPiazza, Michael J, MD Follow up.   Specialty:  Internal Medicine Contact information: [address] [facility] [place] [address] [phone]        Hillis RangeAllred, James, MD .   Specialty:  Cardiology Contact information: [address] [place] [address] [phone]            The results of significant diagnostics from this hospitalization (including imaging, microbiology, ancillary and laboratory) are listed below for reference.    Significant Diagnostic Studies: Ct Abdomen Pelvis Wo Contrast  Result Date: 02/11/2018 CLINICAL DATA:  Sigmoid volvulus, rectal discharge EXAM: CT ABDOMEN AND PELVIS WITHOUT CONTRAST TECHNIQUE: Multidetector CT imaging of the abdomen and pelvis was performed following the standard protocol without IV contrast. COMPARISON:  Abdominal radiograph dated 02/11/2018. CT abdomen/pelvis dated 01/27/2018. FINDINGS: Motion degraded images. Lower chest: Small right pleural effusion. Trace left pleural effusion. Eventration of the left hemidiaphragm. Mild dependent atelectasis at the right lung base. Hepatobiliary: Unenhanced liver is unremarkable. Gallbladder is unremarkable. No intrahepatic or extrahepatic ductal dilatation. Pancreas: Within normal limits. Spleen: Stable 5.0 cm calcified splenic lesion (series 2/image 25), unchanged from 2015, benign. Adrenals/Urinary Tract: Adrenal glands are within normal limits. 13 mm hyperdense/hemorrhagic lesion along the posterior right lower kidney (series 2/image 45), favoring a benign hemorrhagic cyst. Left kidney is within normal limits. No renal calculi or hydronephrosis. Bladder is within normal  limits. Stomach/Bowel: Stomach is within normal limits. No evidence of bowel obstruction. Appendix is not discretely visualized. Mildly distended, gas-filled nondependent sigmoid colon (series 2/image 37), without evidence of sigmoid volvulus or colonic obstruction. Overall colonic distension is improved from prior. Rectal impaction is also improved/resolved. Vascular/Lymphatic: No evidence of abdominal aortic aneurysm. Atherosclerotic calcifications of the abdominal aorta and branch vessels. No suspicious abdominopelvic lymphadenopathy. Reproductive: Status post hysterectomy. Bilateral ovaries are within normal limits. Other: No abdominopelvic ascites. Musculoskeletal: Degenerative changes of the visualized thoracolumbar spine. Mildly heterogeneous osseous sclerosis, chronic. IMPRESSION: No evidence of sigmoid volvulus or colonic obstruction. Mildly distended, gas-filled nondependent sigmoid colon. However, overall colonic distension is improved from prior. Rectal impaction  is also improved/resolved. Additional stable ancillary findings as above. Electronically Signed   By: Charline Bills M.D.   On: 02/11/2018 17:52   Ct Abdomen Pelvis Wo Contrast  Result Date: 01/27/2018 CLINICAL DATA:  Abdominal distention. EXAM: CT ABDOMEN AND PELVIS WITHOUT CONTRAST TECHNIQUE: Multidetector CT imaging of the abdomen and pelvis was performed following the standard protocol without IV contrast. COMPARISON:  09/02/2013 FINDINGS: Lower chest: Atelectasis with subpleural consolidation noted within the right lower lobe. Hepatobiliary: No focal liver abnormality is seen. No gallstones, gallbladder wall thickening, or biliary dilatation. Pancreas: Unremarkable. No pancreatic ductal dilatation or surrounding inflammatory changes. Spleen: Calcified splenic mass is again identified measuring 4.6 by 5.2 cm, image 35/2. Unchanged. Adrenals/Urinary Tract: Normal appearance of the adrenal glands. No hydronephrosis identified  bilaterally. Intermediate attenuating nodule arising from inferior pole of the posterior cortex of right kidney is unchanged measuring 1 cm. This is incompletely characterized without IV contrast. Urinary bladder is unremarkable. Stomach/Bowel: Stomach appears collapsed. Small bowel loops are normal in course and caliber. There is marked diffuse gaseous distension of the colon up to the level of the rectum. A very large stool burden is identified within the rectum. Desiccated stool ball measures 16.7 x 9.8 by 23.0 cm. Moderate amount of stool identified within the ascending colon. Vascular/Lymphatic: Aortic atherosclerosis. No aneurysm. No abdominopelvic adenopathy. Reproductive: The uterus is not confidently identified and may be surgically absent no. No adnexal mass. Other: No free fluid or fluid collections identified. Musculoskeletal: . Mild scoliosis and multi level lumbar degenerative disc disease. IMPRESSION: 1. There is diffuse gaseous distension of the colon. A very large stool burden is identified within the rectum. Findings are favored to represent rectal impaction. 2. Unchanged calcified splenic mass compatible with a benign abnormality. 3.  Aortic Atherosclerosis (ICD10-I70.0). Electronically Signed   By: Signa Kell M.D.   On: 01/27/2018 14:36   Dg Abd 1 View  Result Date: 02/11/2018 CLINICAL DATA:  Rectal discharge EXAM: ABDOMEN - 1 VIEW COMPARISON:  02/05/2018 FINDINGS: Scattered large and small bowel gas is noted. This has increased somewhat particularly in the sigmoid colon when compared with the prior exam. No free air is seen. Splenic calcifications are noted. Fecal material is noted within the transverse and descending colons. IMPRESSION: Retained fecal material is noted within the transverse and descending colons. Prominence of the sigmoid is seen increased from the prior exam. It would be difficult to exclude a sigmoid volvulus on the basis of this exam. CT imaging may be helpful for  further evaluation. Electronically Signed   By: Alcide Clever M.D.   On: 02/11/2018 13:15   Dg Abd 1 View  Result Date: 02/04/2018 CLINICAL DATA:  Fecal impaction.  Abdominal distension. EXAM: ABDOMEN - 1 VIEW COMPARISON:  Abdominal radiograph 02/03/2018 FINDINGS: Persistent gaseous distended loop of bowel within the central abdomen. Gas within nondilated loops of small bowel. Peripheral calcified splenic mass left upper quadrant. No definite pneumatosis or free intraperitoneal air. Lumbar spine degenerative changes. IMPRESSION: Persistent marked gaseous distention of the colon. Electronically Signed   By: Annia Belt M.D.   On: 02/04/2018 10:57   Dg Abd 1 View  Result Date: 02/02/2018 CLINICAL DATA:  Abdominal distention. EXAM: ABDOMEN - 1 VIEW COMPARISON:  02/01/2018, 01/30/2018.  CT 01/27/2018. FINDINGS: Persistent bowel distention particularly of the sigmoid is again noted. Similar findings noted on prior exam. No free air. Calcific density left upper quadrant unchanged. Degenerative changes scoliosis thoracic and lumbar spine again noted. IMPRESSION: Persistent bowel distention particularly  of the sigmoid colon. No significant change from prior exam. Electronically Signed   By: Maisie Fus  Register   On: 02/02/2018 09:57   Dg Abd 1 View  Result Date: 02/01/2018 CLINICAL DATA:  Abdominal distension. EXAM: ABDOMEN - 1 VIEW COMPARISON:  Abdominal radiograph 01/30/2018 FINDINGS: Read demonstrated marked gaseous distention of multiple loops of large and small bowel throughout the abdomen. Supine evaluation limited for the detection of free intraperitoneal air. Lumbar spine degenerative changes. Unchanged calcified mass left upper quadrant. IMPRESSION: Read demonstrated gaseous distended loops of large and small bowel throughout the abdomen. Electronically Signed   By: Annia Belt M.D.   On: 02/01/2018 12:06   Dg Abd 1 View  Result Date: 01/30/2018 CLINICAL DATA:  ABDOMINAL DISTENTION EXAM: ABDOMEN - 1  VIEW COMPARISON:  KUB OF 01/29/2018 AND 01/28/2017 FINDINGS: There is little change in the gaseous distention of both large and small bowel. As noted previously this may be related to fecal impaction of the rectum or possibly a distal colonic lesion. 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 There are degenerative changes diffusely throughout the thoracolumbar spine. IMPRESSION: Little change in gaseous distention of large and small bowel. Electronically Signed   By: Dwyane Dee M.D.   On: 01/30/2018 11:59   Dg Abd 1 View  Result Date: 01/29/2018 CLINICAL DATA:  Follow-up fecal impaction. EXAM: ABDOMEN - 1 VIEW COMPARISON:  Abdominal radiograph performed 01/28/2018 FINDINGS: The sigmoid colon and rectum remain diffusely distended with stool, compatible with fecal impaction, though this is difficult to fully characterize on radiograph. There is persistent diffuse distention of small and large bowel loops, reflecting associated ileus. Mild degenerative change is noted along the lumbar spine. Right basilar airspace opacity raises concern for pneumonia. IMPRESSION: 1. The sigmoid colon and rectum remain diffusely distended with stool, compatible with fecal impaction. Underlying diffuse distention of small and large bowel loops. 2. Right basilar airspace opacity raises concern for pneumonia. Electronically Signed   By: Roanna Raider M.D.   On: 01/29/2018 06:22   Dg Abd 1 View  Result Date: 01/28/2018 CLINICAL DATA:  Abdominal distension EXAM: ABDOMEN - 1 VIEW COMPARISON:  01/27/2018 CT FINDINGS: Two supine views. Compared to yesterday's scout view, similar diffuse colonic distension. Stool ball within the rectum, again consistent with fecal impaction. No pneumatosis or evidence of free intraperitoneal air. Calcified splenic mass as detailed on CT, 5.3 cm. IMPRESSION: Similar diffuse colonic distension, likely related to fecal impaction. Electronically Signed   By: Jeronimo Greaves M.D.   On: 01/28/2018 07:17   US Renal  Result Date: 01/27/2018 CLINICAL DATA:  Acute kidney injury EXAM: RENAL / URINARY TRACT ULTRASOUND COMPLETE COMPARISON:  CT abdomen 01/27/2018 FINDINGS: Right Kidney: Renal measurements: 10.0 by 4.0 by 3.8 cm = volume: 79 mL. A 2.2 by 2.3 by 1.7 cm mildly complex right kidney lower pole lesion likely reflects the parapelvic cystic lesion shown on recent CT. The smaller exophytic 1.0 cm complex lesions shown on recent CT is not well seen sonographically. Accentuated renal echogenicity. Left Kidney: Renal measurements: 7.0 by 3.8 by 3.7 cm = volume: 51.5 mL. Accentuated renal echogenicity. Bladder: Nondistended urinary bladder, 8.1 by 2.1 by 10.1 cm (volume = 90 cm^3). IMPRESSION: 1. Echogenic kidneys potentially from chronic medical renal disease. Kidneys are small and atrophic. 2. Mildly complex suspected parapelvic cyst in the right kidney lower pole. A smaller 1.0 cm exophytic lesion from the right kidney lower pole shown on recent CT is  not well seen by ultrasound. Electronically Signed   By: Gaylyn Rong M.D.   On: 01/27/2018 20:26   Dg Chest Port 1 View  Result Date: 02/03/2018 CLINICAL DATA:  Central line placement EXAM: PORTABLE CHEST 1 VIEW COMPARISON:  01/30/2018 FINDINGS: Right central line is been placed with the tip in the upper SVC. No pneumothorax. Low lung volumes. Patchy right lung airspace opacities, improved since prior study. No focal opacity on the left. No effusions or acute bony abnormality. IMPRESSION: Right central line placement with the tip in the upper right atrium. No pneumothorax. Low lung volumes with patchy right lung airspace disease. Improved aeration on the left. Electronically Signed   By: Charlett Nose M.D.   On: 02/03/2018 12:30   Dg Chest Port 1 View  Result Date: 01/30/2018 CLINICAL DATA:  History of atrial fibrillation, CHF, dementia EXAM: PORTABLE CHEST 1 VIEW COMPARISON:  Portable chest x-ray of 01/27/2018  FINDINGS: Aeration of the lungs has improved somewhat. There are still opacities at both lung bases which may represent atelectasis or pneumonia. No definite effusion is seen. The heart is mildly enlarged and stable. IMPRESSION: 1. Improved aeration. Persistent basilar opacities may reflect atelectasis or pneumonia. 2. Stable mild cardiomegaly. Electronically Signed   By: Dwyane Dee M.D.   On: 01/30/2018 12:02   Dg Chest Port 1 View  Result Date: 01/27/2018 CLINICAL DATA:  Hypoxia EXAM: PORTABLE CHEST 1 VIEW COMPARISON:  09/03/2013 FINDINGS: Extremely low lung volumes. Lung volumes are decreased from comparison exam. Bibasilar atelectasis. Cardiac silhouette is difficult to evaluate. Patient rotated additionally. IMPRESSION: Extremely low lung volumes. Bibasilar atelectasis. No clear acute findings. Electronically Signed   By: Genevive Bi M.D.   On: 01/27/2018 19:49   Dg Abd Portable 1v  Result Date: 02/05/2018 CLINICAL DATA:  78 year old female with abdominal distension. EXAM: PORTABLE ABDOMEN - 1 VIEW COMPARISON:  02/04/2018 and earlier, CT Abdomen and Pelvis 01/27/2018. FINDINGS: Portable AP supine view at 0420 hours. Benign calcified 5 centimeter splenic lesion redemonstrated in the left upper quadrant. Substantially decreased retained stool in the rectosigmoid colon since the CT and possible rectal catheter in place. Improved bowel gas pattern since yesterday, regressed gas distended sigmoid colon. Gas-filled bowel loops elsewhere remain somewhat featureless in appearance. Stable visualized osseous structures. Stable pelvic phleboliths. IMPRESSION: 1. Improved bowel gas pattern since yesterday with decompressed sigmoid colon. Substantially regressed stool in the rectosigmoid colon since December. 2. Remaining bowel loops appear somewhat featureless, but there is no evidence of mechanical bowel obstruction at this time. Electronically Signed   By: Odessa Fleming M.D.   On: 02/05/2018 07:28   Dg Abd  Portable 1v  Result Date: 02/03/2018 CLINICAL DATA:  Tachycardia EXAM: PORTABLE ABDOMEN - 1 VIEW COMPARISON:  02/02/2018 01/28/2018; CT abdomen pelvis-01/27/2018 FINDINGS: Similar-appearing marked distension of the colon without significant interval change. No supine evidence of pneumoperitoneum. No pneumatosis or portal venous gas Peripherally calcified splenic mass overlies the left upper abdominal quadrant. Moderate severe multilevel lumbar spine. No acute osseous abnormalities. Multiple phleboliths overlie the lower pelvis bilaterally. IMPRESSION: Marked gaseous distention of the colon without interval change. No definite pneumoperitoneum, pneumatosis or portal venous gas. Electronically Signed   By: Simonne Come M.D.   On: 02/03/2018 09:15    Microbiology: No results found for this or any previous visit (from the past 240 hour(s)).   Labs: Basic Metabolic Panel: Recent Labs  Lab 02/07/18 0259 02/08/18 1610 02/09/18 9604 02/10/18 0323 02/11/18 0442 02/12/18 0254 02/13/18 0701  NA 148*  146* 144 143 141 144 144  K 3.1* 3.2* 3.6 3.8 3.6 3.0* 3.7  CL 120* 117* 115* 113* 110 111 113*  CO2 21* 24 23 25 25 27 23   GLUCOSE 89 92 98 110* 108* 89 87  BUN 7* 6* 8 8 16 14 13   CREATININE 0.78 0.78 0.82 0.97 1.03* 0.92 0.93  CALCIUM 8.0* 8.3* 8.5* 8.3* 8.6* 8.3* 8.2*  MG  --   --   --   --   --  1.7 2.0  PHOS 3.4 2.7  --   --  3.0  --   --    Liver Function Tests: No results for input(s): AST, ALT, ALKPHOS, BILITOT, PROT, ALBUMIN in the last 168 hours. No results for input(s): LIPASE, AMYLASE in the last 168 hours. No results for input(s): AMMONIA in the last 168 hours. CBC: Recent Labs  Lab 02/08/18 0508 02/09/18 0212 02/10/18 0323 02/11/18 0442 02/12/18 0254  WBC 11.3* 13.8* 11.6* 12.7* 10.4  NEUTROABS  --   --   --   --  8.0*  HGB 7.4* 8.1* 7.7* 8.4* 7.8*  HCT 23.9* 26.6* 25.3* 28.0* 26.0*  MCV 85.1 87.2 87.8 89.7 88.4  PLT 172 202 202 227 223   Cardiac Enzymes: No results  for input(s): CKTOTAL, CKMB, CKMBINDEX, TROPONINI in the last 168 hours. BNP: BNP (last 3 results) No results for input(s): BNP in the last 8760 hours.  ProBNP (last 3 results) No results for input(s): PROBNP in the last 8760 hours.  CBG: Recent Labs  Lab 02/09/18 0734 02/10/18 0731 02/11/18 0839 02/12/18 0733 02/13/18 0725  GLUCAP 96 86 92 91 86       Signed:  Albertine GratesFang Tanyla Stege MD, PhD  [facility] 02/13/2018, 10:05 AM

## 2018-02-13 NOTE — Progress Notes (Addendum)
RN called Eligha Bridegroom for report to RN at 1045. No question at this time. RN called patient's daughter to update as well.

## 2018-03-05 ENCOUNTER — Ambulatory Visit: Payer: Medicare Other | Admitting: Cardiology

## 2018-03-27 ENCOUNTER — Ambulatory Visit: Payer: Medicare Other | Admitting: Podiatry

## 2018-05-08 ENCOUNTER — Ambulatory Visit: Payer: Medicare Other | Admitting: Podiatry

## 2020-05-19 IMAGING — DX DG CHEST 1V PORT
1 series · 1 of 1 positions shown · non-contrast
Comparison: 09/03/2013

CLINICAL DATA: Hypoxia

EXAM:
PORTABLE CHEST 1 VIEW

[chest ap]
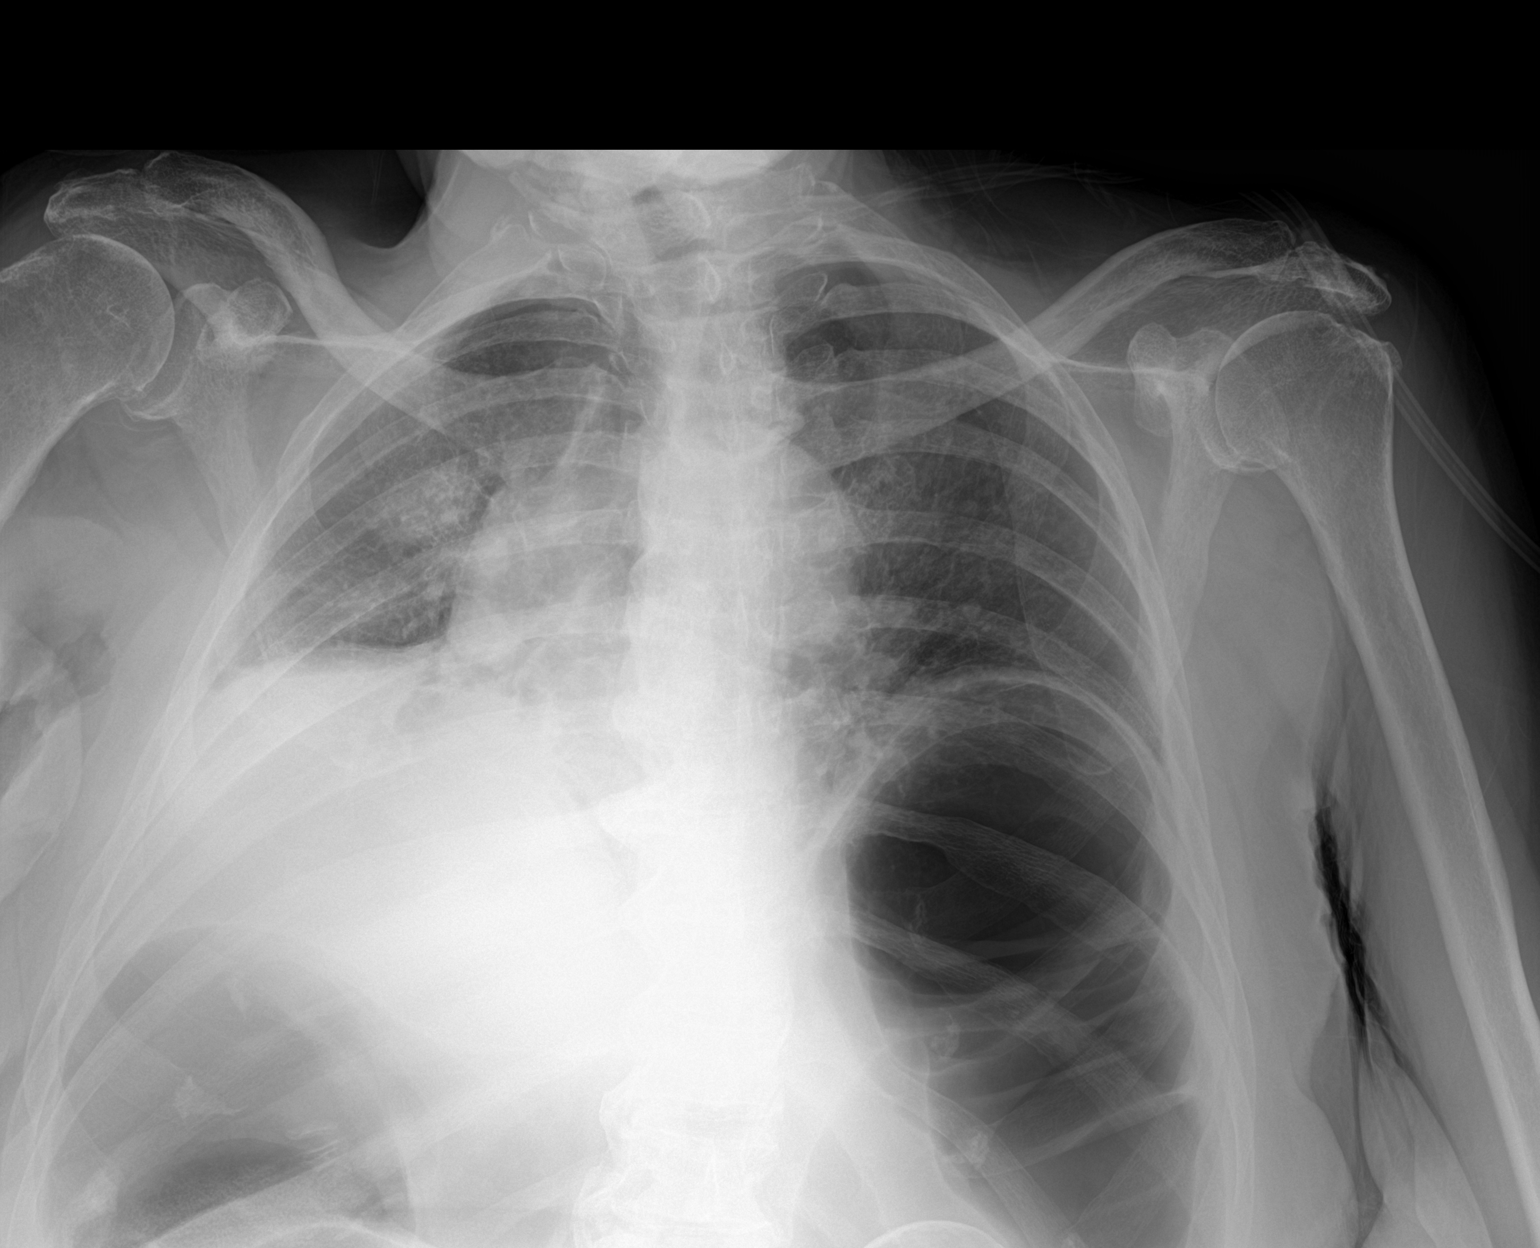

[1 of 1 positions shown; findings below may reference images not displayed]

FINDINGS: Extremely low lung volumes. Lung volumes are decreased from
comparison exam. Bibasilar atelectasis. Cardiac silhouette is
difficult to evaluate. Patient rotated additionally.
IMPRESSION: Extremely low lung volumes. Bibasilar atelectasis. No clear acute
findings.

## 2020-05-22 IMAGING — DX DG CHEST 1V PORT
1 series · 1 of 1 positions shown · non-contrast
Comparison: Portable chest x-ray of 01/27/2018

CLINICAL DATA: History of atrial fibrillation, CHF, dementia

EXAM:
PORTABLE CHEST 1 VIEW

[chest ap]
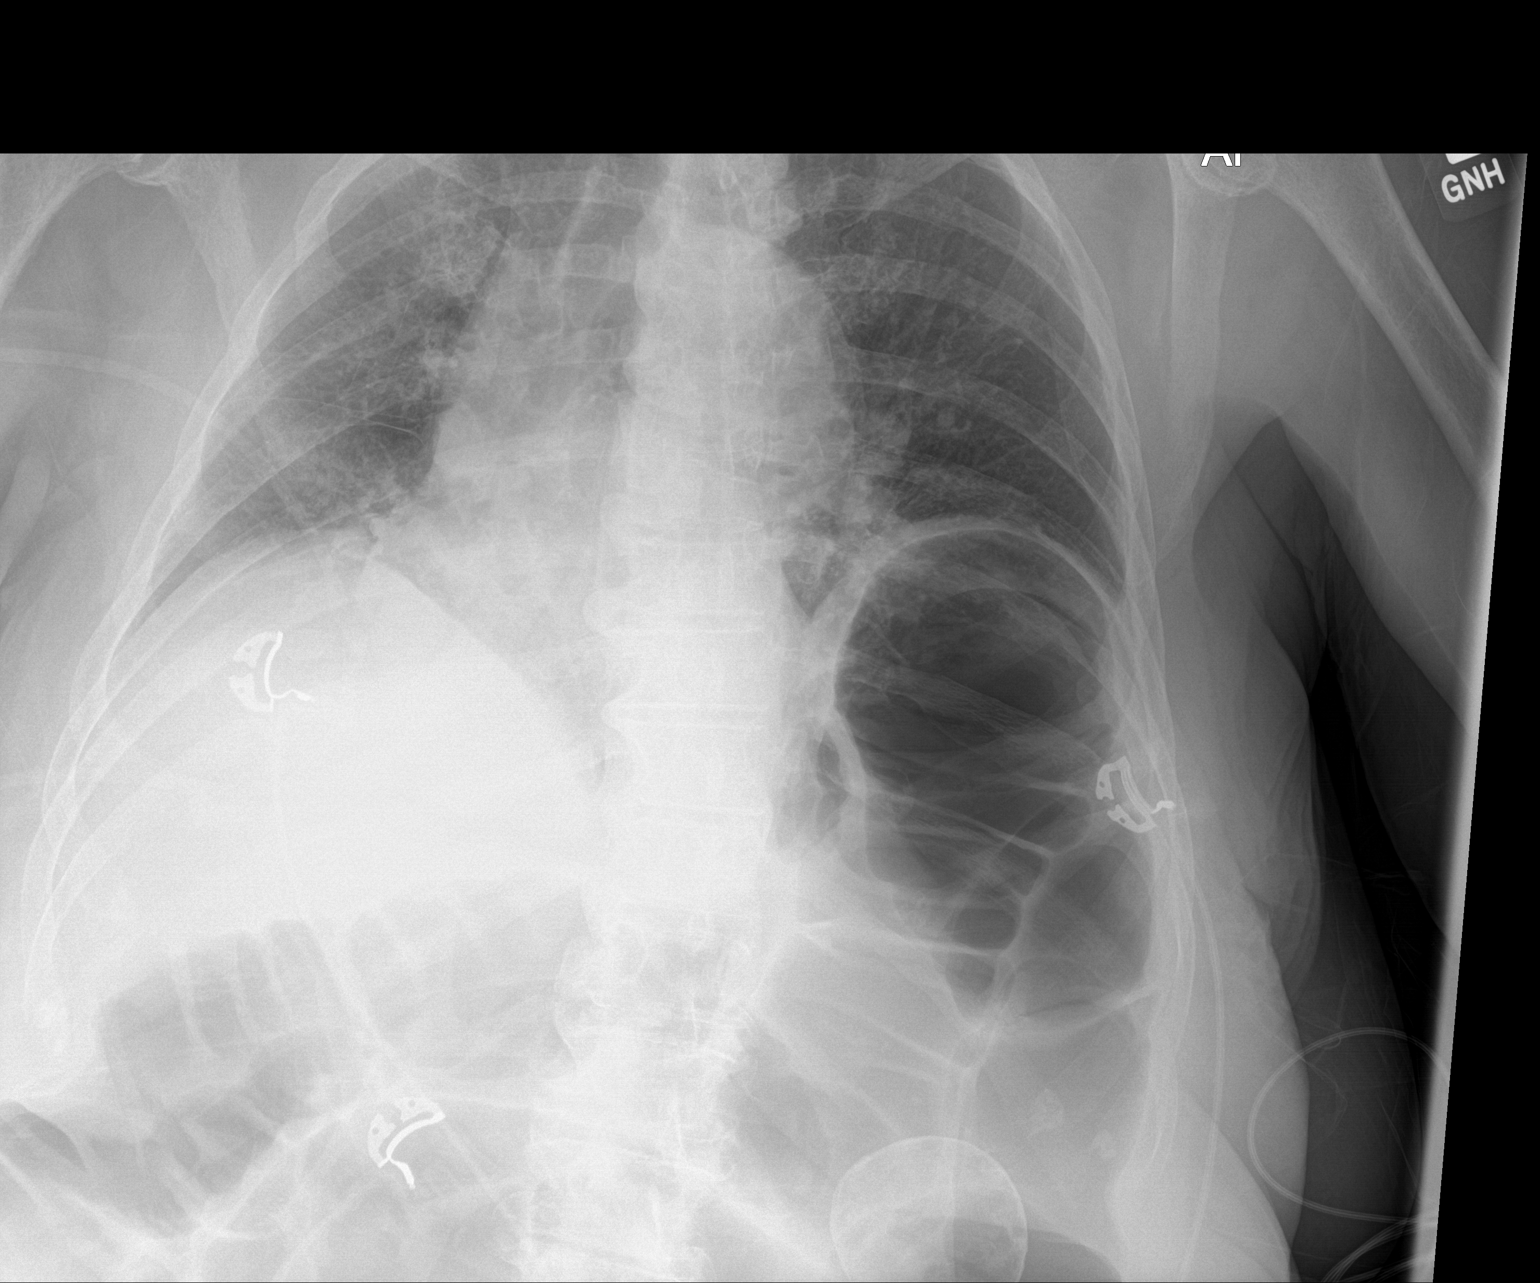

[1 of 1 positions shown; findings below may reference images not displayed]

FINDINGS: Aeration of the lungs has improved somewhat. There are still
opacities at both lung bases which may represent atelectasis or
pneumonia. No definite effusion is seen. The heart is mildly
enlarged and stable.
IMPRESSION: 1. Improved aeration. Persistent basilar opacities may reflect
atelectasis or pneumonia.
2. Stable mild cardiomegaly.

## 2020-06-15 ENCOUNTER — Emergency Department (HOSPITAL_COMMUNITY): Payer: Medicare Other

## 2020-06-15 ENCOUNTER — Other Ambulatory Visit: Payer: Self-pay

## 2020-06-15 ENCOUNTER — Encounter (HOSPITAL_COMMUNITY): Payer: Self-pay | Admitting: Surgery

## 2020-06-15 ENCOUNTER — Inpatient Hospital Stay (HOSPITAL_COMMUNITY)
Admission: EM | Admit: 2020-06-15 | Discharge: 2020-06-23 | DRG: 871 | Disposition: A | Discharge status: Skilled Nursing Facility | Payer: Medicare Other | Source: Skilled Nursing Facility | Attending: Family Medicine | Admitting: Family Medicine

## 2020-06-15 DIAGNOSIS — I480 Paroxysmal atrial fibrillation: Secondary | ICD-10-CM

## 2020-06-15 DIAGNOSIS — Z6829 Body mass index (BMI) 29.0-29.9, adult: Secondary | ICD-10-CM

## 2020-06-15 DIAGNOSIS — K56609 Unspecified intestinal obstruction, unspecified as to partial versus complete obstruction: Secondary | ICD-10-CM | POA: Diagnosis present

## 2020-06-15 DIAGNOSIS — K5641 Fecal impaction: Secondary | ICD-10-CM | POA: Diagnosis present

## 2020-06-15 DIAGNOSIS — E785 Hyperlipidemia, unspecified: Secondary | ICD-10-CM | POA: Diagnosis present

## 2020-06-15 DIAGNOSIS — E860 Dehydration: Secondary | ICD-10-CM | POA: Diagnosis present

## 2020-06-15 DIAGNOSIS — R400 Somnolence: Secondary | ICD-10-CM | POA: Diagnosis not present

## 2020-06-15 DIAGNOSIS — Z96651 Presence of right artificial knee joint: Secondary | ICD-10-CM | POA: Diagnosis present

## 2020-06-15 DIAGNOSIS — H5462 Unqualified visual loss, left eye, normal vision right eye: Secondary | ICD-10-CM | POA: Diagnosis present

## 2020-06-15 DIAGNOSIS — G9341 Metabolic encephalopathy: Secondary | ICD-10-CM | POA: Diagnosis present

## 2020-06-15 DIAGNOSIS — R6520 Severe sepsis without septic shock: Secondary | ICD-10-CM | POA: Diagnosis present

## 2020-06-15 DIAGNOSIS — Z7982 Long term (current) use of aspirin: Secondary | ICD-10-CM

## 2020-06-15 DIAGNOSIS — R29810 Facial weakness: Secondary | ICD-10-CM | POA: Diagnosis present

## 2020-06-15 DIAGNOSIS — K567 Ileus, unspecified: Secondary | ICD-10-CM | POA: Diagnosis present

## 2020-06-15 DIAGNOSIS — Z20822 Contact with and (suspected) exposure to covid-19: Secondary | ICD-10-CM | POA: Diagnosis present

## 2020-06-15 DIAGNOSIS — I69351 Hemiplegia and hemiparesis following cerebral infarction affecting right dominant side: Secondary | ICD-10-CM

## 2020-06-15 DIAGNOSIS — G20 Parkinson's disease: Secondary | ICD-10-CM | POA: Diagnosis present

## 2020-06-15 DIAGNOSIS — F0280 Dementia in other diseases classified elsewhere without behavioral disturbance: Secondary | ICD-10-CM | POA: Diagnosis present

## 2020-06-15 DIAGNOSIS — E440 Moderate protein-calorie malnutrition: Secondary | ICD-10-CM | POA: Diagnosis present

## 2020-06-15 DIAGNOSIS — J690 Pneumonitis due to inhalation of food and vomit: Secondary | ICD-10-CM | POA: Diagnosis present

## 2020-06-15 DIAGNOSIS — R159 Full incontinence of feces: Secondary | ICD-10-CM | POA: Diagnosis not present

## 2020-06-15 DIAGNOSIS — I2510 Atherosclerotic heart disease of native coronary artery without angina pectoris: Secondary | ICD-10-CM | POA: Diagnosis present

## 2020-06-15 DIAGNOSIS — E876 Hypokalemia: Secondary | ICD-10-CM | POA: Diagnosis not present

## 2020-06-15 DIAGNOSIS — E7800 Pure hypercholesterolemia, unspecified: Secondary | ICD-10-CM | POA: Diagnosis present

## 2020-06-15 DIAGNOSIS — E870 Hyperosmolality and hypernatremia: Secondary | ICD-10-CM | POA: Diagnosis not present

## 2020-06-15 DIAGNOSIS — N179 Acute kidney failure, unspecified: Secondary | ICD-10-CM | POA: Diagnosis present

## 2020-06-15 DIAGNOSIS — Z0189 Encounter for other specified special examinations: Secondary | ICD-10-CM

## 2020-06-15 DIAGNOSIS — F419 Anxiety disorder, unspecified: Secondary | ICD-10-CM | POA: Diagnosis present

## 2020-06-15 DIAGNOSIS — R64 Cachexia: Secondary | ICD-10-CM | POA: Diagnosis present

## 2020-06-15 DIAGNOSIS — Z79899 Other long term (current) drug therapy: Secondary | ICD-10-CM

## 2020-06-15 DIAGNOSIS — F0390 Unspecified dementia without behavioral disturbance: Secondary | ICD-10-CM | POA: Diagnosis present

## 2020-06-15 DIAGNOSIS — R52 Pain, unspecified: Secondary | ICD-10-CM

## 2020-06-15 DIAGNOSIS — I82401 Acute embolism and thrombosis of unspecified deep veins of right lower extremity: Secondary | ICD-10-CM | POA: Diagnosis present

## 2020-06-15 DIAGNOSIS — I5033 Acute on chronic diastolic (congestive) heart failure: Secondary | ICD-10-CM | POA: Diagnosis present

## 2020-06-15 DIAGNOSIS — R4781 Slurred speech: Secondary | ICD-10-CM | POA: Diagnosis present

## 2020-06-15 DIAGNOSIS — I4819 Other persistent atrial fibrillation: Secondary | ICD-10-CM | POA: Diagnosis present

## 2020-06-15 DIAGNOSIS — I110 Hypertensive heart disease with heart failure: Secondary | ICD-10-CM | POA: Diagnosis present

## 2020-06-15 DIAGNOSIS — Z515 Encounter for palliative care: Secondary | ICD-10-CM

## 2020-06-15 DIAGNOSIS — A419 Sepsis, unspecified organism: Secondary | ICD-10-CM | POA: Diagnosis not present

## 2020-06-15 DIAGNOSIS — Z90710 Acquired absence of both cervix and uterus: Secondary | ICD-10-CM

## 2020-06-15 DIAGNOSIS — Z66 Do not resuscitate: Secondary | ICD-10-CM | POA: Diagnosis present

## 2020-06-15 DIAGNOSIS — I452 Bifascicular block: Secondary | ICD-10-CM | POA: Diagnosis present

## 2020-06-15 DIAGNOSIS — J9601 Acute respiratory failure with hypoxia: Secondary | ICD-10-CM | POA: Diagnosis present

## 2020-06-15 DIAGNOSIS — Z882 Allergy status to sulfonamides status: Secondary | ICD-10-CM

## 2020-06-15 DIAGNOSIS — I82441 Acute embolism and thrombosis of right tibial vein: Secondary | ICD-10-CM | POA: Diagnosis present

## 2020-06-15 DIAGNOSIS — Z8249 Family history of ischemic heart disease and other diseases of the circulatory system: Secondary | ICD-10-CM

## 2020-06-15 LAB — URINALYSIS, ROUTINE W REFLEX MICROSCOPIC
Bilirubin Urine: NEGATIVE
Glucose, UA: NEGATIVE mg/dL
Hgb urine dipstick: NEGATIVE
Ketones, ur: 5 mg/dL — AB
Leukocytes,Ua: NEGATIVE
Nitrite: NEGATIVE
Protein, ur: 30 mg/dL — AB
Specific Gravity, Urine: 1.027 (ref 1.005–1.030)
pH: 5 (ref 5.0–8.0)

## 2020-06-15 LAB — CBC WITH DIFFERENTIAL/PLATELET
Abs Immature Granulocytes: 0 10*3/uL (ref 0.00–0.07)
Basophils Absolute: 0 10*3/uL (ref 0.0–0.1)
Basophils Relative: 0 %
Eosinophils Absolute: 0 10*3/uL (ref 0.0–0.5)
Eosinophils Relative: 0 %
HCT: 52 % — ABNORMAL HIGH (ref 36.0–46.0)
Hemoglobin: 16.2 g/dL — ABNORMAL HIGH (ref 12.0–15.0)
Lymphocytes Relative: 14 %
Lymphs Abs: 1.2 10*3/uL (ref 0.7–4.0)
MCH: 26.4 pg (ref 26.0–34.0)
MCHC: 31.2 g/dL (ref 30.0–36.0)
MCV: 84.8 fL (ref 80.0–100.0)
Monocytes Absolute: 1.1 10*3/uL — ABNORMAL HIGH (ref 0.1–1.0)
Monocytes Relative: 13 %
Neutro Abs: 6.2 10*3/uL (ref 1.7–7.7)
Neutrophils Relative %: 73 %
Platelets: 253 10*3/uL (ref 150–400)
RBC: 6.13 MIL/uL — ABNORMAL HIGH (ref 3.87–5.11)
RDW: 16.3 % — ABNORMAL HIGH (ref 11.5–15.5)
WBC: 8.5 10*3/uL (ref 4.0–10.5)
nRBC: 0 % (ref 0.0–0.2)
nRBC: 0 /100 WBC

## 2020-06-15 LAB — COMPREHENSIVE METABOLIC PANEL
ALT: 15 U/L (ref 0–44)
AST: 19 U/L (ref 15–41)
Albumin: 3.4 g/dL — ABNORMAL LOW (ref 3.5–5.0)
Alkaline Phosphatase: 81 U/L (ref 38–126)
Anion gap: 12 (ref 5–15)
BUN: 55 mg/dL — ABNORMAL HIGH (ref 8–23)
CO2: 26 mmol/L (ref 22–32)
Calcium: 8.9 mg/dL (ref 8.9–10.3)
Chloride: 105 mmol/L (ref 98–111)
Creatinine, Ser: 2.15 mg/dL — ABNORMAL HIGH (ref 0.44–1.00)
GFR, Estimated: 22 mL/min — ABNORMAL LOW (ref >60)
Glucose, Bld: 132 mg/dL — ABNORMAL HIGH (ref 70–99)
Potassium: 4.2 mmol/L (ref 3.5–5.1)
Sodium: 143 mmol/L (ref 135–145)
Total Bilirubin: 1.8 mg/dL — ABNORMAL HIGH (ref 0.3–1.2)
Total Protein: 6.4 g/dL — ABNORMAL LOW (ref 6.5–8.1)

## 2020-06-15 LAB — RAPID URINE DRUG SCREEN, HOSP PERFORMED
Amphetamines: NOT DETECTED
Barbiturates: NOT DETECTED
Benzodiazepines: NOT DETECTED
Cocaine: NOT DETECTED
Opiates: NOT DETECTED
Tetrahydrocannabinol: NOT DETECTED

## 2020-06-15 LAB — D-DIMER, QUANTITATIVE: D-Dimer, Quant: 12.14 ug/mL-FEU — ABNORMAL HIGH (ref 0.00–0.50)

## 2020-06-15 LAB — AMMONIA: Ammonia: 35 umol/L (ref 9–35)

## 2020-06-15 LAB — CBG MONITORING, ED: Glucose-Capillary: 150 mg/dL — ABNORMAL HIGH (ref 70–99)

## 2020-06-15 LAB — LACTIC ACID, PLASMA: Lactic Acid, Venous: 2.4 mmol/L (ref 0.5–1.9)

## 2020-06-15 MED ORDER — DEXTROSE-NACL 5-0.45 % IV SOLN: INTRAVENOUS | Status: DC

## 2020-06-15 MED ORDER — VANCOMYCIN HCL 1000 MG/200ML IV SOLN
1000.0000 mg | Freq: Once | INTRAVENOUS | Status: AC
  Administered 2020-06-15: 1000 mg via INTRAVENOUS
  Filled 2020-06-15: qty 200

## 2020-06-15 MED ORDER — SODIUM CHLORIDE 0.9 % IV SOLN
3.0000 g | Freq: Once | INTRAVENOUS | Status: AC
  Administered 2020-06-15: 3 g via INTRAVENOUS
  Filled 2020-06-15: qty 8

## 2020-06-15 MED ORDER — LACTATED RINGERS IV BOLUS
1000.0000 mL | Freq: Once | INTRAVENOUS | Status: AC
  Administered 2020-06-15: 1000 mL via INTRAVENOUS

## 2020-06-15 NOTE — ED Triage Notes (Signed)
Pt BIB [facility] from [facility] facility for concerns of altered mental status. Per nursing staff report to EMS, that during dinner last night patient vomited her food. Upon waking up this morning patient unable to swallow and exhibited a R sided facial droop.  Facility suspected possible aspiration from vomiting last night. Pt declining slowly as far as responsiveness goes per EMS. Upon contact patient was talking to personnel, however now needs more stimulation to respond. At facility she was given 1 L of fluid, rocephin and a CXR- still pending. Patient can be combative at baseline and confused. Pt did have another episode of emesis w/ ems but the patient actually swallowed it. VSS per EMS. Pt mildly hypoxic requiring 2 L of O2 per [place]. NAD at this time.

## 2020-06-15 NOTE — ED Notes (Addendum)
Pt vomited a small amount mouth suctioned. Rhonchi heard in all lung fields Dr Rhunette Croft aware

## 2020-06-15 NOTE — ED Notes (Signed)
Patient transported to X-ray 

## 2020-06-15 NOTE — Consult Note (Signed)
[name] KinsBarbara J [name] 08/08/1940  [id number].    Requesting MD: Dr. Rhunette CroftNanavati Chief Complaint/Reason for Consult: small bowel obstruction vs ileus  HPI:  Ms. [name] MeekerMiller is an 80 yo female with a history of a-fib, CHF, HTN, HLD, and OA who presented to the ED today with altered mental status and facial droop. The patient lives at a nursing facility and per report started having vomiting last night. Today she was noted to have right sided facial droop and was minimally responsive. She was brought to the ED. Labs are significant for an AKI. A head CT did not show any new acute abnormalities. A CT chest/abd/pelvis showed gastric distension and small bowel distension, concerning for an obstruction, as well as concern for developing aspiration pneumonia. The patient is undergoing a stroke workup. General surgery was consulted for SBO.  The patient's daughter is present at bedside and reports the patient has previously been treated for a bowel obstruction several years ago at [facility]. On chart review it appears she was admitted in 2020 with vomiting and fecal impaction. She has had a hysterectomy many years ago but no other abdominal surgeries.  ROS: Review of Systems  Unable to perform ROS: Mental status change    Family History  Problem Relation Age of Onset  . Cancer Mother 1388       died  . Heart attack Father 1162       died    Past Medical History:  Diagnosis Date  . Anxiety disorder   . Atrial fibrillation (HCC)    a. new 04/2011;  b. Pradaxa  . Chronic diastolic heart failure (HCC)    Echocardiogram 04/14/11: Moderate LVH, EF 60-65%, mild LAE.  Marland Kitchen. 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 Gastritis   . H/O: hysterectomy   . Heart murmur    from age 80 associated with some hemoptysis  . Hypercholesteremia   . Hypertension   . Impaired glucose tolerance   . Obesity   . Osteoarthritis   .  Parkinson's disease (HCC)    patient denies having parkinsons  . Stroke [facility](HCC) 2007   Residual R sided weakness, uses a wheelchair, walker at home    Past Surgical History:  Procedure Laterality Date  . CARPAL TUNNEL RELEASE     right  . 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 LEFT HEART CATHETERIZATION WITH CORONARY ANGIOGRAM N/A 05/03/2011   Procedure: LEFT HEART CATHETERIZATION WITH CORONARY ANGIOGRAM;  Surgeon: Herby Abrahamhomas D Stuckey, MD;  Location: [facility] CATH LAB;  Service: Cardiovascular;  Laterality: N/A;  . REPLACEMENT TOTAL KNEE     right  . TONSILLECTOMY      Social History:  reports that she has never smoked. She has never used smokeless tobacco. She reports that she does not drink alcohol and does not use drugs.  Allergies:  Allergies  Allergen Reactions  . Sulfonamide Derivatives Other (See Comments)    Unknown. Pt doesn't remember.    (Not in a hospital admission)    Physical Exam: Blood pressure 91/71, pulse (!) 117, temperature 98 F (36.7 C), temperature source Axillary, resp. rate (!) 25, height 5\' 2"  (1.575 m), weight 77.6 kg, SpO2 (!) 86 %. General: resting comfortably, appears ill and very frail Neurological: nonverbal, right sided facial droop HEENT: normocephalic, atraumatic CV: tachycardic to 120s Respiratory: normal work of breathing on  nasal cannula, SpO2 88% Abdomen: distended but soft, minimally tender Extremities: warm and well-perfused, no deformities Skin: warm and dry, no jaundice, no rashes or lesions   Results for orders placed or performed during the hospital encounter of 06/15/20 (from the past 48 hour(s))  CBC with Differential     Status: Abnormal   Collection Time: 06/15/20  2:10 PM  Result Value Ref Range   WBC 8.5 4.0 - 10.5 K/uL   RBC 6.13 (H) 3.87 - 5.11 MIL/uL   Hemoglobin 16.2 (H) 12.0 - 15.0 g/dL   HCT 62.2 (H) 63.3 - 35.4 %   MCV 84.8 80.0 - 100.0 fL   MCH 26.4 26.0 - 34.0 pg   MCHC 31.2 30.0 - 36.0 g/dL   RDW  56.2 (H) 56.3 - 15.5 %   Platelets 253 150 - 400 K/uL   nRBC 0.0 0.0 - 0.2 %   Neutrophils Relative % 73 %   Neutro Abs 6.2 1.7 - 7.7 K/uL   Lymphocytes Relative 14 %   Lymphs Abs 1.2 0.7 - 4.0 K/uL   Monocytes Relative 13 %   Monocytes Absolute 1.1 (H) 0.1 - 1.0 K/uL   Eosinophils Relative 0 %   Eosinophils Absolute 0.0 0.0 - 0.5 K/uL   Basophils Relative 0 %   Basophils Absolute 0.0 0.0 - 0.1 K/uL   WBC Morphology See Note     Comment: Toxic Granulation   nRBC 0 0 /100 WBC   Abs Immature Granulocytes 0.00 0.00 - 0.07 K/uL   Tear Drop Cells PRESENT     Comment: Performed at [facility], [address] [place], [place] [address]  Ammonia     Status: None   Collection Time: 06/15/20  4:56 PM  Result Value Ref Range   Ammonia 35 9 - 35 umol/L    Comment: Performed at [facility], [address], [place], [place] [address]  Lactic acid, plasma     Status: Abnormal   Collection Time: 06/15/20  4:56 PM  Result Value Ref Range   Lactic Acid, Venous 2.4 (HH) 0.5 - 1.9 mmol/L    Comment: CRITICAL RESULT CALLED TO, READ BACK BY AND VERIFIED WITH: Verl Blalock 1857 06/15/2020 WBOND Performed at [facility], [address] [place], [place] [address]   Comprehensive metabolic panel     Status: Abnormal   Collection Time: 06/15/20  4:56 PM  Result Value Ref Range   Sodium 143 135 - 145 mmol/L   Potassium 4.2 3.5 - 5.1 mmol/L   Chloride 105 98 - 111 mmol/L   CO2 26 22 - 32 mmol/L   Glucose, Bld 132 (H) 70 - 99 mg/dL    Comment: Glucose reference range applies only to samples taken after fasting for at least 8 hours.   BUN 55 (H) 8 - 23 mg/dL   Creatinine, Ser 2.62 (H) 0.44 - 1.00 mg/dL   Calcium 8.9 8.9 - 03.5 mg/dL   Total Protein 6.4 (L) 6.5 - 8.1 g/dL   Albumin 3.4 (L) 3.5 - 5.0 g/dL   AST 19 15 - 41 U/L   ALT 15 0 - 44 U/L   Alkaline Phosphatase 81 38 - 126 U/L   Total Bilirubin 1.8 (H) 0.3 - 1.2 mg/dL   GFR, Estimated 22 (L) >60 mL/min    Comment:  (NOTE) Calculated using the CKD-EPI Creatinine Equation (2021)    Anion gap 12 5 - 15    Comment: Performed at [facility], [address], [place], [place] [address]  POC CBG, ED     Status: Abnormal   Collection Time: 06/15/20  7:20 PM  Result Value Ref Range   Glucose-Capillary 150 (H) 70 - 99 mg/dL    Comment: Glucose reference range applies only to samples taken after fasting for at least 8 hours.  Urinalysis, Routine w reflex microscopic Urine, Clean Catch     Status: Abnormal   Collection Time: 06/15/20  7:27 PM  Result Value Ref Range   Color, Urine AMBER (A) YELLOW    Comment: BIOCHEMICALS MAY BE AFFECTED BY COLOR   APPearance CLOUDY (A) CLEAR   Specific Gravity, Urine 1.027 1.005 - 1.030   pH 5.0 5.0 - 8.0   Glucose, UA NEGATIVE NEGATIVE mg/dL   Hgb urine dipstick NEGATIVE NEGATIVE   Bilirubin Urine NEGATIVE NEGATIVE   Ketones, ur 5 (A) NEGATIVE mg/dL   Protein, ur 30 (A) NEGATIVE mg/dL   Nitrite NEGATIVE NEGATIVE   Leukocytes,Ua NEGATIVE NEGATIVE   RBC / HPF 0-5 0 - 5 RBC/hpf   WBC, UA 0-5 0 - 5 WBC/hpf   Bacteria, UA MANY (A) NONE SEEN   Squamous Epithelial / LPF 11-20 0 - 5    Comment: Performed at [facility], [address] [place], [place] [address]  Rapid urine drug screen (hospital performed)     Status: None   Collection Time: 06/15/20  7:27 PM  Result Value Ref Range   Opiates NONE DETECTED NONE DETECTED   Cocaine NONE DETECTED NONE DETECTED   Benzodiazepines NONE DETECTED NONE DETECTED   Amphetamines NONE DETECTED NONE DETECTED   Tetrahydrocannabinol NONE DETECTED NONE DETECTED   Barbiturates NONE DETECTED NONE DETECTED    Comment: (NOTE) DRUG SCREEN FOR MEDICAL PURPOSES ONLY.  IF CONFIRMATION IS NEEDED FOR ANY PURPOSE, NOTIFY LAB WITHIN 5 DAYS.  LOWEST DETECTABLE LIMITS FOR URINE DRUG SCREEN Drug Class                     Cutoff (ng/mL) Amphetamine and metabolites    1000 Barbiturate and metabolites    200 Benzodiazepine                  200 Tricyclics and metabolites     300 Opiates and metabolites        300 Cocaine and metabolites        300 THC                            50 Performed at [facility], [address] [place], [place] [address]   D-dimer, quantitative     Status: Abnormal   Collection Time: 06/15/20  8:25 PM  Result Value Ref Range   D-Dimer, Quant 12.14 (H) 0.00 - 0.50 ug/mL-FEU    Comment: (NOTE) At the manufacturer cut-off value of 0.5 g/mL FEU, this assay has a negative predictive value of 95-100%.This assay is intended for use in conjunction with a clinical pretest probability (PTP) assessment model to exclude pulmonary embolism (PE) and deep venous thrombosis (DVT) in outpatients suspected of PE or DVT. Results should be correlated with clinical presentation. Performed at [facility], [address], [place], [place] [address]    DG Chest 1 View  Result Date: 06/15/2020 CLINICAL DATA:  Altered mental status. EXAM: CHEST  1 VIEW COMPARISON:  02/03/2018 FINDINGS: Normal sized heart. Gas distended colon beneath both hemidiaphragms. Poor  inspiration. Minimal bibasilar atelectasis. Thoracic spine degenerative changes. IMPRESSION: 1. Gas distended colon beneath both hemidiaphragms. This could be an indication of colonic ileus or obstruction. 2. Poor inspiration with minimal bibasilar atelectasis. Electronically Signed   By: Beckie Salts M.D.   On: 06/15/2020 15:13   CT Head Wo Contrast  Result Date: 06/15/2020 CLINICAL DATA:  Altered mental status. EXAM: CT HEAD WITHOUT CONTRAST TECHNIQUE: Contiguous axial images were obtained from the base of the skull through the vertex without intravenous contrast. COMPARISON:  Head CT 09/03/2013 FINDINGS: Brain: No hemorrhage. Progressive atrophy from prior exam. Advanced periventricular white matter hypodensity, left greater than right, stable from prior exam and likely sequela of chronic small vessel ischemia. No evidence of acute infarct. No  midline shift or mass effect. No subdural or extra-axial collection. Enlarged partially empty sella. Vascular: Atherosclerosis of skullbase vasculature without hyperdense vessel or abnormal calcification. Skull: No fracture or focal lesion. Sinuses/Orbits: Dystrophic left globe.  No acute findings. Other: None. IMPRESSION: 1. No acute intracranial abnormality. 2. Progressive atrophy from 2015. Advanced chronic small vessel ischemia. Electronically Signed   By: Narda Rutherford M.D.   On: 06/15/2020 16:07   CT CHEST ABDOMEN PELVIS WO CONTRAST  Result Date: 06/15/2020 CLINICAL DATA:  New onset respiratory failure and abdominal pain, initial encounter EXAM: CT CHEST, ABDOMEN AND PELVIS WITHOUT CONTRAST TECHNIQUE: Multidetector CT imaging of the chest, abdomen and pelvis was performed following the standard protocol without IV contrast. COMPARISON:  Chest x-ray from earlier in the same day, CT from 02/11/2018 FINDINGS: CT CHEST FINDINGS Cardiovascular: Limited due to lack of IV contrast. Atherosclerotic calcifications of the thoracic aorta are noted without aneurysmal dilatation. No cardiac enlargement is seen. No significant coronary calcifications are noted. Mediastinum/Nodes: Thoracic inlet is within normal limits. The esophagus as visualized is unremarkable. No hilar or mediastinal adenopathy is noted. Lungs/Pleura: Lungs are well aerated bilaterally. Patchy airspace opacities are noted in the lower lobes bilaterally consistent with early infiltrate. Associated small right-sided pleural effusion is noted as well. No sizable parenchymal nodule is seen. No pneumothorax is noted. Musculoskeletal: Degenerative changes of the thoracic spine are seen. No acute bony abnormality is noted. CT ABDOMEN PELVIS FINDINGS Hepatobiliary: Liver is within normal limits. Gallbladder demonstrates vicarious excretion of contrast material or multiple tiny stones. No complicating factors are seen. Pancreas: Unremarkable. No pancreatic  ductal dilatation or surrounding inflammatory changes. Spleen: Large rim calcification is noted centrally within the spleen stable in appearance from the prior exam and dating back to 2015. Adrenals/Urinary Tract: Adrenal glands are within normal limits. Kidneys demonstrate no obstructive change. Small exophytic cyst is noted from the right kidney stable in appearance from the prior exam. No renal calculi are seen. The bladder is decompressed. Stomach/Bowel: Increased fecal material is noted within the rectal vault consistent with the degree of impaction. The more proximal colon however is decompressed. The appendix is not well visualized although no inflammatory changes to suggest appendicitis are seen. Stomach is well distended with fluid and ingested food stuffs. The small bowel is dilated starting at the duodenum. Fluid-filled dilated loops of jejunum and ileum are seen extending to the level of the ascending colon. No discrete transitional zone is noted. Correlation with the physical exam is recommended as to the bowel sounds as this could represent a high-grade small bowel obstruction or small-bowel ileus. Vascular/Lymphatic: Aortic atherosclerosis. No enlarged abdominal or pelvic lymph nodes. Reproductive: Uterus and bilateral adnexa are unremarkable. Other: No abdominal wall hernia or abnormality. No abdominopelvic ascites. Musculoskeletal:  Degenerative changes of the lumbar spine are noted. No compression deformities are seen. IMPRESSION: CT of the chest: Patchy airspace opacities in the bases bilaterally consistent with early multifocal pneumonia. Small right-sided effusion is noted. CT of the abdomen and pelvis: Multiple dilated loops of small bowel extending from the duodenum to the level of the terminal ileum and ascending colon. No discrete lesion is seen. Correlation with the physical exam is recommended as this could represent a high-grade distal obstruction or diffuse small bowel ileus. Vicarious  excretion of contrast versus is multiple tiny stones in the gallbladder. No complicating factors are noted. Chronic changes in the spleen and kidneys stable from prior study. Electronically Signed   By: Alcide Clever M.D.   On: 06/15/2020 19:39       Assessment/Plan 80 yo female with an extensive medical history presenting with nausea/vomiting, and concern for aspiration, CVA, and ileus vs small bowel obstruction. I have personally reviewed her CT abd/pelvis. The stomach is markedly distended, and the small bowel is diffusely dilated. There does not appear to be a distinct transition zone. There is a significant stool burden in the rectum and sigmoid, consistent with impaction given history of constipation. Recommend placement of an NG tube for decompression. If an MRI is needed for stroke evaluation, patient should have NG decompression prior to being placed flat in the MRI as she is at extremely high risk of aspiration given her altered mental status and significant gastric distension. This is likely an adhesive small bowel obstruction or a reactive ileus. Patient has multiple active issues and medical comorbidities and is a very poor surgical candidate. Agree with goals of care discussion with family.  We will continue to follow along for management of obstruction.  Sophronia Simas, MD [facility] Surgery General, Hepatobiliary and Pancreatic Surgery 06/15/20 9:04 PM

## 2020-06-15 NOTE — ED Provider Notes (Addendum)
[facility] EMERGENCY DEPARTMENT Provider Note   CSN: [id number] Arrival date & time: 06/15/20  1348     History Chief Complaint  Patient presents with  . Altered Mental Status    [name] is a 80 y.o. female.  HPI  Presents with altered mental status.  History obtained from the nurse practitioner at the facility and the daughter as the patient is nonverbal.  Per the nurse practitioner, the patient threw up last night and then was unable to swallow this morning.  His speech therapist did an evaluation and instructed her to be n.p.o. because she would not respond to food being put in her mouth.  At baseline she will answer yes and no to certain questions and often be combative with exam but today she was not combative and not answering questions.  Chest x-ray is obtained which the nurse practitioner at the facility says is unremarkable and EMS was called due to the change in mental status.  The daughter at bedside reports that for the past week she has had a waning mental status and has seemed gradually less responsive.  No new focal neurologic deficit and she has baseline neurologic deficits which include right-sided gaze that deviation, right-sided weakness.  Does not wear oxygen at baseline but was transferred on 2 L of oxygen.     Past Medical History:  Diagnosis Date  . Anxiety disorder   . Atrial fibrillation (HCC)    a. new 04/2011;  b. Pradaxa  . Chronic diastolic heart failure (HCC)    Echocardiogram 04/14/11: Moderate LVH, EF 60-65%, mild LAE.  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 Gastritis   . H/O: hysterectomy   . Heart murmur    from age 4 associated with some hemoptysis  . Hypercholesteremia   . Hypertension   . Impaired glucose tolerance   . Obesity   . Osteoarthritis   . Parkinson's disease (HCC)    patient denies having parkinsons  . Stroke  [facility]) 2007   Residual R sided weakness, uses a wheelchair, walker at home    Patient Active Problem List   Diagnosis Date Noted  . Hypomagnesemia   . Colon distention   . Fecal impaction (HCC)   . Acute hypoxemic respiratory failure (HCC)   . Pressure injury of skin 02/04/2018  . Difficult intravenous access   . Constipation 01/27/2018  . Dementia due to Parkinson's disease with behavioral disturbance (HCC) 01/27/2018  . Hypokalemia 01/27/2018  . AKI (acute kidney injury) (HCC) 01/27/2018  . Leukocytosis 01/27/2018  . Acute on chronic respiratory failure with hypoxia (HCC) 01/27/2018  . Hyperbilirubinemia 01/27/2018  . Abdominal distention 01/27/2018  . Lower extremity weakness 09/03/2013  . Ambulatory dysfunction 09/03/2013  . Parkinson disease (HCC) 09/03/2013  . SVT (supraventricular tachycardia) (HCC) 03/23/2012  . Preop cardiovascular exam 07/03/2011  . Chest pain 04/26/2011  . PAF (paroxysmal atrial fibrillation) (HCC)   . Diastolic CHF (HCC)   . EDEMA 11/02/2008  . IMPAIRED GLUCOSE TOLERANCE 10/15/2008  . HYPERCHOLESTEROLEMIA 10/15/2008  . OBESITY 10/15/2008  . ANXIETY DISORDER 10/15/2008  . Essential hypertension 10/15/2008  . Coronary atherosclerosis 10/15/2008  . GASTRITIS 10/15/2008  . OSTEOARTHRITIS 10/15/2008    Past Surgical History:  Procedure Laterality Date  . CARPAL TUNNEL RELEASE     right  . 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  LEFT HEART CATHETERIZATION WITH CORONARY ANGIOGRAM N/A 05/03/2011   Procedure: LEFT HEART CATHETERIZATION WITH CORONARY ANGIOGRAM;  Surgeon: Herby Abraham, MD;  Location: [facility] CATH LAB;  Service: Cardiovascular;  Laterality: N/A;  . REPLACEMENT TOTAL KNEE     right  . TONSILLECTOMY       OB History   No obstetric history on file.     Family History  Problem Relation Age of Onset  . Cancer Mother 46       died  . Heart attack Father 67       died    Social History   Tobacco Use  . Smoking  status: Never Smoker  . Smokeless tobacco: Never Used  Substance Use Topics  . Alcohol use: No  . Drug use: No    Home Medications Prior to Admission medications   Medication Sig Start Date End Date Taking? Authorizing Provider  amiodarone (PACERONE) 200 MG tablet Take 1 tablet (200 mg total) by mouth daily. 02/13/18   Albertine Grates, MD  aspirin EC 81 MG tablet Take 1 tablet (81 mg total) by mouth daily. 09/04/13   Ghimire, Werner Lean, MD  atorvastatin (LIPITOR) 40 MG tablet Take 1 tablet (40 mg total) by mouth daily at 6 PM. 02/13/18   Albertine Grates, MD  bisacodyl (DULCOLAX) 10 MG suppository Place 1 suppository (10 mg total) rectally every Monday, Wednesday, and Friday. 02/13/18   Albertine Grates, MD  Cyanocobalamin (VITAMIN B 12 PO) Take 1 tablet by mouth daily.    [provider]  feeding supplement, ENSURE ENLIVE, (ENSURE ENLIVE) LIQD Take 237 mLs by mouth 2 (two) times daily between meals. 02/13/18   Albertine Grates, MD  ferrous sulfate 325 (65 FE) MG tablet Take 1 tablet (325 mg total) by mouth every Monday, Wednesday, and Friday for 24 days. 02/13/18 03/09/18  Albertine Grates, MD  folic acid (FOLVITE) 1 MG tablet Take 1 tablet (1 mg total) by mouth daily. 02/13/18   Albertine Grates, MD  Hydrocortisone (GERHARDT'S BUTT CREAM) CREA Apply 1 application topically daily as needed for irritation.    [provider]  magnesium oxide (MAG-OX) 400 MG tablet Take 1 tablet (400 mg total) by mouth daily. 02/13/18   Albertine Grates, MD  metoprolol tartrate (LOPRESSOR) 25 MG tablet Take 0.5 tablets (12.5 mg total) by mouth 2 (two) times daily. 02/13/18 03/15/18  Albertine Grates, MD  Multiple Vitamin (MULTIVITAMIN WITH MINERALS) TABS tablet Take 1 tablet by mouth daily. 02/13/18   Albertine Grates, MD  pantoprazole (PROTONIX) 40 MG tablet Take 1 tablet (40 mg total) by mouth daily. 02/13/18   Albertine Grates, MD  phosphorus (K PHOS NEUTRAL) [phone] MG tablet Take 1 tablet (250 mg total) by mouth every Monday, Wednesday, and Friday. 02/13/18   Albertine Grates, MD   polyethylene glycol [facility]) packet Take 17 g by mouth daily. 09/02/13   Horton, Mayer Masker, MD  potassium chloride SA (K-DUR,KLOR-CON) 20 MEQ tablet Take 2 tablets (40 mEq total) by mouth daily. 02/13/18   Albertine Grates, MD  senna-docusate (SENOKOT-S) 8.6-50 MG tablet Take 1 tablet by mouth 2 (two) times daily. 02/13/18   Albertine Grates, MD  Vitamin D, Ergocalciferol, (DRISDOL) 1.25 MG (50000 UT) CAPS capsule Take 50,000 Units by mouth every 30 (thirty) days. 15th    [provider]    Allergies    Sulfonamide derivatives  Review of Systems   Review of Systems  Unable to perform ROS: Mental status change    Physical Exam Updated Vital  Signs BP 135/83   Pulse 77   Temp 98 F (36.7 C) (Axillary)   Resp 13   Ht 5\' 2"  (1.575 m)   Wt 77.6 kg   SpO2 95%   BMI 31.29 kg/m   Physical Exam Vitals and nursing note reviewed.  Constitutional:      General: She is not in acute distress.    Appearance: She is well-developed.  HENT:     Head: Normocephalic and atraumatic.  Eyes:     Extraocular Movements: Extraocular movements intact.     Conjunctiva/sclera: Conjunctivae normal.  Cardiovascular:     Rate and Rhythm: Normal rate and regular rhythm.     Heart sounds: No murmur heard.   Pulmonary:     Effort: Pulmonary effort is normal. No respiratory distress.     Breath sounds: Normal breath sounds.  Abdominal:     Palpations: Abdomen is soft.     Tenderness: There is no abdominal tenderness.  Musculoskeletal:     Cervical back: Neck supple. No rigidity.     Right lower leg: No edema.     Left lower leg: No edema.  Skin:    General: Skin is warm and dry.     Capillary Refill: Capillary refill takes less than 2 seconds.  Neurological:     Mental Status: She is alert.     Comments: Right-sided gaze preference on the right eye.  Left eye enucleation.  Right hemibody weakness.  Has good coordination and strength on the left whenever pushing away examiner with exam.  Response to  pain in all 4 extremities.  Unable to test sensation.  Unable to test coordination.  Moans incomprehensibly, does not answer questions  Psychiatric:     Comments: UTA     ED Results / Procedures / Treatments   Labs (all labs ordered are listed, but only abnormal results are displayed) Labs Reviewed  CBC WITH DIFFERENTIAL/PLATELET - Abnormal; Notable for the following components:      Result Value   RBC 6.13 (*)    Hemoglobin 16.2 (*)    HCT 52.0 (*)    RDW 16.3 (*)    Monocytes Absolute 1.1 (*)    All other components within normal limits  URINE CULTURE  AMMONIA  URINALYSIS, ROUTINE W REFLEX MICROSCOPIC  LACTIC ACID, PLASMA  LACTIC ACID, PLASMA  RAPID URINE DRUG SCREEN, HOSP PERFORMED  COMPREHENSIVE METABOLIC PANEL    EKG EKG Interpretation  Date/Time:  Thursday Jun 15 2020 13:54:25 EDT Ventricular Rate:  81 PR Interval:  140 QRS Duration: 127 QT Interval:  441 QTC Calculation: 512 R Axis:   -79 Text Interpretation: Sinus rhythm RBBB and LAFB Abnormal lateral Q waves No acute changes No significant change since last tracing Confirmed by [id number] Derwood Kaplan) on 06/15/2020 3:18:39 PM   Radiology DG Chest 1 View  Result Date: 06/15/2020 CLINICAL DATA:  Altered mental status. EXAM: CHEST  1 VIEW COMPARISON:  02/03/2018 FINDINGS: Normal sized heart. Gas distended colon beneath both hemidiaphragms. Poor inspiration. Minimal bibasilar atelectasis. Thoracic spine degenerative changes. IMPRESSION: 1. Gas distended colon beneath both hemidiaphragms. This could be an indication of colonic ileus or obstruction. 2. Poor inspiration with minimal bibasilar atelectasis. Electronically Signed   By: 02/05/2018 M.D.   On: 06/15/2020 15:13   CT Head Wo Contrast  Result Date: 06/15/2020 CLINICAL DATA:  Altered mental status. EXAM: CT HEAD WITHOUT CONTRAST TECHNIQUE: Contiguous axial images were obtained from the base of the skull through the vertex without intravenous  contrast.  COMPARISON:  Head CT 09/03/2013 FINDINGS: Brain: No hemorrhage. Progressive atrophy from prior exam. Advanced periventricular white matter hypodensity, left greater than right, stable from prior exam and likely sequela of chronic small vessel ischemia. No evidence of acute infarct. No midline shift or mass effect. No subdural or extra-axial collection. Enlarged partially empty sella. Vascular: Atherosclerosis of skullbase vasculature without hyperdense vessel or abnormal calcification. Skull: No fracture or focal lesion. Sinuses/Orbits: Dystrophic left globe.  No acute findings. Other: None. IMPRESSION: 1. No acute intracranial abnormality. 2. Progressive atrophy from 2015. Advanced chronic small vessel ischemia. Electronically Signed   By: Narda RutherfordMelanie  Sanford M.D.   On: 06/15/2020 16:07    Procedures Procedures   Medications Ordered in ED Medications  lactated ringers bolus 1,000 mL (1,000 mLs Intravenous New Bag/Given 06/15/20 1450)    ED Course  I have reviewed the triage vital signs and the nursing notes.  Pertinent labs & imaging results that were available during my care of the patient were reviewed by me and considered in my medical decision making (see chart for details).    MDM Rules/Calculators/A&P                          Patient presents with altered mental status.  She is a DNR but the daughter also reports a desire for full treatment but no chest compressions.  The daughter also consents to a feeding tube if she should need it.  She will need long-term nutrition and short-term hydration based on her history and physical exam.  No focal neurologic deficit to suggest stroke.  Etiologies considered include aspiration pneumonia, urinary tract infection, hyperammonemia given that she takes lactulose.  Work-up ordered but was pending at time of handoff to Dr. Rhunette CroftNanavati at 1500. History and plan communicated.  Final Clinical Impression(s) / ED Diagnoses Final diagnoses:  None    Rx / DC  Orders ED Discharge Orders    None       Jacklynn BueStricklin, Serjio Deupree, MD 06/15/20 1612    Jacklynn BueStricklin, Jailyn Leeson, MD 06/15/20 1615    Derwood KaplanNanavati, Ankit, MD 06/15/20 1850    Derwood KaplanNanavati, Ankit, MD 06/15/20 2243

## 2020-06-15 NOTE — ED Notes (Signed)
MD made aware of critical Lactic Acid

## 2020-06-16 ENCOUNTER — Inpatient Hospital Stay (HOSPITAL_COMMUNITY): Payer: Medicare Other

## 2020-06-16 ENCOUNTER — Emergency Department (HOSPITAL_COMMUNITY): Payer: Medicare Other

## 2020-06-16 DIAGNOSIS — I5032 Chronic diastolic (congestive) heart failure: Secondary | ICD-10-CM | POA: Diagnosis not present

## 2020-06-16 DIAGNOSIS — E440 Moderate protein-calorie malnutrition: Secondary | ICD-10-CM | POA: Diagnosis present

## 2020-06-16 DIAGNOSIS — I959 Hypotension, unspecified: Secondary | ICD-10-CM

## 2020-06-16 DIAGNOSIS — I110 Hypertensive heart disease with heart failure: Secondary | ICD-10-CM | POA: Diagnosis present

## 2020-06-16 DIAGNOSIS — J9601 Acute respiratory failure with hypoxia: Secondary | ICD-10-CM | POA: Diagnosis present

## 2020-06-16 DIAGNOSIS — G9341 Metabolic encephalopathy: Secondary | ICD-10-CM | POA: Diagnosis present

## 2020-06-16 DIAGNOSIS — R6520 Severe sepsis without septic shock: Secondary | ICD-10-CM | POA: Diagnosis present

## 2020-06-16 DIAGNOSIS — Z515 Encounter for palliative care: Secondary | ICD-10-CM

## 2020-06-16 DIAGNOSIS — I82401 Acute embolism and thrombosis of unspecified deep veins of right lower extremity: Secondary | ICD-10-CM | POA: Diagnosis present

## 2020-06-16 DIAGNOSIS — R609 Edema, unspecified: Secondary | ICD-10-CM

## 2020-06-16 DIAGNOSIS — I824Y1 Acute embolism and thrombosis of unspecified deep veins of right proximal lower extremity: Secondary | ICD-10-CM

## 2020-06-16 DIAGNOSIS — I2510 Atherosclerotic heart disease of native coronary artery without angina pectoris: Secondary | ICD-10-CM | POA: Diagnosis present

## 2020-06-16 DIAGNOSIS — I452 Bifascicular block: Secondary | ICD-10-CM | POA: Diagnosis present

## 2020-06-16 DIAGNOSIS — I471 Supraventricular tachycardia: Secondary | ICD-10-CM | POA: Diagnosis not present

## 2020-06-16 DIAGNOSIS — R29810 Facial weakness: Secondary | ICD-10-CM | POA: Diagnosis present

## 2020-06-16 DIAGNOSIS — R9431 Abnormal electrocardiogram [ECG] [EKG]: Secondary | ICD-10-CM | POA: Diagnosis not present

## 2020-06-16 DIAGNOSIS — K56609 Unspecified intestinal obstruction, unspecified as to partial versus complete obstruction: Secondary | ICD-10-CM | POA: Diagnosis present

## 2020-06-16 DIAGNOSIS — I4819 Other persistent atrial fibrillation: Secondary | ICD-10-CM | POA: Diagnosis present

## 2020-06-16 DIAGNOSIS — E870 Hyperosmolality and hypernatremia: Secondary | ICD-10-CM | POA: Diagnosis not present

## 2020-06-16 DIAGNOSIS — N179 Acute kidney failure, unspecified: Secondary | ICD-10-CM | POA: Diagnosis present

## 2020-06-16 DIAGNOSIS — J690 Pneumonitis due to inhalation of food and vomit: Secondary | ICD-10-CM | POA: Diagnosis present

## 2020-06-16 DIAGNOSIS — I480 Paroxysmal atrial fibrillation: Secondary | ICD-10-CM

## 2020-06-16 DIAGNOSIS — R64 Cachexia: Secondary | ICD-10-CM | POA: Diagnosis present

## 2020-06-16 DIAGNOSIS — I5033 Acute on chronic diastolic (congestive) heart failure: Secondary | ICD-10-CM | POA: Diagnosis present

## 2020-06-16 DIAGNOSIS — G20 Parkinson's disease: Secondary | ICD-10-CM | POA: Diagnosis present

## 2020-06-16 DIAGNOSIS — F0390 Unspecified dementia without behavioral disturbance: Secondary | ICD-10-CM | POA: Diagnosis present

## 2020-06-16 DIAGNOSIS — Z66 Do not resuscitate: Secondary | ICD-10-CM | POA: Diagnosis present

## 2020-06-16 DIAGNOSIS — K567 Ileus, unspecified: Secondary | ICD-10-CM | POA: Diagnosis present

## 2020-06-16 DIAGNOSIS — R400 Somnolence: Secondary | ICD-10-CM | POA: Diagnosis present

## 2020-06-16 DIAGNOSIS — F0391 Unspecified dementia with behavioral disturbance: Secondary | ICD-10-CM

## 2020-06-16 DIAGNOSIS — Z789 Other specified health status: Secondary | ICD-10-CM

## 2020-06-16 DIAGNOSIS — I69351 Hemiplegia and hemiparesis following cerebral infarction affecting right dominant side: Secondary | ICD-10-CM | POA: Diagnosis not present

## 2020-06-16 DIAGNOSIS — E7800 Pure hypercholesterolemia, unspecified: Secondary | ICD-10-CM | POA: Diagnosis present

## 2020-06-16 DIAGNOSIS — A419 Sepsis, unspecified organism: Secondary | ICD-10-CM | POA: Diagnosis present

## 2020-06-16 DIAGNOSIS — Z20822 Contact with and (suspected) exposure to covid-19: Secondary | ICD-10-CM | POA: Diagnosis present

## 2020-06-16 DIAGNOSIS — I82441 Acute embolism and thrombosis of right tibial vein: Secondary | ICD-10-CM | POA: Diagnosis present

## 2020-06-16 DIAGNOSIS — Z7189 Other specified counseling: Secondary | ICD-10-CM

## 2020-06-16 LAB — COMPREHENSIVE METABOLIC PANEL
ALT: 7 U/L (ref 0–44)
AST: 14 U/L — ABNORMAL LOW (ref 15–41)
Albumin: <1 g/dL — ABNORMAL LOW (ref 3.5–5.0)
Alkaline Phosphatase: 24 U/L — ABNORMAL LOW (ref 38–126)
BUN: 31 mg/dL — ABNORMAL HIGH (ref 8–23)
CO2: 11 mmol/L — ABNORMAL LOW (ref 22–32)
Calcium: <4 mg/dL — CL (ref 8.9–10.3)
Chloride: >130 mmol/L (ref 98–111)
Creatinine, Ser: 0.89 mg/dL (ref 0.44–1.00)
GFR, Estimated: >60 mL/min (ref >60)
Glucose, Bld: 59 mg/dL — ABNORMAL LOW (ref 70–99)
Potassium: <2 mmol/L — CL (ref 3.5–5.1)
Sodium: 146 mmol/L — ABNORMAL HIGH (ref 135–145)
Total Bilirubin: 0.5 mg/dL (ref 0.3–1.2)
Total Protein: <3 g/dL — ABNORMAL LOW (ref 6.5–8.1)

## 2020-06-16 LAB — CBC
HCT: 42.2 % (ref 36.0–46.0)
Hemoglobin: 13.4 g/dL (ref 12.0–15.0)
MCH: 26.4 pg (ref 26.0–34.0)
MCHC: 31.8 g/dL (ref 30.0–36.0)
MCV: 83.2 fL (ref 80.0–100.0)
Platelets: 128 10*3/uL — ABNORMAL LOW (ref 150–400)
RBC: 5.07 MIL/uL (ref 3.87–5.11)
RDW: 15.9 % — ABNORMAL HIGH (ref 11.5–15.5)
WBC: 11.1 10*3/uL — ABNORMAL HIGH (ref 4.0–10.5)
nRBC: 0 % (ref 0.0–0.2)

## 2020-06-16 LAB — LACTIC ACID, PLASMA: Lactic Acid, Venous: 2.5 mmol/L (ref 0.5–1.9)

## 2020-06-16 LAB — TROPONIN I (HIGH SENSITIVITY): Troponin I (High Sensitivity): 16 ng/L (ref <18)

## 2020-06-16 LAB — SARS CORONAVIRUS 2 (TAT 6-24 HRS): SARS Coronavirus 2: NEGATIVE

## 2020-06-16 MED ORDER — AMIODARONE HCL IN DEXTROSE 360-4.14 MG/200ML-% IV SOLN
60.0000 mg/h | INTRAVENOUS | Status: AC
  Administered 2020-06-16: 60 mg/h via INTRAVENOUS
  Filled 2020-06-16: qty 200

## 2020-06-16 MED ORDER — SODIUM CHLORIDE 0.9 % IV SOLN: INTRAVENOUS | Status: DC

## 2020-06-16 MED ORDER — SODIUM CHLORIDE 0.9 % IV BOLUS
1000.0000 mL | Freq: Once | INTRAVENOUS | Status: AC
  Administered 2020-06-16: 1000 mL via INTRAVENOUS

## 2020-06-16 MED ORDER — ONDANSETRON HCL 4 MG PO TABS: 4.0000 mg | ORAL_TABLET | Freq: Four times a day (QID) | ORAL | Status: DC | PRN

## 2020-06-16 MED ORDER — SODIUM CHLORIDE 0.9 % IV SOLN
3.0000 g | Freq: Two times a day (BID) | INTRAVENOUS | Status: DC
  Administered 2020-06-16 – 2020-06-17 (×4): 3 g via INTRAVENOUS
  Filled 2020-06-16: qty 3
  Filled 2020-06-16: qty 8
  Filled 2020-06-16: qty 3
  Filled 2020-06-16 (×2): qty 8

## 2020-06-16 MED ORDER — LACTATED RINGERS IV SOLN: INTRAVENOUS | Status: DC

## 2020-06-16 MED ORDER — MAGNESIUM SULFATE 2 GM/50ML IV SOLN
2.0000 g | Freq: Once | INTRAVENOUS | Status: AC
  Administered 2020-06-17: 2 g via INTRAVENOUS
  Filled 2020-06-16: qty 50

## 2020-06-16 MED ORDER — ENOXAPARIN SODIUM 80 MG/0.8ML IJ SOSY
80.0000 mg | PREFILLED_SYRINGE | Freq: Two times a day (BID) | INTRAMUSCULAR | Status: DC
  Administered 2020-06-17: 80 mg via SUBCUTANEOUS
  Filled 2020-06-16: qty 0.8

## 2020-06-16 MED ORDER — DIATRIZOATE MEGLUMINE & SODIUM 66-10 % PO SOLN
90.0000 mL | Freq: Once | ORAL | Status: DC
  Filled 2020-06-16: qty 90

## 2020-06-16 MED ORDER — ALBUTEROL SULFATE (2.5 MG/3ML) 0.083% IN NEBU: 2.5000 mg | INHALATION_SOLUTION | Freq: Four times a day (QID) | RESPIRATORY_TRACT | Status: DC | PRN

## 2020-06-16 MED ORDER — ACETAMINOPHEN 325 MG PO TABS: 650.0000 mg | ORAL_TABLET | Freq: Four times a day (QID) | ORAL | Status: DC | PRN

## 2020-06-16 MED ORDER — SODIUM CHLORIDE 0.9 % IV BOLUS
500.0000 mL | Freq: Once | INTRAVENOUS | Status: AC
  Administered 2020-06-16: 500 mL via INTRAVENOUS

## 2020-06-16 MED ORDER — ONDANSETRON HCL 4 MG/2ML IJ SOLN: 4.0000 mg | Freq: Four times a day (QID) | INTRAMUSCULAR | Status: DC | PRN

## 2020-06-16 MED ORDER — CALCIUM GLUCONATE-NACL 1-0.675 GM/50ML-% IV SOLN
1.0000 g | Freq: Once | INTRAVENOUS | Status: AC
  Administered 2020-06-17: 1000 mg via INTRAVENOUS
  Filled 2020-06-16: qty 50

## 2020-06-16 MED ORDER — DIATRIZOATE MEGLUMINE & SODIUM 66-10 % PO SOLN
90.0000 mL | Freq: Once | ORAL | Status: AC
  Administered 2020-06-16: 90 mL via NASOGASTRIC
  Filled 2020-06-16: qty 90

## 2020-06-16 MED ORDER — ENOXAPARIN SODIUM 80 MG/0.8ML IJ SOSY
80.0000 mg | PREFILLED_SYRINGE | INTRAMUSCULAR | Status: DC
  Administered 2020-06-16: 80 mg via SUBCUTANEOUS
  Filled 2020-06-16: qty 0.8

## 2020-06-16 MED ORDER — SODIUM BICARBONATE 8.4 % IV SOLN
INTRAVENOUS | Status: DC
  Filled 2020-06-16: qty 1000

## 2020-06-16 MED ORDER — POTASSIUM CHLORIDE 2 MEQ/ML IV SOLN
INTRAVENOUS | Status: AC
  Filled 2020-06-16 (×4): qty 1000

## 2020-06-16 MED ORDER — POTASSIUM CHLORIDE 10 MEQ/100ML IV SOLN
10.0000 meq | INTRAVENOUS | Status: AC
  Administered 2020-06-17 (×4): 10 meq via INTRAVENOUS
  Filled 2020-06-16 (×4): qty 100

## 2020-06-16 MED ORDER — ACETAMINOPHEN 650 MG RE SUPP: 650.0000 mg | Freq: Four times a day (QID) | RECTAL | Status: DC | PRN

## 2020-06-16 MED ORDER — AMIODARONE HCL IN DEXTROSE 360-4.14 MG/200ML-% IV SOLN
30.0000 mg/h | INTRAVENOUS | Status: DC
  Administered 2020-06-16 – 2020-06-21 (×10): 30 mg/h via INTRAVENOUS
  Filled 2020-06-16 (×9): qty 200

## 2020-06-16 MED ORDER — TECHNETIUM TO 99M ALBUMIN AGGREGATED
3.0000 | Freq: Once | INTRAVENOUS | Status: AC | PRN
  Administered 2020-06-16: 3 via INTRAVENOUS

## 2020-06-16 NOTE — Consult Note (Signed)
Consultation Note Date: 06/16/2020   Patient Name: [name]  DOB: 1940-08-13  MRN: [id number]  Age / Sex: 80 y.o., female  PCP: [name] Glazier, MD Referring Physician: Norval Morton, MD  Reason for Consultation: Establishing goals of care, "question about plan of care"  HPI/Patient Profile: 80 y.o. female  with past medical history of stroke, Parkinson's disease, CHF, A. fib, bowel obstruction presented to [facility] ED on 06/15/2020 from [name] with [facility].  Staff noted patient was unable to swallow and also with right-sided facial droop.  Head CT was unremarkable.  CT chest/abdomen/pelvis was concerning for obstruction as well as aspiration pneumonia.  General surgery was consulted and recommended placement of NG tube for gastric decompression.  Patient was admitted on 06/15/2020 with acute metabolic encephalopathy, DVT of right leg, AKI, acute small bowel obstruction, and acute respiratory failure with hypoxia secondary to aspiration pneumonia.   Clinical Assessment and Goals of Care: I have reviewed medical records including EPIC notes, labs, and imaging. Received report from primary RN -no acute concerns.  Went to visit patient at bedside -daughter/[name] and sister/[name] were present. Patient was lying in bed awake and alert, but is not verbally responsive and unable to participate in conversation. No signs or non-verbal gestures of pain or discomfort noted. No respiratory distress, increased work of breathing, or secretions noted.  NGT is in place.  Patient is on 3 L acute O2 nasal cannula.  Met with [name] and [name]  to discuss diagnosis, prognosis, GOC, EOL wishes, disposition, and options.  I introduced Palliative Medicine as specialized medical care for people living with serious illness. It focuses on providing relief from the symptoms and stress of a serious illness. The goal is to improve  quality of life for both the patient and the family.  We discussed a brief life review of the patient as well as functional and nutritional status.  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  Her husband and son are both unfortunately deceased, leaving [name] her only surviving child.  Prior to hospitalization, patient was living at [facility] under long-term care where she has been since 2016.  The patient was diagnosed with Parkinson's "sometime before 2016" and due to her physical decline, which included problems with swallowing/speech as well as ambulation, required her to be admitted under long-term care.  The patient has not been able to walk, dress, bathe, feed herself since 2016.  Her facility has to use a lift to get her into a wheelchair.  Prior to Saturday night patient was eating/drinking well.  Family tell me that they started noticing a drastic decline in the patient's condition as of Saturday night.  We discussed patient's current illness and what it means in the larger context of patient's on-going co-morbidities.  Family have a clear understanding of the patient's acute medical situation but did review in detail per their request.  Family understand that Parkinson's disease and CHF are progressive, non-curable diseases underlying the patient's current acute  medical conditions. Natural disease trajectory and expectations at EOL were discussed. I attempted to elicit values and goals of care important to the patient. The difference between aggressive medical intervention and comfort care was considered in light of the patient's goals of care.   Hospice and Palliative Care services outpatient were explained and offered. Discussion on the difference between Palliative and Hospice care was had per family request. Palliative care and hospice have similar goals of managing symptoms, promoting comfort, improving quality of life, and maintaining a person's dignity. However,  palliative care may be offered during any phase of a serious illness, while hospice care is usually offered when a person is expected to live for 6 months or less.   Provided education and counseling at length on the philosophy and benefits of hospice care per family's request. Discussed that it offers a holistic approach to care in the setting of end-stage illness and is about supporting the patient where they are allowing nature to take it's course. Discussed the hospice team includes RNs, physicians, social workers, and chaplains. They can provide personal care, support for the family, and help keep patient out of the hospital.  Detailed discussion was had on the difference between residential versus home hospice.  Dr. Tamala [name] entered room and was present for part of discussion today -I appreciate his assistance in answering the family's medical questions on next steps.  At this time family are clear that they would like to continue current medical interventions with watchful waiting.  They are agreeable to continue to gain answers through test/procedures and will make stepwise decisions based off of that information.  Brief discussion was had around goals if patient continues to decline - family are open to continue [place] conversations around transition to full comfort care at that time.  Advance directives, concepts specific to code status, artificial feeding and hydration, and rehospitalization were considered and discussed.  Patient does not have a living will or HCPOA.   Offered chaplain support - family feel patient would appreciate prayer from chaplain.  Patient is of [employer].  Discussed with patient/family the importance of continued conversation with each other and the medical providers regarding overall plan of care and treatment options, ensuring decisions are within the context of the patient's values and GOCs.    Questions and concerns were addressed. The patient/family was  encouraged to call with questions and/or concerns. PMT card was provided.   Primary Decision Maker: NEXT OF KIN - daughter/[name]    SUMMARY OF RECOMMENDATIONS:  Continue current medical interventions with watchful waiting  Continue DNR/DNI as previously documented  If patient declines, family are open to continued conversations about transition to full comfort care  Family would like to get results from recommended tests/procedures and will make stepwise decisions from there  Chaplain consult placed for: Family's request for patient to receive prayer  Ongoing PMT discussions pending clinical course  PMT will continue to follow and support holistically   Code Status/Advance Care Planning:  DNR  Palliative Prophylaxis:   Aspiration, Bowel Regimen, Eye Care, Frequent Pain Assessment, Oral Care and Turn Reposition  Additional Recommendations (Limitations, Scope, Preferences):  No Tracheostomy  Psycho-social/Spiritual:   Desire for further Chaplaincy support:yes Created space and opportunity for patient and family to express thoughts and feelings regarding patient's current medical situation.   Emotional support and therapeutic listening provided.  Prognosis:   Unable to determine  Discharge Planning: To Be Determined      Primary Diagnoses: Present on Admission: .  SBO (small bowel obstruction) ([place])   I have reviewed the medical record, interviewed the patient and family, and examined the patient. The following aspects are pertinent.  Past Medical History:  Diagnosis Date  . Anxiety disorder   . Atrial fibrillation ([place])    a. new 04/2011;  b. Pradaxa  . Chronic diastolic heart failure (HCC)    Echocardiogram 04/14/11: Moderate LVH, EF 60-65%, mild LAE.  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 Gastritis   . H/O: hysterectomy   . Heart murmur    from  age 13 associated with some hemoptysis  . Hypercholesteremia   . Hypertension   . Impaired glucose tolerance   . Obesity   . Osteoarthritis   . Parkinson's disease ([place])    patient denies having parkinsons  . Stroke [facility]) 2007   Residual R sided weakness, uses a wheelchair, walker at home   Social History   Socioeconomic History  . Marital status: Widowed    Spouse name: Not on file  . Number of children: 2  . Years of education: college  . Highest education level: Not on file  Occupational History  . Occupation: retired  Tobacco Use  . Smoking status: Never Smoker  . Smokeless tobacco: Never Used  Substance and Sexual Activity  . Alcohol use: No  . Drug use: No  . Sexual activity: Not Currently  Other Topics Concern  . Not on file  Social History Narrative   Lives in [place] with son.  Patient is in assisted living right now [facility].  Widow.  Retired as a Training and development officer for [employer].   Member of [employer] [place]   Right handed.   Social Determinants of Health   Financial Resource Strain: Not on file  Food Insecurity: Not on file  Transportation Needs: Not on file  Physical Activity: Not on file  Stress: Not on file  Social Connections: Not on file   Family History  Problem Relation Age of Onset  . Cancer Mother 68       died  . Heart attack Father 14       died   Scheduled Meds: . diatrizoate meglumine-sodium  90 mL Per NG tube Once  . enoxaparin (LOVENOX) injection  80 mg Subcutaneous Q24H   Continuous Infusions: . sodium chloride 100 mL/hr at 06/16/20 1221  . ampicillin-sulbactam (UNASYN) IV Stopped (06/16/20 1316)   PRN Meds:.acetaminophen **OR** acetaminophen, albuterol, ondansetron **OR** ondansetron (ZOFRAN) IV Medications Prior to Admission:  Prior to Admission medications   Medication Sig Start Date End Date Taking? Authorizing Provider  amiodarone (PACERONE) 200 MG tablet Take 1 tablet (200 mg total) by mouth  daily. 02/13/18  Yes Florencia Reasons, MD  aspirin EC 81 MG tablet Take 1 tablet (81 mg total) by mouth daily. 09/04/13  Yes Ghimire, Henreitta Leber, MD  CALMOSEPTINE 0.44-20.6 % OINT Apply 1 application topically in the morning and at bedtime. To buttocks for protection 04/19/20  Yes [provider]  cefTRIAXone 1 g in dextrose 5 % 50 mL Inject 1 g into the vein once.   Yes [provider]  lactulose (CHRONULAC) 10 GM/15ML solution Take 15 mLs by mouth in the morning and at bedtime. 06/07/20  Yes [provider]  lactulose (CHRONULAC) 10 GM/15ML solution Take by mouth See admin instructions. 1 solution  by mouth one time only for constipation.   Yes  [provider]  levothyroxine (SYNTHROID) 50 MCG tablet Take 50 mcg by mouth daily. 04/29/20  Yes [provider]  LINZESS 145 MCG CAPS capsule Take 145 mcg by mouth daily. 06/08/20  Yes [provider]  metoprolol tartrate (LOPRESSOR) 25 MG tablet Take 0.5 tablets (12.5 mg total) by mouth 2 (two) times daily. Patient taking differently: Take 25 mg by mouth 2 (two) times daily. 02/13/18 03/15/18 Yes Florencia Reasons, MD  Multiple Vitamin (MULTIVITAMIN WITH MINERALS) TABS tablet Take 1 tablet by mouth daily. 02/13/18  Yes Florencia Reasons, MD  [facility] powder Apply 1 application topically See admin instructions. Apply to right groin bid x 10 days 05/31/20  Yes [provider]  pantoprazole (PROTONIX) 20 MG tablet Take 20 mg by mouth daily. 05/14/20  Yes [provider]  senna-docusate (SENOKOT-S) 8.6-50 MG tablet Take 1 tablet by mouth 2 (two) times daily. Patient taking differently: Take 2 tablets by mouth 2 (two) times daily. 02/13/18  Yes Florencia Reasons, MD  Sodium Chloride Flush (NORMAL SALINE FLUSH IV) Inject 1 L into the vein See admin instructions. 1 liter bolus as a single dose.   Yes [provider]  Vitamin D, Ergocalciferol, (DRISDOL) 1.25 MG (50000 UT) CAPS capsule Take 50,000 Units by mouth every 30 (thirty) days.  15th   Yes [provider]  atorvastatin (LIPITOR) 40 MG tablet Take 1 tablet (40 mg total) by mouth daily at 6 PM. Patient not taking: Reported on 06/16/2020 02/13/18   Florencia Reasons, MD  bisacodyl (DULCOLAX) 10 MG suppository Place 1 suppository (10 mg total) rectally every Monday, Wednesday, and Friday. Patient not taking: No sig reported 02/13/18   Florencia Reasons, MD  feeding supplement, ENSURE ENLIVE, (ENSURE ENLIVE) LIQD Take 237 mLs by mouth 2 (two) times daily between meals. Patient not taking: Reported on 06/16/2020 02/13/18   Florencia Reasons, MD  ferrous sulfate 325 (65 FE) MG tablet Take 1 tablet (325 mg total) by mouth every Monday, Wednesday, and Friday for 24 days. Patient not taking: Reported on 06/16/2020 02/13/18 03/09/18  Florencia Reasons, MD  folic acid (FOLVITE) 1 MG tablet Take 1 tablet (1 mg total) by mouth daily. Patient not taking: No sig reported 02/13/18   Florencia Reasons, MD  Hydrocortisone (GERHARDT'S BUTT CREAM) CREA Apply 1 application topically daily as needed for irritation. Patient not taking: Reported on 06/16/2020    [provider]  magnesium oxide (MAG-OX) 400 MG tablet Take 1 tablet (400 mg total) by mouth daily. Patient not taking: Reported on 06/16/2020 02/13/18   Florencia Reasons, MD  pantoprazole (PROTONIX) 40 MG tablet Take 1 tablet (40 mg total) by mouth daily. Patient not taking: No sig reported 02/13/18   Florencia Reasons, MD  phosphorus (K PHOS NEUTRAL) [phone] MG tablet Take 1 tablet (250 mg total) by mouth every Monday, Wednesday, and Friday. Patient not taking: No sig reported 02/13/18   Florencia Reasons, MD  polyethylene glycol [facility]) packet Take 17 g by mouth daily. Patient not taking: Reported on 06/16/2020 09/02/13   Horton, Barbette Hair, MD  potassium chloride SA (K-DUR,KLOR-CON) 20 MEQ tablet Take 2 tablets (40 mEq total) by mouth daily. Patient not taking: No sig reported 02/13/18   Florencia Reasons, MD   Allergies  Allergen Reactions  . Sulfonamide Derivatives Other (See Comments)     Unknown. Pt doesn't remember.   Review of Systems  Unable to perform ROS: Acuity of condition    Physical Exam Vitals and nursing note reviewed.  Constitutional:  General: She is not in acute distress.    Appearance: She is ill-appearing.  Pulmonary:     Effort: No respiratory distress.  Skin:    General: Skin is warm and dry.  Neurological:     Mental Status: She is alert. Mental status is at baseline. She is disoriented and confused.     Motor: Weakness present.  Psychiatric:        Speech: She is noncommunicative.     Vital Signs: BP 103/72   Pulse 90   Temp 98.9 F (37.2 C) (Axillary)   Resp 19   Ht '5\' 2"'  (1.575 m)   Wt 77.6 kg   SpO2 99%   BMI 31.29 kg/m  Pain Scale: PAINAD       SpO2: SpO2: 99 % O2 Device:SpO2: 99 % O2 Flow Rate: .O2 Flow Rate (L/min): 4 L/min  IO: Intake/output summary: No intake or output data in the 24 hours ending 06/16/20 1430  LBM:   Baseline Weight: Weight: 77.6 kg Most recent weight: Weight: 77.6 kg     Palliative Assessment/Data: PPS 10% due to n.p.o. status     Time In: 1515 Time Out: 1645 Time Total: 90 minutes  Greater than 50%  of this time was spent counseling and coordinating care related to the above assessment and plan.  Signed by: Lin Landsman, NP   Please contact Palliative Medicine Team phone at [phone] for questions and concerns.  For individual provider: See Amion  *Portions of this note are a verbal dictation therefore any spelling and/or grammatical errors are due to the "[place]" system interpretation.

## 2020-06-16 NOTE — Progress Notes (Signed)
After patient went to VQ scan which did not show any significant concern for pulmonary embolus she was started on amiodarone drip, blood pressures trended down as low as 78/47.  Patient was given an additional bolus of 500 mL of normal saline IV fluids and amiodarone drip have been temporarily placed on hold.  I discussed with family that overall her prognosis appears to be poor at this point in time.  Discussed the possible need of pressors to maintain her mean arterial pressure and made note that this may not change her overall outcome.  At this time family would like me to contact critical care to evaluate her and possibly start the patient on pressors if needed.  Another 1 L bolus of IV fluids was ordered for the patient.

## 2020-06-16 NOTE — ED Notes (Signed)
Hospice/palliative care NP and R.Smith, MD at bedside to see pt and speak with family about plan of care.

## 2020-06-16 NOTE — Progress Notes (Signed)
   06/16/20 2056  Assess: MEWS Score  Temp 98.1 F (36.7 C)  BP (!) 92/58  Pulse Rate (!) 118  ECG Heart Rate (!) 119  Resp 16  Level of Consciousness Responds to Voice  SpO2 94 %  O2 Device Nasal Cannula  Patient Activity (if Appropriate) In bed  O2 Flow Rate (L/min) 4 L/min  Assess: MEWS Score  MEWS Temp 0  MEWS Systolic 1  MEWS Pulse 2  MEWS RR 0  MEWS LOC 1  MEWS Score 4  MEWS Score Color Red  Assess: if the MEWS score is Yellow or Red  Were vital signs taken at a resting state? Yes  Focused Assessment No change from prior assessment  Early Detection of Sepsis Score *See Row Information* Medium  MEWS guidelines implemented *See Row Information* Yes  Treat  MEWS Interventions Administered scheduled meds/treatments;Escalated (See documentation below)  Pain Scale PAINAD  Breathing 0  Negative Vocalization 1  Facial Expression 0  Body Language 1  Consolability 0  PAINAD Score 2  Neuro symptoms relieved by Rest  Take Vital Signs  Increase Vital Sign Frequency  Red: Q 1hr X 4 then Q 4hr X 4, if remains red, continue Q 4hrs  Escalate  MEWS: Escalate Red: discuss with charge nurse/RN and provider, consider discussing with RRT  Notify: Charge Nurse/RN  Name of Charge Nurse/RN Notified Jessica, RN  Date Charge Nurse/RN Notified 06/16/20  Time Charge Nurse/RN Notified 2057  Document  Patient Outcome Not stable and remains on department  Progress note created (see row info) Yes

## 2020-06-16 NOTE — Plan of Care (Signed)
  Problem: Education: Goal: Knowledge of General Education information will improve Description: Including pain rating scale, medication(s)/side effects and non-pharmacologic comfort measures Outcome: Not Progressing   Problem: Clinical Measurements: Goal: Ability to maintain clinical measurements within normal limits will improve Outcome: Not Progressing   Problem: Clinical Measurements: Goal: Diagnostic test results will improve Outcome: Not Progressing   Problem: Clinical Measurements: Goal: Cardiovascular complication will be avoided Outcome: Not Progressing   Problem: Coping: Goal: Level of anxiety will decrease Outcome: Not Progressing   Problem: Skin Integrity: Goal: Risk for impaired skin integrity will decrease Outcome: Not Progressing

## 2020-06-16 NOTE — Consult Note (Addendum)
Cardiology Consultation:   Patient ID: [name] MRN: [id number]; DOB: 1940-09-05  Admit date: 06/15/2020 Date of Consult: 06/16/2020  PCP:  Nadara Eaton, MD   [facility] Providers Cardiologist:  Jodelle Red, MD  Electrophysiologist:  Hillis Range, MD       Patient Profile:   [name] is a 80 y.o. female with a hx of remote PAF (patient refused anticoagulation in the past), Parkinson's disease, dementia, nonobstructive CAD by cath 2013, anemia, anxiety, RBBB, chronic diastolic CHF, HTN, HLD, obesity, osteoarthritis who is being seen 06/16/2020 for the evaluation of rapid atrial fib at the request of Dr. Madelyn Flavors.  History of Present Illness:   [name] has a history of nonobstructive CAD by cath in 2013 as well as PAF. She previously declined to take anticoagulation. Last office follow-up was in 2015. She was seen brief in the hospital in 01/2018-02/2018 for recurrent paroxysms of atrial fib at which time she was started on amiodarone. That hospital course was notable for hypotension and anemia at that time. Has not had cardiology f/u since that time.   History obtained from chart since patient is lethargic and unable to provide history and family not present. Per report at baseline she is reported to answer yes/no questions and sometimes recognizes family members but otherwise not very active. She was transferred from SNF for inability to swallow, possible aspiration with emesis and possible right facial droop. CT head nonacute but progressive atrophy. She had a R DVT on duplex. She was also noted to have SBO being managed conservatively with NGT. Initial labs showed d-dimer 12, lactic acidosis 2.2, new AKI with Cr 2.15, Hgb 16.2.  While in the MRI scanner today she was noted to be progressively more hypotensive in the 70s and tachycardic in the 130s-150s (rapid afib). Initial tracing yesterday on arrival was sinus tach. MRI brain showed motion degredation,  progressive cerebral atrophy, and "progressed from this prior exam, there are fairly numerous chronic microhemorrhages within the supratentorial brain, raising the possibility of cerebral amyloid angiopathy. However, an atypical appearance of chronic hypertensive microangiopathy is also possible." Per nurse report, this was fluid responsive initially. The patient then went to VQ scan which was negative for PE and then began to get hypotensive again including down to the 60s systolic. She is being treated with additional IV fluids, antibiotics and was started on IV amiodarone drip without bolus. Palliative care note from today outlines plans to continue medical rx but consider transition to comfort care if progressive decline with DNR/DNI.  Awaiting labs today. Last BP 96/64.  Past Medical History:  Diagnosis Date   Anxiety disorder    Atrial fibrillation (HCC)    a. new 04/2011;  b. Pradaxa   Chronic diastolic heart failure (HCC)    Echocardiogram 04/14/11: Moderate LVH, EF 60-65%, mild LAE.   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 without critical disease   Gastritis    H/O: hysterectomy    Heart murmur    from age 31 associated with some hemoptysis   Hypercholesteremia    Hypertension    Impaired glucose tolerance    Obesity    Osteoarthritis    Parkinson's disease (HCC)    patient denies having parkinsons   Stroke [facility]) 2007   Residual R sided weakness, uses a wheelchair, walker at home    Past Surgical History:  Procedure Laterality Date   ABDOMINAL HYSTERECTOMY  CARPAL TUNNEL RELEASE     right   EYE SURGERY     left, 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 Authorizing Provider  amiodarone (PACERONE) 200 MG tablet Take 1 tablet (200 mg total) by mouth daily. 02/13/18  Yes Albertine GratesXu, Fang, MD  aspirin EC 81 MG tablet Take 1 tablet (81 mg total) by mouth daily. 09/04/13  Yes Ghimire, Werner LeanShanker M, MD  CALMOSEPTINE 0.44-20.6 % OINT Apply 1 application topically in the morning and at bedtime. To buttocks for protection 04/19/20  Yes [provider]  cefTRIAXone 1 g in dextrose 5 % 50 mL Inject 1 g into the vein once.   Yes [provider]  lactulose (CHRONULAC) 10 GM/15ML solution Take 15 mLs by mouth in the morning and at bedtime. 06/07/20  Yes [provider]  lactulose (CHRONULAC) 10 GM/15ML solution Take by mouth See admin instructions. 1 solution  by mouth one time only for constipation.   Yes [provider]  levothyroxine (SYNTHROID) 50 MCG tablet Take 50 mcg by mouth daily. 04/29/20  Yes [provider]  LINZESS 145 MCG CAPS capsule Take 145 mcg by mouth daily. 06/08/20  Yes [provider]  metoprolol tartrate (LOPRESSOR) 25 MG tablet Take 0.5 tablets (12.5 mg total) by mouth 2 (two) times daily. Patient taking differently: Take 25 mg by mouth 2 (two) times daily. 02/13/18 03/15/18 Yes Albertine GratesXu, Fang, MD  Multiple Vitamin (MULTIVITAMIN WITH MINERALS) TABS tablet Take 1 tablet by mouth daily. 02/13/18  Yes Albertine GratesXu, Fang, MD  [facility] powder Apply 1 application topically See admin instructions. Apply to right groin bid x 10 days 05/31/20  Yes [provider]  pantoprazole (PROTONIX) 20 MG tablet Take 20 mg by mouth daily. 05/14/20  Yes [provider]  senna-docusate (SENOKOT-S) 8.6-50 MG tablet Take 1 tablet by mouth 2 (two) times daily. Patient taking differently: Take 2 tablets by mouth 2 (two) times daily. 02/13/18  Yes Albertine GratesXu, Fang, MD  Sodium Chloride Flush (NORMAL SALINE FLUSH IV) Inject 1 L into  the vein See admin instructions. 1 liter bolus as a single dose.   Yes [provider]  Vitamin D, Ergocalciferol, (DRISDOL) 1.25 MG (50000 UT) CAPS capsule Take 50,000 Units by mouth every 30 (thirty) days. 15th   Yes [provider]  atorvastatin (LIPITOR) 40 MG tablet Take 1 tablet (40 mg total) by mouth daily at 6 PM. Patient not taking: Reported on 06/16/2020 02/13/18   Albertine GratesXu, Fang, MD  bisacodyl (DULCOLAX) 10 MG suppository Place 1 suppository (10 mg total) rectally every Monday, Wednesday, and Friday. Patient not taking: No sig reported 02/13/18   Albertine GratesXu, Fang, MD  feeding supplement, ENSURE ENLIVE, (ENSURE ENLIVE) LIQD Take 237 mLs by mouth 2 (two) times daily between meals. Patient not taking: Reported on 06/16/2020 02/13/18   Albertine GratesXu, Fang, MD  ferrous sulfate 325 (65 FE) MG tablet Take 1 tablet (325 mg total) by mouth every Monday, Wednesday, and Friday for 24 days. Patient not taking: Reported on 06/16/2020 02/13/18 03/09/18  Albertine GratesXu, Fang, MD  folic acid (FOLVITE) 1 MG  tablet Take 1 tablet (1 mg total) by mouth daily. Patient not taking: No sig reported 02/13/18   Albertine Grates, MD  Hydrocortisone (GERHARDT'S BUTT CREAM) CREA Apply 1 application topically daily as needed for irritation. Patient not taking: Reported on 06/16/2020    [provider]  magnesium oxide (MAG-OX) 400 MG tablet Take 1 tablet (400 mg total) by mouth daily. Patient not taking: Reported on 06/16/2020 02/13/18   Albertine Grates, MD  pantoprazole (PROTONIX) 40 MG tablet Take 1 tablet (40 mg total) by mouth daily. Patient not taking: No sig reported 02/13/18   Albertine Grates, MD  phosphorus (K PHOS NEUTRAL) [phone] MG tablet Take 1 tablet (250 mg total) by mouth every Monday, Wednesday, and Friday. Patient not taking: No sig reported 02/13/18   Albertine Grates, MD  polyethylene glycol [facility]) packet Take 17 g by mouth daily. Patient not taking: Reported on 06/16/2020 09/02/13   Horton, Mayer Masker, MD  potassium chloride SA  (K-DUR,KLOR-CON) 20 MEQ tablet Take 2 tablets (40 mEq total) by mouth daily. Patient not taking: No sig reported 02/13/18   Albertine Grates, MD    Inpatient Medications: Scheduled Meds:  enoxaparin (LOVENOX) injection  80 mg Subcutaneous Q24H   Continuous Infusions:  sodium chloride 100 mL/hr at 06/16/20 1221   amiodarone 60 mg/hr (06/16/20 1727)   amiodarone     ampicillin-sulbactam (UNASYN) IV Stopped (06/16/20 1316)   PRN Meds: acetaminophen **OR** acetaminophen, albuterol  Allergies:    Allergies  Allergen Reactions   Sulfonamide Derivatives Other (See Comments)    Unknown. Pt doesn't remember.    Social History:   Social History   Socioeconomic History   Marital status: Widowed    Spouse name: Not on file   Number of children: 2   Years of education: college   Highest education level: Not on file  Occupational History   Occupation: retired  Tobacco Use   Smoking status: Never Smoker   Smokeless tobacco: Never Used  Substance and Sexual Activity   Alcohol use: No   Drug use: No   Sexual activity: Not Currently  Other Topics Concern   Not on file  Social History Narrative   Lives in [place] with son.  Patient is in assisted living right now [facility].  Widow.  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   Social Determinants of Health   Financial Resource Strain: Not on file  Food Insecurity: Not on file  Transportation Needs: Not on file  Physical Activity: Not on file  Stress: Not on file  Social Connections: Not on file  Intimate Partner Violence: Not on file    Family History:    Family History  Problem Relation Age of Onset   Cancer Mother 75       died   Heart attack Father 7       died     ROS:  Unable to reliably obtain as patient is obtunded  Physical Exam/Data:   Vitals:   06/16/20 1840 06/16/20 1845 06/16/20 1850 06/16/20 1855  BP: 93/71 96/73 122/77 96/64   Pulse:      Resp: 13 14 15 14   Temp:      TempSrc:      SpO2:      Weight:      Height:        Intake/Output Summary (Last 24 hours) at 06/16/2020 1859 Last data filed at 06/16/2020 06/18/2020  Gross per 24 hour  Intake 500 ml  Output --  Net 500 ml   Last 3 Weights 06/15/2020 02/13/2018 02/12/2018  Weight (lbs) 171 lb 1.2 oz 171 lb 1.2 oz 172 lb 6.4 oz  Weight (kg) 77.6 kg 77.6 kg 78.2 kg     Body mass index is 31.29 kg/m.  General: Debilitated appearing AAF in no acute distress, lethargic Head: Normocephalic, atraumatic, sclera non-icteric, no xanthomas, nares are without discharge. Neck: Negative for carotid bruits. JVP not elevated. Lungs: Clear anteriorly without wheezing or rhonchi, no labored breathing Heart: Irregular, rapid, S1 S2 without murmurs, rubs, or gallops.  Abdomen: Mildly distended, no rebound or guarding, decreased BS Extremities: No clubbing or cyanosis. Mild LE bilaterally Neuro: Lethargic, minimally arousable to loud voice, does not follow commands - notified IM MD of decreased responsiveness Psych:  Obtunded  EKG:  The EKG was personally reviewed and demonstrates:  1) yesterday's initial tracing sinus tach with baseline RBBB, LAFB, prior inferolateral infarct 2) probable atrial fib 141bpm with RBBB, LAFB, prior inferolateral infarct, no acute STT changes from baseline  Telemetry:  Telemetry was personally reviewed and demonstrates:  Current telemetry while in this room shows persistent atrial fib  Relevant CV Studies:  2d echo 01/2018 - Procedure narrative: Transthoracic echocardiography. Image    quality was poor. The study was technically difficult, as a    result of poor acoustic windows.  - Left ventricle: The cavity size was mildly reduced. Systolic    function was normal. The estimated ejection fraction was in the    range of 55% to 60%. Probable mild hypokinesis of the    basal-midinferolateral myocardium.  - Right ventricle: Systolic function was  mildly reduced.  - Pericardium, extracardiac: A small to moderate pericardial    effusion was identified. There was no evidence of hemodynamic    compromise.   Impressions:   - Very technically difficult study. Overall LV EF is preserved.    There appears to be mild hypokinesis of the inferolateral wall,    but views of this are limited. There is a small to moderate    pericardial effusion without any echo evidence of tamponade.    There appears to be echo dense material in the pericardial space    along the anterior surface of the heart, consider pericardial fat    vs. fibrinous material vs. clot.   Cardiac Cath 2013 Cardiac Catheterization Procedure Note   Name: [name] MRN: [id number] DOB: August 22, 1940   Procedure: Left Heart Cath, Selective Coronary Angiography, LV angiography, Angioseal closure   Indication: Ms. Renk is well known to me.  She presented with atrial fib with rapid ventricular response. This was associated with chest pain.  See dc summary.  She has continued to have some discomfort, and with known prior disease, cardiac cath was felt to be indicated.  Pradaxa has been held for three days.  This was confirmed by her family who provides her medications for her.  Risks were reviewed and she was amenable to proceeding.  We know from prior cath that her entire coronary system arises from the right coronary cusp, and this was slightly difficult to engage previously.  As such we elected to attempt 89f catheters from the groin.                                       Procedural details: The right groin  was prepped, draped, and anesthetized with 1% lidocaine. Using front wall entry after confirmation of landmarks, a 4 French sheath was introduced into the right femoral artery and two views of the entry site confirmed for later angioseal use.  A single 3DRC catheter was used for coronary angiography.  The pigtail was used for  left ventriculography. Catheter exchanges were  performed over a guidewire. There were no immediate procedural complications.  The groin was reprepped, and gloves exchanged.  A 5/6 Angioseal was used for closure, and good hemostasis achieved.    The patient was transferred to the post catheterization recovery area for further monitoring.   Procedural Findings: Hemodynamics:   AO 145/77 (105) LV 155/7/17 Approximate 80mm gradient on pullback.              Coronary angiography: Coronary dominance: right   Left mainstem: There is a left main that arises from the RCC. It provides an LAD system and a posterior groove branch.  The LAD has a mild are of narrowing at its takeoff at a bend site, but it appears less than 50%.  There is mild irregularity throughout the LAD and it wraps the apex.  The apical portion, small, has about 50-57 diffuse plaque, but is small in caliber.  There is an PDA, PLA that comes from this vessel as well, and it has a  50% mid narrowing, and a long area of plaque leading into the proximal mid PDA territory retrograde that is probably about 70% and diffuse.  This would be difficult to navigate  (see films)   Left circumflex (LCx): arises from a common ostium with the RCA.  Importantly, it is large.  There is a smaller superior branch with mild irregularity, but the vessel contains no critical stenosis.     Right coronary artery (RCA):  Non dominant and comes off with two RV branches with some mild irregularity.  The inferior wall is supplied mostly from the retrograde branch from the mainstem LMCA  (which arises from the RCA cusp as noted)   Left ventriculography: Left ventricular systolic function is normal, LVEF is estimated at 55-65%, there is no significant mitral regurgitation.   Final Conclusions:   1.  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  Medical management would seem most appropriate at this time.   [name] 05/03/2011, 9:01  AM  Laboratory Data:  High Sensitivity Troponin:  No results for input(s): TROPONINIHS in the last 720 hours.   Chemistry Recent Labs  Lab 06/15/20 1656  NA 143  K 4.2  CL 105  CO2 26  GLUCOSE 132*  BUN 55*  CREATININE 2.15*  CALCIUM 8.9  GFRNONAA 22*  ANIONGAP 12    Recent Labs  Lab 06/15/20 1656  PROT 6.4*  ALBUMIN 3.4*  AST 19  ALT 15  ALKPHOS 81  BILITOT 1.8*   Hematology Recent Labs  Lab 06/15/20 1410  WBC 8.5  RBC 6.13*  HGB 16.2*  HCT 52.0*  MCV 84.8  MCH 26.4  MCHC 31.2  RDW 16.3*  PLT 253   BNPNo results for input(s): BNP, PROBNP in the last 168 hours.  DDimer  Recent Labs  Lab 06/15/20 2025  DDIMER 12.14*     Radiology/Studies:  DG Chest 1 View  Result Date: 06/15/2020 CLINICAL DATA:  Altered mental status. EXAM: CHEST  1 VIEW COMPARISON:  02/03/2018 FINDINGS: Normal sized heart. Gas distended colon beneath both hemidiaphragms. Poor inspiration. Minimal bibasilar  atelectasis. Thoracic spine degenerative changes. IMPRESSION: 1. Gas distended colon beneath both hemidiaphragms. This could be an indication of colonic ileus or obstruction. 2. Poor inspiration with minimal bibasilar atelectasis. Electronically Signed   By: Beckie Salts M.D.   On: 06/15/2020 15:13   CT Head Wo Contrast  Result Date: 06/15/2020 CLINICAL DATA:  Altered mental status. EXAM: CT HEAD WITHOUT CONTRAST TECHNIQUE: Contiguous axial images were obtained from the base of the skull through the vertex without intravenous contrast. COMPARISON:  Head CT 09/03/2013 FINDINGS: Brain: No hemorrhage. Progressive atrophy from prior exam. Advanced periventricular white matter hypodensity, left greater than right, stable from prior exam and likely sequela of chronic small vessel ischemia. No evidence of acute infarct. No midline shift or mass effect. No subdural or extra-axial collection. Enlarged partially empty sella. Vascular: Atherosclerosis of skullbase vasculature without hyperdense  vessel or abnormal calcification. Skull: No fracture or focal lesion. Sinuses/Orbits: Dystrophic left globe.  No acute findings. Other: None. IMPRESSION: 1. No acute intracranial abnormality. 2. Progressive atrophy from 2015. Advanced chronic small vessel ischemia. Electronically Signed   By: Narda Rutherford M.D.   On: 06/15/2020 16:07   MR BRAIN WO CONTRAST  Result Date: 06/16/2020 CLINICAL DATA:  Neuro deficit, acute, stroke suspected. EXAM: MRI HEAD WITHOUT CONTRAST TECHNIQUE: Multiplanar, multiecho pulse sequences of the brain and surrounding structures were obtained without intravenous contrast. COMPARISON:  Prior head CT examinations 06/15/2020 and earlier. Brain MRI 09/05/2013. FINDINGS: Brain: Intermittently motion degraded examination. Most notably, there is mild-to-moderate motion degradation of the axial T2/FLAIR sequence. Moderate cerebral atrophy. Commensurate prominence of the ventricles and sulci. Comparatively mild cerebellar atrophy. Severe cerebral white matter chronic small vessel ischemic disease, progressed from the brain MRI of 09/05/2013. This includes a small chronic lacunar infarct within the left corona radiata/basal ganglia. There are fairly numerous scattered tiny foci of SWI signal loss compatible with chronic microhemorrhages within the supratentorial brain. There is no acute infarct. No evidence of intracranial mass. No extra-axial fluid collection. No midline shift. Partially empty sella turcica. Vascular: Signal abnormality within the intracranial left vertebral artery, severe stenosis or vessel occlusion at this site, unchanged from the brain MRI of 09/05/2013. Flow voids otherwise preserved within the proximal large arterial vessels. Skull and upper cervical spine: No focal marrow lesion. Incompletely assessed cervical spondylosis. Multilevel in least mild spinal canal stenosis within the visualized upper cervical spine. Sinuses/Orbits: Redemonstrated left phthisis bulbi.  Prior right lens replacement. Trace bilateral ethmoid sinus mucosal thickening. IMPRESSION: Mild-to-moderate intermittent motion degradation, as described. No evidence of acute intracranial abnormality. Moderate cerebral atrophy and severe cerebral white matter chronic small vessel ischemic disease, progressed from the brain MRI of 09/05/2013. Also progressed from this prior exam, there are fairly numerous chronic microhemorrhages within the supratentorial brain, raising the possibility of cerebral amyloid angiopathy. However, an atypical appearance of chronic hypertensive microangiopathy is also possible. Unchanged from the prior MRI of 09/05/2013, there is signal abnormality within the intracranial left vertebral artery suggestive of high-grade stenosis or vessel occlusion at this site. Redemonstrated left phthisis bulbi. Electronically Signed   By: Jackey Loge DO   On: 06/16/2020 15:21   NM Pulmonary Perfusion  Result Date: 06/16/2020 CLINICAL DATA:  Appy at pain EXAM: NUCLEAR MEDICINE PERFUSION LUNG SCAN TECHNIQUE: Perfusion images were obtained in multiple projections after intravenous injection of radiopharmaceutical. Views: Anterior, posterior, left lateral, right lateral, RPO, LPO, RAO, LAO RADIOPHARMACEUTICALS:  3.0 mCi Tc-31m MAA IV COMPARISON:  Chest radiograph Jun 15, 2020 FINDINGS: The distribution of radiotracer  uptake bilaterally is homogeneous and symmetric. No focal perfusion defects are appreciable. IMPRESSION: No appreciable perfusion defects. There are no findings which are felt to be indicative of pulmonary embolus. Electronically Signed   By: Bretta Bang III M.D.   On: 06/16/2020 16:53   DG Abd Portable 1V-Small Bowel Protocol-Position Verification  Result Date: 06/16/2020 CLINICAL DATA:  Status post nasogastric tube placement. EXAM: PORTABLE ABDOMEN - 1 VIEW COMPARISON:  CT abdomen and pelvis dated 06/15/2020 FINDINGS: Enteric tube has been placed and appears adequately  positioned in the stomach. Gas-filled dilated small bowel loops are again appreciated throughout the abdomen, better demonstrated on yesterday's CT, compatible with obstruction versus ileus. IMPRESSION: 1. Enteric tube appears adequately positioned in the stomach. 2. Continued evidence of small bowel obstruction versus ileus. Electronically Signed   By: Bary Richard M.D.   On: 06/16/2020 11:08   VAS [place] LOWER EXTREMITY VENOUS (DVT) (ONLY [facility] & [facility])  Result Date: 06/16/2020  Lower Venous DVT Study Patient Name:  ANNALISIA INGBER  Date of Exam:   06/16/2020 Medical Rec #: [id number]         Accession #:    [id number] Date of Birth: 11-26-1940          Patient Gender: F Patient Age:   80Y Exam Location:  [facility] Procedure:      VAS [place] LOWER EXTREMITY VENOUS (DVT) Referring Phys: [id number] ANKIT NANAVATI --------------------------------------------------------------------------------  Indications: Edema.  Comparison Study: no prior Performing Technologist: Blanch Media RVS  Examination Guidelines: A complete evaluation includes B-mode imaging, spectral Doppler, color Doppler, and power Doppler as needed of all accessible portions of each vessel. Bilateral testing is considered an integral part of a complete examination. Limited examinations for reoccurring indications may be performed as noted. The reflux portion of the exam is performed with the patient in reverse Trendelenburg.  +---------+---------------+---------+-----------+----------+-------------------+ RIGHT    CompressibilityPhasicitySpontaneityPropertiesThrombus Aging      +---------+---------------+---------+-----------+----------+-------------------+ CFV      None           Yes      Yes                  Age Indeterminate   +---------+---------------+---------+-----------+----------+-------------------+ SFJ      None                                         Age Indeterminate    +---------+---------------+---------+-----------+----------+-------------------+ FV Prox  None                                         Age Indeterminate   +---------+---------------+---------+-----------+----------+-------------------+ FV Mid   Partial                                      Age Indeterminate   +---------+---------------+---------+-----------+----------+-------------------+ FV DistalPartial                                      Age Indeterminate   +---------+---------------+---------+-----------+----------+-------------------+ PFV      Partial  Age Indeterminate   +---------+---------------+---------+-----------+----------+-------------------+ POP      Partial        Yes      Yes                  Age Indeterminate   +---------+---------------+---------+-----------+----------+-------------------+ PTV      Partial                                      Age Indeterminate   +---------+---------------+---------+-----------+----------+-------------------+ PERO                                                  Not well visualized +---------+---------------+---------+-----------+----------+-------------------+   +---------+---------------+---------+-----------+----------+--------------+ LEFT     CompressibilityPhasicitySpontaneityPropertiesThrombus Aging +---------+---------------+---------+-----------+----------+--------------+ CFV      Full           Yes      Yes                                 +---------+---------------+---------+-----------+----------+--------------+ SFJ      Full                                                        +---------+---------------+---------+-----------+----------+--------------+ FV Prox  Full                                                        +---------+---------------+---------+-----------+----------+--------------+ FV Mid   Full                                                         +---------+---------------+---------+-----------+----------+--------------+ FV DistalFull                                                        +---------+---------------+---------+-----------+----------+--------------+ PFV      Full                                                        +---------+---------------+---------+-----------+----------+--------------+ POP      Full           Yes      Yes                                 +---------+---------------+---------+-----------+----------+--------------+ PTV      Full                                                        +---------+---------------+---------+-----------+----------+--------------+  PERO     Full                                                        +---------+---------------+---------+-----------+----------+--------------+     Summary: RIGHT: - Findings consistent with age indeterminate deep vein thrombosis involving the right common femoral vein, SF junction, right femoral vein, right proximal profunda vein, right popliteal vein, and right posterior tibial veins. - No cystic structure found in the popliteal fossa.  LEFT: - There is no evidence of deep vein thrombosis in the lower extremity.  - No cystic structure found in the popliteal fossa.  *See table(s) above for measurements and observations. Electronically signed by Lemar Livings MD on 06/16/2020 at 4:58:05 PM.    Final    CT CHEST ABDOMEN PELVIS WO CONTRAST  Result Date: 06/15/2020 CLINICAL DATA:  New onset respiratory failure and abdominal pain, initial encounter EXAM: CT CHEST, ABDOMEN AND PELVIS WITHOUT CONTRAST TECHNIQUE: Multidetector CT imaging of the chest, abdomen and pelvis was performed following the standard protocol without IV contrast. COMPARISON:  Chest x-ray from earlier in the same day, CT from 02/11/2018 FINDINGS: CT CHEST FINDINGS Cardiovascular: Limited due to lack of IV contrast. Atherosclerotic  calcifications of the thoracic aorta are noted without aneurysmal dilatation. No cardiac enlargement is seen. No significant coronary calcifications are noted. Mediastinum/Nodes: Thoracic inlet is within normal limits. The esophagus as visualized is unremarkable. No hilar or mediastinal adenopathy is noted. Lungs/Pleura: Lungs are well aerated bilaterally. Patchy airspace opacities are noted in the lower lobes bilaterally consistent with early infiltrate. Associated small right-sided pleural effusion is noted as well. No sizable parenchymal nodule is seen. No pneumothorax is noted. Musculoskeletal: Degenerative changes of the thoracic spine are seen. No acute bony abnormality is noted. CT ABDOMEN PELVIS FINDINGS Hepatobiliary: Liver is within normal limits. Gallbladder demonstrates vicarious excretion of contrast material or multiple tiny stones. No complicating factors are seen. Pancreas: Unremarkable. No pancreatic ductal dilatation or surrounding inflammatory changes. Spleen: Large rim calcification is noted centrally within the spleen stable in appearance from the prior exam and dating back to 2015. Adrenals/Urinary Tract: Adrenal glands are within normal limits. Kidneys demonstrate no obstructive change. Small exophytic cyst is noted from the right kidney stable in appearance from the prior exam. No renal calculi are seen. The bladder is decompressed. Stomach/Bowel: Increased fecal material is noted within the rectal vault consistent with the degree of impaction. The more proximal colon however is decompressed. The appendix is not well visualized although no inflammatory changes to suggest appendicitis are seen. Stomach is well distended with fluid and ingested food stuffs. The small bowel is dilated starting at the duodenum. Fluid-filled dilated loops of jejunum and ileum are seen extending to the level of the ascending colon. No discrete transitional zone is noted. Correlation with the physical exam is  recommended as to the bowel sounds as this could represent a high-grade small bowel obstruction or small-bowel ileus. Vascular/Lymphatic: Aortic atherosclerosis. No enlarged abdominal or pelvic lymph nodes. Reproductive: Uterus and bilateral adnexa are unremarkable. Other: No abdominal wall hernia or abnormality. No abdominopelvic ascites. Musculoskeletal: Degenerative changes of the lumbar spine are noted. No compression deformities are seen. IMPRESSION: CT of the chest: Patchy airspace opacities in the bases bilaterally consistent with early multifocal pneumonia. Small right-sided effusion is noted. CT of the abdomen and  pelvis: Multiple dilated loops of small bowel extending from the duodenum to the level of the terminal ileum and ascending colon. No discrete lesion is seen. Correlation with the physical exam is recommended as this could represent a high-grade distal obstruction or diffuse small bowel ileus. Vicarious excretion of contrast versus is multiple tiny stones in the gallbladder. No complicating factors are noted. Chronic changes in the spleen and kidneys stable from prior study. Electronically Signed   By: Alcide Clever M.D.   On: 06/15/2020 19:39     Assessment and Plan:   1. 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 Paroxysmal atrial fib with RVR - known history of atrial fib - acute exacerbation is likely secondary to multiple medical issues, as is her hypotension (do not feel the atrial fib itself is causing hypotension as she was tachycardic in sinus tach initially and BP was preserved) - trial of amiodarone drip ongoing - avoid BB/CCB due to hypotension - previously refused anticoagulation so not felt to be a candidate just from afib standpoint, but will defer rx of DVT to primary team (?microhemorrhages on MRI)  3. Chronic diastolic CHF - follow with IV fluid resuscitation  4. Acute kidney injury - repeat labs ordered  5. CAD - conservative management is appropriate - hsTroponin ordered for prognostic value   Risk Assessment/Risk Scores:        New York Heart Association (NYHA) Functional Class Unable to assess due to lethargy  CHA2DS2-VASc Score = 6  This indicates a 9.7% annual risk of stroke. The patient's score is based upon: CHF History: Yes HTN History: Yes Diabetes History: No Stroke History: No Vascular Disease History: Yes Age Score: 2 Gender Score: 1       For questions or updates, please contact [facility] Please consult [email or web address] for contact info under    Signed, Laurann Montana, PA-C  06/16/2020 6:59 PM   Personally seen and examined. Agree with APP above with the following comments: Briefly 80 yo F with Parkinsons and Dementia; non obstructive CAD, HFpEF with new AF in the setting of profound hypotension and SBO Patient at baseline could make ums and grunts per day nursing.  Presently responsive to pain only. Exam notable for significant lethargy, irregular tachycardia, Pressures 80s-> 90s. 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  Now patients has AF RVR with BIFB Rates improved on telemetry to 105-115 on exam. -  Discussed case IM, Nursing, and PCCM:  Overall poor prognosis - hypotension likely not primarily driven by amiodarone; it appears to be fluid responsive -agree with aggressive IVF - can continue amiodarone (BP has stabilize); with this there is a risk of conversion related stroke - in the past patient has not wanted [facility] and would be a suboptimal AC candidate - would not tolerate AV  nodal agents. - in discussion with primary MD, GOC discussions are on going.  CRITICAL CARE Performed by: Giavanna Kang A Taejon Irani  Total critical care time: 45 minutes. Critical care time was exclusive of separately billable procedures and treating other patients. Critical care was necessary to treat or prevent imminent or life-threatening deterioration. Critical care was time spent personally by me on the following activities: development of treatment plan with patient and/or surrogate as well as nursing, discussions with consultants, evaluation of patient's response to treatment, examination of patient, obtaining history from patient or surrogate, ordering and performing treatments and interventions, ordering and review of laboratory studies, ordering and review of radiographic studies, pulse oximetry and re-evaluation of patient's condition.    Signed, Riley Lam, MD [place]  [facility]  06/16/2020 7:38 PM

## 2020-06-16 NOTE — ED Provider Notes (Signed)
I resumed care of this patient after discovering this morning that she had not been admitted to the hospital.  The only further documentation I find is a consult note by surgery which recommends NG tube placement which has not been done so far.  I discussed with the bedside nurse who states that she does not know many details but thinks that the patient had a goals of care discussion and was going to go to palliative care.  I called the daughter and on the fourth attempt I was able to establish contact with the daughter.  We reviewed the case today and the daughter states that she would like to pursue NG tube placement and further management of the patient's SBO.  I also received information of results that the patient has a DVT; this order had not been placed at the end of my shift.  I confirmed the plan for management with surgery and the daughter and NG tube was placed and put to suction.  Surgery agreed to order further tests as they desire.  Started maintenance fluids.  Discussed with the hospitalist; I had not started anticoagulation yet due to NG tube placement but had discussed this with the hospitalist who agreed to admit the patient.   Jacklynn Bue, MD 06/16/20 [id number]    Pollyann Savoy, MD 06/16/20 [phone]

## 2020-06-16 NOTE — ED Notes (Signed)
Critical care providers at bedside to evaluate pt. Pt is fluid responsive at this time. Plan of care is to continue with admission to progressive care and transfer to critical care if there is a decline in pt status.

## 2020-06-16 NOTE — H&P (Addendum)
History and Physical    [name] AJO:[id number] DOB: 1941-01-28 DOA: 06/15/2020  Referring MD/NP/PA: Jacklynn Bue PCP: Nadara Eaton, MD  Patient coming from: Nursing facility via EMS  Chief Complaint: Altered mental status  I have personally briefly reviewed patient's old medical records in [facility]   HPI: [name] is a 80 y.o. female with medical history significant of hypertension, paroxysmal atrial fibrillation, diastolic CHF last EF 55-60%, CVA with residual right-sided weakness, and dementia who presented after being found to be acutely altered from the nursing facility.  History is limited as patient is acutely altered and is obtained from patient's and family and review of records.  At baseline patient is sometimes able to recognize family members, but usually is able to answer yes and no questions.  Apparently she had thrown up 2 days ago and had been evaluated by speech therapy and instructed to be n.p.o. because she would not respond to food being put in her mouth.  Family notes that the patient had been more lethargic and not responding like norma in recent days.  They also confirm that patient is to remain in DNR, but want to continue with treatment for now.  She had previously had no prior history of blood clots and to their knowledge had not had any issues with bleeding in the past.  ED Course: Upon admission into the emergency department patient was noted to be afebrile, pulse 71-1 26, respirations 13-27, blood pressure is low as 87/67 with improvement with IV fluids to 136/92, and O2 saturations as low as 85% with improvement currently on 4 L of nasal cannula oxygen.  Labs from 5/12 significant for hemoglobin 16.2, BUN 55, creatinine 2.15, albumin 3.4, total bilirubin 1.8, and D-dimer 12.4.  While in the ED patient had an episode of vomiting and initial chest x-ray had gave concern for possibility of bowel obstruction.  CT angiogram of the abdomen and  pelvis was obtained revealing multiple distended loops of bowel concerning for possible ileus or obstruction and bilateral bibasilar opacity concerning for multifocal pneumonia.   She was evaluated by general surgery who recommended NG tube placement and for her to be admitted by medicine service.  Somehow patient was not formally admitted at that time and spent the night in the ED.   Doppler ultrasound of the lower extremities was performed and revealed an age-indeterminate right leg DVT.  An NG tube was able to be placed today and she was given empiric antibiotics of Unasyn.  TRH called to admit.  Review of Systems  Unable to perform ROS: Mental status change    Past Medical History:  Diagnosis Date  . Anxiety disorder   . Atrial fibrillation (HCC)    a. new 04/2011;  b. Pradaxa  . Chronic diastolic heart failure (HCC)    Echocardiogram 04/14/11: Moderate LVH, EF 60-65%, mild LAE.  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 Gastritis   . H/O: hysterectomy   . Heart murmur    from age 18 associated with some hemoptysis  . Hypercholesteremia   . Hypertension   . Impaired glucose tolerance   . Obesity   . Osteoarthritis   . Parkinson's disease (HCC)    patient denies having parkinsons  . Stroke [facility]) 2007   Residual R sided weakness, uses a wheelchair, walker at home    Past Surgical History:  Procedure Laterality Date  .  ABDOMINAL HYSTERECTOMY    . CARPAL TUNNEL RELEASE     right  . 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 LEFT HEART CATHETERIZATION WITH CORONARY ANGIOGRAM N/A 05/03/2011   Procedure: LEFT HEART CATHETERIZATION WITH CORONARY ANGIOGRAM;  Surgeon: Herby Abraham, MD;  Location: [facility] CATH LAB;  Service: Cardiovascular;  Laterality: N/A;  . REPLACEMENT TOTAL KNEE     right  . TONSILLECTOMY       reports that she has never smoked. She has never used smokeless  tobacco. She reports that she does not drink alcohol and does not use drugs.  Allergies  Allergen Reactions  . Sulfonamide Derivatives Other (See Comments)    Unknown. Pt doesn't remember.    Family History  Problem Relation Age of Onset  . Cancer Mother 79       died  . Heart attack Father 33       died    Prior to Admission medications   Medication Sig Start Date End Date Taking? Authorizing Provider  amiodarone (PACERONE) 200 MG tablet Take 1 tablet (200 mg total) by mouth daily. 02/13/18  Yes Albertine Grates, MD  aspirin EC 81 MG tablet Take 1 tablet (81 mg total) by mouth daily. 09/04/13  Yes Ghimire, Werner Lean, MD  CALMOSEPTINE 0.44-20.6 % OINT Apply 1 application topically in the morning and at bedtime. To buttocks for protection 04/19/20  Yes [provider]  cefTRIAXone 1 g in dextrose 5 % 50 mL Inject 1 g into the vein once.   Yes [provider]  lactulose (CHRONULAC) 10 GM/15ML solution Take 15 mLs by mouth in the morning and at bedtime. 06/07/20  Yes [provider]  lactulose (CHRONULAC) 10 GM/15ML solution Take by mouth See admin instructions. 1 solution  by mouth one time only for constipation.   Yes [provider]  levothyroxine (SYNTHROID) 50 MCG tablet Take 50 mcg by mouth daily. 04/29/20  Yes [provider]  LINZESS 145 MCG CAPS capsule Take 145 mcg by mouth daily. 06/08/20  Yes [provider]  metoprolol tartrate (LOPRESSOR) 25 MG tablet Take 0.5 tablets (12.5 mg total) by mouth 2 (two) times daily. Patient taking differently: Take 25 mg by mouth 2 (two) times daily. 02/13/18 03/15/18 Yes Albertine Grates, MD  Multiple Vitamin (MULTIVITAMIN WITH MINERALS) TABS tablet Take 1 tablet by mouth daily. 02/13/18  Yes Albertine Grates, MD  [facility] powder Apply 1 application topically See admin instructions. Apply to right groin bid x 10 days 05/31/20  Yes [provider]  pantoprazole (PROTONIX) 20 MG tablet Take 20 mg by mouth daily. 05/14/20   Yes [provider]  senna-docusate (SENOKOT-S) 8.6-50 MG tablet Take 1 tablet by mouth 2 (two) times daily. Patient taking differently: Take 2 tablets by mouth 2 (two) times daily. 02/13/18  Yes Albertine Grates, MD  Sodium Chloride Flush (NORMAL SALINE FLUSH IV) Inject 1 L into the vein See admin instructions. 1 liter bolus as a single dose.   Yes [provider]  Vitamin D, Ergocalciferol, (DRISDOL) 1.25 MG (50000 UT) CAPS capsule Take 50,000 Units by mouth every 30 (thirty) days. 15th   Yes [provider]  atorvastatin (LIPITOR) 40 MG tablet Take 1 tablet (40 mg total) by mouth daily at 6 PM. Patient not taking: Reported on 06/16/2020 02/13/18   Albertine Grates, MD  bisacodyl (DULCOLAX) 10 MG suppository Place 1 suppository (10 mg total) rectally every Monday, Wednesday, and  Friday. Patient not taking: No sig reported 02/13/18   Albertine Grates, MD  feeding supplement, ENSURE ENLIVE, (ENSURE ENLIVE) LIQD Take 237 mLs by mouth 2 (two) times daily between meals. Patient not taking: Reported on 06/16/2020 02/13/18   Albertine Grates, MD  ferrous sulfate 325 (65 FE) MG tablet Take 1 tablet (325 mg total) by mouth every Monday, Wednesday, and Friday for 24 days. Patient not taking: Reported on 06/16/2020 02/13/18 03/09/18  Albertine Grates, MD  folic acid (FOLVITE) 1 MG tablet Take 1 tablet (1 mg total) by mouth daily. Patient not taking: No sig reported 02/13/18   Albertine Grates, MD  Hydrocortisone (GERHARDT'S BUTT CREAM) CREA Apply 1 application topically daily as needed for irritation. Patient not taking: Reported on 06/16/2020    [provider]  magnesium oxide (MAG-OX) 400 MG tablet Take 1 tablet (400 mg total) by mouth daily. Patient not taking: Reported on 06/16/2020 02/13/18   Albertine Grates, MD  pantoprazole (PROTONIX) 40 MG tablet Take 1 tablet (40 mg total) by mouth daily. Patient not taking: No sig reported 02/13/18   Albertine Grates, MD  phosphorus (K PHOS NEUTRAL) [phone] MG tablet Take 1 tablet (250 mg total)  by mouth every Monday, Wednesday, and Friday. Patient not taking: No sig reported 02/13/18   Albertine Grates, MD  polyethylene glycol [facility]) packet Take 17 g by mouth daily. Patient not taking: Reported on 06/16/2020 09/02/13   Horton, Mayer Masker, MD  potassium chloride SA (K-DUR,KLOR-CON) 20 MEQ tablet Take 2 tablets (40 mEq total) by mouth daily. Patient not taking: No sig reported 02/13/18   Albertine Grates, MD    Physical Exam:  Constitutional: Elderly female who appears chronically ill and will not currently answer questions Vitals:   06/16/20 1015 06/16/20 1030 06/16/20 1045 06/16/20 1100  BP: 98/68 106/70 (!) 123/58 103/72  Pulse: 85 85 90 90  Resp: Temp:      TempSrc:      SpO2: 97% 98% 91% 99%  Weight:      Height:       Eyes: Left eye enuleation.  Right-sided gaze preference. ENMT: Mucous membranes are dry. Posterior pharynx clear of any exudate or lesions. NGT in place to suction with green and brown gastric secretions present in canister. Neck: normal, supple, no masses, no thyromegaly Respiratory: Decreased overall aeration, but no significant wheezes rhonchi appreciated.  Patient currently on 4 L nasal cannula oxygen with O2 saturations maintained. Cardiovascular: Irregular and tachycardic.  Mild swelling noted of the right lower extremity.. 2+ pedal pulses. No carotid bruits.  Abdomen: Abdomen is mildly distended with decreased overall bowel sounds. Musculoskeletal: no clubbing / cyanosis. No joint deformity upper and lower extremities. Good ROM, no contractures. Normal muscle tone.  Skin: no rashes, lesions, ulcers. No induration Neurologic: CN 2-12 grossly intact. Sensation intact, DTR normal.  Right-sided weakness with strength 4+/5 on the left upper and lower extremity. Psychiatric: . Alert and oriented x self. Normal mood.     Labs on Admission: I have personally reviewed following labs and imaging studies  CBC: Recent Labs  Lab 06/15/20 1410  WBC 8.5   NEUTROABS 6.2  HGB 16.2*  HCT 52.0*  MCV 84.8  PLT 253   Basic Metabolic Panel: Recent Labs  Lab 06/15/20 1656  NA 143  K 4.2  CL 105  CO2 26  GLUCOSE 132*  BUN 55*  CREATININE 2.15*  CALCIUM 8.9   GFR: Estimated Creatinine Clearance: 19.5 mL/min (A) (by C-G  formula based on SCr of 2.15 mg/dL (H)). Liver Function Tests: Recent Labs  Lab 06/15/20 1656  AST 19  ALT 15  ALKPHOS 81  BILITOT 1.8*  PROT 6.4*  ALBUMIN 3.4*   No results for input(s): LIPASE, AMYLASE in the last 168 hours. Recent Labs  Lab 06/15/20 1656  AMMONIA 35   Coagulation Profile: No results for input(s): INR, PROTIME in the last 168 hours. Cardiac Enzymes: No results for input(s): CKTOTAL, CKMB, CKMBINDEX, TROPONINI in the last 168 hours. BNP (last 3 results) No results for input(s): PROBNP in the last 8760 hours. HbA1C: No results for input(s): HGBA1C in the last 72 hours. CBG: Recent Labs  Lab 06/15/20 1920  GLUCAP 150*   Lipid Profile: No results for input(s): CHOL, HDL, LDLCALC, TRIG, CHOLHDL, LDLDIRECT in the last 72 hours. Thyroid Function Tests: No results for input(s): TSH, T4TOTAL, FREET4, T3FREE, THYROIDAB in the last 72 hours. Anemia Panel: No results for input(s): VITAMINB12, FOLATE, FERRITIN, TIBC, IRON, RETICCTPCT in the last 72 hours. Urine analysis:    Component Value Date/Time   COLORURINE AMBER (A) 06/15/2020 1927   APPEARANCEUR CLOUDY (A) 06/15/2020 1927   LABSPEC 1.027 06/15/2020 1927   PHURINE 5.0 06/15/2020 1927   GLUCOSEU NEGATIVE 06/15/2020 1927   HGBUR NEGATIVE 06/15/2020 1927   BILIRUBINUR NEGATIVE 06/15/2020 1927   KETONESUR 5 (A) 06/15/2020 1927   PROTEINUR 30 (A) 06/15/2020 1927   UROBILINOGEN 1.0 09/02/2013 1132   NITRITE NEGATIVE 06/15/2020 1927   LEUKOCYTESUR NEGATIVE 06/15/2020 1927   Sepsis Labs: Recent Results (from the past 240 hour(s))  SARS CORONAVIRUS 2 (TAT 6-24 HRS) Nasopharyngeal Urine, Clean Catch     Status: None   Collection  Time: 06/15/20  7:24 PM   Specimen: Urine, Clean Catch; Nasopharyngeal  Result Value Ref Range Status   SARS Coronavirus 2 NEGATIVE NEGATIVE Final    Comment: (NOTE) SARS-CoV-2 target nucleic acids are NOT DETECTED.  The SARS-CoV-2 RNA is generally detectable in upper and lower respiratory specimens during the acute phase of infection. Negative results do not preclude SARS-CoV-2 infection, do not rule out co-infections with other pathogens, and should not be used as the sole basis for treatment or other patient management decisions. Negative results must be combined with clinical observations, patient history, and epidemiological information. The expected result is Negative.  Fact Sheet for Patients: HairSlick.no  Fact Sheet for Healthcare Providers: quierodirigir.com  This test is not yet approved or cleared by the [place] FDA and  has been authorized for detection and/or diagnosis of SARS-CoV-2 by FDA under an Emergency Use Authorization (EUA). This EUA will remain  in effect (meaning this test can be used) for the duration of the COVID-19 declaration under Se ction 564(b)(1) of the Act, 21 U.S.C. section 360bbb-3(b)(1), unless the authorization is terminated or revoked sooner.  Performed at [facility], [address] [place], [place] [address]      Radiological Exams on Admission: DG Chest 1 View  Result Date: 06/15/2020 CLINICAL DATA:  Altered mental status. EXAM: CHEST  1 VIEW COMPARISON:  02/03/2018 FINDINGS: Normal sized heart. Gas distended colon beneath both hemidiaphragms. Poor inspiration. Minimal bibasilar atelectasis. Thoracic spine degenerative changes. IMPRESSION: 1. Gas distended colon beneath both hemidiaphragms. This could be an indication of colonic ileus or obstruction. 2. Poor inspiration with minimal bibasilar atelectasis. Electronically Signed   By: Beckie Salts M.D.   On: 06/15/2020 15:13    CT Head Wo Contrast  Result Date: 06/15/2020 CLINICAL DATA:  Altered  mental status. EXAM: CT HEAD WITHOUT CONTRAST TECHNIQUE: Contiguous axial images were obtained from the base of the skull through the vertex without intravenous contrast. COMPARISON:  Head CT 09/03/2013 FINDINGS: Brain: No hemorrhage. Progressive atrophy from prior exam. Advanced periventricular white matter hypodensity, left greater than right, stable from prior exam and likely sequela of chronic small vessel ischemia. No evidence of acute infarct. No midline shift or mass effect. No subdural or extra-axial collection. Enlarged partially empty sella. Vascular: Atherosclerosis of skullbase vasculature without hyperdense vessel or abnormal calcification. Skull: No fracture or focal lesion. Sinuses/Orbits: Dystrophic left globe.  No acute findings. Other: None. IMPRESSION: 1. No acute intracranial abnormality. 2. Progressive atrophy from 2015. Advanced chronic small vessel ischemia. Electronically Signed   By: Narda Rutherford M.D.   On: 06/15/2020 16:07   DG Abd Portable 1V-Small Bowel Protocol-Position Verification  Result Date: 06/16/2020 CLINICAL DATA:  Status post nasogastric tube placement. EXAM: PORTABLE ABDOMEN - 1 VIEW COMPARISON:  CT abdomen and pelvis dated 06/15/2020 FINDINGS: Enteric tube has been placed and appears adequately positioned in the stomach. Gas-filled dilated small bowel loops are again appreciated throughout the abdomen, better demonstrated on yesterday's CT, compatible with obstruction versus ileus. IMPRESSION: 1. Enteric tube appears adequately positioned in the stomach. 2. Continued evidence of small bowel obstruction versus ileus. Electronically Signed   By: Bary Richard M.D.   On: 06/16/2020 11:08   VAS [place] LOWER EXTREMITY VENOUS (DVT) (ONLY [facility] & [facility])  Result Date: 06/16/2020  Lower Venous DVT Study Patient Name:  [name]  Date of Exam:   06/16/2020 Medical Rec #: [id number]         Accession #:     [id number] Date of Birth: May 08, 1940          Patient Gender: F Patient Age:   80Y Exam Location:  [facility] Procedure:      VAS [place] LOWER EXTREMITY VENOUS (DVT) Referring Phys: [id number] ANKIT NANAVATI --------------------------------------------------------------------------------  Indications: Edema.  Comparison Study: no prior Performing Technologist: Blanch Media RVS  Examination Guidelines: A complete evaluation includes B-mode imaging, spectral Doppler, color Doppler, and power Doppler as needed of all accessible portions of each vessel. Bilateral testing is considered an integral part of a complete examination. Limited examinations for reoccurring indications may be performed as noted. The reflux portion of the exam is performed with the patient in reverse Trendelenburg.  +---------+---------------+---------+-----------+----------+-------------------+ RIGHT    CompressibilityPhasicitySpontaneityPropertiesThrombus Aging      +---------+---------------+---------+-----------+----------+-------------------+ CFV      None           Yes      Yes                  Age Indeterminate   +---------+---------------+---------+-----------+----------+-------------------+ SFJ      None                                         Age Indeterminate   +---------+---------------+---------+-----------+----------+-------------------+ FV Prox  None                                         Age Indeterminate   +---------+---------------+---------+-----------+----------+-------------------+ FV Mid   Partial  Age Indeterminate   +---------+---------------+---------+-----------+----------+-------------------+ FV DistalPartial                                      Age Indeterminate   +---------+---------------+---------+-----------+----------+-------------------+ PFV      Partial                                      Age Indeterminate    +---------+---------------+---------+-----------+----------+-------------------+ POP      Partial        Yes      Yes                  Age Indeterminate   +---------+---------------+---------+-----------+----------+-------------------+ PTV      Partial                                      Age Indeterminate   +---------+---------------+---------+-----------+----------+-------------------+ PERO                                                  Not well visualized +---------+---------------+---------+-----------+----------+-------------------+   +---------+---------------+---------+-----------+----------+--------------+ LEFT     CompressibilityPhasicitySpontaneityPropertiesThrombus Aging +---------+---------------+---------+-----------+----------+--------------+ CFV      Full           Yes      Yes                                 +---------+---------------+---------+-----------+----------+--------------+ SFJ      Full                                                        +---------+---------------+---------+-----------+----------+--------------+ FV Prox  Full                                                        +---------+---------------+---------+-----------+----------+--------------+ FV Mid   Full                                                        +---------+---------------+---------+-----------+----------+--------------+ FV DistalFull                                                        +---------+---------------+---------+-----------+----------+--------------+ PFV      Full                                                        +---------+---------------+---------+-----------+----------+--------------+  POP      Full           Yes      Yes                                 +---------+---------------+---------+-----------+----------+--------------+ PTV      Full                                                         +---------+---------------+---------+-----------+----------+--------------+ PERO     Full                                                        +---------+---------------+---------+-----------+----------+--------------+    Summary: RIGHT: - Findings consistent with age indeterminate deep vein thrombosis involving the right common femoral vein, SF junction, right femoral vein, right proximal profunda vein, right popliteal vein, and right posterior tibial veins. - No cystic structure found in the popliteal fossa.  LEFT: - There is no evidence of deep vein thrombosis in the lower extremity.  - No cystic structure found in the popliteal fossa.  *See table(s) above for measurements and observations.    Preliminary    CT CHEST ABDOMEN PELVIS WO CONTRAST  Result Date: 06/15/2020 CLINICAL DATA:  New onset respiratory failure and abdominal pain, initial encounter EXAM: CT CHEST, ABDOMEN AND PELVIS WITHOUT CONTRAST TECHNIQUE: Multidetector CT imaging of the chest, abdomen and pelvis was performed following the standard protocol without IV contrast. COMPARISON:  Chest x-ray from earlier in the same day, CT from 02/11/2018 FINDINGS: CT CHEST FINDINGS Cardiovascular: Limited due to lack of IV contrast. Atherosclerotic calcifications of the thoracic aorta are noted without aneurysmal dilatation. No cardiac enlargement is seen. No significant coronary calcifications are noted. Mediastinum/Nodes: Thoracic inlet is within normal limits. The esophagus as visualized is unremarkable. No hilar or mediastinal adenopathy is noted. Lungs/Pleura: Lungs are well aerated bilaterally. Patchy airspace opacities are noted in the lower lobes bilaterally consistent with early infiltrate. Associated small right-sided pleural effusion is noted as well. No sizable parenchymal nodule is seen. No pneumothorax is noted. Musculoskeletal: Degenerative changes of the thoracic spine are seen. No acute bony abnormality is noted. CT ABDOMEN PELVIS  FINDINGS Hepatobiliary: Liver is within normal limits. Gallbladder demonstrates vicarious excretion of contrast material or multiple tiny stones. No complicating factors are seen. Pancreas: Unremarkable. No pancreatic ductal dilatation or surrounding inflammatory changes. Spleen: Large rim calcification is noted centrally within the spleen stable in appearance from the prior exam and dating back to 2015. Adrenals/Urinary Tract: Adrenal glands are within normal limits. Kidneys demonstrate no obstructive change. Small exophytic cyst is noted from the right kidney stable in appearance from the prior exam. No renal calculi are seen. The bladder is decompressed. Stomach/Bowel: Increased fecal material is noted within the rectal vault consistent with the degree of impaction. The more proximal colon however is decompressed. The appendix is not well visualized although no inflammatory changes to suggest appendicitis are seen. Stomach is well distended with fluid and ingested food stuffs. The small bowel is dilated starting at the duodenum. Fluid-filled dilated loops of jejunum and ileum  are seen extending to the level of the ascending colon. No discrete transitional zone is noted. Correlation with the physical exam is recommended as to the bowel sounds as this could represent a high-grade small bowel obstruction or small-bowel ileus. Vascular/Lymphatic: Aortic atherosclerosis. No enlarged abdominal or pelvic lymph nodes. Reproductive: Uterus and bilateral adnexa are unremarkable. Other: No abdominal wall hernia or abnormality. No abdominopelvic ascites. Musculoskeletal: Degenerative changes of the lumbar spine are noted. No compression deformities are seen. IMPRESSION: CT of the chest: Patchy airspace opacities in the bases bilaterally consistent with early multifocal pneumonia. Small right-sided effusion is noted. CT of the abdomen and pelvis: Multiple dilated loops of small bowel extending from the duodenum to the level  of the terminal ileum and ascending colon. No discrete lesion is seen. Correlation with the physical exam is recommended as this could represent a high-grade distal obstruction or diffuse small bowel ileus. Vicarious excretion of contrast versus is multiple tiny stones in the gallbladder. No complicating factors are noted. Chronic changes in the spleen and kidneys stable from prior study. Electronically Signed   By: Alcide CleverMark  Lukens M.D.   On: 06/15/2020 19:39    EKG: Independently reviewed.  Wide-complex tachycardia 141 bpm for which this may be atrial fibrillation  Assessment/Plan SBO: Acute.  Patient presented after she reportedly was not eating or drinking and had a couple episodes of vomiting.  Imaging studies significant for all loops of bowel concerning for small bowel obstruction.  General surgery have been formally consulted and recommended NG tube placement and conservative measures.  They note that the patient is for surgical candidate if things were to progress. -Admit to medical telemetry bed  -Continue NG tube to suction -Normal saline IV fluids at 100 mL/h -Serial abdominal studies -Appreciate general surgery consultative services, we will follow-up for any further recommendation  Acute respiratory failure with hypoxia secondary to aspiration pneumonia: Patient was noted to have vomited at least once in the emergency department prior to the CT scan being obtained.  Given history of stroke with residual weakness suspect patient likely aspirated at that time.  However, given the fact that the patient has a DVT question possibility that a piece of the clot did not go to her lungs. -Continuous pulse oximetry with nasal cannula oxygen -Continue empiric antibiotics of Unasyn -Check VQ scan and echocardiogram  Right leg DVT: Acute.  Patient was noted to have significantly elevated D-dimer and subsequent Doppler ultrasound of the lower extremity revealed a right lower extremity DVT. -Bedrest  due to DVT -Lovenox per pharmacy  Paroxysmal atrial fibrillation: After patient had gone to MRI and come back and was noted to have low blood pressures with heart rates into the 140s to 150s.  EKG revealed a wide QRS tachycardia for which it was reviewed with cardiology and suspected this is atrial fibrillation.  She is on amiodarone and metoprolol at home.  However blood pressures will not tolerate beta-blocker at this time. -Amiodarone drip -Appreciate cardiology consultative services, we will follow-up for any further recommendation  Acute kidney injury: Patient baseline creatinine previously noted to be within normal limits around 1, but she presents with creatinine elevated up to 2.15 with BUN 55.  Given the elevated BUN to creatinine ratio suggests prerenal cause of symptoms and due to the fact that she had not been eating and drinking her recent. -IV fluids as seen above -Recheck kidney function in a.m.  Diastolic congestive heart failure: Last echocardiogram revealed EF of 55 to 60%.  At  this time patient does not appear to be grossly fluid overloaded and would suspect that she is dry. -Continue to monitor intake and output -Daily weights  Acute metabolic encephalopathy dementia: At baseline patient has dementia, but is able to normally answer yes/no questions.  However, he recently patient had been more lethargic and nonverbal and there was question if the patient possibly had a stroke.  MRI of the brain was performed and negative except for as noted above.  Suspect dehydration and/or aspiration pneumonia contributing to cephalopathy -Neurochecks  History of CVA with residual deficit: At baseline patient has right-sided deficits from prior stroke.  DNR: Present on admission: Palliative care was formally consulted to help family with overall goals of care.  At this time they want to proceed with treatment, but if patient's status were to further decline over would want to rediscuss  treatments at that time.  DVT prophylaxis: Lovenox treatment dose Code Status: DNR Family Communication: Patient's daughter and sister updated Disposition Plan: Likely discharge back to her nursing facility once medically stable Consults called: General Surgery and palliative care Admission status: inpatient, require more than 2 midnight stay due to small bowel obstruction and need of IV antibiotics  Clydie Braun MD [facility]   If 7PM-7AM, please contact night-coverage   06/16/2020, 11:36 AM

## 2020-06-16 NOTE — ED Notes (Signed)
Called 2C charge RN to update on patient status and plan of care. Primary RN in report. Extension given for call back.

## 2020-06-16 NOTE — ED Notes (Signed)
Pt returned from MRI around 1515. Once the pt was placed back on monitor and vital sign equipment, HR showed between 135-155 and SBP between 78-85 for multiple readings. R.Smith, MD made aware. 500 mL NS IVF bolus ordered and administered.

## 2020-06-16 NOTE — ED Notes (Signed)
Pt has become hypotensive again. R.Smith, MD made aware. Additional IVF bolus ordered and administered. Progressive unit RN and charge RN wants to make sure this pt is appropriate for progressive care due to the hypotension. If pt is fluid responsive or becomes comfort care, they will be able to accept the pt.R.Smith, MD made aware.

## 2020-06-16 NOTE — ED Provider Notes (Incomplete)
Dr. Rhunette Croft called Dr. Ian Bushman and they did not call back-although page had been out for 4 hours and multiple calls.

## 2020-06-16 NOTE — Progress Notes (Signed)
ANTICOAGULATION CONSULT NOTE  Pharmacy Consult for Lovenox Indication: DVT  Labs: Recent Labs    06/15/20 1410 06/15/20 1656  HGB 16.2*  --   HCT 52.0*  --   PLT 253  --   CREATININE  --  2.15*    Assessment: 49 yof presenting with SBO, age intederminate RLE DVT. Pharmacy consulted to dose Lovenox. Patient is not on anticoagulation PTA. D-dimer 12.14, CBC wnl, SCr 2.15 (last labs 5/12), CrCl <30. No current active bleed issues reported.  Goal of Therapy:  Anti-Xa level 0.6-1 units/ml 4hrs after LMWH dose given Monitor platelets by anticoagulation protocol: Yes   Plan:  Lovenox 80mg  (~1mg /kg) [place] q24h Monitor CBC at least q72h, SCr, s/sx bleeding F/u Surgery plans, GOC   , PharmD, BCPS Clinical Pharmacist 06/16/2020 12:45 PM

## 2020-06-16 NOTE — ED Notes (Signed)
Critical care providers to see pt to evaluate need for critical care admission.

## 2020-06-16 NOTE — ED Notes (Signed)
Pt transported to NM for VQ scan  

## 2020-06-16 NOTE — ED Notes (Signed)
Patient transported to MRI 

## 2020-06-16 NOTE — Progress Notes (Signed)
ANTICOAGULATION CONSULT NOTE  Pharmacy Consult for Lovenox Indication: DVT  Labs: Recent Labs    06/15/20 1410 06/15/20 1656 06/16/20 1836 06/16/20 2145  HGB 16.2*  --   --  13.4  HCT 52.0*  --   --  42.2  PLT 253  --   --  128*  CREATININE  --  2.15* 0.89  --     Assessment: 27 yof presenting with SBO, age intederminate RLE DVT. Pharmacy consulted to dose Lovenox. Patient is not on anticoagulation PTA. D-dimer 12.14, CBC wnl.  SCr now back to 0.89 (appears the 2.15 may have been inaccurate), est CrCl 45 ml/min.  Goal of Therapy:  Anti-Xa level 0.6-1 units/ml 4hrs after LMWH dose given Monitor platelets by anticoagulation protocol: Yes   Plan:  Change Lovenox to 80mg  (~1mg /kg) [name] q12h Monitor CBC at least q72h, SCr, s/sx bleeding F/u Surgery plans, GOC  , PharmD, BCPS Please see amion for complete clinical pharmacist phone list 06/16/2020 11:08 PM

## 2020-06-16 NOTE — Progress Notes (Signed)
Echo attempted. Patient not in ED room at 4:47 pm

## 2020-06-16 NOTE — Progress Notes (Signed)
Lower extremity venous has been completed.   Preliminary results in CV Proc.   Results given to Cameron,RN. Blanch Media 06/16/2020 8:53 AM

## 2020-06-16 NOTE — Consult Note (Signed)
NAME:  [name], MRN:  [id number], DOB:  03/01/1940, LOS: 0 ADMISSION DATE:  06/15/2020, CONSULTATION DATE:  06/16/2020 REFERRING MD:  Dr. Katrinka Blazing, CHIEF COMPLAINT:  Hypotension    History of Present Illness:  [name] is a 80 y.o. with a PMH significant for prior stroke, parkinson's disease, HTN. HLD, CAD, CHF, A-fib, and obesity who presented to the ED for concern of AMS. She resides at local SNF and was noted to be more confused lately and the night prior to admission she vomited after eating dinner. The following morning patients mentation had worsened and there was concern for acute stroke given right facial drop.   During evaluation in ED patient was seen with acute bowel obstruction vs acute ileus. Therefore, patient was evaluation by general surgery who recommended NG tube placement and conservative management with continue goals of care discussion. Additionally patient was seen with age indeterminate right leg DVT for which weight based Lovenonx was started.  It appears that while in the ED she became tachycardiac with A-fib RVR which resulted in progressive hypotension. At that time an amiodarone drip was started but patient remained hypotensive and a GOC was held with PMT and TRH. The family whishes to continue with aggressive care at this time including pressors support. PCCM consulted for further management   Pertinent  Medical History  Prior stroke, parkinson's disease, HTN. HLD, CAD, CHF, A-fib, and obesity  Significant Hospital Events: Including procedures, antibiotic start and stop dates in addition to other pertinent events   . 5/13 Admitted for SBO, right left DVT, and A-fib RVR  Interim History / Subjective:  As above   Objective   Blood pressure 94/67, pulse (!) 122, temperature 98.9 F (37.2 C), temperature source Axillary, resp. rate 15, height 5\' 2"  (1.575 m), weight 77.6 kg, SpO2 95 %.        Intake/Output Summary (Last 24 hours) at 06/16/2020  1829 Last data filed at 06/16/2020 1802 Gross per 24 hour  Intake 500 ml  Output --  Net 500 ml   Filed Weights   06/15/20 1402  Weight: 77.6 kg    Examination: General: Acute on chronic ill appearing deconditioned elderly female lying in bed, in NAD HEENT: /AT, MM pink/moist, PERRL,  Neuro: Groans to  Pain, nonverbal at baseline, unable to follow simple commands  CV: s1s2 regular rate and rhythm, no murmur, rubs, or gallops,  PULM:  Clear to ascultation bilaterally, no increased work of breath, no added breath soounds GI: soft, bowel sounds hypo-active in all 4 quadrants, non-tender, mildly distended, NGT LIWS  Extremities: warm/dry, no edema  Skin: no rashes or lesions  Labs/imaging that I have personally reviewed    CXR 5/12 > Gas distended colon beneath both hemidiaphragms. This could be an indication of colonic ileus or obstruction.  Head CT 5/12 > negative   ABD X-ray 5/13 >  Continued evidence of small bowel obstruction versus ileus.  MRI brain 5/13 > no acute findings, moderate cerberal atrophy with severe cerbral white matter disease   VQ scan 5/13  No appreciable perfusion defects. There are no findings which are felt to be indicative of pulmonary embolus.  Resolved Hospital Problem list     Assessment & Plan:  Hypotension in the setting of Afib with RVR Patient became progressively hypotensive with progressive tachycardia Paroxysmal atrial fibrillation Hypotensive to SBP 70-80s while in MRI; noted to be in Afib with RVR with rates 130-150s. Pressures improved with amiodarone initiation and IVF resuscitation.  P: - PCCM consult for possible pressor needs - No acute indication for pressors at this time  - Continue IV resuscitation  - Goal MAP > 65 - Continue amiodarone - Continuous telemetry  - Hold home Amiodarone and Lopressor  Acute hypoxia, multifactorial likely secondary to multifocal pneumonia and encephalopathy O2 saturations in 80s on arrival to  ED/at facility. VQ scan without appreciable perfusion defects, no indication of PE.  P: - Wean FiO2 for goal sat > 90% - Continue empiric antibiotic coverage - Encourage pulmonary hygiene   Acute-on-chronic diastolic HF (EF 55-60%) CAD P: - F/u repeat Echo - Continuous telemetry  - Supportive care   Ileus versus SBO Surgery consulted in ED with recommendation for conservative management with NGT. P: - Continue NGT - Management per Surgery/primary team  RLE DVT BLE Duplex 5/13 demonstrating age-indeterminate DVT P: - Continue Lovenox  Acute encephalopathy  Parkinson's disease S/p CVA with residual R-sided deficits Dementia P: Acutely altered at facility today with new inability to swallow, confused per family. History of CVA with unilateral R-sided deficits, Parkinson's disease, dementia. CT Head nonacute with progressive atrophy. MRI Brain with motion degradation, progressive cerebral atrophy, numerous chronic microhemorrhages with "c/f cerebral amyloid angiopathy."  Best practice   Diet:  NPO Pain/Anxiety/Delirium protocol (if indicated): No VAP protocol (if indicated): Not indicated DVT prophylaxis: LMWH GI prophylaxis: N/A Glucose control:  SSI No Central venous access:  N/A Arterial line:  N/A Foley:  N/A Mobility:  OOB  PT consulted: Yes Last date of multidisciplinary goals of care discussion Per primary  Code Status:  DNR Disposition: Progressive   Labs   CBC: Recent Labs  Lab 06/15/20 1410  WBC 8.5  NEUTROABS 6.2  HGB 16.2*  HCT 52.0*  MCV 84.8  PLT 253    Basic Metabolic Panel: Recent Labs  Lab 06/15/20 1656  NA 143  K 4.2  CL 105  CO2 26  GLUCOSE 132*  BUN 55*  CREATININE 2.15*  CALCIUM 8.9   GFR: Estimated Creatinine Clearance: 19.5 mL/min (A) (by C-G formula based on SCr of 2.15 mg/dL (H)). Recent Labs  Lab 06/15/20 1410 06/15/20 1656  WBC 8.5  --   LATICACIDVEN  --  2.4*    Liver Function Tests: Recent Labs  Lab  06/15/20 1656  AST 19  ALT 15  ALKPHOS 81  BILITOT 1.8*  PROT 6.4*  ALBUMIN 3.4*   No results for input(s): LIPASE, AMYLASE in the last 168 hours. Recent Labs  Lab 06/15/20 1656  AMMONIA 35    ABG No results found for: PHART, PCO2ART, PO2ART, HCO3, TCO2, ACIDBASEDEF, O2SAT   Coagulation Profile: No results for input(s): INR, PROTIME in the last 168 hours.  Cardiac Enzymes: No results for input(s): CKTOTAL, CKMB, CKMBINDEX, TROPONINI in the last 168 hours.  HbA1C: No results found for: HGBA1C  CBG: Recent Labs  Lab 06/15/20 1920  GLUCAP 150*    Review of Systems:   Unable to gather   Past Medical History:  She,  has a past medical history of Anxiety disorder, Atrial fibrillation (HCC), Chronic diastolic heart failure (HCC), Coronary artery disease, Gastritis, H/O: hysterectomy, Heart murmur, Hypercholesteremia, Hypertension, Impaired glucose tolerance, Obesity, Osteoarthritis, Parkinson's disease (HCC), and Stroke (HCC) (2007).   Surgical History:   Past Surgical History:  Procedure Laterality Date  . ABDOMINAL HYSTERECTOMY    . CARPAL TUNNEL RELEASE     right  . 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  LEFT HEART CATHETERIZATION WITH CORONARY ANGIOGRAM N/A 05/03/2011   Procedure: LEFT HEART CATHETERIZATION WITH CORONARY ANGIOGRAM;  Surgeon: Herby Abraham, MD;  Location: [facility] CATH LAB;  Service: Cardiovascular;  Laterality: N/A;  . REPLACEMENT TOTAL KNEE     right  . TONSILLECTOMY       Social History:   reports that she has never smoked. She has never used smokeless tobacco. She reports that she does not drink alcohol and does not use drugs.   Family History:  Her family history includes Cancer (age of onset: 80) in her mother; Heart attack (age of onset: 84) in her father.   Allergies Allergies  Allergen Reactions  . Sulfonamide Derivatives Other (See Comments)    Unknown. Pt doesn't remember.     Home Medications  Prior to  Admission medications   Medication Sig Start Date End Date Taking? Authorizing Provider  amiodarone (PACERONE) 200 MG tablet Take 1 tablet (200 mg total) by mouth daily. 02/13/18  Yes Albertine Grates, MD  aspirin EC 81 MG tablet Take 1 tablet (81 mg total) by mouth daily. 09/04/13  Yes Ghimire, Werner Lean, MD  CALMOSEPTINE 0.44-20.6 % OINT Apply 1 application topically in the morning and at bedtime. To buttocks for protection 04/19/20  Yes [provider]  cefTRIAXone 1 g in dextrose 5 % 50 mL Inject 1 g into the vein once.   Yes [provider]  lactulose (CHRONULAC) 10 GM/15ML solution Take 15 mLs by mouth in the morning and at bedtime. 06/07/20  Yes [provider]  lactulose (CHRONULAC) 10 GM/15ML solution Take by mouth See admin instructions. 1 solution  by mouth one time only for constipation.   Yes [provider]  levothyroxine (SYNTHROID) 50 MCG tablet Take 50 mcg by mouth daily. 04/29/20  Yes [provider]  LINZESS 145 MCG CAPS capsule Take 145 mcg by mouth daily. 06/08/20  Yes [provider]  metoprolol tartrate (LOPRESSOR) 25 MG tablet Take 0.5 tablets (12.5 mg total) by mouth 2 (two) times daily. Patient taking differently: Take 25 mg by mouth 2 (two) times daily. 02/13/18 03/15/18 Yes Albertine Grates, MD  Multiple Vitamin (MULTIVITAMIN WITH MINERALS) TABS tablet Take 1 tablet by mouth daily. 02/13/18  Yes Albertine Grates, MD  [facility] powder Apply 1 application topically See admin instructions. Apply to right groin bid x 10 days 05/31/20  Yes [provider]  pantoprazole (PROTONIX) 20 MG tablet Take 20 mg by mouth daily. 05/14/20  Yes [provider]  senna-docusate (SENOKOT-S) 8.6-50 MG tablet Take 1 tablet by mouth 2 (two) times daily. Patient taking differently: Take 2 tablets by mouth 2 (two) times daily. 02/13/18  Yes Albertine Grates, MD  Sodium Chloride Flush (NORMAL SALINE FLUSH IV) Inject 1 L into the vein See admin instructions. 1 liter bolus as a  single dose.   Yes [provider]  Vitamin D, Ergocalciferol, (DRISDOL) 1.25 MG (50000 UT) CAPS capsule Take 50,000 Units by mouth every 30 (thirty) days. 15th   Yes [provider]  atorvastatin (LIPITOR) 40 MG tablet Take 1 tablet (40 mg total) by mouth daily at 6 PM. Patient not taking: Reported on 06/16/2020 02/13/18   Albertine Grates, MD  bisacodyl (DULCOLAX) 10 MG suppository Place 1 suppository (10 mg total) rectally every Monday, Wednesday, and Friday. Patient not taking: No sig reported 02/13/18   Albertine Grates, MD  feeding supplement, ENSURE ENLIVE, (ENSURE ENLIVE) LIQD Take 237 mLs by mouth 2 (two) times daily between meals. Patient  not taking: Reported on 06/16/2020 02/13/18   Albertine GratesXu, Fang, MD  ferrous sulfate 325 (65 FE) MG tablet Take 1 tablet (325 mg total) by mouth every Monday, Wednesday, and Friday for 24 days. Patient not taking: Reported on 06/16/2020 02/13/18 03/09/18  Albertine GratesXu, Fang, MD  folic acid (FOLVITE) 1 MG tablet Take 1 tablet (1 mg total) by mouth daily. Patient not taking: No sig reported 02/13/18   Albertine GratesXu, Fang, MD  Hydrocortisone (GERHARDT'S BUTT CREAM) CREA Apply 1 application topically daily as needed for irritation. Patient not taking: Reported on 06/16/2020    [provider]  magnesium oxide (MAG-OX) 400 MG tablet Take 1 tablet (400 mg total) by mouth daily. Patient not taking: Reported on 06/16/2020 02/13/18   Albertine GratesXu, Fang, MD  pantoprazole (PROTONIX) 40 MG tablet Take 1 tablet (40 mg total) by mouth daily. Patient not taking: No sig reported 02/13/18   Albertine GratesXu, Fang, MD  phosphorus (K PHOS NEUTRAL) 629-528-413155-852-130 MG tablet Take 1 tablet (250 mg total) by mouth every Monday, Wednesday, and Friday. Patient not taking: No sig reported 02/13/18   Albertine GratesXu, Fang, MD  polyethylene glycol [facility](MIRALAX) packet Take 17 g by mouth daily. Patient not taking: Reported on 06/16/2020 09/02/13   Horton, Mayer Maskerourtney F, MD  potassium chloride SA (K-DUR,KLOR-CON) 20 MEQ tablet Take 2 tablets (40 mEq total) by  mouth daily. Patient not taking: No sig reported 02/13/18   Albertine GratesXu, Fang, MD     Signature:    Delfin GantWhitney F Millenia Waldvogel, NP-C [facility] Personal contact information can be found on Amion  06/16/2020, 6:58 PM

## 2020-06-16 NOTE — Progress Notes (Signed)
Subjective: Patient laying in bed with vomitus down the side of her chin.  Doesn't really communicate well with me.  ROS: unable due to AMS  Objective: Vital signs in last 24 hours: Temp:  [98 F (36.7 C)-98.9 F (37.2 C)] 98.9 F (37.2 C) (05/13 0730) Pulse Rate:  [71-125] 90 (05/13 0730) Resp:  [13-27] 19 (05/13 0730) BP: (87-136)/(67-92) 110/74 (05/13 0730) SpO2:  [85 %-100 %] 100 % (05/13 0730) Weight:  [77.6 kg] 77.6 kg (05/12 1402)    Intake/Output from previous day: No intake/output data recorded. Intake/Output this shift: No intake/output data recorded.  PE: Gen: NAD Abd: distended, unable to tell if tender as she doesn't respond when I palpate.  Tympanitic.   Psych: unable, doesn't really talk to me  Lab Results:  Recent Labs    06/15/20 1410  WBC 8.5  HGB 16.2*  HCT 52.0*  PLT 253   BMET Recent Labs    06/15/20 1656  NA 143  K 4.2  CL 105  CO2 26  GLUCOSE 132*  BUN 55*  CREATININE 2.15*  CALCIUM 8.9   PT/INR No results for input(s): LABPROT, INR in the last 72 hours. CMP     Component Value Date/Time   NA 143 06/15/2020 1656   K 4.2 06/15/2020 1656   CL 105 06/15/2020 1656   CO2 26 06/15/2020 1656   GLUCOSE 132 (H) 06/15/2020 1656   BUN 55 (H) 06/15/2020 1656   CREATININE 2.15 (H) 06/15/2020 1656   CALCIUM 8.9 06/15/2020 1656   PROT 6.4 (L) 06/15/2020 1656   ALBUMIN 3.4 (L) 06/15/2020 1656   AST 19 06/15/2020 1656   ALT 15 06/15/2020 1656   ALKPHOS 81 06/15/2020 1656   BILITOT 1.8 (H) 06/15/2020 1656   GFRNONAA 22 (L) 06/15/2020 1656   GFRAA >60 02/13/2018 0701   Lipase     Component Value Date/Time   LIPASE 63 (H) 01/27/2018 1110       Studies/Results: DG Chest 1 View  Result Date: 06/15/2020 CLINICAL DATA:  Altered mental status. EXAM: CHEST  1 VIEW COMPARISON:  02/03/2018 FINDINGS: Normal sized heart. Gas distended colon beneath both hemidiaphragms. Poor inspiration. Minimal bibasilar atelectasis. Thoracic  spine degenerative changes. IMPRESSION: 1. Gas distended colon beneath both hemidiaphragms. This could be an indication of colonic ileus or obstruction. 2. Poor inspiration with minimal bibasilar atelectasis. Electronically Signed   By: Beckie Salts M.D.   On: 06/15/2020 15:13   CT Head Wo Contrast  Result Date: 06/15/2020 CLINICAL DATA:  Altered mental status. EXAM: CT HEAD WITHOUT CONTRAST TECHNIQUE: Contiguous axial images were obtained from the base of the skull through the vertex without intravenous contrast. COMPARISON:  Head CT 09/03/2013 FINDINGS: Brain: No hemorrhage. Progressive atrophy from prior exam. Advanced periventricular white matter hypodensity, left greater than right, stable from prior exam and likely sequela of chronic small vessel ischemia. No evidence of acute infarct. No midline shift or mass effect. No subdural or extra-axial collection. Enlarged partially empty sella. Vascular: Atherosclerosis of skullbase vasculature without hyperdense vessel or abnormal calcification. Skull: No fracture or focal lesion. Sinuses/Orbits: Dystrophic left globe.  No acute findings. Other: None. IMPRESSION: 1. No acute intracranial abnormality. 2. Progressive atrophy from 2015. Advanced chronic small vessel ischemia. Electronically Signed   By: Narda Rutherford M.D.   On: 06/15/2020 16:07   CT CHEST ABDOMEN PELVIS WO CONTRAST  Result Date: 06/15/2020 CLINICAL DATA:  New onset respiratory failure and abdominal pain, initial encounter EXAM: CT  CHEST, ABDOMEN AND PELVIS WITHOUT CONTRAST TECHNIQUE: Multidetector CT imaging of the chest, abdomen and pelvis was performed following the standard protocol without IV contrast. COMPARISON:  Chest x-ray from earlier in the same day, CT from 02/11/2018 FINDINGS: CT CHEST FINDINGS Cardiovascular: Limited due to lack of IV contrast. Atherosclerotic calcifications of the thoracic aorta are noted without aneurysmal dilatation. No cardiac enlargement is seen. No  significant coronary calcifications are noted. Mediastinum/Nodes: Thoracic inlet is within normal limits. The esophagus as visualized is unremarkable. No hilar or mediastinal adenopathy is noted. Lungs/Pleura: Lungs are well aerated bilaterally. Patchy airspace opacities are noted in the lower lobes bilaterally consistent with early infiltrate. Associated small right-sided pleural effusion is noted as well. No sizable parenchymal nodule is seen. No pneumothorax is noted. Musculoskeletal: Degenerative changes of the thoracic spine are seen. No acute bony abnormality is noted. CT ABDOMEN PELVIS FINDINGS Hepatobiliary: Liver is within normal limits. Gallbladder demonstrates vicarious excretion of contrast material or multiple tiny stones. No complicating factors are seen. Pancreas: Unremarkable. No pancreatic ductal dilatation or surrounding inflammatory changes. Spleen: Large rim calcification is noted centrally within the spleen stable in appearance from the prior exam and dating back to 2015. Adrenals/Urinary Tract: Adrenal glands are within normal limits. Kidneys demonstrate no obstructive change. Small exophytic cyst is noted from the right kidney stable in appearance from the prior exam. No renal calculi are seen. The bladder is decompressed. Stomach/Bowel: Increased fecal material is noted within the rectal vault consistent with the degree of impaction. The more proximal colon however is decompressed. The appendix is not well visualized although no inflammatory changes to suggest appendicitis are seen. Stomach is well distended with fluid and ingested food stuffs. The small bowel is dilated starting at the duodenum. Fluid-filled dilated loops of jejunum and ileum are seen extending to the level of the ascending colon. No discrete transitional zone is noted. Correlation with the physical exam is recommended as to the bowel sounds as this could represent a high-grade small bowel obstruction or small-bowel ileus.  Vascular/Lymphatic: Aortic atherosclerosis. No enlarged abdominal or pelvic lymph nodes. Reproductive: Uterus and bilateral adnexa are unremarkable. Other: No abdominal wall hernia or abnormality. No abdominopelvic ascites. Musculoskeletal: Degenerative changes of the lumbar spine are noted. No compression deformities are seen. IMPRESSION: CT of the chest: Patchy airspace opacities in the bases bilaterally consistent with early multifocal pneumonia. Small right-sided effusion is noted. CT of the abdomen and pelvis: Multiple dilated loops of small bowel extending from the duodenum to the level of the terminal ileum and ascending colon. No discrete lesion is seen. Correlation with the physical exam is recommended as this could represent a high-grade distal obstruction or diffuse small bowel ileus. Vicarious excretion of contrast versus is multiple tiny stones in the gallbladder. No complicating factors are noted. Chronic changes in the spleen and kidneys stable from prior study. Electronically Signed   By: Alcide Clever M.D.   On: 06/15/2020 19:39    Anti-infectives: Anti-infectives (From admission, onward)   Start     Dose/Rate Route Frequency Ordered Stop   06/15/20 2100  Ampicillin-Sulbactam (UNASYN) 3 g in sodium chloride 0.9 % 100 mL IVPB        3 g 200 mL/hr over 30 Minutes Intravenous  Once 06/15/20 2009 06/15/20 2204   06/15/20 2100  vancomycin (VANCOREADY) IVPB 1000 mg/200 mL        1,000 mg 200 mL/hr over 60 Minutes Intravenous  Once 06/15/20 2009 06/16/20 0023  Assessment/Plan  MMP  SBO -patient has a history of an ileus and constipation per the notes from the SNF -no NGT has been placed.  There is report the patient may be comfort care and that's why an NGT hasn't been placed, but she is also not admitted currently and unclear that the palliative care team has seen her yet either. -as of now we will hold on further intervention until it is clear if the patient is comfort care or  wants to pursue more aggressive care.  If the later, we would proceed with an NGT and SBO protocol; however, the patient is not in good health and would be a very poor operative candidate.   FEN - NPO VTE - none currently  ID - none    LOS: 0 days    Letha Cape , [facility] 06/16/2020, 8:29 AM Please see Amion for pager number during day hours 7:00am-4:30pm or 7:00am -11:30am on weekends

## 2020-06-17 ENCOUNTER — Inpatient Hospital Stay (HOSPITAL_COMMUNITY): Payer: Medicare Other

## 2020-06-17 DIAGNOSIS — R9431 Abnormal electrocardiogram [ECG] [EKG]: Secondary | ICD-10-CM

## 2020-06-17 LAB — COMPREHENSIVE METABOLIC PANEL
ALT: 14 U/L (ref 0–44)
ALT: 14 U/L (ref 0–44)
AST: 18 U/L (ref 15–41)
AST: 21 U/L (ref 15–41)
Albumin: 2.5 g/dL — ABNORMAL LOW (ref 3.5–5.0)
Albumin: 2.5 g/dL — ABNORMAL LOW (ref 3.5–5.0)
Alkaline Phosphatase: 59 U/L (ref 38–126)
Alkaline Phosphatase: 66 U/L (ref 38–126)
Anion gap: 10 (ref 5–15)
Anion gap: 10 (ref 5–15)
BUN: 49 mg/dL — ABNORMAL HIGH (ref 8–23)
BUN: 35 mg/dL — ABNORMAL HIGH (ref 8–23)
CO2: 21 mmol/L — ABNORMAL LOW (ref 22–32)
CO2: 22 mmol/L (ref 22–32)
Calcium: 7.4 mg/dL — ABNORMAL LOW (ref 8.9–10.3)
Calcium: 7.9 mg/dL — ABNORMAL LOW (ref 8.9–10.3)
Chloride: 113 mmol/L — ABNORMAL HIGH (ref 98–111)
Chloride: 114 mmol/L — ABNORMAL HIGH (ref 98–111)
Creatinine, Ser: 1.78 mg/dL — ABNORMAL HIGH (ref 0.44–1.00)
Creatinine, Ser: 1.28 mg/dL — ABNORMAL HIGH (ref 0.44–1.00)
GFR, Estimated: 28 mL/min — ABNORMAL LOW (ref >60)
GFR, Estimated: 42 mL/min — ABNORMAL LOW (ref >60)
Glucose, Bld: 109 mg/dL — ABNORMAL HIGH (ref 70–99)
Glucose, Bld: 80 mg/dL (ref 70–99)
Potassium: 3.4 mmol/L — ABNORMAL LOW (ref 3.5–5.1)
Potassium: 3.8 mmol/L (ref 3.5–5.1)
Sodium: 144 mmol/L (ref 135–145)
Sodium: 146 mmol/L — ABNORMAL HIGH (ref 135–145)
Total Bilirubin: 1.2 mg/dL (ref 0.3–1.2)
Total Bilirubin: 1 mg/dL (ref 0.3–1.2)
Total Protein: 5.4 g/dL — ABNORMAL LOW (ref 6.5–8.1)
Total Protein: 5.6 g/dL — ABNORMAL LOW (ref 6.5–8.1)

## 2020-06-17 LAB — BLOOD GAS, VENOUS
Acid-base deficit: 3.7 mmol/L — ABNORMAL HIGH (ref 0.0–2.0)
Bicarbonate: 20.7 mmol/L (ref 20.0–28.0)
Drawn by: 1444
O2 Saturation: 80.5 %
Patient temperature: 37
pCO2, Ven: 36.2 mmHg — ABNORMAL LOW (ref 44.0–60.0)
pH, Ven: 7.374 (ref 7.250–7.430)
pO2, Ven: 36.2 mmHg (ref 32.0–45.0)

## 2020-06-17 LAB — CBC
HCT: 41.6 % (ref 36.0–46.0)
HCT: 42.1 % (ref 36.0–46.0)
HCT: 50 % — ABNORMAL HIGH (ref 36.0–46.0)
Hemoglobin: 12.9 g/dL (ref 12.0–15.0)
Hemoglobin: 13.3 g/dL (ref 12.0–15.0)
Hemoglobin: 14.2 g/dL (ref 12.0–15.0)
MCH: 26.1 pg (ref 26.0–34.0)
MCH: 26.2 pg (ref 26.0–34.0)
MCH: 26.3 pg (ref 26.0–34.0)
MCHC: 28.4 g/dL — ABNORMAL LOW (ref 30.0–36.0)
MCHC: 31 g/dL (ref 30.0–36.0)
MCHC: 31.6 g/dL (ref 30.0–36.0)
MCV: 83.4 fL (ref 80.0–100.0)
MCV: 84.6 fL (ref 80.0–100.0)
MCV: 91.9 fL (ref 80.0–100.0)
Platelets: 145 10*3/uL — ABNORMAL LOW (ref 150–400)
Platelets: 146 10*3/uL — ABNORMAL LOW (ref 150–400)
Platelets: 160 10*3/uL (ref 150–400)
RBC: 4.92 MIL/uL (ref 3.87–5.11)
RBC: 5.05 MIL/uL (ref 3.87–5.11)
RBC: 5.44 MIL/uL — ABNORMAL HIGH (ref 3.87–5.11)
RDW: 15.9 % — ABNORMAL HIGH (ref 11.5–15.5)
RDW: 15.9 % — ABNORMAL HIGH (ref 11.5–15.5)
RDW: 22.8 % — ABNORMAL HIGH (ref 11.5–15.5)
WBC: 10.7 10*3/uL — ABNORMAL HIGH (ref 4.0–10.5)
WBC: 13.9 10*3/uL — ABNORMAL HIGH (ref 4.0–10.5)
WBC: 26.6 10*3/uL — ABNORMAL HIGH (ref 4.0–10.5)
nRBC: 0 % (ref 0.0–0.2)
nRBC: 0 % (ref 0.0–0.2)
nRBC: 0 % (ref 0.0–0.2)

## 2020-06-17 LAB — ECHOCARDIOGRAM LIMITED
Height: 62 in
Weight: 2606.72 oz

## 2020-06-17 LAB — URINE CULTURE

## 2020-06-17 LAB — MAGNESIUM: Magnesium: 1.9 mg/dL (ref 1.7–2.4)

## 2020-06-17 LAB — LACTIC ACID, PLASMA
Lactic Acid, Venous: 1.8 mmol/L (ref 0.5–1.9)
Lactic Acid, Venous: 1.9 mmol/L (ref 0.5–1.9)

## 2020-06-17 LAB — PROCALCITONIN: Procalcitonin: 10 ng/mL

## 2020-06-17 LAB — BRAIN NATRIURETIC PEPTIDE: B Natriuretic Peptide: 274.4 pg/mL — ABNORMAL HIGH (ref 0.0–100.0)

## 2020-06-17 MED ORDER — SORBITOL 70 % SOLN
960.0000 mL | TOPICAL_OIL | Freq: Once | ORAL | Status: AC
  Administered 2020-06-17: 960 mL via RECTAL
  Filled 2020-06-17: qty 473

## 2020-06-17 MED ORDER — ENOXAPARIN SODIUM 30 MG/0.3ML IJ SOSY
30.0000 mg | PREFILLED_SYRINGE | INTRAMUSCULAR | Status: DC
  Administered 2020-06-18 – 2020-06-19 (×2): 30 mg via SUBCUTANEOUS
  Filled 2020-06-17 (×2): qty 0.3

## 2020-06-17 MED ORDER — PERFLUTREN LIPID MICROSPHERE
1.0000 mL | INTRAVENOUS | Status: AC | PRN
  Administered 2020-06-17: 2 mL via INTRAVENOUS
  Filled 2020-06-17: qty 10

## 2020-06-17 NOTE — Progress Notes (Signed)
  Echocardiogram 2D Echocardiogram with contrast has been performed.  Roosvelt Maser F 06/17/2020, 2:23 PM

## 2020-06-17 NOTE — Progress Notes (Signed)
  Echocardiogram 2D Echocardiogram has been performed.  Roosvelt Maser F 06/17/2020, 2:26 PM

## 2020-06-17 NOTE — Progress Notes (Signed)
Progress Note  Patient Name: [name] Date of Encounter: 06/17/2020  Primary Cardiologist: Jodelle Red, MD  Subjective   Responsive to voice with mumbles.  Inpatient Medications    Scheduled Meds: . [START ON 06/18/2020] enoxaparin (LOVENOX) injection  30 mg Subcutaneous Q24H   Continuous Infusions: . amiodarone 30 mg/hr (06/17/20 0800)  . ampicillin-sulbactam (UNASYN) IV 3 g (06/17/20 0053)  . lactated ringers with kcl 50 mL/hr at 06/17/20 0048   PRN Meds: acetaminophen **OR** acetaminophen, albuterol   Vital Signs    Vitals:   06/17/20 0100 06/17/20 0300 06/17/20 0500 06/17/20 0800  BP: 93/69 100/69  93/66  Pulse: (!) 108 (!) 105  97  Resp: 16 14  14   Temp: 98 F (36.7 C) 97.9 F (36.6 C)    TempSrc: Axillary Axillary    SpO2: 94% 91%  96%  Weight:   73.9 kg   Height:        Intake/Output Summary (Last 24 hours) at 06/17/2020 0956 Last data filed at 06/17/2020 0800 Gross per 24 hour  Intake 4933.67 ml  Output 550 ml  Net 4383.67 ml   Filed Weights   06/15/20 1402 06/17/20 0500  Weight: 77.6 kg 73.9 kg    Telemetry    Sinus rhythm.  Personally reviewed.  ECG    An ECG dated 06/16/2020 was personally reviewed today and demonstrated:  Rapid atrial fibrillation with right bundle branch block and left anterior fascicular block.  Physical Exam   GEN:  Frail-appearing elderly woman, no acute distress.   HEENT: NG tube in place Neck: No JVD. Cardiac: RRR, no murmur or gallop.  Respiratory: Nonlabored. Clear to auscultation bilaterally. GI: Bowel sounds present. MS:  Mild lower leg edema.  Labs    Chemistry Recent Labs  Lab 06/15/20 1656 06/16/20 1836 06/16/20 2312  NA 143 146* 144  K 4.2 <2.0* 3.4*  CL 105 >130* 113*  CO2 26 11* 21*  GLUCOSE 132* 59* 109*  BUN 55* 31* 49*  CREATININE 2.15* 0.89 1.78*  CALCIUM 8.9 <4.0* 7.4*  PROT 6.4* <3.0* 5.4*  ALBUMIN 3.4* <1.0* 2.5*  AST 19 14* 18  ALT 15 7 14   ALKPHOS 81 24* 59   BILITOT 1.8* 0.5 1.2  GFRNONAA 22* >60 28*  ANIONGAP 12  --  10     Hematology Recent Labs  Lab 06/15/20 1410 06/16/20 2145 06/17/20 0040  WBC 8.5 11.1* 10.7*  RBC 6.13* 5.07 5.05  HGB 16.2* 13.4 13.3  HCT 52.0* 42.2 42.1  MCV 84.8 83.2 83.4  MCH 26.4 26.4 26.3  MCHC 31.2 31.8 31.6  RDW 16.3* 15.9* 15.9*  PLT 253 128* 145*    Cardiac Enzymes Recent Labs  Lab 06/16/20 1836  TROPONINIHS 16    BNP Recent Labs  Lab 06/16/20 2330  BNP 274.4*     DDimer Recent Labs  Lab 06/15/20 2025  DDIMER 12.14*     Radiology    DG Chest 1 View  Result Date: 06/15/2020 CLINICAL DATA:  Altered mental status. EXAM: CHEST  1 VIEW COMPARISON:  02/03/2018 FINDINGS: Normal sized heart. Gas distended colon beneath both hemidiaphragms. Poor inspiration. Minimal bibasilar atelectasis. Thoracic spine degenerative changes. IMPRESSION: 1. Gas distended colon beneath both hemidiaphragms. This could be an indication of colonic ileus or obstruction. 2. Poor inspiration with minimal bibasilar atelectasis. Electronically Signed   By: 08/15/2020 M.D.   On: 06/15/2020 15:13   CT Head Wo Contrast  Result Date: 06/15/2020 CLINICAL DATA:  Altered mental  status. EXAM: CT HEAD WITHOUT CONTRAST TECHNIQUE: Contiguous axial images were obtained from the base of the skull through the vertex without intravenous contrast. COMPARISON:  Head CT 09/03/2013 FINDINGS: Brain: No hemorrhage. Progressive atrophy from prior exam. Advanced periventricular white matter hypodensity, left greater than right, stable from prior exam and likely sequela of chronic small vessel ischemia. No evidence of acute infarct. No midline shift or mass effect. No subdural or extra-axial collection. Enlarged partially empty sella. Vascular: Atherosclerosis of skullbase vasculature without hyperdense vessel or abnormal calcification. Skull: No fracture or focal lesion. Sinuses/Orbits: Dystrophic left globe.  No acute findings. Other: None.  IMPRESSION: 1. No acute intracranial abnormality. 2. Progressive atrophy from 2015. Advanced chronic small vessel ischemia. Electronically Signed   By: Narda Rutherford M.D.   On: 06/15/2020 16:07   MR BRAIN WO CONTRAST  Result Date: 06/16/2020 CLINICAL DATA:  Neuro deficit, acute, stroke suspected. EXAM: MRI HEAD WITHOUT CONTRAST TECHNIQUE: Multiplanar, multiecho pulse sequences of the brain and surrounding structures were obtained without intravenous contrast. COMPARISON:  Prior head CT examinations 06/15/2020 and earlier. Brain MRI 09/05/2013. FINDINGS: Brain: Intermittently motion degraded examination. Most notably, there is mild-to-moderate motion degradation of the axial T2/FLAIR sequence. Moderate cerebral atrophy. Commensurate prominence of the ventricles and sulci. Comparatively mild cerebellar atrophy. Severe cerebral white matter chronic small vessel ischemic disease, progressed from the brain MRI of 09/05/2013. This includes a small chronic lacunar infarct within the left corona radiata/basal ganglia. There are fairly numerous scattered tiny foci of SWI signal loss compatible with chronic microhemorrhages within the supratentorial brain. There is no acute infarct. No evidence of intracranial mass. No extra-axial fluid collection. No midline shift. Partially empty sella turcica. Vascular: Signal abnormality within the intracranial left vertebral artery, severe stenosis or vessel occlusion at this site, unchanged from the brain MRI of 09/05/2013. Flow voids otherwise preserved within the proximal large arterial vessels. Skull and upper cervical spine: No focal marrow lesion. Incompletely assessed cervical spondylosis. Multilevel in least mild spinal canal stenosis within the visualized upper cervical spine. Sinuses/Orbits: Redemonstrated left phthisis bulbi. Prior right lens replacement. Trace bilateral ethmoid sinus mucosal thickening. IMPRESSION: Mild-to-moderate intermittent motion degradation, as  described. No evidence of acute intracranial abnormality. Moderate cerebral atrophy and severe cerebral white matter chronic small vessel ischemic disease, progressed from the brain MRI of 09/05/2013. Also progressed from this prior exam, there are fairly numerous chronic microhemorrhages within the supratentorial brain, raising the possibility of cerebral amyloid angiopathy. However, an atypical appearance of chronic hypertensive microangiopathy is also possible. Unchanged from the prior MRI of 09/05/2013, there is signal abnormality within the intracranial left vertebral artery suggestive of high-grade stenosis or vessel occlusion at this site. Redemonstrated left phthisis bulbi. Electronically Signed   By: Jackey Loge DO   On: 06/16/2020 15:21   NM Pulmonary Perfusion  Result Date: 06/16/2020 CLINICAL DATA:  Appy at pain EXAM: NUCLEAR MEDICINE PERFUSION LUNG SCAN TECHNIQUE: Perfusion images were obtained in multiple projections after intravenous injection of radiopharmaceutical. Views: Anterior, posterior, left lateral, right lateral, RPO, LPO, RAO, LAO RADIOPHARMACEUTICALS:  3.0 mCi Tc-35m MAA IV COMPARISON:  Chest radiograph Jun 15, 2020 FINDINGS: The distribution of radiotracer uptake bilaterally is homogeneous and symmetric. No focal perfusion defects are appreciable. IMPRESSION: No appreciable perfusion defects. There are no findings which are felt to be indicative of pulmonary embolus. Electronically Signed   By: Bretta Bang III M.D.   On: 06/16/2020 16:53   DG Abd Portable 1V-Small Bowel Obstruction Protocol-initial, 8 hr delay  Result  Date: 06/17/2020 CLINICAL DATA:  SBO 8 hour delayed film EXAM: PORTABLE ABDOMEN - 1 VIEW COMPARISON:  Jun 16, 2020 FINDINGS: Enteric tube with tip and side port overlying the stomach. Dilated gas-filled loops of small bowel are again seen throughout the abdomen. Radiopaque enteric contrast visualized in the left upper quadrant likely within the stomach. Large  calcified splenic lesion again visualized. IMPRESSION: Enteric tube with tip and side port overlying the stomach. Radiopaque enteric contrast visualized in the left upper quadrant likely within the stomach with dilated gas-filled loops of small and large bowel again seen throughout the abdomen. Electronically Signed   By: Maudry Mayhew MD   On: 06/17/2020 02:16   DG Abd Portable 1V-Small Bowel Protocol-Position Verification  Result Date: 06/16/2020 CLINICAL DATA:  Status post nasogastric tube placement. EXAM: PORTABLE ABDOMEN - 1 VIEW COMPARISON:  CT abdomen and pelvis dated 06/15/2020 FINDINGS: Enteric tube has been placed and appears adequately positioned in the stomach. Gas-filled dilated small bowel loops are again appreciated throughout the abdomen, better demonstrated on yesterday's CT, compatible with obstruction versus ileus. IMPRESSION: 1. Enteric tube appears adequately positioned in the stomach. 2. Continued evidence of small bowel obstruction versus ileus. Electronically Signed   By: Bary Richard M.D.   On: 06/16/2020 11:08   VAS [place] LOWER EXTREMITY VENOUS (DVT) (ONLY [facility] & [facility])  Result Date: 06/16/2020  Lower Venous DVT Study Patient Name:  MASHONDA BROSKI  Date of Exam:   06/16/2020 Medical Rec #: [id number]         Accession #:    [id number] Date of Birth: Dec 05, 1940          Patient Gender: F Patient Age:   80Y Exam Location:  [facility] Procedure:      VAS [place] LOWER EXTREMITY VENOUS (DVT) Referring Phys: [id number] ANKIT NANAVATI --------------------------------------------------------------------------------  Indications: Edema.  Comparison Study: no prior Performing Technologist: Blanch Media RVS  Examination Guidelines: A complete evaluation includes B-mode imaging, spectral Doppler, color Doppler, and power Doppler as needed of all accessible portions of each vessel. Bilateral testing is considered an integral part of a complete examination. Limited examinations for reoccurring  indications may be performed as noted. The reflux portion of the exam is performed with the patient in reverse Trendelenburg.  +---------+---------------+---------+-----------+----------+-------------------+ RIGHT    CompressibilityPhasicitySpontaneityPropertiesThrombus Aging      +---------+---------------+---------+-----------+----------+-------------------+ CFV      None           Yes      Yes                  Age Indeterminate   +---------+---------------+---------+-----------+----------+-------------------+ SFJ      None                                         Age Indeterminate   +---------+---------------+---------+-----------+----------+-------------------+ FV Prox  None                                         Age Indeterminate   +---------+---------------+---------+-----------+----------+-------------------+ FV Mid   Partial                                      Age Indeterminate   +---------+---------------+---------+-----------+----------+-------------------+ FV DistalPartial  Age Indeterminate   +---------+---------------+---------+-----------+----------+-------------------+ PFV      Partial                                      Age Indeterminate   +---------+---------------+---------+-----------+----------+-------------------+ POP      Partial        Yes      Yes                  Age Indeterminate   +---------+---------------+---------+-----------+----------+-------------------+ PTV      Partial                                      Age Indeterminate   +---------+---------------+---------+-----------+----------+-------------------+ PERO                                                  Not well visualized +---------+---------------+---------+-----------+----------+-------------------+   +---------+---------------+---------+-----------+----------+--------------+ LEFT      CompressibilityPhasicitySpontaneityPropertiesThrombus Aging +---------+---------------+---------+-----------+----------+--------------+ CFV      Full           Yes      Yes                                 +---------+---------------+---------+-----------+----------+--------------+ SFJ      Full                                                        +---------+---------------+---------+-----------+----------+--------------+ FV Prox  Full                                                        +---------+---------------+---------+-----------+----------+--------------+ FV Mid   Full                                                        +---------+---------------+---------+-----------+----------+--------------+ FV DistalFull                                                        +---------+---------------+---------+-----------+----------+--------------+ PFV      Full                                                        +---------+---------------+---------+-----------+----------+--------------+ POP      Full           Yes      Yes                                 +---------+---------------+---------+-----------+----------+--------------+  PTV      Full                                                        +---------+---------------+---------+-----------+----------+--------------+ PERO     Full                                                        +---------+---------------+---------+-----------+----------+--------------+     Summary: RIGHT: - Findings consistent with age indeterminate deep vein thrombosis involving the right common femoral vein, SF junction, right femoral vein, right proximal profunda vein, right popliteal vein, and right posterior tibial veins. - No cystic structure found in the popliteal fossa.  LEFT: - There is no evidence of deep vein thrombosis in the lower extremity.  - No cystic structure found in the popliteal fossa.  *See  table(s) above for measurements and observations. Electronically signed by Lemar Livings MD on 06/16/2020 at 4:58:05 PM.    Final    CT CHEST ABDOMEN PELVIS WO CONTRAST  Result Date: 06/15/2020 CLINICAL DATA:  New onset respiratory failure and abdominal pain, initial encounter EXAM: CT CHEST, ABDOMEN AND PELVIS WITHOUT CONTRAST TECHNIQUE: Multidetector CT imaging of the chest, abdomen and pelvis was performed following the standard protocol without IV contrast. COMPARISON:  Chest x-ray from earlier in the same day, CT from 02/11/2018 FINDINGS: CT CHEST FINDINGS Cardiovascular: Limited due to lack of IV contrast. Atherosclerotic calcifications of the thoracic aorta are noted without aneurysmal dilatation. No cardiac enlargement is seen. No significant coronary calcifications are noted. Mediastinum/Nodes: Thoracic inlet is within normal limits. The esophagus as visualized is unremarkable. No hilar or mediastinal adenopathy is noted. Lungs/Pleura: Lungs are well aerated bilaterally. Patchy airspace opacities are noted in the lower lobes bilaterally consistent with early infiltrate. Associated small right-sided pleural effusion is noted as well. No sizable parenchymal nodule is seen. No pneumothorax is noted. Musculoskeletal: Degenerative changes of the thoracic spine are seen. No acute bony abnormality is noted. CT ABDOMEN PELVIS FINDINGS Hepatobiliary: Liver is within normal limits. Gallbladder demonstrates vicarious excretion of contrast material or multiple tiny stones. No complicating factors are seen. Pancreas: Unremarkable. No pancreatic ductal dilatation or surrounding inflammatory changes. Spleen: Large rim calcification is noted centrally within the spleen stable in appearance from the prior exam and dating back to 2015. Adrenals/Urinary Tract: Adrenal glands are within normal limits. Kidneys demonstrate no obstructive change. Small exophytic cyst is noted from the right kidney stable in appearance from the  prior exam. No renal calculi are seen. The bladder is decompressed. Stomach/Bowel: Increased fecal material is noted within the rectal vault consistent with the degree of impaction. The more proximal colon however is decompressed. The appendix is not well visualized although no inflammatory changes to suggest appendicitis are seen. Stomach is well distended with fluid and ingested food stuffs. The small bowel is dilated starting at the duodenum. Fluid-filled dilated loops of jejunum and ileum are seen extending to the level of the ascending colon. No discrete transitional zone is noted. Correlation with the physical exam is recommended as to the bowel sounds as this could represent a high-grade small bowel obstruction or small-bowel ileus. Vascular/Lymphatic: Aortic atherosclerosis. No enlarged  abdominal or pelvic lymph nodes. Reproductive: Uterus and bilateral adnexa are unremarkable. Other: No abdominal wall hernia or abnormality. No abdominopelvic ascites. Musculoskeletal: Degenerative changes of the lumbar spine are noted. No compression deformities are seen. IMPRESSION: CT of the chest: Patchy airspace opacities in the bases bilaterally consistent with early multifocal pneumonia. Small right-sided effusion is noted. CT of the abdomen and pelvis: Multiple dilated loops of small bowel extending from the duodenum to the level of the terminal ileum and ascending colon. No discrete lesion is seen. Correlation with the physical exam is recommended as this could represent a high-grade distal obstruction or diffuse small bowel ileus. Vicarious excretion of contrast versus is multiple tiny stones in the gallbladder. No complicating factors are noted. Chronic changes in the spleen and kidneys stable from prior study. Electronically Signed   By: Alcide Clever M.D.   On: 06/15/2020 19:39    Patient Profile     80 y.o. female with a history of  remote PAF (patient refused anticoagulation in the past), Parkinson's  disease, dementia, nonobstructive CAD by cath 2013, anemia, anxiety, RBBB, chronic diastolic CHF, HTN, HLD, obesity, osteoarthritis who is being seen for the evaluation of rapid atrial fibrillation.   Assessment & Plan    1.  Paroxysmal to persistent atrial fibrillation with CHA2DS2-VASc score of 6.  Currently maintaining sinus rhythm on IV amiodarone having converted overnight.  Anticoagulation declined previously.  2. Multiple medical issues being addressed by primary team including SBO, progressive hypotension/possible shock, concern for aspiration/difficulty swallowing and worsening mental status, RLE DVT, progressive chronic microhemorrhages in the brain.  Palliative care consulted regarding overall goals of care.  She is presently DNR.  Continue amiodarone infusion for now, patient n.p.o. with NG tube in place.  Signed, Nona Dell, MD  06/17/2020, 9:56 AM

## 2020-06-17 NOTE — Plan of Care (Signed)
  Problem: Pain Managment: Goal: General experience of comfort will improve Outcome: Progressing   Problem: Skin Integrity: Goal: Risk for impaired skin integrity will decrease Outcome: Progressing   

## 2020-06-17 NOTE — Progress Notes (Addendum)
Date and time results received: 06/16/20 @ 2134  Test: Potassium, chloride, and calcium Critical Value: < 2.0, >130, and < 4.0  Name of Provider Notified: [facility] Dr. Margo Aye  Orders Received? Or Actions Taken?: See new orders

## 2020-06-17 NOTE — Progress Notes (Addendum)
[facility] Progress Note     Subjective: CC:  Pleasantly demented. Oriented to self only. Repeats words back to me, mumbles, not answering questions. Responds to pain.   Objective: Vital signs in last 24 hours: Temp:  [97.9 F (36.6 C)-98.1 F (36.7 C)] 97.9 F (36.6 C) (05/14 0300) Pulse Rate:  [77-152] 105 (05/14 0300) Resp:  [12-21] 14 (05/14 0300) BP: (62-125)/(41-91) 100/69 (05/14 0300) SpO2:  [88 %-100 %] 91 % (05/14 0300) Weight:  [73.9 kg] 73.9 kg (05/14 0500)    Intake/Output from previous day: 05/13 0701 - 05/14 0700 In: 4850.3 [I.V.:2521.3; IV Piggyback:2329] Out: 550 [Urine:500; Emesis/NG output:50] Intake/Output this shift: No intake/output data recorded.  PE: Gen:  Somnolent, NAD, pleasant elderly female in soft restraints Card:  Irregular rhythm, regular rate, pedal pulses 2+ BL Pulm:  Normal effort, clear to auscultation bilaterally Abd: Soft, TTP epiagtrium and LUQ without peritonitis, abdomen is mildly distended with upper abdominal fullness, hypoactive BS  NG (73F)- 50 cc/24h clear/bilious, lopez valve removed, NG flushed, it is working.  Skin: warm and dry, no rashes  Psych: A&Ox3   Lab Results:  Recent Labs    06/16/20 2145 06/17/20 0040  WBC 11.1* 10.7*  HGB 13.4 13.3  HCT 42.2 42.1  PLT 128* 145*   BMET Recent Labs    06/16/20 1836 06/16/20 2312  NA 146* 144  K <2.0* 3.4*  CL >130* 113*  CO2 11* 21*  GLUCOSE 59* 109*  BUN 31* 49*  CREATININE 0.89 1.78*  CALCIUM <4.0* 7.4*   PT/INR No results for input(s): LABPROT, INR in the last 72 hours. CMP     Component Value Date/Time   NA 144 06/16/2020 2312   K 3.4 (L) 06/16/2020 2312   CL 113 (H) 06/16/2020 2312   CO2 21 (L) 06/16/2020 2312   GLUCOSE 109 (H) 06/16/2020 2312   BUN 49 (H) 06/16/2020 2312   CREATININE 1.78 (H) 06/16/2020 2312   CALCIUM 7.4 (L) 06/16/2020 2312   PROT 5.4 (L) 06/16/2020 2312   ALBUMIN 2.5 (L) 06/16/2020 2312   AST 18 06/16/2020 2312    ALT 14 06/16/2020 2312   ALKPHOS 59 06/16/2020 2312   BILITOT 1.2 06/16/2020 2312   GFRNONAA 28 (L) 06/16/2020 2312   GFRAA >60 02/13/2018 0701   Lipase     Component Value Date/Time   LIPASE 63 (H) 01/27/2018 1110       Studies/Results: DG Chest 1 View  Result Date: 06/15/2020 CLINICAL DATA:  Altered mental status. EXAM: CHEST  1 VIEW COMPARISON:  02/03/2018 FINDINGS: Normal sized heart. Gas distended colon beneath both hemidiaphragms. Poor inspiration. Minimal bibasilar atelectasis. Thoracic spine degenerative changes. IMPRESSION: 1. Gas distended colon beneath both hemidiaphragms. This could be an indication of colonic ileus or obstruction. 2. Poor inspiration with minimal bibasilar atelectasis. Electronically Signed   By: Beckie Salts M.D.   On: 06/15/2020 15:13   CT Head Wo Contrast  Result Date: 06/15/2020 CLINICAL DATA:  Altered mental status. EXAM: CT HEAD WITHOUT CONTRAST TECHNIQUE: Contiguous axial images were obtained from the base of the skull through the vertex without intravenous contrast. COMPARISON:  Head CT 09/03/2013 FINDINGS: Brain: No hemorrhage. Progressive atrophy from prior exam. Advanced periventricular white matter hypodensity, left greater than right, stable from prior exam and likely sequela of chronic small vessel ischemia. No evidence of acute infarct. No midline shift or mass effect. No subdural or extra-axial collection. Enlarged partially empty sella. Vascular: Atherosclerosis of skullbase vasculature without hyperdense vessel or  abnormal calcification. Skull: No fracture or focal lesion. Sinuses/Orbits: Dystrophic left globe.  No acute findings. Other: None. IMPRESSION: 1. No acute intracranial abnormality. 2. Progressive atrophy from 2015. Advanced chronic small vessel ischemia. Electronically Signed   By: Narda Rutherford M.D.   On: 06/15/2020 16:07   MR BRAIN WO CONTRAST  Result Date: 06/16/2020 CLINICAL DATA:  Neuro deficit, acute, stroke suspected.  EXAM: MRI HEAD WITHOUT CONTRAST TECHNIQUE: Multiplanar, multiecho pulse sequences of the brain and surrounding structures were obtained without intravenous contrast. COMPARISON:  Prior head CT examinations 06/15/2020 and earlier. Brain MRI 09/05/2013. FINDINGS: Brain: Intermittently motion degraded examination. Most notably, there is mild-to-moderate motion degradation of the axial T2/FLAIR sequence. Moderate cerebral atrophy. Commensurate prominence of the ventricles and sulci. Comparatively mild cerebellar atrophy. Severe cerebral white matter chronic small vessel ischemic disease, progressed from the brain MRI of 09/05/2013. This includes a small chronic lacunar infarct within the left corona radiata/basal ganglia. There are fairly numerous scattered tiny foci of SWI signal loss compatible with chronic microhemorrhages within the supratentorial brain. There is no acute infarct. No evidence of intracranial mass. No extra-axial fluid collection. No midline shift. Partially empty sella turcica. Vascular: Signal abnormality within the intracranial left vertebral artery, severe stenosis or vessel occlusion at this site, unchanged from the brain MRI of 09/05/2013. Flow voids otherwise preserved within the proximal large arterial vessels. Skull and upper cervical spine: No focal marrow lesion. Incompletely assessed cervical spondylosis. Multilevel in least mild spinal canal stenosis within the visualized upper cervical spine. Sinuses/Orbits: Redemonstrated left phthisis bulbi. Prior right lens replacement. Trace bilateral ethmoid sinus mucosal thickening. IMPRESSION: Mild-to-moderate intermittent motion degradation, as described. No evidence of acute intracranial abnormality. Moderate cerebral atrophy and severe cerebral white matter chronic small vessel ischemic disease, progressed from the brain MRI of 09/05/2013. Also progressed from this prior exam, there are fairly numerous chronic microhemorrhages within the  supratentorial brain, raising the possibility of cerebral amyloid angiopathy. However, an atypical appearance of chronic hypertensive microangiopathy is also possible. Unchanged from the prior MRI of 09/05/2013, there is signal abnormality within the intracranial left vertebral artery suggestive of high-grade stenosis or vessel occlusion at this site. Redemonstrated left phthisis bulbi. Electronically Signed   By: Jackey Loge DO   On: 06/16/2020 15:21   NM Pulmonary Perfusion  Result Date: 06/16/2020 CLINICAL DATA:  Appy at pain EXAM: NUCLEAR MEDICINE PERFUSION LUNG SCAN TECHNIQUE: Perfusion images were obtained in multiple projections after intravenous injection of radiopharmaceutical. Views: Anterior, posterior, left lateral, right lateral, RPO, LPO, RAO, LAO RADIOPHARMACEUTICALS:  3.0 mCi Tc-54m MAA IV COMPARISON:  Chest radiograph Jun 15, 2020 FINDINGS: The distribution of radiotracer uptake bilaterally is homogeneous and symmetric. No focal perfusion defects are appreciable. IMPRESSION: No appreciable perfusion defects. There are no findings which are felt to be indicative of pulmonary embolus. Electronically Signed   By: Bretta Bang III M.D.   On: 06/16/2020 16:53   DG Abd Portable 1V-Small Bowel Obstruction Protocol-initial, 8 hr delay  Result Date: 06/17/2020 CLINICAL DATA:  SBO 8 hour delayed film EXAM: PORTABLE ABDOMEN - 1 VIEW COMPARISON:  Jun 16, 2020 FINDINGS: Enteric tube with tip and side port overlying the stomach. Dilated gas-filled loops of small bowel are again seen throughout the abdomen. Radiopaque enteric contrast visualized in the left upper quadrant likely within the stomach. Large calcified splenic lesion again visualized. IMPRESSION: Enteric tube with tip and side port overlying the stomach. Radiopaque enteric contrast visualized in the left upper quadrant likely within the stomach with  dilated gas-filled loops of small and large bowel again seen throughout the abdomen.  Electronically Signed   By: Maudry Mayhew MD   On: 06/17/2020 02:16   DG Abd Portable 1V-Small Bowel Protocol-Position Verification  Result Date: 06/16/2020 CLINICAL DATA:  Status post nasogastric tube placement. EXAM: PORTABLE ABDOMEN - 1 VIEW COMPARISON:  CT abdomen and pelvis dated 06/15/2020 FINDINGS: Enteric tube has been placed and appears adequately positioned in the stomach. Gas-filled dilated small bowel loops are again appreciated throughout the abdomen, better demonstrated on yesterday's CT, compatible with obstruction versus ileus. IMPRESSION: 1. Enteric tube appears adequately positioned in the stomach. 2. Continued evidence of small bowel obstruction versus ileus. Electronically Signed   By: Bary Richard M.D.   On: 06/16/2020 11:08   VAS [place] LOWER EXTREMITY VENOUS (DVT) (ONLY [facility] & [facility])  Result Date: 06/16/2020  Lower Venous DVT Study Patient Name:  AMELIE CARACCI  Date of Exam:   06/16/2020 Medical Rec #: [id number]         Accession #:    [id number] Date of Birth: [birth year]          Patient Gender: F Patient Age:   80Y Exam Location:  [facility] Procedure:      VAS [place] LOWER EXTREMITY VENOUS (DVT) Referring Phys: [id number] ANKIT NANAVATI --------------------------------------------------------------------------------  Indications: Edema.  Comparison Study: no prior Performing Technologist: Blanch Media RVS  Examination Guidelines: A complete evaluation includes B-mode imaging, spectral Doppler, color Doppler, and power Doppler as needed of all accessible portions of each vessel. Bilateral testing is considered an integral part of a complete examination. Limited examinations for reoccurring indications may be performed as noted. The reflux portion of the exam is performed with the patient in reverse Trendelenburg.  +---------+---------------+---------+-----------+----------+-------------------+ RIGHT    CompressibilityPhasicitySpontaneityPropertiesThrombus Aging       +---------+---------------+---------+-----------+----------+-------------------+ CFV      None           Yes      Yes                  Age Indeterminate   +---------+---------------+---------+-----------+----------+-------------------+ SFJ      None                                         Age Indeterminate   +---------+---------------+---------+-----------+----------+-------------------+ FV Prox  None                                         Age Indeterminate   +---------+---------------+---------+-----------+----------+-------------------+ FV Mid   Partial                                      Age Indeterminate   +---------+---------------+---------+-----------+----------+-------------------+ FV DistalPartial                                      Age Indeterminate   +---------+---------------+---------+-----------+----------+-------------------+ PFV      Partial                                      Age Indeterminate   +---------+---------------+---------+-----------+----------+-------------------+  POP      Partial        Yes      Yes                  Age Indeterminate   +---------+---------------+---------+-----------+----------+-------------------+ PTV      Partial                                      Age Indeterminate   +---------+---------------+---------+-----------+----------+-------------------+ PERO                                                  Not well visualized +---------+---------------+---------+-----------+----------+-------------------+   +---------+---------------+---------+-----------+----------+--------------+ LEFT     CompressibilityPhasicitySpontaneityPropertiesThrombus Aging +---------+---------------+---------+-----------+----------+--------------+ CFV      Full           Yes      Yes                                 +---------+---------------+---------+-----------+----------+--------------+ SFJ      Full                                                         +---------+---------------+---------+-----------+----------+--------------+ FV Prox  Full                                                        +---------+---------------+---------+-----------+----------+--------------+ FV Mid   Full                                                        +---------+---------------+---------+-----------+----------+--------------+ FV DistalFull                                                        +---------+---------------+---------+-----------+----------+--------------+ PFV      Full                                                        +---------+---------------+---------+-----------+----------+--------------+ POP      Full           Yes      Yes                                 +---------+---------------+---------+-----------+----------+--------------+ PTV      Full                                                        +---------+---------------+---------+-----------+----------+--------------+  PERO     Full                                                        +---------+---------------+---------+-----------+----------+--------------+     Summary: RIGHT: - Findings consistent with age indeterminate deep vein thrombosis involving the right common femoral vein, SF junction, right femoral vein, right proximal profunda vein, right popliteal vein, and right posterior tibial veins. - No cystic structure found in the popliteal fossa.  LEFT: - There is no evidence of deep vein thrombosis in the lower extremity.  - No cystic structure found in the popliteal fossa.  *See table(s) above for measurements and observations. Electronically signed by Lemar LivingsBrandon Cain MD on 06/16/2020 at 4:58:05 PM.    Final    CT CHEST ABDOMEN PELVIS WO CONTRAST  Result Date: 06/15/2020 CLINICAL DATA:  New onset respiratory failure and abdominal pain, initial encounter EXAM: CT CHEST, ABDOMEN AND PELVIS WITHOUT  CONTRAST TECHNIQUE: Multidetector CT imaging of the chest, abdomen and pelvis was performed following the standard protocol without IV contrast. COMPARISON:  Chest x-ray from earlier in the same day, CT from 02/11/2018 FINDINGS: CT CHEST FINDINGS Cardiovascular: Limited due to lack of IV contrast. Atherosclerotic calcifications of the thoracic aorta are noted without aneurysmal dilatation. No cardiac enlargement is seen. No significant coronary calcifications are noted. Mediastinum/Nodes: Thoracic inlet is within normal limits. The esophagus as visualized is unremarkable. No hilar or mediastinal adenopathy is noted. Lungs/Pleura: Lungs are well aerated bilaterally. Patchy airspace opacities are noted in the lower lobes bilaterally consistent with early infiltrate. Associated small right-sided pleural effusion is noted as well. No sizable parenchymal nodule is seen. No pneumothorax is noted. Musculoskeletal: Degenerative changes of the thoracic spine are seen. No acute bony abnormality is noted. CT ABDOMEN PELVIS FINDINGS Hepatobiliary: Liver is within normal limits. Gallbladder demonstrates vicarious excretion of contrast material or multiple tiny stones. No complicating factors are seen. Pancreas: Unremarkable. No pancreatic ductal dilatation or surrounding inflammatory changes. Spleen: Large rim calcification is noted centrally within the spleen stable in appearance from the prior exam and dating back to 2015. Adrenals/Urinary Tract: Adrenal glands are within normal limits. Kidneys demonstrate no obstructive change. Small exophytic cyst is noted from the right kidney stable in appearance from the prior exam. No renal calculi are seen. The bladder is decompressed. Stomach/Bowel: Increased fecal material is noted within the rectal vault consistent with the degree of impaction. The more proximal colon however is decompressed. The appendix is not well visualized although no inflammatory changes to suggest appendicitis  are seen. Stomach is well distended with fluid and ingested food stuffs. The small bowel is dilated starting at the duodenum. Fluid-filled dilated loops of jejunum and ileum are seen extending to the level of the ascending colon. No discrete transitional zone is noted. Correlation with the physical exam is recommended as to the bowel sounds as this could represent a high-grade small bowel obstruction or small-bowel ileus. Vascular/Lymphatic: Aortic atherosclerosis. No enlarged abdominal or pelvic lymph nodes. Reproductive: Uterus and bilateral adnexa are unremarkable. Other: No abdominal wall hernia or abnormality. No abdominopelvic ascites. Musculoskeletal: Degenerative changes of the lumbar spine are noted. No compression deformities are seen. IMPRESSION: CT of the chest: Patchy airspace opacities in the bases bilaterally consistent with early multifocal pneumonia. Small right-sided effusion is noted. CT of the abdomen and  pelvis: Multiple dilated loops of small bowel extending from the duodenum to the level of the terminal ileum and ascending colon. No discrete lesion is seen. Correlation with the physical exam is recommended as this could represent a high-grade distal obstruction or diffuse small bowel ileus. Vicarious excretion of contrast versus is multiple tiny stones in the gallbladder. No complicating factors are noted. Chronic changes in the spleen and kidneys stable from prior study. Electronically Signed   By: Alcide Clever M.D.   On: 06/15/2020 19:39    Anti-infectives: Anti-infectives (From admission, onward)   Start     Dose/Rate Route Frequency Ordered Stop   06/16/20 1200  Ampicillin-Sulbactam (UNASYN) 3 g in sodium chloride 0.9 % 100 mL IVPB        3 g 200 mL/hr over 30 Minutes Intravenous Every 12 hours 06/16/20 1153     06/15/20 2100  Ampicillin-Sulbactam (UNASYN) 3 g in sodium chloride 0.9 % 100 mL IVPB        3 g 200 mL/hr over 30 Minutes Intravenous  Once 06/15/20 2009 06/15/20 2204    06/15/20 2100  vancomycin (VANCOREADY) IVPB 1000 mg/200 mL        1,000 mg 200 mL/hr over 60 Minutes Intravenous  Once 06/15/20 2009 06/16/20 0023      Assessment/Plan hypertension paroxysmal atrial fibrillation 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  - NGT to LIWS  - SB protocol, 8h delay with contrast in LUQ, follow up 24h film (around 12PM) - no emergent surgical needs. - given age, debility, and co-morbidities patients perioperative morbidity/mortality risk would be high.   FEN: NPO, NGT LIWS, IVF ID: [email or web address] VTE: SCD's, therapeutic lovenox for DVT Foley: external cath Code status: DNR/DNI     LOS: 1 day    Hosie Spangle, [facility] Please see Amion for pager number during day hours 7:00am-4:30pm

## 2020-06-17 NOTE — Progress Notes (Signed)
PROGRESS NOTE  HEAVAN [name] RSW:[id number] DOB: 10/09/1940 DOA: 06/15/2020 PCP: [name] Glazier, MD   LOS: 1 day   Brief Narrative / Interim history: 80 year old female with HTN, PAF, diastolic CHF last EF 09-38%, prior CVA with residual right-sided weakness, dementia comes in from her nursing facility being acutely altered.  At baseline she is able to recognize family members, answering yes and no questions but over the last couple of days she has been poorly responsive and has had several episodes of vomiting.  She has been more lethargic.  She was brought to the hospital and ED she was found to be hypotensive, also CT of the abdomen and pelvis showed multiple distended loops of bowel concerning for small bowel obstruction.  Imaging also was concerning for multifocal pneumonia.  General surgery was consulted, she had an NG tube placed and she was placed on antibiotic for aspiration pneumonia.  She was found to have A. fib with RVR with wide-complex tachycardia and cardiology was consulted and she was placed on amiodarone drip.  Given poor baseline and multiple medical problems palliative care was consulted as well  Subjective / 24h Interval events: Seen in bed, she responds to simple questions, no apparent discomfort  Assessment & Plan: Principal Problem Small bowel obstruction -conservative management for now, appreciate general surgery follow-up.  Continue NG tube, IV fluids and conservative care -Not really a good candidate for surgery  Active Problems Severe sepsis due to aspiration pneumonia-met criteria with hypotension, tachycardia, as well as elevated lactic acid.  She was fluid resuscitated and blood pressure has improved. -Continue fluids this morning, sepsis physiology improved today. -Continue Unasyn, today day #2, procalcitonin was elevated  PAF, A. fib with RVR -patient has a history of paroxysmal A. fib developed RVR in the ED in the setting of pneumonia, dehydration.   Cardiology consulted -Continue amiodarone, 2D echo pending -Not anticoagulated due to concern for high risk of brain bleed  Hypokalemia-due to losses, replete and recheck labs this morning  Age indeterminate DVT - involving the right common femoral vein, SF junction, right femoral vein, right proximal profunda vein, right popliteal vein, and right posterior tibial veins.  Hold anticoagulation due to high risk of brain bleed until discussing with the family  Acute hypoxic respiratory failure -multifactorial in the setting of aspiration pneumonia, small right-sided effusion.  Provide supplemental oxygen, monitor closely fluid status  Chronic microhemorrhages within the supratentorial brain -progressive, discussed with neurology over the phone, given concern for amyloid there is a fairly decent chance of bleed.  Chronic diastolic CHF -slight lower extremity edema, suspect chronic.  Monitor fluid status closely.  BNP elevated at 274  Acute kidney injury -likely in the setting of sepsis, dehydration due to vomiting secondary to bowel obstruction.  Repeat  Coronary artery disease -no chest pain, stable.  Negative high-sensitivity troponin  Goals of care-family confirms DNR but want full medical care at this point  Scheduled Meds: . [START ON 06/18/2020] enoxaparin (LOVENOX) injection  30 mg Subcutaneous Q24H   Continuous Infusions: . amiodarone 30 mg/hr (06/17/20 0323)  . ampicillin-sulbactam (UNASYN) IV 3 g (06/17/20 0053)  . lactated ringers with kcl 50 mL/hr at 06/17/20 0048   PRN Meds:.acetaminophen **OR** acetaminophen, albuterol  DVT prophylaxis: enoxaparin (LOVENOX) injection 30 mg Start: 06/18/20 0900     Code Status: DNR  Family Communication: No family at bedside this morning, will discuss later  Status is: Inpatient  Remains inpatient appropriate because:Inpatient level of care appropriate due to severity of  illness   Dispo: The patient is from: SNF               Anticipated d/c is to: SNF              Patient currently is not medically stable to d/c.   Difficult to place patient No  Level of care: Progressive  Consultants:  General surgery Cardiology Critical care Palliative care  Procedures:  2D echo: Pending  Microbiology  none  Antimicrobials: Unasyn 5/13 >>    Objective: Vitals:   06/17/20 0100 06/17/20 0300 06/17/20 0500 06/17/20 0800  BP: 93/69 100/69  93/66  Pulse: (!) 108 (!) 105  97  Resp: '16 14  14  ' Temp: 98 F (36.7 C) 97.9 F (36.6 C)    TempSrc: Axillary Axillary    SpO2: 94% 91%  96%  Weight:   73.9 kg   Height:        Intake/Output Summary (Last 24 hours) at 06/17/2020 0948 Last data filed at 06/17/2020 1610 Gross per 24 hour  Intake 4850.3 ml  Output 550 ml  Net 4300.3 ml   Filed Weights   06/15/20 1402 06/17/20 0500  Weight: 77.6 kg 73.9 kg    Examination:  Constitutional: No apparent distress, in bed Eyes: no scleral icterus ENMT: Mucous membranes are moist.  Neck: normal, supple Respiratory: clear to auscultation bilaterally, no wheezing, no crackles. Normal respiratory effort. No accessory muscle use.  Cardiovascular: irregular, no murmurs / rubs / gallops. 1+ pitting LE edema. Good peripheral pulses Abdomen: non distended, no tenderness. Bowel sounds positive.  NG tube in place Musculoskeletal: no clubbing / cyanosis.  Skin: no rashes Neurologic: Appears nonfocal  Data Reviewed: I have independently reviewed following labs and imaging studies   CBC: Recent Labs  Lab 06/15/20 1410 06/16/20 2145 06/17/20 0040  WBC 8.5 11.1* 10.7*  NEUTROABS 6.2  --   --   HGB 16.2* 13.4 13.3  HCT 52.0* 42.2 42.1  MCV 84.8 83.2 83.4  PLT 253 128* 960*   Basic Metabolic Panel: Recent Labs  Lab 06/15/20 1656 06/16/20 1836 06/16/20 2312  NA 143 146* 144  K 4.2 <2.0* 3.4*  CL 105 >130* 113*  CO2 26 11* 21*  GLUCOSE 132* 59* 109*  BUN 55* 31* 49*  CREATININE 2.15* 0.89 1.78*  CALCIUM 8.9  <4.0* 7.4*  MG  --   --  1.9   Liver Function Tests: Recent Labs  Lab 06/15/20 1656 06/16/20 1836 06/16/20 2312  AST 19 14* 18  ALT '15 7 14  ' ALKPHOS 81 24* 59  BILITOT 1.8* 0.5 1.2  PROT 6.4* <3.0* 5.4*  ALBUMIN 3.4* <1.0* 2.5*   Coagulation Profile: No results for input(s): INR, PROTIME in the last 168 hours. HbA1C: No results for input(s): HGBA1C in the last 72 hours. CBG: Recent Labs  Lab 06/15/20 1920  GLUCAP 150*    Recent Results (from the past 240 hour(s))  SARS CORONAVIRUS 2 (TAT 6-24 HRS) Nasopharyngeal Urine, Clean Catch     Status: None   Collection Time: 06/15/20  7:24 PM   Specimen: Urine, Clean Catch; Nasopharyngeal  Result Value Ref Range Status   SARS Coronavirus 2 NEGATIVE NEGATIVE Final    Comment: (NOTE) SARS-CoV-2 target nucleic acids are NOT DETECTED.  The SARS-CoV-2 RNA is generally detectable in upper and lower respiratory specimens during the acute phase of infection. Negative results do not preclude SARS-CoV-2 infection, do not rule out co-infections with other pathogens, and should not be used  as the sole basis for treatment or other patient management decisions. Negative results must be combined with clinical observations, patient history, and epidemiological information. The expected result is Negative.  Fact Sheet for Patients: SugarRoll.be  Fact Sheet for Healthcare Providers: https://www.woods-mathews.com/  This test is not yet approved or cleared by the [place] FDA and  has been authorized for detection and/or diagnosis of SARS-CoV-2 by FDA under an Emergency Use Authorization (EUA). This EUA will remain  in effect (meaning this test can be used) for the duration of the COVID-19 declaration under Se ction 564(b)(1) of the Act, 21 U.S.C. section 360bbb-3(b)(1), unless the authorization is terminated or revoked sooner.  Performed at [facility], [place] [address],  [place], [place] [address]      Radiology Studies: MR BRAIN WO CONTRAST  Result Date: 06/16/2020 CLINICAL DATA:  Neuro deficit, acute, stroke suspected. EXAM: MRI HEAD WITHOUT CONTRAST TECHNIQUE: Multiplanar, multiecho pulse sequences of the brain and surrounding structures were obtained without intravenous contrast. COMPARISON:  Prior head CT examinations 06/15/2020 and earlier. Brain MRI 09/05/2013. FINDINGS: Brain: Intermittently motion degraded examination. Most notably, there is mild-to-moderate motion degradation of the axial T2/FLAIR sequence. Moderate cerebral atrophy. Commensurate prominence of the ventricles and sulci. Comparatively mild cerebellar atrophy. Severe cerebral white matter chronic small vessel ischemic disease, progressed from the brain MRI of 09/05/2013. This includes a small chronic lacunar infarct within the left corona radiata/basal ganglia. There are fairly numerous scattered tiny foci of SWI signal loss compatible with chronic microhemorrhages within the supratentorial brain. There is no acute infarct. No evidence of intracranial mass. No extra-axial fluid collection. No midline shift. Partially empty sella turcica. Vascular: Signal abnormality within the intracranial left vertebral artery, severe stenosis or vessel occlusion at this site, unchanged from the brain MRI of 09/05/2013. Flow voids otherwise preserved within the proximal large arterial vessels. Skull and upper cervical spine: No focal marrow lesion. Incompletely assessed cervical spondylosis. Multilevel in least mild spinal canal stenosis within the visualized upper cervical spine. Sinuses/Orbits: Redemonstrated left phthisis bulbi. Prior right lens replacement. Trace bilateral ethmoid sinus mucosal thickening. IMPRESSION: Mild-to-moderate intermittent motion degradation, as described. No evidence of acute intracranial abnormality. Moderate cerebral atrophy and severe cerebral white matter chronic small vessel ischemic  disease, progressed from the brain MRI of 09/05/2013. Also progressed from this prior exam, there are fairly numerous chronic microhemorrhages within the supratentorial brain, raising the possibility of cerebral amyloid angiopathy. However, an atypical appearance of chronic hypertensive microangiopathy is also possible. Unchanged from the prior MRI of 09/05/2013, there is signal abnormality within the intracranial left vertebral artery suggestive of high-grade stenosis or vessel occlusion at this site. Redemonstrated left phthisis bulbi. Electronically Signed   By: Kellie Simmering DO   On: 06/16/2020 15:21   NM Pulmonary Perfusion  Result Date: 06/16/2020 CLINICAL DATA:  Appy at pain EXAM: NUCLEAR MEDICINE PERFUSION LUNG SCAN TECHNIQUE: Perfusion images were obtained in multiple projections after intravenous injection of radiopharmaceutical. Views: Anterior, posterior, left lateral, right lateral, RPO, LPO, RAO, LAO RADIOPHARMACEUTICALS:  3.0 mCi Tc-19mMAA IV COMPARISON:  Chest radiograph Jun 15, 2020 FINDINGS: The distribution of radiotracer uptake bilaterally is homogeneous and symmetric. No focal perfusion defects are appreciable. IMPRESSION: No appreciable perfusion defects. There are no findings which are felt to be indicative of pulmonary embolus. Electronically Signed   By: WLowella GripIII M.D.   On: 06/16/2020 16:53   DG Abd Portable 1V-Small Bowel Obstruction Protocol-initial, 8 hr delay  Result Date: 06/17/2020 CLINICAL  DATA:  SBO 8 hour delayed film EXAM: PORTABLE ABDOMEN - 1 VIEW COMPARISON:  Jun 16, 2020 FINDINGS: Enteric tube with tip and side port overlying the stomach. Dilated gas-filled loops of small bowel are again seen throughout the abdomen. Radiopaque enteric contrast visualized in the left upper quadrant likely within the stomach. Large calcified splenic lesion again visualized. IMPRESSION: Enteric tube with tip and side port overlying the stomach. Radiopaque enteric contrast  visualized in the left upper quadrant likely within the stomach with dilated gas-filled loops of small and large bowel again seen throughout the abdomen. Electronically Signed   By: Dahlia Bailiff MD   On: 06/17/2020 02:16   DG Abd Portable 1V-Small Bowel Protocol-Position Verification  Result Date: 06/16/2020 CLINICAL DATA:  Status post nasogastric tube placement. EXAM: PORTABLE ABDOMEN - 1 VIEW COMPARISON:  CT abdomen and pelvis dated 06/15/2020 FINDINGS: Enteric tube has been placed and appears adequately positioned in the stomach. Gas-filled dilated small bowel loops are again appreciated throughout the abdomen, better demonstrated on yesterday's CT, compatible with obstruction versus ileus. IMPRESSION: 1. Enteric tube appears adequately positioned in the stomach. 2. Continued evidence of small bowel obstruction versus ileus. Electronically Signed   By: Franki Cabot M.D.   On: 06/16/2020 11:08   Marzetta Board, MD, PhD [facility]  Between 7 am - 7 pm I am available, please contact me via Amion (for emergencies) or Securechat (non urgent messages)  Between 7 pm - 7 am I am not available, please contact night coverage MD/APP via Amion

## 2020-06-17 NOTE — Progress Notes (Signed)
Daily Progress Note   Patient Name: [name]       Date: 06/17/2020 DOB: 02/12/1940  Age: 80 y.o. MRN#: [id number] Attending Physician: Leatha GildingGherghe, Costin M, MD Primary Care Physician: Nadara EatonPiazza, Michael J, MD Admit Date: 06/15/2020  Reason for Consultation/Follow-up: Establishing goals of care  Subjective: Chart review performed.  Patient found to have A. fib with RVR and was subsequently started on amiodarone -she is now currently in sinus rhythm.  Patient also found to have a progressive chronic microhemorrhages in the brain -now holding anticoagulation due to high risk of brain bleed. Received report from primary RN -no acute concerns.  Patient remains n.p.o. with NG tube in place, per RN plan for repeat x-ray at 5 PM today.  Went to visit patient at bedside -no family/visitors present.  Patient was lying in bed asleep -she does wake to voice/gentle touch.  She is more alert and responsive today compared to yesterday.  Her speech is slurred and at most times unintelligible.  She is able to answer yes/no questions which per family is her baseline.  She denies having pain. No signs or non-verbal gestures of pain or discomfort noted. No respiratory distress, increased work of breathing, or secretions noted.  She is on 3 L acute O2 nasal cannula.  Called daughter/Channel to offer support, continued GOC discussions, and review updates.  Patient's interval history was reviewed since yesterday's meeting -reviewed use of amiodarone, hypotension was resolved with IV fluids, as well as finding of microhemorrhages.  Reviewed that medical team is still very concerned for her overall prognosis and that her recovery road ahead is still very long and uncertain. Channel states "what ever God's will is she will  accept."  Therapeutic listening provided as she reflects on quality of life and what that would mean to the patient - that keeping patient alive by artificial means would not be "living."  Validation provided that is important to keep the patient's quality of life and goals at the center of decision-making. Emotional support provided. Channel would like to continue current medical treatment with watchful waiting but is still open to discussions around transition to full comfort care if patient declines.  All questions and concerns addressed. Encouraged to call with questions and/or concerns. PMT card previously provided.    Length of Stay: 1  Current  Medications: Scheduled Meds:  . [START ON 06/18/2020] enoxaparin (LOVENOX) injection  30 mg Subcutaneous Q24H  . sorbitol, milk of mag, mineral oil, glycerin (SMOG) enema  960 mL Rectal Once    Continuous Infusions: . amiodarone 30 mg/hr (06/17/20 0800)  . ampicillin-sulbactam (UNASYN) IV 3 g (06/17/20 0053)  . lactated ringers with kcl 50 mL/hr at 06/17/20 0048    PRN Meds: acetaminophen **OR** acetaminophen, albuterol  Physical Exam Vitals and nursing note reviewed.  Constitutional:      General: She is not in acute distress.    Appearance: She is cachectic. She is ill-appearing.  Pulmonary:     Effort: No respiratory distress.  Skin:    General: Skin is warm and dry.  Neurological:     Mental Status: She is alert. Mental status is at baseline.     Motor: Weakness present.  Psychiatric:        Attention and Perception: Attention normal.        Speech: Speech is slurred.        Behavior: Behavior is cooperative.        Cognition and Memory: Cognition is impaired. Memory is impaired.             Vital Signs: BP 93/66 (BP Location: Right Arm)   Pulse 97   Temp 97.9 F (36.6 C) (Axillary)   Resp 14   Ht 5\' 2"  (1.575 m)   Wt 73.9 kg   SpO2 96%   BMI 29.80 kg/m  SpO2: SpO2: 96 % O2 Device: O2 Device: Nasal Cannula O2  Flow Rate: O2 Flow Rate (L/min): 3 L/min  Intake/output summary:   Intake/Output Summary (Last 24 hours) at 06/17/2020 1150 Last data filed at 06/17/2020 0800 Gross per 24 hour  Intake 4933.67 ml  Output 550 ml  Net 4383.67 ml   LBM:   Baseline Weight: Weight: 77.6 kg Most recent weight: Weight: 73.9 kg       Palliative Assessment/Data: PPS 10% due to n.p.o. status    Flowsheet Rows   Flowsheet Row Most Recent Value  Intake Tab   Referral Department Hospitalist  Unit at Time of Referral ER  Palliative Care Primary Diagnosis Neurology  Date Notified 06/15/20  Palliative Care Type New Palliative care  Reason for referral Clarify Goals of Care  Date of Admission 06/15/20  Date first seen by Palliative Care 06/16/20  # of days Palliative referral response time 1 Day(s)  # of days IP prior to Palliative referral 0  Clinical Assessment   Psychosocial & Spiritual Assessment   Palliative Care Outcomes   Patient/Family meeting held? Yes  Who was at the meeting? daughter, sister  Palliative Care Outcomes Clarified goals of care, Counseled regarding hospice, Provided psychosocial or spiritual support  Patient/Family wishes: Interventions discontinued/not started  Transfer out of ICU, Trach, Mechanical Ventilation      Patient Active Problem List   Diagnosis Date Noted  . SBO (small bowel obstruction) (HCC) 06/16/2020  . Dementia (HCC) 06/16/2020  . Acute metabolic encephalopathy 06/16/2020  . Aspiration pneumonia (HCC) 06/16/2020  . Right leg DVT (HCC) 06/16/2020  . DNR (do not resuscitate) 06/16/2020  . Hypomagnesemia   . Colon distention   . Fecal impaction (HCC)   . Acute hypoxemic respiratory failure (HCC)   . Pressure injury of skin 02/04/2018  . Difficult intravenous access   . Constipation 01/27/2018  . Dementia due to Parkinson's disease with behavioral disturbance (HCC) 01/27/2018  . Hypokalemia 01/27/2018  . AKI (acute kidney  injury) (HCC) 01/27/2018  .  Leukocytosis 01/27/2018  . Acute respiratory failure with hypoxia (HCC) 01/27/2018  . Hyperbilirubinemia 01/27/2018  . Abdominal distention 01/27/2018  . Lower extremity weakness 09/03/2013  . Ambulatory dysfunction 09/03/2013  . Parkinson disease (HCC) 09/03/2013  . SVT (supraventricular tachycardia) (HCC) 03/23/2012  . Preop cardiovascular exam 07/03/2011  . Chest pain 04/26/2011  . Paroxysmal atrial fibrillation (HCC)   . Diastolic CHF (HCC)   . EDEMA 11/02/2008  . IMPAIRED GLUCOSE TOLERANCE 10/15/2008  . HYPERCHOLESTEROLEMIA 10/15/2008  . OBESITY 10/15/2008  . ANXIETY DISORDER 10/15/2008  . Essential hypertension 10/15/2008  . Coronary atherosclerosis 10/15/2008  . GASTRITIS 10/15/2008  . OSTEOARTHRITIS 10/15/2008    Palliative Care Assessment & Plan   Patient Profile: 80 y.o. female  with past medical history of stroke, Parkinson's disease, CHF, A. fib, bowel obstruction presented to [facility] ED [facility] 06/15/2020 from [facility] with AMS.  Staff noted patient was unable to swallow and also with right-sided facial droop.  Head CT was unremarkable.  CT chest/abdomen/pelvis was concerning for obstruction as well as aspiration pneumonia.  General surgery was consulted and recommended placement of NG tube for gastric decompression.  Patient was admitted on 06/15/2020 with acute metabolic encephalopathy, DVT of right leg, AKI, acute small bowel obstruction, and acute respiratory failure with hypoxia secondary to aspiration pneumonia.   Assessment: Small bowel obstruction Severe sepsis Aspiration pneumonia Atrial fibrillation with RVR Hypokalemia DVT Acute hypoxic respiratory failure Chronic microhemorrhages within the supratentorial brain Chronic diastolic CHF Acute kidney injury   Recommendations/Plan: Continue current medical interventions with watchful waiting Continue DNR/DNI as previously documented - durable DNR form found in shadow chart, copy was made and will be scanned  into Vynca/ACP tab Family will continue to make stepwise decisions based on information/clinical updates on patient's condition If patient declines, family are open to continued conversations about transition to full comfort care Ongoing PMT discussions pending clinical course PMT will continue to follow and support holistically  Goals of Care and Additional Recommendations: Limitations on Scope of Treatment: Full Scope Treatment, No Artificial Feeding and No Tracheostomy  Code Status:    Code Status Orders  (From admission, onward)         Start     Ordered   06/16/20 1152  Do not attempt resuscitation (DNR)  Continuous       Question Answer Comment  In the event of cardiac or respiratory ARREST Do not call a "code blue"   In the event of cardiac or respiratory ARREST Do not perform Intubation, CPR, defibrillation or ACLS   In the event of cardiac or respiratory ARREST Use medication by any route, position, wound care, and other measures to relive pain and suffering. May use oxygen, suction and manual treatment of airway obstruction as needed for comfort.      06/16/20 1153        Code Status History    Date Active Date Inactive Code Status Order ID Comments User Context   01/27/2018 1654 02/13/2018 1442 DNR [id number]  Merlene Laughter, DO ED   01/27/2018 1458 01/27/2018 1654 DNR [id number]  Linwood Dibbles, MD ED   09/03/2013 1550 09/06/2013 2005 Full Code [id number]  Edsel Petrin, DO Inpatient   Advance Care Planning Activity    Advance Directive Documentation   Flowsheet Row Most Recent Value  Type of Advance Directive Out of facility DNR (pink MOST or yellow form)  Pre-existing out of facility DNR order (yellow form or pink MOST form) Pink  MOST/Yellow Form most recent copy in chart - Physician notified to receive inpatient order  "MOST" Form in Place? --      Prognosis:  Unable to determine  Discharge Planning: To Be Determined  Care plan was discussed with primary  RN, patient's daughter  Thank you for allowing the Palliative Medicine Team to assist in the care of this patient.   Total Time  30 minutes Prolonged Time Billed  no       Greater than 50%  of this time was spent counseling and coordinating care related to the above assessment and plan.  Haskel Khan, NP  Please contact Palliative Medicine Team phone at [phone] for questions and concerns.   *Portions of this note are a verbal dictation therefore any spelling and/or grammatical errors are due to the "[employer]agon [employer] One" system interpretation.

## 2020-06-18 ENCOUNTER — Inpatient Hospital Stay (HOSPITAL_COMMUNITY): Payer: Medicare Other

## 2020-06-18 DIAGNOSIS — K56609 Unspecified intestinal obstruction, unspecified as to partial versus complete obstruction: Secondary | ICD-10-CM

## 2020-06-18 LAB — CBC
HCT: 42.4 % (ref 36.0–46.0)
Hemoglobin: 13.3 g/dL (ref 12.0–15.0)
MCH: 26.7 pg (ref 26.0–34.0)
MCHC: 31.4 g/dL (ref 30.0–36.0)
MCV: 85 fL (ref 80.0–100.0)
Platelets: 134 10*3/uL — ABNORMAL LOW (ref 150–400)
RBC: 4.99 MIL/uL (ref 3.87–5.11)
RDW: 16 % — ABNORMAL HIGH (ref 11.5–15.5)
WBC: 13.6 10*3/uL — ABNORMAL HIGH (ref 4.0–10.5)
nRBC: 0 % (ref 0.0–0.2)

## 2020-06-18 LAB — COMPREHENSIVE METABOLIC PANEL
ALT: 14 U/L (ref 0–44)
AST: 20 U/L (ref 15–41)
Albumin: 2.4 g/dL — ABNORMAL LOW (ref 3.5–5.0)
Alkaline Phosphatase: 70 U/L (ref 38–126)
Anion gap: 10 (ref 5–15)
BUN: 27 mg/dL — ABNORMAL HIGH (ref 8–23)
CO2: 19 mmol/L — ABNORMAL LOW (ref 22–32)
Calcium: 8.2 mg/dL — ABNORMAL LOW (ref 8.9–10.3)
Chloride: 118 mmol/L — ABNORMAL HIGH (ref 98–111)
Creatinine, Ser: 1.03 mg/dL — ABNORMAL HIGH (ref 0.44–1.00)
GFR, Estimated: 54 mL/min — ABNORMAL LOW (ref >60)
Glucose, Bld: 88 mg/dL (ref 70–99)
Potassium: 3.6 mmol/L (ref 3.5–5.1)
Sodium: 147 mmol/L — ABNORMAL HIGH (ref 135–145)
Total Bilirubin: 0.8 mg/dL (ref 0.3–1.2)
Total Protein: 5.6 g/dL — ABNORMAL LOW (ref 6.5–8.1)

## 2020-06-18 LAB — PHOSPHORUS: Phosphorus: 1.7 mg/dL — ABNORMAL LOW (ref 2.5–4.6)

## 2020-06-18 LAB — MAGNESIUM: Magnesium: 2.5 mg/dL — ABNORMAL HIGH (ref 1.7–2.4)

## 2020-06-18 LAB — MRSA PCR SCREENING: MRSA by PCR: NEGATIVE

## 2020-06-18 MED ORDER — METOPROLOL TARTRATE 5 MG/5ML IV SOLN
2.5000 mg | Freq: Four times a day (QID) | INTRAVENOUS | Status: DC
  Administered 2020-06-18 – 2020-06-21 (×11): 2.5 mg via INTRAVENOUS
  Filled 2020-06-18 (×11): qty 5

## 2020-06-18 MED ORDER — SODIUM CHLORIDE 0.9 % IV SOLN
3.0000 g | Freq: Three times a day (TID) | INTRAVENOUS | Status: DC
  Administered 2020-06-18 – 2020-06-21 (×9): 3 g via INTRAVENOUS
  Filled 2020-06-18 (×6): qty 8
  Filled 2020-06-18: qty 3
  Filled 2020-06-18: qty 8
  Filled 2020-06-18: qty 3
  Filled 2020-06-18: qty 8
  Filled 2020-06-18: qty 3

## 2020-06-18 NOTE — Progress Notes (Signed)
Chaplain responded to spiritual care consult, PC , request for prayer and EOL. Daughter and sister were bedside.  Pt appeared tired, but was responded positively to greeting.  She welcomed prayer and when asked, said chaplain could prayer for ease of pain.  Chaplain prayed and provided support to family, offering hospitality and active listening.    Thank you for this consult.  Please contact if further support would be helpful.    Theodoro Parma [phone]    06/18/20 1400  Clinical Encounter Type  Visited With Patient and family together  Visit Type Initial;Spiritual support  Referral From Patient  Consult/Referral To Chaplain  Spiritual Encounters  Spiritual Needs Prayer

## 2020-06-18 NOTE — Progress Notes (Signed)
   Progress Note  Patient Name: [name] Date of Encounter: 06/18/2020  Primary Cardiologist: Jodelle Red, MD  Hospital course reviewed since rounding note yesterday.  [name] remains in sinus rhythm by telemetry which I personally reviewed, lead artifact noted.  She remains on IV amiodarone with NG tube in place for SBO and n.p.o. status.  Echocardiogram from May 14 showed LVEF 60 to 65% with small pericardial effusion.  Pertinent lab work includes potassium 3.8, BUN 35, creatinine 1.28 down from 1.78, AST 21, ALT 14, WBC 13.9 down from 26.6, hemoglobin 12.9, platelets 146.  No change in plan from cardiac perspective at this time.  Signed, Nona Dell, MD  06/18/2020, 8:27 AM

## 2020-06-18 NOTE — Progress Notes (Signed)
PROGRESS NOTE  [name]:[id number] DOB: 1940-02-25 DOA: 06/15/2020 PCP: Javier Glazier, MD   LOS: 2 days   Brief Narrative / Interim history: 80 year old female with HTN, PAF, diastolic CHF last EF 03-49%, prior CVA with residual right-sided weakness, dementia comes in from her nursing facility being acutely altered.  At baseline she is able to recognize family members, answering yes and no questions but over the last couple of days she has been poorly responsive and has had several episodes of vomiting.  She has been more lethargic.  She was brought to the hospital and ED she was found to be hypotensive, also CT of the abdomen and pelvis showed multiple distended loops of bowel concerning for small bowel obstruction.  Imaging also was concerning for multifocal pneumonia.  General surgery was consulted, she had an NG tube placed and she was placed on antibiotic for aspiration pneumonia.  She was found to have A. fib with RVR with wide-complex tachycardia and cardiology was consulted and she was placed on amiodarone drip.  Given poor baseline and multiple medical problems palliative care was consulted as well  Subjective / 24h Interval events: Seems alert, mumbling, underlying dementia, does not answer directed questions  Assessment & Plan: Principal Problem Small bowel obstruction -conservative management for now, appreciate general surgery follow-up.  Continue NG tube, IV fluids and conservative care -Not really a good candidate for surgery.  Management per general surgery  Active Problems Severe sepsis due to aspiration pneumonia-met criteria with hypotension, tachycardia, as well as elevated lactic acid.  She was fluid resuscitated and blood pressure has improved. -Continue fluids while n.p.o. -Continue Unasyn, today day #3, procalcitonin was elevated  PAF, A. fib with RVR -patient has a history of paroxysmal A. fib developed RVR in the ED in the setting of pneumonia,  dehydration.  Cardiology consulted -Continue amiodarone, 2D echo done shows EF 60 to 65% -Not anticoagulated due to concern for high risk of brain bleed  Hypokalemia-due to losses, continue to replete  Hypernatremia-mild, due to n.p.o., hopefully will advance diet soon.  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  Hold anticoagulation due to high risk of brain bleed.  Discussed this with daughter at bedside yesterday, she is in agreement  Acute hypoxic respiratory failure -multifactorial in the setting of aspiration pneumonia, small right-sided effusion.  Provide supplemental oxygen, monitor closely fluid status  Chronic microhemorrhages within the supratentorial brain -progressive, discussed with neurology over the phone, given concern for amyloid there is a fairly decent chance of bleed.  Hold anticoagulation, discussed with the daughter at bedside on 1/79  Chronic diastolic CHF -slight lower extremity edema, suspect chronic.  Monitor fluid status closely.  BNP elevated at 274  Acute kidney injury -likely in the setting of sepsis, dehydration due to vomiting secondary to bowel obstruction.  With fluids her creatinine is improving  Coronary artery disease -no chest pain, stable.  Negative high-sensitivity troponin  Goals of care-family confirms DNR but want full medical care at this point  Scheduled Meds: . enoxaparin (LOVENOX) injection  30 mg Subcutaneous Q24H   Continuous Infusions: . amiodarone 30 mg/hr (06/18/20 0551)  . ampicillin-sulbactam (UNASYN) IV    . lactated ringers with kcl 50 mL/hr at 06/17/20 2124   PRN Meds:.acetaminophen **OR** acetaminophen, albuterol  DVT prophylaxis: enoxaparin (LOVENOX) injection 30 mg Start: 06/18/20 0900  Code Status: DNR  Family Communication: No family at bedside this morning,  will discuss later  Status is: Inpatient  Remains inpatient appropriate because:Inpatient level of care appropriate due to severity of illness   Dispo: The patient is from: SNF              Anticipated d/c is to: SNF              Patient currently is not medically stable to d/c.   Difficult to place patient No  Level of care: Progressive  Consultants:  General surgery Cardiology Critical care Palliative care  Procedures:  2D echo: Pending  Microbiology  none  Antimicrobials: Unasyn 5/13 >>    Objective: Vitals:   06/17/20 1940 06/17/20 2300 06/18/20 0407 06/18/20 0722  BP: 129/87 117/69 122/78 132/71  Pulse: 79 76 74 73  Resp: _0 Temp: 97.9 F (36.6 C) 97.8 F (36.6 C) 98.4 F (36.9 C) 98 F (36.7 C)  TempSrc: Axillary Axillary Axillary Oral  SpO2: 96% 90% 90% 91%  Weight:   73.9 kg   Height:        Intake/Output Summary (Last 24 hours) at 06/18/2020 1030 Last data filed at 06/18/2020 0407 Gross per 24 hour  Intake 1530.02 ml  Output 50 ml  Net 1480.02 ml   Filed Weights   06/15/20 1402 06/17/20 0500 06/18/20 0407  Weight: 77.6 kg 73.9 kg 73.9 kg    Examination:  Constitutional: NAD, in bed Eyes: No icterus ENMT: mmm Neck: normal, supple Respiratory: Clear bilaterally, no wheezing, no crackles, normal effort Cardiovascular: Irregular, no murmurs, 1+ edema Abdomen: Nondistended, nontender, bowel sounds positive Musculoskeletal: no clubbing / cyanosis.  Skin: No rashes seen Neurologic: Does not follow commands consistently but appears nonfocal spontaneously moves extremities  Data Reviewed: I have independently reviewed following labs and imaging studies   CBC: Recent Labs  Lab 06/15/20 1410 06/16/20 2145 06/17/20 0040 06/17/20 0434 06/17/20 1731 06/18/20 0806  WBC 8.5 11.1* 10.7* 26.6* 13.9* 13.6*  NEUTROABS 6.2  --   --   --   --   --   HGB 16.2* 13.4 13.3 14.2 12.9 13.3  HCT 52.0* 42.2 42.1 50.0* 41.6 42.4  MCV 84.8 83.2  83.4 91.9 84.6 85.0  PLT 253 128* 145* 160 146* 188*   Basic Metabolic Panel: Recent Labs  Lab 06/15/20 1656 06/16/20 1836 06/16/20 2312 06/17/20 1731 06/18/20 0806  NA 143 146* 144 146* 147*  K 4.2 <2.0* 3.4* 3.8 3.6  CL 105 >130* 113* 114* 118*  CO2 26 11* 21* 22 19*  GLUCOSE 132* 59* 109* 80 88  BUN 55* 31* 49* 35* 27*  CREATININE 2.15* 0.89 1.78* 1.28* 1.03*  CALCIUM 8.9 <4.0* 7.4* 7.9* 8.2*  MG  --   --  1.9  --  2.5*  PHOS  --   --   --   --  1.7*   Liver Function Tests: Recent Labs  Lab 06/15/20 1656 06/16/20 1836 06/16/20 2312 06/17/20 1731 06/18/20 0806  AST 19 14* _1 ALT _2 ALKPHOS 81 24* 59 66 70  BILITOT 1.8* 0.5 1.2 1.0 0.8  PROT 6.4* <3.0* 5.4* 5.6* 5.6*  ALBUMIN 3.4* <1.0* 2.5* 2.5* 2.4*   Coagulation Profile: No results for input(s): INR, PROTIME in the last 168 hours. HbA1C: No results for input(s): HGBA1C in the last 72 hours. CBG: Recent Labs  Lab 06/15/20 1920  GLUCAP 150*  Recent Results (from the past 240 hour(s))  Urine culture     Status: Abnormal   Collection Time: 06/15/20  2:32 PM   Specimen: Urine, Random  Result Value Ref Range Status   Specimen Description URINE, RANDOM  Final   Special Requests   Final    NONE Performed at [facility], [address], [place], [place] [address]    Culture MULTIPLE SPECIES PRESENT, SUGGEST RECOLLECTION (A)  Final   Report Status 06/17/2020 FINAL  Final  SARS CORONAVIRUS 2 (TAT 6-24 HRS) Nasopharyngeal Urine, Clean Catch     Status: None   Collection Time: 06/15/20  7:24 PM   Specimen: Urine, Clean Catch; Nasopharyngeal  Result Value Ref Range Status   SARS Coronavirus 2 NEGATIVE NEGATIVE Final    Comment: (NOTE) SARS-CoV-2 target nucleic acids are NOT DETECTED.  The SARS-CoV-2 RNA is generally detectable in upper and lower respiratory specimens during the acute phase of infection. Negative results do not preclude SARS-CoV-2 infection, do not rule  out co-infections with other pathogens, and should not be used as the sole basis for treatment or other patient management decisions. Negative results must be combined with clinical observations, patient history, and epidemiological information. The expected result is Negative.  Fact Sheet for Patients: SugarRoll.be  Fact Sheet for Healthcare Providers: https://www.woods-mathews.com/  This test is not yet approved or cleared by the [place] FDA and  has been authorized for detection and/or diagnosis of SARS-CoV-2 by FDA under an Emergency Use Authorization (EUA). This EUA will remain  in effect (meaning this test can be used) for the duration of the COVID-19 declaration under Se ction 564(b)(1) of the Act, 21 U.S.C. section 360bbb-3(b)(1), unless the authorization is terminated or revoked sooner.  Performed at [facility], [place] [address] [place], [place] [address]      Radiology Studies: DG Abd Portable 1V  Result Date: 06/17/2020 CLINICAL DATA:  Small bowel obstruction EXAM: PORTABLE ABDOMEN - 1 VIEW COMPARISON:  06/17/2020, 1:29 a.m. FINDINGS: Redemonstrated diffusely distended small bowel, with gas-filled, nondistended colon and scattered gas and stool present to the rectum. Large stool balls are again noted in the rectum. No obvious free air on supine radiographs. Esophagogastric tube is position with tip just below the diaphragm, side port at least 5 cm above the diaphragm. IMPRESSION: Redemonstrated diffusely distended small bowel, with gas-filled, nondistended colon and scattered gas and stool present to the rectum. Large stool balls are again noted in the rectum. No obvious free air on supine radiographs. Electronically Signed   By: Eddie Candle M.D.   On: 06/17/2020 17:28   ECHOCARDIOGRAM LIMITED  Result Date: 06/17/2020    ECHOCARDIOGRAM LIMITED REPORT   Patient Name:   TYLESHA GIBEAULT Date of Exam: 06/17/2020 Medical Rec  #:  [id number]        Height:       62.0 in Accession #:    [id number]       Weight:       162.9 lb Date of Birth:  08-20-1940         BSA:          1.752 m Patient Age:    36 years         BP:           99/69 mmHg Patient Gender: F                HR:  73 bpm. Exam Location:  Inpatient Procedure: Limited Echo and Intracardiac Opacification Agent Indications:     R94.31 Abnormal EKG  History:         Patient has prior history of Echocardiogram examinations, most                  recent 02/03/2018. Stroke, Arrythmias:Atrial Fibrillation;                  Signs/Symptoms:Altered Mental Status. Parkinson's. Bowel                  Obstruction.  Sonographer:     Merrie Roof RDCS Referring Phys:  [id number] [place] Diagnosing Phys: Oswaldo Milian MD  Sonographer Comments: A limited echo was ordered. Very technically difficult due to altered mental status and poor windows IMPRESSIONS  1. Techincally difficult study, very limited views  2. Left ventricular ejection fraction, by estimation, is 60 to 65%. The left ventricle has normal function. Left ventricular endocardial border not optimally defined to evaluate regional wall motion.  3. Right ventricular systolic function was not well visualized. The right ventricular size is not well visualized.  4. A small pericardial effusion is present FINDINGS  Left Ventricle: Left ventricular ejection fraction, by estimation, is 60 to 65%. The left ventricle has normal function. Left ventricular endocardial border not optimally defined to evaluate regional wall motion. Definity contrast agent was given IV to delineate the left ventricular endocardial borders. Right Ventricle: The right ventricular size is not well visualized. Right ventricular systolic function was not well visualized. Pericardium: A small pericardial effusion is present. Aortic Valve: The aortic valve was not well visualized. Aortic valve regurgitation is not visualized. Oswaldo Milian MD  Electronically signed by Oswaldo Milian MD Signature Date/Time: 06/17/2020/2:43:55 PM    Final (Updated)    Marzetta Board, MD, PhD [facility]  Between 7 am - 7 pm I am available, please contact me via Amion (for emergencies) or Securechat (non urgent messages)  Between 7 pm - 7 am I am not available, please contact night coverage MD/APP via Amion

## 2020-06-18 NOTE — Plan of Care (Signed)

## 2020-06-18 NOTE — Progress Notes (Signed)
PHARMACY NOTE:  ANTIMICROBIAL RENAL DOSAGE ADJUSTMENT  Current antimicrobial regimen includes a mismatch between antimicrobial dosage and estimated renal function.  As per policy approved by the Pharmacy & Therapeutics and Medical Executive Committees, the antimicrobial dosage will be adjusted accordingly.  Current antimicrobial dosage:  Unasyn 3 gm IV q12h  Indication: sepsis, aspiration PNA  Renal Function:  Estimated Creatinine Clearance: 39.6 mL/min (A) (by C-G formula based on SCr of 1.03 mg/dL (H)). []      On intermittent HD, scheduled: []      On CRRT    Antimicrobial dosage has been changed to:  Unasyn 3 gm IV q8 hrs  , PharmD, BCPS PGY2 Cardiology Pharmacy Resident Phone: [phone] 06/18/2020  9:48 AM  Please check [email or web address] for unit-specific pharmacy phone numbers.

## 2020-06-18 NOTE — Progress Notes (Signed)
[facility] Progress Note     Subjective: CC:  Pleasantly demented. BMx4 documented last 24h. Patient currently getting a bath after some incontinence of stool. Minimal NG output.   Objective: Vital signs in last 24 hours: Temp:  [97.8 F (36.6 C)-98.5 F (36.9 C)] 98 F (36.7 C) (05/15 0722) Pulse Rate:  [65-97] 73 (05/15 0722) Resp:  [13-17] 17 (05/15 0722) BP: (93-132)/(66-90) 132/71 (05/15 0722) SpO2:  [90 %-97 %] 91 % (05/15 0722) Weight:  [73.9 kg] 73.9 kg (05/15 0407) Last BM Date: 06/17/20  Intake/Output from previous day: 05/14 0701 - 05/15 0700 In: 1863.4 [I.V.:1663.4; IV Piggyback:200] Out: 50 [Emesis/NG output:50] Intake/Output this shift: No intake/output data recorded.  PE: Gen:  NAD, pleasantly demented elderly female in soft restraints Card:  Irregular rhythm, regular rate, pedal pulses 2+ BL Pulm:  Normal effort, clear to auscultation bilaterally Abd: Soft, TTP epiagtrium and LUQ without peritonitis, abdomen is mildly distended with upper abdominal fullness,   NG (60F)- 50-100 cc/24h light brown/clear Skin: warm and dry, no rashes  Psych: A&Ox3   Lab Results:  Recent Labs    06/17/20 0434 06/17/20 1731  WBC 26.6* 13.9*  HGB 14.2 12.9  HCT 50.0* 41.6  PLT 160 146*   BMET Recent Labs    06/16/20 2312 06/17/20 1731  NA 144 146*  K 3.4* 3.8  CL 113* 114*  CO2 21* 22  GLUCOSE 109* 80  BUN 49* 35*  CREATININE 1.78* 1.28*  CALCIUM 7.4* 7.9*   PT/INR No results for input(s): LABPROT, INR in the last 72 hours. CMP     Component Value Date/Time   NA 146 (H) 06/17/2020 1731   K 3.8 06/17/2020 1731   CL 114 (H) 06/17/2020 1731   CO2 22 06/17/2020 1731   GLUCOSE 80 06/17/2020 1731   BUN 35 (H) 06/17/2020 1731   CREATININE 1.28 (H) 06/17/2020 1731   CALCIUM 7.9 (L) 06/17/2020 1731   PROT 5.6 (L) 06/17/2020 1731   ALBUMIN 2.5 (L) 06/17/2020 1731   AST 21 06/17/2020 1731   ALT 14 06/17/2020 1731   ALKPHOS 66 06/17/2020 1731    BILITOT 1.0 06/17/2020 1731   GFRNONAA 42 (L) 06/17/2020 1731   GFRAA >60 02/13/2018 0701   Lipase     Component Value Date/Time   LIPASE 63 (H) 01/27/2018 1110       Studies/Results: MR BRAIN WO CONTRAST  Result Date: 06/16/2020 CLINICAL DATA:  Neuro deficit, acute, stroke suspected. EXAM: MRI HEAD WITHOUT CONTRAST TECHNIQUE: Multiplanar, multiecho pulse sequences of the brain and surrounding structures were obtained without intravenous contrast. COMPARISON:  Prior head CT examinations 06/15/2020 and earlier. Brain MRI 09/05/2013. FINDINGS: Brain: Intermittently motion degraded examination. Most notably, there is mild-to-moderate motion degradation of the axial T2/FLAIR sequence. Moderate cerebral atrophy. Commensurate prominence of the ventricles and sulci. Comparatively mild cerebellar atrophy. Severe cerebral white matter chronic small vessel ischemic disease, progressed from the brain MRI of 09/05/2013. This includes a small chronic lacunar infarct within the left corona radiata/basal ganglia. There are fairly numerous scattered tiny foci of SWI signal loss compatible with chronic microhemorrhages within the supratentorial brain. There is no acute infarct. No evidence of intracranial mass. No extra-axial fluid collection. No midline shift. Partially empty sella turcica. Vascular: Signal abnormality within the intracranial left vertebral artery, severe stenosis or vessel occlusion at this site, unchanged from the brain MRI of 09/05/2013. Flow voids otherwise preserved within the proximal large arterial vessels. Skull and upper cervical spine: No focal marrow  lesion. Incompletely assessed cervical spondylosis. Multilevel in least mild spinal canal stenosis within the visualized upper cervical spine. Sinuses/Orbits: Redemonstrated left phthisis bulbi. Prior right lens replacement. Trace bilateral ethmoid sinus mucosal thickening. IMPRESSION: Mild-to-moderate intermittent motion degradation, as  described. No evidence of acute intracranial abnormality. Moderate cerebral atrophy and severe cerebral white matter chronic small vessel ischemic disease, progressed from the brain MRI of 09/05/2013. Also progressed from this prior exam, there are fairly numerous chronic microhemorrhages within the supratentorial brain, raising the possibility of cerebral amyloid angiopathy. However, an atypical appearance of chronic hypertensive microangiopathy is also possible. Unchanged from the prior MRI of 09/05/2013, there is signal abnormality within the intracranial left vertebral artery suggestive of high-grade stenosis or vessel occlusion at this site. Redemonstrated left phthisis bulbi. Electronically Signed   By: Jackey Loge DO   On: 06/16/2020 15:21   NM Pulmonary Perfusion  Result Date: 06/16/2020 CLINICAL DATA:  Appy at pain EXAM: NUCLEAR MEDICINE PERFUSION LUNG SCAN TECHNIQUE: Perfusion images were obtained in multiple projections after intravenous injection of radiopharmaceutical. Views: Anterior, posterior, left lateral, right lateral, RPO, LPO, RAO, LAO RADIOPHARMACEUTICALS:  3.0 mCi Tc-37m MAA IV COMPARISON:  Chest radiograph Jun 15, 2020 FINDINGS: The distribution of radiotracer uptake bilaterally is homogeneous and symmetric. No focal perfusion defects are appreciable. IMPRESSION: No appreciable perfusion defects. There are no findings which are felt to be indicative of pulmonary embolus. Electronically Signed   By: Bretta Bang III M.D.   On: 06/16/2020 16:53   DG Abd Portable 1V  Result Date: 06/17/2020 CLINICAL DATA:  Small bowel obstruction EXAM: PORTABLE ABDOMEN - 1 VIEW COMPARISON:  06/17/2020, 1:29 a.m. FINDINGS: Redemonstrated diffusely distended small bowel, with gas-filled, nondistended colon and scattered gas and stool present to the rectum. Large stool balls are again noted in the rectum. No obvious free air on supine radiographs. Esophagogastric tube is position with tip just below  the diaphragm, side port at least 5 cm above the diaphragm. IMPRESSION: Redemonstrated diffusely distended small bowel, with gas-filled, nondistended colon and scattered gas and stool present to the rectum. Large stool balls are again noted in the rectum. No obvious free air on supine radiographs. Electronically Signed   By: Lauralyn Primes M.D.   On: 06/17/2020 17:28   DG Abd Portable 1V-Small Bowel Obstruction Protocol-initial, 8 hr delay  Result Date: 06/17/2020 CLINICAL DATA:  SBO 8 hour delayed film EXAM: PORTABLE ABDOMEN - 1 VIEW COMPARISON:  Jun 16, 2020 FINDINGS: Enteric tube with tip and side port overlying the stomach. Dilated gas-filled loops of small bowel are again seen throughout the abdomen. Radiopaque enteric contrast visualized in the left upper quadrant likely within the stomach. Large calcified splenic lesion again visualized. IMPRESSION: Enteric tube with tip and side port overlying the stomach. Radiopaque enteric contrast visualized in the left upper quadrant likely within the stomach with dilated gas-filled loops of small and large bowel again seen throughout the abdomen. Electronically Signed   By: Maudry Mayhew MD   On: 06/17/2020 02:16   DG Abd Portable 1V-Small Bowel Protocol-Position Verification  Result Date: 06/16/2020 CLINICAL DATA:  Status post nasogastric tube placement. EXAM: PORTABLE ABDOMEN - 1 VIEW COMPARISON:  CT abdomen and pelvis dated 06/15/2020 FINDINGS: Enteric tube has been placed and appears adequately positioned in the stomach. Gas-filled dilated small bowel loops are again appreciated throughout the abdomen, better demonstrated on yesterday's CT, compatible with obstruction versus ileus. IMPRESSION: 1. Enteric tube appears adequately positioned in the stomach. 2. Continued evidence of small bowel  obstruction versus ileus. Electronically Signed   By: Bary Richard M.D.   On: 06/16/2020 11:08   VAS [place] LOWER EXTREMITY VENOUS (DVT) (ONLY [facility] & [facility])  Result Date:  06/16/2020  Lower Venous DVT Study Patient Name:  NIVEA WOJDYLA  Date of Exam:   06/16/2020 Medical Rec #: [id number]         Accession #:    [id number] Date of Birth: [birth year]          Patient Gender: F Patient Age:   80Y Exam Location:  [facility] Procedure:      VAS [place] LOWER EXTREMITY VENOUS (DVT) Referring Phys: [id number] ANKIT NANAVATI --------------------------------------------------------------------------------  Indications: Edema.  Comparison Study: no prior Performing Technologist: Blanch Media RVS  Examination Guidelines: A complete evaluation includes B-mode imaging, spectral Doppler, color Doppler, and power Doppler as needed of all accessible portions of each vessel. Bilateral testing is considered an integral part of a complete examination. Limited examinations for reoccurring indications may be performed as noted. The reflux portion of the exam is performed with the patient in reverse Trendelenburg.  +---------+---------------+---------+-----------+----------+-------------------+ RIGHT    CompressibilityPhasicitySpontaneityPropertiesThrombus Aging      +---------+---------------+---------+-----------+----------+-------------------+ CFV      None           Yes      Yes                  Age Indeterminate   +---------+---------------+---------+-----------+----------+-------------------+ SFJ      None                                         Age Indeterminate   +---------+---------------+---------+-----------+----------+-------------------+ FV Prox  None                                         Age Indeterminate   +---------+---------------+---------+-----------+----------+-------------------+ FV Mid   Partial                                      Age Indeterminate   +---------+---------------+---------+-----------+----------+-------------------+ FV DistalPartial                                      Age Indeterminate    +---------+---------------+---------+-----------+----------+-------------------+ PFV      Partial                                      Age Indeterminate   +---------+---------------+---------+-----------+----------+-------------------+ POP      Partial        Yes      Yes                  Age Indeterminate   +---------+---------------+---------+-----------+----------+-------------------+ PTV      Partial                                      Age Indeterminate   +---------+---------------+---------+-----------+----------+-------------------+ PERO  Not well visualized +---------+---------------+---------+-----------+----------+-------------------+   +---------+---------------+---------+-----------+----------+--------------+ LEFT     CompressibilityPhasicitySpontaneityPropertiesThrombus Aging +---------+---------------+---------+-----------+----------+--------------+ CFV      Full           Yes      Yes                                 +---------+---------------+---------+-----------+----------+--------------+ SFJ      Full                                                        +---------+---------------+---------+-----------+----------+--------------+ FV Prox  Full                                                        +---------+---------------+---------+-----------+----------+--------------+ FV Mid   Full                                                        +---------+---------------+---------+-----------+----------+--------------+ FV DistalFull                                                        +---------+---------------+---------+-----------+----------+--------------+ PFV      Full                                                        +---------+---------------+---------+-----------+----------+--------------+ POP      Full           Yes      Yes                                  +---------+---------------+---------+-----------+----------+--------------+ PTV      Full                                                        +---------+---------------+---------+-----------+----------+--------------+ PERO     Full                                                        +---------+---------------+---------+-----------+----------+--------------+     Summary: RIGHT: - Findings consistent with age indeterminate deep vein thrombosis involving the right common femoral vein, SF junction, right femoral vein, right proximal profunda vein, right popliteal vein, and right posterior tibial veins. - No  cystic structure found in the popliteal fossa.  LEFT: - There is no evidence of deep vein thrombosis in the lower extremity.  - No cystic structure found in the popliteal fossa.  *See table(s) above for measurements and observations. Electronically signed by Lemar Livings MD on 06/16/2020 at 4:58:05 PM.    Final    ECHOCARDIOGRAM LIMITED  Result Date: 06/17/2020    ECHOCARDIOGRAM LIMITED REPORT   Patient Name:   [name] Date of Exam: 06/17/2020 Medical Rec #:  [id number]        Height:       62.0 in Accession #:    [id number]       Weight:       162.9 lb Date of Birth:  [birth year]         BSA:          1.752 m Patient Age:    80 years         BP:           99/69 mmHg Patient Gender: F                HR:           73 bpm. Exam Location:  Inpatient Procedure: Limited Echo and Intracardiac Opacification Agent Indications:     R94.31 Abnormal EKG  History:         Patient has prior history of Echocardiogram examinations, most                  recent 02/03/2018. Stroke, Arrythmias:Atrial Fibrillation;                  Signs/Symptoms:Altered Mental Status. Parkinson's. Bowel                  Obstruction.  Sonographer:     Roosvelt Maser RDCS Referring Phys:  [id number] [facility] A SMITH Diagnosing Phys: Epifanio Lesches MD  Sonographer Comments: A limited echo was ordered. Very technically difficult  due to altered mental status and poor windows IMPRESSIONS  1. Techincally difficult study, very limited views  2. Left ventricular ejection fraction, by estimation, is 60 to 65%. The left ventricle has normal function. Left ventricular endocardial border not optimally defined to evaluate regional wall motion.  3. Right ventricular systolic function was not well visualized. The right ventricular size is not well visualized.  4. A small pericardial effusion is present FINDINGS  Left Ventricle: Left ventricular ejection fraction, by estimation, is 60 to 65%. The left ventricle has normal function. Left ventricular endocardial border not optimally defined to evaluate regional wall motion. Definity contrast agent was given IV to delineate the left ventricular endocardial borders. Right Ventricle: The right ventricular size is not well visualized. Right ventricular systolic function was not well visualized. Pericardium: A small pericardial effusion is present. Aortic Valve: The aortic valve was not well visualized. Aortic valve regurgitation is not visualized. Epifanio Lesches MD Electronically signed by Epifanio Lesches MD Signature Date/Time: 06/17/2020/2:43:55 PM    Final (Updated)     Anti-infectives: Anti-infectives (From admission, onward)   Start     Dose/Rate Route Frequency Ordered Stop   06/16/20 1200  Ampicillin-Sulbactam (UNASYN) 3 g in sodium chloride 0.9 % 100 mL IVPB        3 g 200 mL/hr over 30 Minutes Intravenous Every 12 hours 06/16/20 1153     06/15/20 2100  Ampicillin-Sulbactam (UNASYN) 3 g in sodium chloride 0.9 % 100 mL IVPB  3 g 200 mL/hr over 30 Minutes Intravenous  Once 06/15/20 2009 06/15/20 2204   06/15/20 2100  vancomycin (VANCOREADY) IVPB 1000 mg/200 mL        1,000 mg 200 mL/hr over 60 Minutes Intravenous  Once 06/15/20 2009 06/16/20 0023      Assessment/Plan hypertension paroxysmal atrial fibrillation 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  - NGT to LIWS  - SB protocol, KUB yesterday with persistently dilated small bowel and large stool balls in the rectum, having BMs now.  - no emergent surgical needs. - given age, debility, and co-morbidities patients perioperative morbidity/mortality risk would be high. - AM KUB pending. Continue NGT for now.  FEN: NPO, NGT LIWS, IVF ID: [email or web address] VTE: SCD's, therapeutic lovenox for DVT Foley: external cath Code status: DNR/DNI     LOS: 2 days    Hosie Spangle, [facility] Please see Amion for pager number during day hours 7:00am-4:30pm

## 2020-06-18 NOTE — Progress Notes (Signed)
Daily Progress Note   Patient Name: [name]       Date: 06/18/2020 DOB: [birth year]  Age: 80 y.o. MRN#: [id number] Attending Physician: Leatha Gilding, MD Primary Care Physician: Nadara Eaton, MD Admit Date: 06/15/2020  Reason for Consultation/Follow-up: Establishing goals of care  Subjective: Chart reviewed. Received update from bedside RN - patient with frequent bowel movements yesterday and today. She is more awake today compared to yesterday. She has been intermittently agitated.  I went to visit patient at bedside - no family/visitors present. NGT to low intermittent suction. She remains on a continuous amiodarone infusion. She is requiring bilateral soft mittens. She is pleasantly confused - her speech is mostly unintelligible.   I spoke with daughter Channel by phone. Discussed that patient's clinical condition has improved since admission. Channel states she is grateful that her mother is making progress. Discussed that goal of care is for her to return to SNF when medically stable. Introduced MOST form and asked Channel if she could meet tomorrow at bedside to complete this form - she is agreeable and we plan to meet around noon.   Length of Stay: 2  Current Medications: Scheduled Meds:  . enoxaparin (LOVENOX) injection  30 mg Subcutaneous Q24H  . metoprolol tartrate  2.5 mg Intravenous Q6H    Continuous Infusions: . amiodarone 30 mg/hr (06/18/20 1200)  . ampicillin-sulbactam (UNASYN) IV 3 g (06/18/20 1059)  . lactated ringers with kcl 50 mL/hr at 06/18/20 1200    PRN Meds: acetaminophen **OR** acetaminophen, albuterol  Physical Exam Vitals reviewed.  Constitutional:      General: She is not in acute distress.    Appearance: She is ill-appearing.   Cardiovascular:     Rate and Rhythm: Normal rate.  Pulmonary:     Effort: Pulmonary effort is normal.  Abdominal:     Comments: NGT to low intermittent suction  Neurological:     Mental Status: She is alert. She is confused.  Psychiatric:        Speech: Speech is slurred.        Cognition and Memory: Cognition is impaired.             Vital Signs: BP 101/75 (BP Location: Right Arm)   Pulse (!) 190   Temp 98.9 F (37.2 C) (Oral)  Resp 17   Ht 5\' 2"  (1.575 m)   Wt 73.9 kg   SpO2 91%   BMI 29.80 kg/m  SpO2: SpO2: 91 % O2 Device: O2 Device: Room Air  Intake/output summary:   Intake/Output Summary (Last 24 hours) at 06/18/2020 1724 Last data filed at 06/18/2020 1200 Gross per 24 hour  Intake 1420.21 ml  Output 0 ml  Net 1420.21 ml   LBM: Last BM Date: 06/18/20 Baseline Weight: Weight: 77.6 kg Most recent weight: Weight: 73.9 kg       Palliative Assessment/Data: PPS 10% (currently NPO)     Patient Active Problem List   Diagnosis Date Noted  . SBO (small bowel obstruction) (HCC) 06/16/2020  . Dementia (HCC) 06/16/2020  . Acute metabolic encephalopathy 06/16/2020  . Aspiration pneumonia (HCC) 06/16/2020  . Right leg DVT (HCC) 06/16/2020  . DNR (do not resuscitate) 06/16/2020  . Hypomagnesemia   . Colon distention   . Fecal impaction (HCC)   . Acute hypoxemic respiratory failure (HCC)   . Pressure injury of skin 02/04/2018  . Difficult intravenous access   . Constipation 01/27/2018  . Dementia due to Parkinson's disease with behavioral disturbance (HCC) 01/27/2018  . Hypokalemia 01/27/2018  . AKI (acute kidney injury) (HCC) 01/27/2018  . Leukocytosis 01/27/2018  . Acute respiratory failure with hypoxia (HCC) 01/27/2018  . Hyperbilirubinemia 01/27/2018  . Abdominal distention 01/27/2018  . Lower extremity weakness 09/03/2013  . Ambulatory dysfunction 09/03/2013  . Parkinson disease (HCC) 09/03/2013  . SVT (supraventricular tachycardia) (HCC) 03/23/2012   . Preop cardiovascular exam 07/03/2011  . Chest pain 04/26/2011  . Paroxysmal atrial fibrillation (HCC)   . Diastolic CHF (HCC)   . EDEMA 11/02/2008  . IMPAIRED GLUCOSE TOLERANCE 10/15/2008  . HYPERCHOLESTEROLEMIA 10/15/2008  . OBESITY 10/15/2008  . ANXIETY DISORDER 10/15/2008  . Essential hypertension 10/15/2008  . Coronary atherosclerosis 10/15/2008  . GASTRITIS 10/15/2008  . OSTEOARTHRITIS 10/15/2008    Palliative Care Assessment & Plan   Patient Profile: 80 y.o.femalewith past medical history of stroke, Parkinson's disease, CHF, A. fib, bowel obstruction presented to [facility] ED on 06/15/2020 from 08/15/2020 rehab with AMS. Staff noted patient was unable to swallow and also with right-sided facial droop. Head CT was unremarkable. CT chest/abdomen/pelvis was concerning for obstruction as well as aspiration pneumonia. General surgery was consulted and recommendedplacement of NG tube for gastric decompression. Patient was admitted on5/12/2022with acute metabolic encephalopathy, DVT of right leg, AKI, acute small bowel obstruction, and acute respiratory failure with hypoxia secondary to aspiration pneumonia.   Assessment: Small bowel obstruction Severe sepsis Aspiration pneumonia Atrial fibrillation with RVR Hypokalemia DVT Acute hypoxic respiratory failure Chronic microhemorrhages within the supratentorial brain Chronic diastolic CHF Acute kidney injury  Recommendations/Plan: Continue current medical interventions with watchful waiting DNR/DNI as previously documented Family will continue to make stepwise decisions based on clinical updates on patient's condition - for now, patient is improving If patient declines, family is open to continued conversation about transition to comfort care PMT will meet daughter tomorrow to complete MOST form  Goals of Care and Additional Recommendations: Limitations on Scope of Treatment: Full Scope Treatment, No Artificial Feeding  and No Tracheostomy  Code Status: DNR/DNI  Prognosis:  Unable to determine  Discharge Planning: To Be Determined  Care plan was discussed with bedside RN  Thank you for allowing the Palliative Medicine Team to assist in the care of this patient.   Total Time 27 minutes Prolonged Time Billed  no       Greater  than 50%  of this time was spent counseling and coordinating care related to the above assessment and plan.  Merry Proud, NP  Please contact Palliative Medicine Team phone at [phone] for questions and concerns.

## 2020-06-18 NOTE — Progress Notes (Addendum)
KUB w/ NG tube in esophagus, contrast in colon, significant small bowel distention and some residual stool in rectum. Patient with frequent BMs that started prior to enema yesterday and continue this AM. Given persistent and significant small bowel distention, along with age and co-morbidities, I will ask RN to advance NG tube 10 cm and continue to LIWS and monitor. If her NG tube output remains low tomorrow and she is having bowel function then NG can likely be removed. NG tube removal followed by aspiration could be a catastrophic event for this patient which is why I favor leaving it in for today.  Hosie Spangle, PA-C [facility] Please see Amion for pager number during day hours 7:00am-4:30pm

## 2020-06-18 NOTE — Progress Notes (Signed)
HR went up 120's-140's again around 1600. HR was down to 80's. Frequently HR up and down. Cardiac rhythm changed NSR to A-fib or SVT then back to NSR. Administered metoprolol 2.5 mg IVP. Cardiac PA Angie notified this matter and continue to monitor cardiac rhythm and HR. HS [employer]

## 2020-06-18 NOTE — Progress Notes (Signed)
HR went up 120's-140's around 1211 SVT or ST. Few min went down to 80's with NSR. Then tachycardia again. Dr. Elvera Lennox notified this matter and ordered metoprolol 2.5 mg IVP. HR went down to 80's again. No metoprolol given but HR went up 120's -140's again for more than 5 mins. Cardiology PA Angie notified this matter. Ordered 12 lead EKG, but HR went down to 80's. Continue to monitor HR. No EKG needed at this time. HS [employer]

## 2020-06-19 DIAGNOSIS — I471 Supraventricular tachycardia: Secondary | ICD-10-CM

## 2020-06-19 LAB — BASIC METABOLIC PANEL
Anion gap: 9 (ref 5–15)
BUN: 19 mg/dL (ref 8–23)
CO2: 23 mmol/L (ref 22–32)
Calcium: 8.5 mg/dL — ABNORMAL LOW (ref 8.9–10.3)
Chloride: 117 mmol/L — ABNORMAL HIGH (ref 98–111)
Creatinine, Ser: 1.03 mg/dL — ABNORMAL HIGH (ref 0.44–1.00)
GFR, Estimated: 54 mL/min — ABNORMAL LOW (ref >60)
Glucose, Bld: 83 mg/dL (ref 70–99)
Potassium: 3.5 mmol/L (ref 3.5–5.1)
Sodium: 149 mmol/L — ABNORMAL HIGH (ref 135–145)

## 2020-06-19 LAB — CBC
HCT: 42.2 % (ref 36.0–46.0)
Hemoglobin: 13.4 g/dL (ref 12.0–15.0)
MCH: 25.8 pg — ABNORMAL LOW (ref 26.0–34.0)
MCHC: 31.8 g/dL (ref 30.0–36.0)
MCV: 81.3 fL (ref 80.0–100.0)
Platelets: 167 10*3/uL (ref 150–400)
RBC: 5.19 MIL/uL — ABNORMAL HIGH (ref 3.87–5.11)
RDW: 15.7 % — ABNORMAL HIGH (ref 11.5–15.5)
WBC: 13.1 10*3/uL — ABNORMAL HIGH (ref 4.0–10.5)
nRBC: 0 % (ref 0.0–0.2)

## 2020-06-19 MED ORDER — ENOXAPARIN SODIUM 40 MG/0.4ML IJ SOSY
40.0000 mg | PREFILLED_SYRINGE | INTRAMUSCULAR | Status: DC
  Administered 2020-06-20 – 2020-06-23 (×4): 40 mg via SUBCUTANEOUS
  Filled 2020-06-19 (×4): qty 0.4

## 2020-06-19 MED ORDER — DEXTROSE 5 % IV SOLN: INTRAVENOUS | Status: DC

## 2020-06-19 NOTE — Care Management Important Message (Signed)
Important Message  Patient Details  Name: [name] MRN: [id number] Date of Birth: May 04, 1940   Medicare Important Message Given:  Yes     Dorena Bodo 06/19/2020, 2:54 PM

## 2020-06-19 NOTE — Progress Notes (Signed)
Sinus rhythm on telemetry. Episodes of "VT" are all artifactual (due to tremor?). There is one episode of sustained atrial tachycardia, lasting only about 2 minutes. Transition back to po/NGT amiodarone when receiving enteral feeding.

## 2020-06-19 NOTE — Progress Notes (Addendum)
[facility] Progress Note     Subjective: CC-  No complaints this morning. Denies abdominal pain, nausea, vomiting. BM x3 yesterday and one this morning. NG with no output. Xray this AM shows NG at GEJ, persistently dilated loops of bowel.  Objective: Vital signs in last 24 hours: Temp:  [97.8 F (36.6 C)-98.9 F (37.2 C)] 97.8 F (36.6 C) (05/16 0747) Pulse Rate:  [79-190] 79 (05/16 0747) Resp:  [17-20] 17 (05/16 0747) BP: (101-150)/(75-99) 145/90 (05/16 0747) SpO2:  [91 %-95 %] 93 % (05/16 0747) Last BM Date: 06/18/20  Intake/Output from previous day: 05/15 0701 - 05/16 0700 In: 1283 [I.V.:1183; IV Piggyback:100] Out: 0  Intake/Output this shift: No intake/output data recorded.  PE: Gen:  NAD, pleasantly demented Card:  Irregular rhythm, regular rate Pulm:  rate and effort normal Abd: Soft, abdomen is mildly distended with upper abdominal fullness, abdomen nontender to light and deep palpation Skin: warm and dry, no rashes   Lab Results:  Recent Labs    06/18/20 0806 06/19/20 0015  WBC 13.6* 13.1*  HGB 13.3 13.4  HCT 42.4 42.2  PLT 134* 167   BMET Recent Labs    06/18/20 0806 06/19/20 0015  NA 147* 149*  K 3.6 3.5  CL 118* 117*  CO2 19* 23  GLUCOSE 88 83  BUN 27* 19  CREATININE 1.03* 1.03*  CALCIUM 8.2* 8.5*   PT/INR No results for input(s): LABPROT, INR in the last 72 hours. CMP     Component Value Date/Time   NA 149 (H) 06/19/2020 0015   K 3.5 06/19/2020 0015   CL 117 (H) 06/19/2020 0015   CO2 23 06/19/2020 0015   GLUCOSE 83 06/19/2020 0015   BUN 19 06/19/2020 0015   CREATININE 1.03 (H) 06/19/2020 0015   CALCIUM 8.5 (L) 06/19/2020 0015   PROT 5.6 (L) 06/18/2020 0806   ALBUMIN 2.4 (L) 06/18/2020 0806   AST 20 06/18/2020 0806   ALT 14 06/18/2020 0806   ALKPHOS 70 06/18/2020 0806   BILITOT 0.8 06/18/2020 0806   GFRNONAA 54 (L) 06/19/2020 0015   GFRAA >60 02/13/2018 0701   Lipase     Component Value Date/Time   LIPASE 63  (H) 01/27/2018 1110       Studies/Results: DG Abd Portable 1V  Result Date: 06/19/2020 CLINICAL DATA:  Resistance while advancing NG tube. EXAM: PORTABLE ABDOMEN - 1 VIEW COMPARISON:  06/18/2020 radiograph FINDINGS: Persistent dilated loops bowel in the abdomen. There is contrast within the ascending and descending colon. Unchanged calcified left upper quadrant/splenic mass. Nasogastric tube side port overlies the distal stomach and tip overlying the gastroesophageal junction. There are bibasilar and right upper lung airspace opacities. Curvilinear opacity overlying the left lower lung which likely represents atelectasis. IMPRESSION: Nasogastric tube side port overlies the distal esophagus, tip overlying the gastroesophageal junction. Persistent markedly dilated loops of bowel in the abdomen. Bibasilar and right upper lung airspace opacities, compatible with multifocal infection or edema. Electronically Signed   By: Caprice Renshaw   On: 06/19/2020 08:18   DG Abd Portable 1V  Result Date: 06/18/2020 CLINICAL DATA:  Abdominal distention. Altered mental status. Evaluate distended small bowel. EXAM: PORTABLE ABDOMEN - 1 VIEW COMPARISON:  CT 06/15/2020 FINDINGS: Nasogastric tube is now in the LOWER esophagus. Consider advancing to 15 centimeters further into the stomach. There is no evidence for free intraperitoneal air. There is persistent significant gaseous distension of bowel loops. There is contrast within the ascending and descending colon, excluding complete small  bowel obstruction. Persistent stool identified in the distal colon. 5.5 centimeter calcified splenic lesion is stable. IMPRESSION: Nasogastric tube now in the distal esophagus. Persistent gaseous distension of bowel loops but interval progression of oral contrast material, excluding complete small bowel obstruction. No evidence for free intraperitoneal air. Electronically Signed   By: Norva Pavlov M.D.   On: 06/18/2020 11:45   DG Abd  Portable 1V  Result Date: 06/17/2020 CLINICAL DATA:  Small bowel obstruction EXAM: PORTABLE ABDOMEN - 1 VIEW COMPARISON:  06/17/2020, 1:29 a.m. FINDINGS: Redemonstrated diffusely distended small bowel, with gas-filled, nondistended colon and scattered gas and stool present to the rectum. Large stool balls are again noted in the rectum. No obvious free air on supine radiographs. Esophagogastric tube is position with tip just below the diaphragm, side port at least 5 cm above the diaphragm. IMPRESSION: Redemonstrated diffusely distended small bowel, with gas-filled, nondistended colon and scattered gas and stool present to the rectum. Large stool balls are again noted in the rectum. No obvious free air on supine radiographs. Electronically Signed   By: Lauralyn Primes M.D.   On: 06/17/2020 17:28   ECHOCARDIOGRAM LIMITED  Result Date: 06/17/2020    ECHOCARDIOGRAM LIMITED REPORT   Patient Name:   [name] Date of Exam: 06/17/2020 Medical Rec #:  [id number]        Height:       62.0 in Accession #:    [id number]       Weight:       162.9 lb Date of Birth:  [birth year]         BSA:          1.752 m Patient Age:    80 years         BP:           99/69 mmHg Patient Gender: F                HR:           73 bpm. Exam Location:  Inpatient Procedure: Limited Echo and Intracardiac Opacification Agent Indications:     R94.31 Abnormal EKG  History:         Patient has prior history of Echocardiogram examinations, most                  recent 02/03/2018. Stroke, Arrythmias:Atrial Fibrillation;                  Signs/Symptoms:Altered Mental Status. Parkinson's. Bowel                  Obstruction.  Sonographer:     Roosvelt Maser RDCS Referring Phys:  [id number] [facility] A SMITH Diagnosing Phys: Epifanio Lesches MD  Sonographer Comments: A limited echo was ordered. Very technically difficult due to altered mental status and poor windows IMPRESSIONS  1. Techincally difficult study, very limited views  2. Left ventricular ejection  fraction, by estimation, is 60 to 65%. The left ventricle has normal function. Left ventricular endocardial border not optimally defined to evaluate regional wall motion.  3. Right ventricular systolic function was not well visualized. The right ventricular size is not well visualized.  4. A small pericardial effusion is present FINDINGS  Left Ventricle: Left ventricular ejection fraction, by estimation, is 60 to 65%. The left ventricle has normal function. Left ventricular endocardial border not optimally defined to evaluate regional wall motion. Definity contrast agent was given IV to delineate the left ventricular endocardial borders. Right Ventricle: The right  ventricular size is not well visualized. Right ventricular systolic function was not well visualized. Pericardium: A small pericardial effusion is present. Aortic Valve: The aortic valve was not well visualized. Aortic valve regurgitation is not visualized. Epifanio Lesches MD Electronically signed by Epifanio Lesches MD Signature Date/Time: 06/17/2020/2:43:55 PM    Final (Updated)     Anti-infectives: Anti-infectives (From admission, onward)   Start     Dose/Rate Route Frequency Ordered Stop   06/18/20 1000  Ampicillin-Sulbactam (UNASYN) 3 g in sodium chloride 0.9 % 100 mL IVPB        3 g 200 mL/hr over 30 Minutes Intravenous Every 8 hours 06/18/20 0947     06/16/20 1200  Ampicillin-Sulbactam (UNASYN) 3 g in sodium chloride 0.9 % 100 mL IVPB  Status:  Discontinued        3 g 200 mL/hr over 30 Minutes Intravenous Every 12 hours 06/16/20 1153 06/18/20 0947   06/15/20 2100  Ampicillin-Sulbactam (UNASYN) 3 g in sodium chloride 0.9 % 100 mL IVPB        3 g 200 mL/hr over 30 Minutes Intravenous  Once 06/15/20 2009 06/15/20 2204   06/15/20 2100  vancomycin (VANCOREADY) IVPB 1000 mg/200 mL        1,000 mg 200 mL/hr over 60 Minutes Intravenous  Once 06/15/20 2009 06/16/20 0023       Assessment/Plan hypertension paroxysmal atrial  fibrillation 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 Parkinsons Dementia RLE age indeterminate DVT - Lovenox per pharmacy  AKI Acute hypoxic respiratory failure 2/2 aspiration PNA  Rapid afib - per cardiology, no AV nodal agents, on IV amio -- above per TRH --   SBO as well as fecal impaction - appreciate palliative evaluation and following, continue current medical interventions and family will make step-wise decisions based on test results/patient clinical status, DNR/DNI, patient clinical decline may cause family to transition to comfort care but currently wants to continue full medical care - given age, debility, and co-morbidities patients perioperative morbidity/mortality risk would be high. - Patient is having bowel function but xray shows persistently dilated loops of small bowel. She has had no n/v and nothing out of NG tube, but it is not in her stomach. D/c NG tube today but keep NPO.  FEN: NPO, IVF ID: [email or web address] VTE: SCD's, therapeutic lovenox for DVT Foley: external cath Code status: DNR/DNI    LOS: 3 days    Franne Forts, [facility] 06/19/2020, 11:45 AM Please see Amion for pager number during day hours 7:00am-4:30pm

## 2020-06-19 NOTE — Progress Notes (Signed)
NG removed without difficulty.  Patient tolerated well.  Mouth care completed and face washed.

## 2020-06-19 NOTE — Plan of Care (Signed)
  Problem: Education: Goal: Knowledge of General Education information will improve Description: Including pain rating scale, medication(s)/side effects and non-pharmacologic comfort measures Outcome: Not Met (Patient with dementia)   Problem: Health Behavior/Discharge Planning: Goal: Ability to manage health-related needs will improve Outcome: Not Met (add Reason)   Problem: Clinical Measurements: Goal: Ability to maintain clinical measurements within normal limits will improve Outcome: Not Met (add Reason) Goal: Will remain free from infection Outcome: Not Met (add Reason) Goal: Diagnostic test results will improve Outcome: Not Met (add Reason) Goal: Respiratory complications will improve Outcome: Not Met (add Reason) Goal: Cardiovascular complication will be avoided Outcome: Not Met (add Reason)   Problem: Activity: Goal: Risk for activity intolerance will decrease Outcome: Not Met (add Reason)   Problem: Nutrition: Goal: Adequate nutrition will be maintained Outcome: Not Met (add Reason)   Problem: Coping: Goal: Level of anxiety will decrease Outcome: Not Met (add Reason)   Problem: Elimination: Goal: Will not experience complications related to bowel motility Outcome: Not Met (add Reason) Goal: Will not experience complications related to urinary retention Outcome: Not Met (add Reason)   Problem: Pain Managment: Goal: General experience of comfort will improve Outcome: Not Met (add Reason)   Problem: Safety: Goal: Ability to remain free from injury will improve Outcome: Not Met (add Reason)   Problem: Skin Integrity: Goal: Risk for impaired skin integrity will decrease Outcome: Not Met (add Reason)

## 2020-06-19 NOTE — TOC Initial Note (Signed)
Transition of Care [facility]) - Initial/Assessment Note    Patient Details  Name: [name] MRN: [id number] Date of Birth: Jul 03, 1940  [facility] CM/SW Contact:    Carley Hammed, LCSWA Phone Number: 06/19/2020, 2:45 PM  Clinical Narrative:                 CSW was advised that pt's daughter had decided to pursue hospice at pt's LTC facility[facility]. CSW followed up w/ dtr and she is agreeable for pt to return to [facility] and would prefer [facility] to follow. Facility is agreeable to pt returning, and will wait for TOC to contact w/ DC information.  Expected Discharge Plan: Skilled Nursing Facility Barriers to Discharge: Continued Medical Work up,Transportation,SNF Pending discharge orders   Patient Goals and CMS Choice Patient states their goals for this hospitalization and ongoing recovery are:: Unable to participate in goal setting due to disorientation. CMS Medicare.gov Compare Post Acute Care list provided to:: Patient Represenative (must comment) (Daughter, [name]) Choice offered to / list presented to : Adult Children  Expected Discharge Plan and Services Expected Discharge Plan: Skilled Nursing Facility     Post Acute Care Choice: Skilled Nursing Facility Living arrangements for the past 2 months: Skilled Nursing Facility                                      Prior Living Arrangements/Services Living arrangements for the past 2 months: Skilled Nursing Facility Lives with:: Facility Resident Patient language and need for interpreter reviewed:: Yes Do you feel safe going back to the place where you live?: Yes      Need for Family Participation in Patient Care: Yes (Comment) Care giver support system in place?: Yes (comment)   Criminal Activity/Legal Involvement Pertinent to Current Situation/Hospitalization: No - Comment as needed  Activities of Daily Living      Permission Sought/Granted Permission sought to  share information with : Family Electrical engineer Permission granted to share information with : Yes, Verbal Permission Granted  Share Information with NAME: Alfonzo Beers  Permission granted to share info w AGENCY: [facility]  Permission granted to share info w Relationship: Daughter  Permission granted to share info w Contact Information: [phone]  Emotional Assessment Appearance:: Appears stated age Attitude/Demeanor/Rapport: Unable to Assess Affect (typically observed): Unable to Assess Orientation: :  (Disoriented x4) Alcohol / Substance Use: Not Applicable Psych Involvement: No (comment)  Admission diagnosis:  Somnolence [R40.0] Small bowel obstruction (HCC) [K56.609] SBO (small bowel obstruction) (HCC) [K56.609] Pain [R52] Encounter for imaging study to confirm nasogastric (NG) tube placement [Z01.89] Aspiration pneumonia of both lungs due to gastric secretions, unspecified part of lung (HCC) [J69.0] Patient Active Problem List   Diagnosis Date Noted  . SBO (small bowel obstruction) (HCC) 06/16/2020  . Dementia (HCC) 06/16/2020  . Acute metabolic encephalopathy 06/16/2020  . Aspiration pneumonia (HCC) 06/16/2020  . Right leg DVT (HCC) 06/16/2020  . DNR (do not resuscitate) 06/16/2020  . Hypomagnesemia   . Colon distention   . Fecal impaction (HCC)   . Acute hypoxemic respiratory failure (HCC)   . Pressure injury of skin 02/04/2018  . Difficult intravenous access   . Constipation 01/27/2018  . Dementia due to Parkinson's disease with behavioral disturbance (HCC) 01/27/2018  . Hypokalemia 01/27/2018  . AKI (acute kidney injury) (HCC) 01/27/2018  . Leukocytosis 01/27/2018  . Acute  respiratory failure with hypoxia (HCC) 01/27/2018  . Hyperbilirubinemia 01/27/2018  . Abdominal distention 01/27/2018  . Lower extremity weakness 09/03/2013  . Ambulatory dysfunction 09/03/2013  . Parkinson disease (HCC) 09/03/2013  . SVT (supraventricular  tachycardia) (HCC) 03/23/2012  . Preop cardiovascular exam 07/03/2011  . Chest pain 04/26/2011  . Paroxysmal atrial fibrillation (HCC)   . Diastolic CHF (HCC)   . EDEMA 11/02/2008  . IMPAIRED GLUCOSE TOLERANCE 10/15/2008  . HYPERCHOLESTEROLEMIA 10/15/2008  . OBESITY 10/15/2008  . ANXIETY DISORDER 10/15/2008  . Essential hypertension 10/15/2008  . Coronary atherosclerosis 10/15/2008  . GASTRITIS 10/15/2008  . OSTEOARTHRITIS 10/15/2008   PCP:  Nadara Eaton, MD Pharmacy:   [facility] [address] [place] [address] Phone: [phone] Fax: [phone]     Social Determinants of Health (SDOH) Interventions    Readmission Risk Interventions No flowsheet data found.

## 2020-06-19 NOTE — Progress Notes (Signed)
PROGRESS NOTE  [name] EHO:[id number] DOB: 06/01/1940 DOA: 06/15/2020 PCP: Javier Glazier, MD   LOS: 3 days   Brief Narrative / Interim history: 80 year old female with HTN, PAF, diastolic CHF last EF 37-04%, prior CVA with residual right-sided weakness, dementia comes in from her nursing facility being acutely altered.  At baseline she is able to recognize family members, answering yes and no questions but over the last couple of days she has been poorly responsive and has had several episodes of vomiting.  She has been more lethargic.  She was brought to the hospital and ED she was found to be hypotensive, also CT of the abdomen and pelvis showed multiple distended loops of bowel concerning for small bowel obstruction.  Imaging also was concerning for multifocal pneumonia.  General surgery was consulted, she had an NG tube placed and she was placed on antibiotic for aspiration pneumonia.  She was found to have A. fib with RVR with wide-complex tachycardia and cardiology was consulted and she was placed on amiodarone drip.  Given poor baseline and multiple medical problems palliative care was consulted as well  Subjective / 24h Interval events: Alert, underlying dementia  Assessment & Plan: Principal Problem Small bowel obstruction -conservative management for now, appreciate general surgery follow-up.  Continue NG tube, IV fluids and conservative care -Management per general surgery  Active Problems Severe sepsis due to aspiration pneumonia-met criteria with hypotension, tachycardia, as well as elevated lactic acid.  She was fluid resuscitated and blood pressure has improved. -Continue fluids while n.p.o. -Continue Unasyn, today day #4, procalcitonin was elevated.  Plan to total of 7 days  PAF, A. fib with RVR -patient has a history of paroxysmal A. fib developed RVR in the ED in the setting of pneumonia, dehydration.  Cardiology consulted -Continue amiodarone, 2D echo done shows  EF 60 to 65% -Not anticoagulated due to concern for high risk of brain bleed.  We will switch to oral amiodarone when able to eat  Hypokalemia-due to losses, continue to replete  Hypernatremia-change fluids to D5W  Age indeterminate DVT - involving the right common femoral vein, SF junction, right femoral vein, right proximal profunda vein, right popliteal vein, and right posterior tibial veins.  Hold anticoagulation due to high risk of brain bleed.  Discussed this with daughter at bedside, she is in agreement  Acute hypoxic respiratory failure -multifactorial in the setting of aspiration pneumonia, small right-sided effusion.  Provide supplemental oxygen, monitor closely fluid status  Chronic microhemorrhages within the supratentorial brain -progressive, discussed with neurology over the phone, given concern for amyloid there is a fairly decent chance of bleed.  Hold anticoagulation, discussed with the daughter at bedside on 8/88  Chronic diastolic CHF -slight lower extremity edema, suspect chronic.  Monitor fluid status closely.  BNP elevated at 274  Acute kidney injury -likely in the setting of sepsis, dehydration due to vomiting secondary to bowel obstruction.  Creatinine normal  Coronary artery disease -no chest pain, stable.  Negative high-sensitivity troponin  Goals of care-family confirms DNR but want full medical care at this point  Scheduled Meds: . enoxaparin (LOVENOX) injection  30 mg Subcutaneous Q24H  . metoprolol tartrate  2.5 mg Intravenous Q6H   Continuous Infusions: . amiodarone 30 mg/hr (06/19/20 0556)  . ampicillin-sulbactam (UNASYN) IV 3 g (06/19/20 0244)  . dextrose 50 mL/hr at 06/19/20 0659   PRN Meds:.acetaminophen **OR** acetaminophen, albuterol  DVT prophylaxis: enoxaparin (LOVENOX) injection 30 mg Start: 06/18/20 0900     Code  Status: DNR  Family Communication: No family at bedside this morning  Status is: Inpatient  Remains inpatient appropriate  because:Inpatient level of care appropriate due to severity of illness   Dispo: The patient is from: SNF              Anticipated d/c is to: SNF              Patient currently is not medically stable to d/c.   Difficult to place patient No  Level of care: Progressive  Consultants:  General surgery Cardiology Critical care Palliative care  Procedures:  2D echo:  1. Techincally difficult study, very limited views  2. Left ventricular ejection fraction, by estimation, is 60 to 65%. The left ventricle has normal function. Left ventricular endocardial border not optimally defined to evaluate regional wall motion.  3. Right ventricular systolic function was not well visualized. The right ventricular size is not well visualized.  4. A small pericardial effusion is present   Microbiology  none  Antimicrobials: Unasyn 5/13 >>    Objective: Vitals:   06/18/20 2300 06/18/20 2337 06/19/20 0431 06/19/20 0747  BP:  (!) 150/84 (!) 150/82 (!) 145/90  Pulse: 85 83 79 79  Resp: _0 Temp:  98.5 F (36.9 C) 98.5 F (36.9 C) 97.8 F (36.6 C)  TempSrc:  Oral Oral Axillary  SpO2: 92% 92% 93% 93%  Weight:      Height:        Intake/Output Summary (Last 24 hours) at 06/19/2020 0932 Last data filed at 06/19/2020 0244 Gross per 24 hour  Intake 1026.29 ml  Output 0 ml  Net 1026.29 ml   Filed Weights   07/01/20 1402 06/17/20 0500 06/18/20 0407  Weight: 77.6 kg 73.9 kg 73.9 kg    Examination:  Constitutional: No distress, in bed Eyes: No scleral icterus ENMT: mmm Neck: normal, supple Respiratory: Clear bilaterally, no wheezing, no crackles, normal effort Cardiovascular: Irregular, no murmurs, 1+ edema Abdomen: Nontender, nondistended, bowel sounds positive Musculoskeletal: no clubbing / cyanosis.  Skin: No rashes seen Neurologic: Does not follow commands consistently, remains nonfocal  Data Reviewed: I have independently reviewed following labs and imaging studies    CBC: Recent Labs  Lab Jul 01, 2020 1410 06/16/20 2145 06/17/20 0040 06/17/20 0434 06/17/20 1731 06/18/20 0806 06/19/20 0015  WBC 8.5   < > 10.7* 26.6* 13.9* 13.6* 13.1*  NEUTROABS 6.2  --   --   --   --   --   --   HGB 16.2*   < > 13.3 14.2 12.9 13.3 13.4  HCT 52.0*   < > 42.1 50.0* 41.6 42.4 42.2  MCV 84.8   < > 83.4 91.9 84.6 85.0 81.3  PLT 253   < > 145* 160 146* 134* 167   < > = values in this interval not displayed.   Basic Metabolic Panel: Recent Labs  Lab 06/16/20 1836 06/16/20 2312 06/17/20 1731 06/18/20 0806 06/19/20 0015  NA 146* 144 146* 147* 149*  K <2.0* 3.4* 3.8 3.6 3.5  CL >130* 113* 114* 118* 117*  CO2 11* 21* 22 19* 23  GLUCOSE 59* 109* 80 88 83  BUN 31* 49* 35* 27* 19  CREATININE 0.89 1.78* 1.28* 1.03* 1.03*  CALCIUM <4.0* 7.4* 7.9* 8.2* 8.5*  MG  --  1.9  --  2.5*  --   PHOS  --   --   --  1.7*  --    Liver Function Tests: Recent Labs  Lab 06/15/20 1656 06/16/20 1836 06/16/20 2312 06/17/20 1731 06/18/20 0806  AST 19 14* _0 ALT _1 ALKPHOS 81 24* 59 66 70  BILITOT 1.8* 0.5 1.2 1.0 0.8  PROT 6.4* <3.0* 5.4* 5.6* 5.6*  ALBUMIN 3.4* <1.0* 2.5* 2.5* 2.4*   Coagulation Profile: No results for input(s): INR, PROTIME in the last 168 hours. HbA1C: No results for input(s): HGBA1C in the last 72 hours. CBG: Recent Labs  Lab 06/15/20 1920  GLUCAP 150*    Recent Results (from the past 240 hour(s))  Urine culture     Status: Abnormal   Collection Time: 06/15/20  2:32 PM   Specimen: Urine, Random  Result Value Ref Range Status   Specimen Description URINE, RANDOM  Final   Special Requests   Final    NONE Performed at [facility], [address], [place], [place] [address]    Culture MULTIPLE SPECIES PRESENT, SUGGEST RECOLLECTION (A)  Final   Report Status 06/17/2020 FINAL  Final  SARS CORONAVIRUS 2 (TAT 6-24 HRS) Nasopharyngeal Urine, Clean Catch     Status: None   Collection Time: 06/15/20  7:24 PM   Specimen:  Urine, Clean Catch; Nasopharyngeal  Result Value Ref Range Status   SARS Coronavirus 2 NEGATIVE NEGATIVE Final    Comment: (NOTE) SARS-CoV-2 target nucleic acids are NOT DETECTED.  The SARS-CoV-2 RNA is generally detectable in upper and lower respiratory specimens during the acute phase of infection. Negative results do not preclude SARS-CoV-2 infection, do not rule out co-infections with other pathogens, and should not be used as the sole basis for treatment or other patient management decisions. Negative results must be combined with clinical observations, patient history, and epidemiological information. The expected result is Negative.  Fact Sheet for Patients: SugarRoll.be  Fact Sheet for Healthcare Providers: https://www.woods-mathews.com/  This test is not yet approved or cleared by the [place] FDA and  has been authorized for detection and/or diagnosis of SARS-CoV-2 by FDA under an Emergency Use Authorization (EUA). This EUA will remain  in effect (meaning this test can be used) for the duration of the COVID-19 declaration under Se ction 564(b)(1) of the Act, 21 U.S.C. section 360bbb-3(b)(1), unless the authorization is terminated or revoked sooner.  Performed at [facility], [place] [address], [place], [place] [address]   MRSA PCR Screening     Status: None   Collection Time: 06/17/20  2:39 PM   Specimen: Nasopharyngeal  Result Value Ref Range Status   MRSA by PCR NEGATIVE NEGATIVE Final    Comment:        The GeneXpert MRSA Assay (FDA approved for NASAL specimens only), is one component of a comprehensive MRSA colonization surveillance program. It is not intended to diagnose MRSA infection nor to guide or monitor treatment for MRSA infections. Performed at [facility], [place] [address] [place], [place] [address]      Radiology Studies: DG Abd Portable 1V  Result Date: 06/19/2020 CLINICAL DATA:   Resistance while advancing NG tube. EXAM: PORTABLE ABDOMEN - 1 VIEW COMPARISON:  06/18/2020 radiograph FINDINGS: Persistent dilated loops bowel in the abdomen. There is contrast within the ascending and descending colon. Unchanged calcified left upper quadrant/splenic mass. Nasogastric tube side port overlies the distal stomach and tip overlying the gastroesophageal junction. There are bibasilar and right upper lung airspace opacities. Curvilinear opacity overlying the left lower lung which likely represents atelectasis. IMPRESSION: Nasogastric tube side port overlies  the distal esophagus, tip overlying the gastroesophageal junction. Persistent markedly dilated loops of bowel in the abdomen. Bibasilar and right upper lung airspace opacities, compatible with multifocal infection or edema. Electronically Signed   By: Maurine Simmering   On: 06/19/2020 08:18   DG Abd Portable 1V  Result Date: 06/18/2020 CLINICAL DATA:  Abdominal distention. Altered mental status. Evaluate distended small bowel. EXAM: PORTABLE ABDOMEN - 1 VIEW COMPARISON:  CT 06/15/2020 FINDINGS: Nasogastric tube is now in the LOWER esophagus. Consider advancing to 15 centimeters further into the stomach. There is no evidence for free intraperitoneal air. There is persistent significant gaseous distension of bowel loops. There is contrast within the ascending and descending colon, excluding complete small bowel obstruction. Persistent stool identified in the distal colon. 5.5 centimeter calcified splenic lesion is stable. IMPRESSION: Nasogastric tube now in the distal esophagus. Persistent gaseous distension of bowel loops but interval progression of oral contrast material, excluding complete small bowel obstruction. No evidence for free intraperitoneal air. Electronically Signed   By: Nolon Nations M.D.   On: 06/18/2020 11:45   Marzetta Board, MD, PhD [facility]  Between 7 am - 7 pm I am available, please contact me via Amion (for  emergencies) or Securechat (non urgent messages)  Between 7 pm - 7 am I am not available, please contact night coverage MD/APP via Amion

## 2020-06-19 NOTE — Progress Notes (Signed)
Daily Progress Note   Patient Name: [name]       Date: 06/19/2020 DOB: 1940-09-15  Age: 80 y.o. MRN#: [id number] Attending Physician: Leatha Gilding, MD Primary Care Physician: Nadara Eaton, MD Admit Date: 06/15/2020  Reason for Consultation/Follow-up: Establishing goals of care  Subjective: Chart reviewed. Per surgery, patient is having bowel function but xray shows persistently dilated loops of small bowel.  Plan is to remove NGT but keep patient NPO.   I went to visit patient at bedside. She remains pleasantly confused with mostly unintelligible speech. Daughter Channel is at bedside. She reports her mother's current condition is close to her baseline. I provided Channel with an update on plan of care from surgery.   Discussed again that goal of care is for patient to return to SN[facility]) when she is medically stable.  Hospice and Palliative Care services outpatient were explained and offered. Discussed the differences between Palliative and Hospice care - emphasizing they have similar goals of managing symptoms, promoting comfort, improving quality of life, and maintaining a person's dignity. However, palliative care may be offered during any phase of a serious illness, while hospice care is usually offered when a person is expected to live for 6 months or less.   Provided education and counseling on the philosophy and benefits of hospice care. Discussed that it offers a holistic approach to care in the setting of end-stage illness/disease, and is about supporting the patient where they are allowing nature to take it's course. Hospice promotes improved quality life, helping a patient feel as good as possible for as long as possible. Discussed the hospice team includes  RNs, physicians, social workers, and chaplains. They can provide personal care, support for the family, and help keep patient out of the hospital. Channel is agreeable to hospice services at the SNF and would prefer [facility].   Channel and I completed a MOST form together. The following treatment decisions were discussed:  Cardiopulmonary Resuscitation: Do Not Attempt Resuscitation (DNR/No CPR)  Medical Interventions: Limited Additional Interventions: Use medical treatment, IV fluids and cardiac monitoring as indicated, DO NOT USE intubation or mechanical ventilation. May consider use of less invasive airway support such as BiPAP or CPAP. Also provide comfort measures. Transfer to the hospital if indicated. Avoid intensive care.  Antibiotics: Determine use of limitation of antibiotics when infection occurs  IV Fluids: IV fluids for a defined trial period  Feeding Tube: No feeding tube     Length of Stay: 3  Current Medications: Scheduled Meds:  . enoxaparin (LOVENOX) injection  30 mg Subcutaneous Q24H  . metoprolol tartrate  2.5 mg Intravenous Q6H    Continuous Infusions: . amiodarone 30 mg/hr (06/19/20 0556)  . ampicillin-sulbactam (UNASYN) IV 3 g (06/19/20 1106)  . dextrose 50 mL/hr at 06/19/20 0659    PRN Meds: acetaminophen **OR** acetaminophen, albuterol  Physical Exam Vitals reviewed.  Constitutional:      General: She is not in acute distress.    Comments: Chronically ill-appearing  Cardiovascular:     Rate and Rhythm: Normal rate and regular rhythm.  Pulmonary:     Effort: Pulmonary effort is normal.  Neurological:     Mental Status: She is alert.  Psychiatric:        Speech: Speech is slurred.        Cognition and Memory: Cognition is impaired.             Vital Signs: BP (!) 145/90 (BP Location: Right Arm)   Pulse 79   Temp 97.8 F (36.6 C) (Axillary)   Resp 17   Ht 5\' 2"  (1.575 m)   Wt 73.9 kg   SpO2 93%   BMI 29.80 kg/m  SpO2: SpO2:  93 % O2 Device: O2 Device: Room Air  Intake/output summary:   Intake/Output Summary (Last 24 hours) at 06/19/2020 1259 Last data filed at 06/19/2020 0244 Gross per 24 hour  Intake 759.63 ml  Output --  Net 759.63 ml   LBM: Last BM Date: 06/18/20 Baseline Weight: Weight: 77.6 kg Most recent weight: Weight: 73.9 kg       Palliative Assessment/Data: PPS 10% (NPO)      Palliative Care Assessment & Plan   Patient Profile: 80 y.o.femalewith past medical history of stroke, Parkinson's disease, CHF, A. fib, bowel obstruction presented to [facility] ED on 06/15/2020 from 08/15/2020 rehab with AMS. Staff noted patient was unable to swallow and also with right-sided facial droop. Head CT was unremarkable. CT chest/abdomen/pelvis was concerning for obstruction as well as aspiration pneumonia. General surgery was consulted and recommendedplacement of NG tube for gastric decompression. Patient was admitted on5/12/2022with acute metabolic encephalopathy, DVT of right leg, AKI, acute small bowel obstruction, and acute respiratory failure with hypoxia secondary to aspiration pneumonia.  Assessment: Small bowel obstruction Severe sepsis Aspiration pneumonia Atrial fibrillation with RVR Hypokalemia DVT Acute hypoxic respiratory failure Chronic microhemorrhages within the supratentorial brain Chronic diastolic CHF Acute kidney injury  Recommendations/Plan: Continue current medical interventions DNR/DNI as previously documented MOST form completed - original placed on on shadow chart and copy made to scan into EMR/Vyn[employer] Family will continue to make stepwise decisions based on clinical updates on patient's condition - for now patient is improving Referral for hosp[facility] care at SNF - [facility] - TOC order placed   Goals of Care and Additional Recommendations: Limitations on Scope of Treatment: No Artificial Feeding and No Tracheostomy  Code Status: DNR/DNI  Prognosis:   < 6 months  Discharge Planning: Skilled Nursing Facility with Hospice  Care plan was discussed with [facility], case management, CSW, bedside RN  Thank you for allowing the Palliative Medicine Team to assist in the care of this patient.   Total Time 35 minutes Prolonged Time Billed  no       Greater  than 50%  of this time was spent counseling and coordinating care related to the above assessment and plan.  Merry Proud, NP  Please contact Palliative Medicine Team phone at [phone] for questions and concerns.

## 2020-06-20 ENCOUNTER — Inpatient Hospital Stay (HOSPITAL_COMMUNITY): Payer: Medicare Other

## 2020-06-20 LAB — BASIC METABOLIC PANEL
Anion gap: 9 (ref 5–15)
BUN: 14 mg/dL (ref 8–23)
CO2: 21 mmol/L — ABNORMAL LOW (ref 22–32)
Calcium: 8.4 mg/dL — ABNORMAL LOW (ref 8.9–10.3)
Chloride: 114 mmol/L — ABNORMAL HIGH (ref 98–111)
Creatinine, Ser: 1 mg/dL (ref 0.44–1.00)
GFR, Estimated: 56 mL/min — ABNORMAL LOW (ref >60)
Glucose, Bld: 137 mg/dL — ABNORMAL HIGH (ref 70–99)
Potassium: 3 mmol/L — ABNORMAL LOW (ref 3.5–5.1)
Sodium: 144 mmol/L (ref 135–145)

## 2020-06-20 MED ORDER — POTASSIUM CHLORIDE 10 MEQ/100ML IV SOLN
10.0000 meq | INTRAVENOUS | Status: AC
  Administered 2020-06-20 – 2020-06-21 (×4): 10 meq via INTRAVENOUS
  Filled 2020-06-20 (×4): qty 100

## 2020-06-20 NOTE — Progress Notes (Addendum)
PROGRESS NOTE  [name] OTL:[id number] DOB: Sep 06, 1940 DOA: 06/15/2020 PCP: Javier Glazier, MD   LOS: 4 days   Brief Narrative / Interim history: 80 year old female with HTN, PAF, diastolic CHF last EF 97-41%, prior CVA with residual right-sided weakness, dementia comes in from her nursing facility being acutely altered.  At baseline she is able to recognize family members, answering yes and no questions but over the last couple of days she has been poorly responsive and has had several episodes of vomiting.  She has been more lethargic.  She was brought to the hospital and ED she was found to be hypotensive, also CT of the abdomen and pelvis showed multiple distended loops of bowel concerning for small bowel obstruction.  Imaging also was concerning for multifocal pneumonia.  General surgery was consulted, she had an NG tube placed and she was placed on antibiotic for aspiration pneumonia.  She was found to have A. fib with RVR with wide-complex tachycardia and cardiology was consulted and she was placed on amiodarone drip.  Given poor baseline and multiple medical problems palliative care was consulted as well  Subjective / 24h Interval events: Alert, underlying dementia.  NG tube is out  Assessment & Plan: Principal Problem Small bowel obstruction -conservative management for now, appreciate general surgery follow-up.  She has started having bowel movements, NG tube output was minimal and it was eventually discontinued on 5/16.  Due to persistent small bowel obstruction on x-ray General surgery still recommending n.p.o., reassess today.  Active Problems Severe sepsis due to aspiration pneumonia-met criteria with hypotension, tachycardia, as well as elevated lactic acid.  She was fluid resuscitated and blood pressure has improved. -Continue fluids while n.p.o. -Continue Unasyn, today day #5, procalcitonin was elevated.  Plan to total of 7 days, if she is tolerating p.o. this can be  converted to Augmentin  PAF, A. fib with RVR -patient has a history of paroxysmal A. fib developed RVR in the ED in the setting of pneumonia, dehydration.  Cardiology consulted -Continue amiodarone, 2D echo done shows EF 60 to 65% -Not anticoagulated due to concern for high risk of brain bleed.  We will switch to oral amiodarone when able to eat  Hypokalemia-due to losses, continue to replete  Hypernatremia-change fluids to D5W on 5/16, sodium this morning pending  Age indeterminate DVT - involving the right common femoral vein, SF junction, right femoral vein, right proximal profunda vein, right popliteal vein, and right posterior tibial veins.  Hold anticoagulation due to high risk of brain bleed.  Discussed this with daughter at bedside, she is in agreement  Acute hypoxic respiratory failure -multifactorial in the setting of aspiration pneumonia, small right-sided effusion.  Provide supplemental oxygen, monitor closely fluid status  Chronic microhemorrhages within the supratentorial brain -progressive, discussed with neurology over the phone, given concern for amyloid there is a fairly decent chance of bleed.  Hold anticoagulation, discussed with the daughter at bedside on 6/38  Chronic diastolic CHF -slight lower extremity edema, suspect chronic.  Monitor fluid status closely.  BNP elevated at 274  Acute kidney injury -likely in the setting of sepsis, dehydration due to vomiting secondary to bowel obstruction.  Creatinine normal  Coronary artery disease -no chest pain, stable.  Negative high-sensitivity troponin  Goals of care-family confirms DNR but want full medical care at this point.  She will return home to her SNF with hospice following.  Now with NG tube is out, will advance diet per general surgery.  When she  is able to tolerate a little bit of eating as well as her oral medications could potentially go back to SNF with hospice following  Scheduled Meds: . enoxaparin (LOVENOX)  injection  40 mg Subcutaneous Q24H  . metoprolol tartrate  2.5 mg Intravenous Q6H   Continuous Infusions: . amiodarone 30 mg/hr (06/20/20 0800)  . ampicillin-sulbactam (UNASYN) IV 3 g (06/20/20 0913)  . dextrose 50 mL/hr at 06/20/20 0344   PRN Meds:.acetaminophen **OR** acetaminophen, albuterol  Diet Orders (From admission, onward)    Start     Ordered   06/19/20 1211  Diet NPO time specified  Diet effective now        06/19/20 1210        DVT prophylaxis: enoxaparin (LOVENOX) injection 40 mg Start: 06/20/20 0900     Code Status: DNR  Family Communication: No family at bedside this morning  Status is: Inpatient  Remains inpatient appropriate because:Inpatient level of care appropriate due to severity of illness   Dispo: The patient is from: SNF              Anticipated d/c is to: SNF              Patient currently is not medically stable to d/c.   Difficult to place patient No  Level of care: Progressive  Consultants:  General surgery Cardiology Critical care Palliative care  Procedures:  2D echo:  1. Techincally difficult study, very limited views  2. Left ventricular ejection fraction, by estimation, is 60 to 65%. The left ventricle has normal function. Left ventricular endocardial border not optimally defined to evaluate regional wall motion.  3. Right ventricular systolic function was not well visualized. The right ventricular size is not well visualized.  4. A small pericardial effusion is present   Microbiology  none  Antimicrobials: Unasyn 5/13 >>    Objective: Vitals:   06/19/20 2157 06/20/20 0007 06/20/20 0300 06/20/20 0732  BP: (!) 135/91 (!) 148/84 120/81   Pulse: 79 75 64   Resp: '18 20 15   ' Temp: 98.5 F (36.9 C) 98.6 F (37 C)  98.5 F (36.9 C)  TempSrc: Oral Oral Axillary Axillary  SpO2: 92% 91% 90%   Weight:   75.5 kg   Height:        Intake/Output Summary (Last 24 hours) at 06/20/2020 0917 Last data filed at 06/20/2020  0800 Gross per 24 hour  Intake 2118.19 ml  Output --  Net 2118.19 ml   Filed Weights   06/17/20 0500 06/18/20 0407 06/20/20 0300  Weight: 73.9 kg 73.9 kg 75.5 kg    Examination:  Constitutional: NAD, in bed Eyes: No icterus ENMT: mmm Neck: normal, supple Respiratory: Clear bilaterally, no wheezing Cardiovascular: Irregular, no murmurs, 1+ edema Abdomen: Soft, NT, ND, bowel sounds positive Musculoskeletal: no clubbing / cyanosis.  Skin: No rash seen Neurologic: Nonfocal  Data Reviewed: I have independently reviewed following labs and imaging studies   CBC: Recent Labs  Lab 06/15/20 1410 06/16/20 2145 06/17/20 0040 06/17/20 0434 06/17/20 1731 06/18/20 0806 06/19/20 0015  WBC 8.5   < > 10.7* 26.6* 13.9* 13.6* 13.1*  NEUTROABS 6.2  --   --   --   --   --   --   HGB 16.2*   < > 13.3 14.2 12.9 13.3 13.4  HCT 52.0*   < > 42.1 50.0* 41.6 42.4 42.2  MCV 84.8   < > 83.4 91.9 84.6 85.0 81.3  PLT 253   < >  145* 160 146* 134* 167   < > = values in this interval not displayed.   Basic Metabolic Panel: Recent Labs  Lab 06/16/20 1836 06/16/20 2312 06/17/20 1731 06/18/20 0806 06/19/20 0015  NA 146* 144 146* 147* 149*  K <2.0* 3.4* 3.8 3.6 3.5  CL >130* 113* 114* 118* 117*  CO2 11* 21* 22 19* 23  GLUCOSE 59* 109* 80 88 83  BUN 31* 49* 35* 27* 19  CREATININE 0.89 1.78* 1.28* 1.03* 1.03*  CALCIUM <4.0* 7.4* 7.9* 8.2* 8.5*  MG  --  1.9  --  2.5*  --   PHOS  --   --   --  1.7*  --    Liver Function Tests: Recent Labs  Lab 06/15/20 1656 06/16/20 1836 06/16/20 2312 06/17/20 1731 06/18/20 0806  AST 19 14* '18 21 20  ' ALT '15 7 14 14 14  ' ALKPHOS 81 24* 59 66 70  BILITOT 1.8* 0.5 1.2 1.0 0.8  PROT 6.4* <3.0* 5.4* 5.6* 5.6*  ALBUMIN 3.4* <1.0* 2.5* 2.5* 2.4*   Coagulation Profile: No results for input(s): INR, PROTIME in the last 168 hours. HbA1C: No results for input(s): HGBA1C in the last 72 hours. CBG: Recent Labs  Lab 06/15/20 1920  GLUCAP 150*    Recent  Results (from the past 240 hour(s))  Urine culture     Status: Abnormal   Collection Time: 06/15/20  2:32 PM   Specimen: Urine, Random  Result Value Ref Range Status   Specimen Description URINE, RANDOM  Final   Special Requests   Final    NONE Performed at [facility], [address] [place], [place] [address]    Culture MULTIPLE SPECIES PRESENT, SUGGEST RECOLLECTION (A)  Final   Report Status 06/17/2020 FINAL  Final  SARS CORONAVIRUS 2 (TAT 6-24 HRS) Nasopharyngeal Urine, Clean Catch     Status: None   Collection Time: 06/15/20  7:24 PM   Specimen: Urine, Clean Catch; Nasopharyngeal  Result Value Ref Range Status   SARS Coronavirus 2 NEGATIVE NEGATIVE Final    Comment: (NOTE) SARS-CoV-2 target nucleic acids are NOT DETECTED.  The SARS-CoV-2 RNA is generally detectable in upper and lower respiratory specimens during the acute phase of infection. Negative results do not preclude SARS-CoV-2 infection, do not rule out co-infections with other pathogens, and should not be used as the sole basis for treatment or other patient management decisions. Negative results must be combined with clinical observations, patient history, and epidemiological information. The expected result is Negative.  Fact Sheet for Patients: SugarRoll.be  Fact Sheet for Healthcare Providers: https://www.woods-mathews.com/  This test is not yet approved or cleared by the [place] FDA and  has been authorized for detection and/or diagnosis of SARS-CoV-2 by FDA under an Emergency Use Authorization (EUA). This EUA will remain  in effect (meaning this test can be used) for the duration of the COVID-19 declaration under Se ction 564(b)(1) of the Act, 21 U.S.C. section 360bbb-3(b)(1), unless the authorization is terminated or revoked sooner.  Performed at [facility], [place] [address] [place], [place] [address]   MRSA PCR Screening     Status: None    Collection Time: 06/17/20  2:39 PM   Specimen: Nasopharyngeal  Result Value Ref Range Status   MRSA by PCR NEGATIVE NEGATIVE Final    Comment:        The GeneXpert MRSA Assay (FDA approved for NASAL specimens only), is one component of a comprehensive MRSA colonization surveillance  program. It is not intended to diagnose MRSA infection nor to guide or monitor treatment for MRSA infections. Performed at [facility], [place] [address], [place], [place] [address]      Radiology Studies: DG Abd Portable 1V  Result Date: 06/20/2020 CLINICAL DATA:  Small bowel obstruction EXAM: PORTABLE ABDOMEN - 1 VIEW COMPARISON:  06/18/2020 FINDINGS: Dilated bowel loops in the abdomen and pelvis. Markedly dilated central bowel loops measures up to 15.7 cm may reflect the sigmoid colon. This is worsened since prior study. Contrast seen within the right colon and rectum. No free air organomegaly. Rim calcified splenic mass again noted. IMPRESSION: Dilated bowel loops throughout the abdomen and pelvis with increasing sigmoid distention measuring up to 15.7 cm. Cannot completely exclude sigmoid volvulus. Electronically Signed   By: Rolm Baptise M.D.   On: 06/20/2020 09:01   Marzetta Board, MD, PhD [facility]  Between 7 am - 7 pm I am available, please contact me via Amion (for emergencies) or Securechat (non urgent messages)  Between 7 pm - 7 am I am not available, please contact night coverage MD/APP via Amion

## 2020-06-20 NOTE — Progress Notes (Signed)
Pt K+ 3.0 Notified provider.

## 2020-06-20 NOTE — Progress Notes (Signed)
TRansported to ct dept. for ct scan of the abd/pelvis by bed awake and alert.

## 2020-06-20 NOTE — Progress Notes (Signed)
Very confused , would yell and making noise often. Repositioned for comfort. Continue to monitor.

## 2020-06-20 NOTE — Progress Notes (Signed)
Maintaining sinus rhythm.  Transition back to po/NGT amiodarone when receiving enteral feeding. Please let us know when approaching DC so we can arrange follow up, if necessary.

## 2020-06-20 NOTE — Progress Notes (Addendum)
[facility] Progress Note     Subjective: CC-  Patient comfortable this morning, no complaints. She has had no vomiting since removing NG tube. She had a BM yesterday and one this morning. Xray today shows dilated bowel loops throughout the abdomen and pelvis with increasing sigmoid distention measuring up to 15.7 cm.   Objective: Vital signs in last 24 hours: Temp:  [98.4 F (36.9 C)-98.6 F (37 C)] 98.5 F (36.9 C) (05/17 0732) Pulse Rate:  [64-84] 64 (05/17 0300) Resp:  [15-20] 15 (05/17 0300) BP: (120-148)/(78-91) 120/81 (05/17 0300) SpO2:  [90 %-93 %] 90 % (05/17 0300) Weight:  [75.5 kg] 75.5 kg (05/17 0300) Last BM Date: 06/19/20  Intake/Output from previous day: 05/16 0701 - 05/17 0700 In: 2101.5 [I.V.:1451.5; IV Piggyback:650] Out: -  Intake/Output this shift: Total I/O In: 83.3 [I.V.:83.3] Out: -   PE: Gen: NAD, pleasantly demented Card: RRR Pulm: rate and effort normal Abd: Soft, abdomen is mildly distended with upper abdominal fullness, abdomen nontender to light and deep palpation Skin: warm and dry, no rashes  Lab Results:  Recent Labs    06/18/20 0806 06/19/20 0015  WBC 13.6* 13.1*  HGB 13.3 13.4  HCT 42.4 42.2  PLT 134* 167   BMET Recent Labs    06/19/20 0015 06/20/20 0945  NA 149* 144  K 3.5 3.0*  CL 117* 114*  CO2 23 21*  GLUCOSE 83 137*  BUN 19 14  CREATININE 1.03* 1.00  CALCIUM 8.5* 8.4*   PT/INR No results for input(s): LABPROT, INR in the last 72 hours. CMP     Component Value Date/Time   NA 144 06/20/2020 0945   K 3.0 (L) 06/20/2020 0945   CL 114 (H) 06/20/2020 0945   CO2 21 (L) 06/20/2020 0945   GLUCOSE 137 (H) 06/20/2020 0945   BUN 14 06/20/2020 0945   CREATININE 1.00 06/20/2020 0945   CALCIUM 8.4 (L) 06/20/2020 0945   PROT 5.6 (L) 06/18/2020 0806   ALBUMIN 2.4 (L) 06/18/2020 0806   AST 20 06/18/2020 0806   ALT 14 06/18/2020 0806   ALKPHOS 70 06/18/2020 0806   BILITOT 0.8 06/18/2020 0806   GFRNONAA  56 (L) 06/20/2020 0945   GFRAA >60 02/13/2018 0701   Lipase     Component Value Date/Time   LIPASE 63 (H) 01/27/2018 1110       Studies/Results: DG Abd Portable 1V  Result Date: 06/20/2020 CLINICAL DATA:  Small bowel obstruction EXAM: PORTABLE ABDOMEN - 1 VIEW COMPARISON:  06/18/2020 FINDINGS: Dilated bowel loops in the abdomen and pelvis. Markedly dilated central bowel loops measures up to 15.7 cm may reflect the sigmoid colon. This is worsened since prior study. Contrast seen within the right colon and rectum. No free air organomegaly. Rim calcified splenic mass again noted. IMPRESSION: Dilated bowel loops throughout the abdomen and pelvis with increasing sigmoid distention measuring up to 15.7 cm. Cannot completely exclude sigmoid volvulus. Electronically Signed   By: Charlett Nose M.D.   On: 06/20/2020 09:01   DG Abd Portable 1V  Result Date: 06/19/2020 CLINICAL DATA:  Resistance while advancing NG tube. EXAM: PORTABLE ABDOMEN - 1 VIEW COMPARISON:  06/18/2020 radiograph FINDINGS: Persistent dilated loops bowel in the abdomen. There is contrast within the ascending and descending colon. Unchanged calcified left upper quadrant/splenic mass. Nasogastric tube side port overlies the distal stomach and tip overlying the gastroesophageal junction. There are bibasilar and right upper lung airspace opacities. Curvilinear opacity overlying the left lower lung which likely represents  atelectasis. IMPRESSION: Nasogastric tube side port overlies the distal esophagus, tip overlying the gastroesophageal junction. Persistent markedly dilated loops of bowel in the abdomen. Bibasilar and right upper lung airspace opacities, compatible with multifocal infection or edema. Electronically Signed   By: Caprice Renshaw   On: 06/19/2020 08:18    Anti-infectives: Anti-infectives (From admission, onward)   Start     Dose/Rate Route Frequency Ordered Stop   06/18/20 1000  Ampicillin-Sulbactam (UNASYN) 3 g in sodium  chloride 0.9 % 100 mL IVPB        3 g 200 mL/hr over 30 Minutes Intravenous Every 8 hours 06/18/20 0947     06/16/20 1200  Ampicillin-Sulbactam (UNASYN) 3 g in sodium chloride 0.9 % 100 mL IVPB  Status:  Discontinued        3 g 200 mL/hr over 30 Minutes Intravenous Every 12 hours 06/16/20 1153 06/18/20 0947   06/15/20 2100  Ampicillin-Sulbactam (UNASYN) 3 g in sodium chloride 0.9 % 100 mL IVPB        3 g 200 mL/hr over 30 Minutes Intravenous  Once 06/15/20 2009 06/15/20 2204   06/15/20 2100  vancomycin (VANCOREADY) IVPB 1000 mg/200 mL        1,000 mg 200 mL/hr over 60 Minutes Intravenous  Once 06/15/20 2009 06/16/20 0023       Assessment/Plan hypertension paroxysmal atrial fibrillation 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 Parkinsons Dementia RLE age indeterminate DVT - Holding anticoagulation due to high risk of brain bleed AKI Acute hypoxic respiratory failure 2/2 aspiration PNA  PAF, Afib with RVR - now in NSR, per cardiology Goals of care - family confirms DNR but want full medical care at this point, she will return home to her SNF with hospice following -- above per TRH --  SBOas well as fecal impaction - given age, debility, and co-morbidities patients perioperative morbidity/mortality risk would be high. - Patient clinically stable with benign abdominal exam, no n/v, and she is having bowel function. However xray appears worse dilated bowel loops throughout the abdomen and pelvis with increasing sigmoid distention measuring up to 15.7 cm. Keep NPO. Will obtain CT for further evaluation.  FEN: NPO, IVF ID: Unasyn VTE: SCD's, lovenox  Foley: external cath Code status: DNR/DNI   LOS: 4 days    Franne Forts, [facility] 06/20/2020, 10:49 AM Please see Amion for pager number during day hours 7:00am-4:30pm

## 2020-06-21 LAB — BASIC METABOLIC PANEL
Anion gap: 8 (ref 5–15)
BUN: 10 mg/dL (ref 8–23)
CO2: 21 mmol/L — ABNORMAL LOW (ref 22–32)
Calcium: 8.4 mg/dL — ABNORMAL LOW (ref 8.9–10.3)
Chloride: 112 mmol/L — ABNORMAL HIGH (ref 98–111)
Creatinine, Ser: 0.93 mg/dL (ref 0.44–1.00)
GFR, Estimated: >60 mL/min (ref >60)
Glucose, Bld: 139 mg/dL — ABNORMAL HIGH (ref 70–99)
Potassium: 3 mmol/L — ABNORMAL LOW (ref 3.5–5.1)
Sodium: 141 mmol/L (ref 135–145)

## 2020-06-21 LAB — CBC
HCT: 37.8 % (ref 36.0–46.0)
Hemoglobin: 12.5 g/dL (ref 12.0–15.0)
MCH: 26.9 pg (ref 26.0–34.0)
MCHC: 33.1 g/dL (ref 30.0–36.0)
MCV: 81.5 fL (ref 80.0–100.0)
Platelets: 160 10*3/uL (ref 150–400)
RBC: 4.64 MIL/uL (ref 3.87–5.11)
RDW: 15.9 % — ABNORMAL HIGH (ref 11.5–15.5)
WBC: 18.5 10*3/uL — ABNORMAL HIGH (ref 4.0–10.5)
nRBC: 0.1 % (ref 0.0–0.2)

## 2020-06-21 LAB — MAGNESIUM: Magnesium: 2.2 mg/dL (ref 1.7–2.4)

## 2020-06-21 MED ORDER — AMOXICILLIN-POT CLAVULANATE 875-125 MG PO TABS
1.0000 | ORAL_TABLET | Freq: Two times a day (BID) | ORAL | Status: DC
  Administered 2020-06-21 – 2020-06-23 (×4): 1 via ORAL
  Filled 2020-06-21 (×5): qty 1

## 2020-06-21 MED ORDER — POTASSIUM CL IN DEXTROSE 5% 20 MEQ/L IV SOLN
20.0000 meq | INTRAVENOUS | Status: DC
  Filled 2020-06-21: qty 1000

## 2020-06-21 MED ORDER — POTASSIUM CHLORIDE CRYS ER 20 MEQ PO TBCR: 40.0000 meq | EXTENDED_RELEASE_TABLET | Freq: Once | ORAL | Status: DC

## 2020-06-21 MED ORDER — METOPROLOL TARTRATE 12.5 MG HALF TABLET
12.5000 mg | ORAL_TABLET | Freq: Two times a day (BID) | ORAL | Status: DC
  Administered 2020-06-21 – 2020-06-23 (×4): 12.5 mg via ORAL
  Filled 2020-06-21 (×5): qty 1

## 2020-06-21 MED ORDER — AMIODARONE HCL 200 MG PO TABS
200.0000 mg | ORAL_TABLET | Freq: Two times a day (BID) | ORAL | Status: DC
  Administered 2020-06-21 – 2020-06-23 (×4): 200 mg via ORAL
  Filled 2020-06-21 (×5): qty 1

## 2020-06-21 MED ORDER — POTASSIUM CHLORIDE 20 MEQ PO PACK
40.0000 meq | PACK | Freq: Every day | ORAL | Status: DC
  Filled 2020-06-21 (×3): qty 2

## 2020-06-21 MED ORDER — BOOST / RESOURCE BREEZE PO LIQD CUSTOM
1.0000 | Freq: Three times a day (TID) | ORAL | Status: DC
  Administered 2020-06-21 – 2020-06-23 (×6): 1 via ORAL

## 2020-06-21 MED ORDER — POTASSIUM CHLORIDE 2 MEQ/ML IV SOLN: INTRAVENOUS | Status: DC

## 2020-06-21 NOTE — Evaluation (Signed)
Clinical/Bedside Swallow Evaluation Patient Details  Name: [name] MRN: [id number] Date of Birth: 04-30-1940  Today's Date: 06/21/2020 Time: SLP Start Time (ACUTE ONLY): 0941 SLP Stop Time (ACUTE ONLY): 0953 SLP Time Calculation (min) (ACUTE ONLY): 12 min  Past Medical History:  Past Medical History:  Diagnosis Date  . Anxiety disorder   . Atrial fibrillation (HCC)    a. new 04/2011;  b. Pradaxa  . Chronic diastolic heart failure (HCC)    Echocardiogram 04/14/11: Moderate LVH, EF 60-65%, mild LAE.  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 Gastritis   . H/O: hysterectomy   . Heart murmur    from age 41 associated with some hemoptysis  . Hypercholesteremia   . Hypertension   . Impaired glucose tolerance   . Obesity   . Osteoarthritis   . Parkinson's disease (HCC)    patient denies having parkinsons  . Stroke [facility]) 2007   Residual R sided weakness, uses a wheelchair, walker at home   Past Surgical History:  Past Surgical History:  Procedure Laterality Date  . ABDOMINAL HYSTERECTOMY    . CARPAL TUNNEL RELEASE     right  . 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 LEFT HEART CATHETERIZATION WITH CORONARY ANGIOGRAM N/A 05/03/2011   Procedure: LEFT HEART CATHETERIZATION WITH CORONARY ANGIOGRAM;  Surgeon: Herby Abraham, MD;  Location: [facility] CATH LAB;  Service: Cardiovascular;  Laterality: N/A;  . REPLACEMENT TOTAL KNEE     right  . TONSILLECTOMY     HPI:  Pt is an 80 yo female presenting with AMS and vomiting. CT of the abdomen was concerning for multifocal PNA and SBO, followed by general surgery and being treated conservatively with NGT for gastric decompression. Previous swallow evals (2019, 2020) were [facility]. PMH includes: CVA (residual R sided weakness), PD, CHF, anxiety   Assessment / Plan / Recommendation Clinical Impression  Pt presents with signs of what appears  to be a cognitively-based oral dysphagia. She needs cues to attend to task, accept boluses, and intermittently to facilitate oral holding with small amounts of puree given to test for ability to take PO meds. Additional solid textures were held in light of mentation and medical status. Pt had eructation throughout trials and when minimally reclined at the end she had delayed onset coughing, which could be concerning for potential esophageal component considering GI issues this admission. MD wants pt to remain on clear liquid diet from a medical standpoint, but could advance her up to Dys 1 solids and thin liquids from a swallowing standpoint when deemed medically appropriate. SLP will continue to follow. SLP Visit Diagnosis: Dysphagia, oral phase (R13.11)    Aspiration Risk  Mild aspiration risk;Moderate aspiration risk;Risk for inadequate nutrition/hydration    Diet Recommendation Dysphagia 1 (Puree);Thin liquid (when medically cleared by MD)   Liquid Administration via: Cup;Straw Medication Administration: Crushed with puree Supervision: Staff to assist with self feeding;Full supervision/cueing for compensatory strategies Compensations: Minimize environmental distractions;Slow rate;Small sips/bites Postural Changes: Seated upright at 90 degrees;Remain upright for at least 30 minutes after po intake    Other  Recommendations Oral Care Recommendations: Oral care BID   Follow up Recommendations Skilled Nursing facility      Frequency and Duration min 2x/week  2 weeks       Prognosis Prognosis for Safe Diet Advancement: Good Barriers to Reach Goals:  Cognitive deficits      Swallow Study   General HPI: Pt is an 80 yo female presenting with AMS and vomiting. CT of the abdomen was concerning for multifocal PNA and SBO, followed by general surgery and being treated conservatively with NGT for gastric decompression. Previous swallow evals (2019, 2020) were [facility] PMH includes: CVA (residual R  sided weakness), PD, CHF, anxiety Type of Study: Bedside Swallow Evaluation Previous Swallow Assessment: see HPI Diet Prior to this Study: Thin liquids (clear liquid diet) Temperature Spikes Noted: No Respiratory Status: Room air History of Recent Intubation: No Behavior/Cognition: Alert;Cooperative;Requires cueing Oral Cavity Assessment: Within Functional Limits Oral Care Completed by SLP: No Oral Cavity - Dentition: Edentulous Patient Positioning: Upright in bed Baseline Vocal Quality: Normal Volitional Cough: Cognitively unable to elicit Volitional Swallow: Unable to elicit    Oral/Motor/Sensory Function Overall Oral Motor/Sensory Function:  (cognitively not following commands well to assess, does seem to have mild R droop)   Ice Chips Ice chips: Not tested   Thin Liquid Thin Liquid: Impaired Presentation: Spoon;Straw Pharyngeal  Phase Impairments: Cough - Delayed    Nectar Thick Nectar Thick Liquid: Not tested   Honey Thick Honey Thick Liquid: Not tested   Puree Puree: Impaired Presentation: Spoon Pharyngeal Phase Impairments: Cough - Delayed   Solid            Mahala Menghini., M.A. CCC-SLP Acute Rehabilitation Services Pager [phone] Office [phone]  06/21/2020,10:13 AM

## 2020-06-21 NOTE — Progress Notes (Signed)
Initial Nutrition Assessment  DOCUMENTATION CODES:   Non-severe (moderate) malnutrition in context of chronic illness  INTERVENTION:   - Boost Breeze po TID with meals, each supplement provides 250 kcal and 9 grams of protein  - RD will monitor for diet advancement and adjust supplement regimen as appropriate  NUTRITION DIAGNOSIS:   Moderate Malnutrition related to chronic illness (dementia, CHF) as evidenced by moderate muscle depletion,moderate fat depletion.  GOAL:   Patient will meet greater than or equal to 90% of their needs  MONITOR:   PO intake,Supplement acceptance,Diet advancement,Labs,Weight trends,Skin,I & O's  REASON FOR ASSESSMENT:   NPO/Clear Liquid Diet    ASSESSMENT:   80 year old female who presented to the ED on 5/12 with AMS. PMH of atrial fibrillation, CHF, HTN, HLD, OA, CVA with residual right-sided weakness, dementia, Parkinson's disease. Pt admitted with concern for SBO and fecal impaction, AKI, and sepsis due to aspiration pneumonia.   5/13 - NGT placed to suction 5/16 - NGT removed 5/18 - clear liquid diet  Noted plan is for pt to return to SNF with hospice following. Per Surgery note, pt with small/large bowel ileus and no signs of mechanical obstruction at this time. Per Surgery, this appears to be a chronic problem.  Pt cleared by SLP for a dysphagia 1 diet with thin liquids. However, pt to remain on clear liquids at this time per MD.  Spoke with pt's daughter at bedside who reports that pt ate well at [facility]. Pt received feeding assistance with meals at SNF. Reviewed weight history in chart. Current weight is consistent with weight from January 2020. However, pt's weight has fluctuated between 73.9-78.4 kg since admission. Pt with edema to BLE. Suspect dry weight weight is closer to 74 kg.  Spoke with RN who reports that pt consumed ~15% of clear liquid lunch meal tray. RD to order clear liquid oral nutrition supplements while pt is on clear  liquid diet.  Medications reviewed and include: klor-con 40 mEq daily  Labs reviewed: potassium 3.0  NUTRITION - FOCUSED PHYSICAL EXAM:  Flowsheet Row Most Recent Value  Orbital Region Severe depletion  Upper Arm Region Moderate depletion  Thoracic and Lumbar Region Moderate depletion  Buccal Region Moderate depletion  Temple Region Severe depletion  Clavicle Bone Region Moderate depletion  Clavicle and Acromion Bone Region Moderate depletion  Scapular Bone Region Unable to assess  Dorsal Hand Moderate depletion  Patellar Region Moderate depletion  Anterior Thigh Region Moderate depletion  Posterior Calf Region Moderate depletion  Edema (RD Assessment) Mild  [BLE]  Hair Reviewed  Eyes Reviewed  Mouth Reviewed  Skin Reviewed  Nails Reviewed       Diet Order:   Diet Order            Diet clear liquid Room service appropriate? Yes; Fluid consistency: Thin  Diet effective now                 EDUCATION NEEDS:   No education needs have been identified at this time  Skin:  Skin Assessment: Skin Integrity Issues: Stage II: L buttock  Last BM:  06/21/20  Height:   Ht Readings from Last 1 Encounters:  06/15/20 5\' 2"  (1.575 m)    Weight:   Wt Readings from Last 1 Encounters:  06/21/20 78.4 kg    BMI:  Body mass index is 31.61 kg/m.  Estimated Nutritional Needs:   Kcal:  1550-1750  Protein:  75-90 grams  Fluid:  1.5-1.7 L    06/23/20  Vertell Limber, MS, RD, LDN Inpatient Clinical Dietitian Please see [facility] for contact information.

## 2020-06-21 NOTE — Progress Notes (Signed)
Patient had a swallow eval this am. Evaluator suggests medications be crushed in applesauce or similar consistency. Also, patient should remain in a high sitting position for at least 30 minutes after meals.

## 2020-06-21 NOTE — Plan of Care (Signed)
  Problem: Health Behavior/Discharge Planning: Goal: Ability to manage health-related needs will improve Outcome: Progressing   Problem: Clinical Measurements: Goal: Ability to maintain clinical measurements within normal limits will improve Outcome: Progressing Goal: Will remain free from infection Outcome: Progressing   Problem: Nutrition: Goal: Adequate nutrition will be maintained Outcome: Progressing   Problem: Pain Managment: Goal: General experience of comfort will improve Outcome: Progressing

## 2020-06-21 NOTE — Progress Notes (Signed)
Subjective/Chief Complaint: None  She has had no vomiting . She had a BM yesterday and one this morning. CT showed findings for small bowel and colonic ileus.  No evidence of volvulus.  Contrast in rectum noted.  Contrast actually throughout GI tract noted.  No signs of perforation, free air or other complicating feature.  No signs of bowel obstruction.  No signs of large bowel obstruction. Objective: Vital signs in last 24 hours: Temp:  [98.7 F (37.1 C)-99.6 F (37.6 C)] 99.1 F (37.3 C) (05/18 0300) Pulse Rate:  [63-76] 63 (05/18 0300) Resp:  [12-21] 12 (05/18 0300) BP: (113-147)/(62-85) 113/62 (05/18 0300) SpO2:  [92 %-94 %] 94 % (05/18 0300) Weight:  [78.4 kg] 78.4 kg (05/18 0300) Last BM Date: 06/19/20  Intake/Output from previous day: 05/17 0701 - 05/18 0700 In: 883.4 [I.V.:783.4; IV Piggyback:100] Out: 200 [Urine:200] Intake/Output this shift: No intake/output data recorded.   Gen: NAD, pleasantly demented Card: RRR Pulm:rate and effort normal Abd: Soft, abdomen is mildly distended with upper abdominal fullness,abdomen nontender to light and deep palpation Skin: warm and dry, no rashes Lab Results:  Recent Labs    06/19/20 0015 06/21/20 0150  WBC 13.1* 18.5*  HGB 13.4 12.5  HCT 42.2 37.8  PLT 167 160   BMET Recent Labs    06/20/20 0945 06/21/20 0150  NA 144 141  K 3.0* 3.0*  CL 114* 112*  CO2 21* 21*  GLUCOSE 137* 139*  BUN 14 10  CREATININE 1.00 0.93  CALCIUM 8.4* 8.4*   PT/INR No results for input(s): LABPROT, INR in the last 72 hours. ABG No results for input(s): PHART, HCO3 in the last 72 hours.  Invalid input(s): PCO2, PO2  Studies/Results: CT ABDOMEN PELVIS WO CONTRAST  Result Date: 06/20/2020 CLINICAL DATA:  Abdominal distension, small bowel obstruction. EXAM: CT ABDOMEN AND PELVIS WITHOUT CONTRAST TECHNIQUE: Multidetector CT imaging of the abdomen and pelvis was performed following the standard protocol without IV contrast.  COMPARISON:  06/15/2020 FINDINGS: Lower chest: Confluent mostly ground-glass airspace opacities in the right lower lobe, although the aeration in the right lower lobe is improved compared 06/15/2020. Elevated left hemidiaphragm. Trace right pleural effusion. Descending thoracic aortic atherosclerosis. Hepatobiliary: The gallbladder is only faintly seen and demonstrates a small amount of accentuated density dependently which could be from small stones or vicarious contrast excretion. Pancreas: Unremarkable Spleen: Dense rim calcified 5.4 cm in diameter splenic lesion with some stellate central calcification, as shown on prior exams back through the last 7 years. Adrenals/Urinary Tract: Thickened adrenal glands bilaterally. Stable mildly high density exophytic lesion from the left kidney lower pole measuring 1.2 by 0.9 cm with internal density of 59 Hounsfield units, nonspecific but probably a complex cyst. No hydronephrosis or hydroureter. No definite urinary tract calculi. Stomach/Bowel: Distal colonic fluid levels favoring diarrheal process. Orally administered contrast extends through to the rectum. Distended sigmoid colon extending into the left upper quadrant with some mild wall thickening in a narrow or region of sigmoid colon on image 32 series 6 also shown on image 26 series 3, nonspecific but possibly from local peristaltic activity given the smaller lumen. Redundant sigmoid colon noted. No overt twisting to indicate sigmoid volvulus. There is dense contrast medium in the terminal ileum. Scattered air-fluid levels in mildly prominent loops of small bowel. No significant transition in bowel caliber to suggest a lead point for obstruction. Vascular/Lymphatic: Aortoiliac atherosclerotic vascular disease. Right common iliac artery 2.6 cm in diameter. No pathologic adenopathy. Reproductive: Uterus absent.  Adnexa unremarkable. Other: Subcutaneous edema along the hips and pelvis. No definite ascites.  Musculoskeletal: Lumbar spondylosis and degenerative disc disease. Degenerative chondral thinning in both hips. Bony demineralization. IMPRESSION: 1. Distal colonic air-fluid levels with prominence of small and large bowel loops with air-fluid levels favoring ileus over obstruction. 2. Distended sigmoid colon is notable in extends towards the left upper quadrant without volvulus identified. There is some localized wall thickening in the left upper quadrant sigmoid colon probably from local peristaltic activity rather than inflammation or mass. 3. Improved atelectasis but increased ground-glass opacities in the right lower lobe, suspicious for pneumonia or alveolitis. 4. Trace right pleural effusion. 5. Elevated left hemidiaphragm. 6. Chronically stable dense 5.4 cm rim calcified splenic lesion, probably benign. 7. Lumbar spondylosis and degenerative disc disease. Bilateral degenerative hip arthropathy. Electronically Signed   By: Gaylyn Rong M.D.   On: 06/20/2020 19:10   DG Abd Portable 1V  Result Date: 06/20/2020 CLINICAL DATA:  Small bowel obstruction EXAM: PORTABLE ABDOMEN - 1 VIEW COMPARISON:  06/18/2020 FINDINGS: Dilated bowel loops in the abdomen and pelvis. Markedly dilated central bowel loops measures up to 15.7 cm may reflect the sigmoid colon. This is worsened since prior study. Contrast seen within the right colon and rectum. No free air organomegaly. Rim calcified splenic mass again noted. IMPRESSION: Dilated bowel loops throughout the abdomen and pelvis with increasing sigmoid distention measuring up to 15.7 cm. Cannot completely exclude sigmoid volvulus. Electronically Signed   By: Charlett Nose M.D.   On: 06/20/2020 09:01    Anti-infectives: Anti-infectives (From admission, onward)   Start     Dose/Rate Route Frequency Ordered Stop   06/18/20 1000  Ampicillin-Sulbactam (UNASYN) 3 g in sodium chloride 0.9 % 100 mL IVPB        3 g 200 mL/hr over 30 Minutes Intravenous Every 8 hours  06/18/20 0947     06/16/20 1200  Ampicillin-Sulbactam (UNASYN) 3 g in sodium chloride 0.9 % 100 mL IVPB  Status:  Discontinued        3 g 200 mL/hr over 30 Minutes Intravenous Every 12 hours 06/16/20 1153 06/18/20 0947   06/15/20 2100  Ampicillin-Sulbactam (UNASYN) 3 g in sodium chloride 0.9 % 100 mL IVPB        3 g 200 mL/hr over 30 Minutes Intravenous  Once 06/15/20 2009 06/15/20 2204   06/15/20 2100  vancomycin (VANCOREADY) IVPB 1000 mg/200 mL        1,000 mg 200 mL/hr over 60 Minutes Intravenous  Once 06/15/20 2009 06/16/20 0023      Assessment/Plan: hypertension paroxysmal atrial fibrillation 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  This appears to be a chronic problem more than likely due to her debilitated state.  No role for surgery at this point.  May benefit from GI consultation for bowel regiment and/or management or consideration of palliative decompression colonoscopy.  At this point time, she has no signs of obstruction and can be fed.  Recommend palliative care consultation for goals of care at this point in time.  LOS: 5 days    Clovis Pu Terin Cragle 06/21/2020

## 2020-06-21 NOTE — Progress Notes (Signed)
PROGRESS NOTE  [name] XFG:[id number] DOB: November 03, 1940 DOA: 06/15/2020 PCP: Javier Glazier, MD   LOS: 5 days   Brief Narrative / Interim history: 80 year old female with HTN, PAF, diastolic CHF last EF 96-78%, prior CVA with residual right-sided weakness, dementia comes in from her nursing facility being acutely altered.  At baseline she is able to recognize family members, answering yes and no questions but over the last couple of days she has been poorly responsive and has had several episodes of vomiting.  She has been more lethargic.  She was brought to the hospital and ED she was found to be hypotensive, also CT of the abdomen and pelvis showed multiple distended loops of bowel concerning for small bowel obstruction.  Imaging also was concerning for multifocal pneumonia.  General surgery was consulted, she had an NG tube placed and she was placed on antibiotic for aspiration pneumonia.  She was found to have A. fib with RVR with wide-complex tachycardia and cardiology was consulted and she was placed on amiodarone drip.  Given poor baseline and multiple medical problems palliative care was consulted as well  Subjective / 24h Interval events: Alert, underlying dementia  Assessment & Plan: Principal Problem Small bowel obstruction -conservative management for now, appreciate general surgery follow-up.  She has started having bowel movements, NG tube output was minimal and it was eventually discontinued on 5/16.  Due to persistent small bowel obstruction on x-ray General surgery still recommending n.p.o. however cleared for feeding today 5/17 -Obtain speech eval given profound dementia, clear liquid diet for now and obtain speech therapy to see whether she needs full liquids thickened  Active Problems Severe sepsis due to aspiration pneumonia-met criteria with hypotension, tachycardia, as well as elevated lactic acid.  She was fluid resuscitated and blood pressure has  improved. -Continue fluids while n.p.o. -Continue Unasyn, today day #6, procalcitonin was elevated.  Plan to total of 7 days, if she is tolerating p.o. this can be converted to Augmentin  PAF, A. fib with RVR -patient has a history of paroxysmal A. fib developed RVR in the ED in the setting of pneumonia, dehydration.  Cardiology consulted -Continue amiodarone, 2D echo done shows EF 60 to 65% -Not anticoagulated due to concern for high risk of brain bleed. -Switch to oral amiodarone when cleared by speech and able to tolerate oral intake  Hypokalemia-due to losses, continue to replete  Hypernatremia-change fluids to D5W on 5/16, sodium this morning pending  Age indeterminate DVT - involving the right common femoral vein, SF junction, right femoral vein, right proximal profunda vein, right popliteal vein, and right posterior tibial veins.  Hold anticoagulation due to high risk of brain bleed.  Discussed this with daughter, she is in agreement  Acute hypoxic respiratory failure -multifactorial in the setting of aspiration pneumonia, small right-sided effusion.  Provide supplemental oxygen, monitor closely fluid status  Chronic microhemorrhages within the supratentorial brain -progressive, discussed with neurology over the phone, given concern for amyloid there is a fairly decent chance of bleed.  Hold anticoagulation, discussed with the daughter at bedside on 9/38  Chronic diastolic CHF -slight lower extremity edema, suspect chronic.  Monitor fluid status closely.  BNP elevated at 274  Acute kidney injury -likely in the setting of sepsis, dehydration due to vomiting secondary to bowel obstruction.  Creatinine normal  Coronary artery disease -no chest pain, stable.  Negative high-sensitivity troponin  Goals of care-family confirms DNR but want full medical care at this point.  She will return  home to her SNF with hospice following.  Now with NG tube is out, will advance diet per general surgery.   When she is able to tolerate a little bit of eating as well as her oral medications could potentially go back to SNF with hospice following  Scheduled Meds: . enoxaparin (LOVENOX) injection  40 mg Subcutaneous Q24H  . metoprolol tartrate  2.5 mg Intravenous Q6H   Continuous Infusions: . amiodarone 30 mg/hr (06/21/20 0402)  . ampicillin-sulbactam (UNASYN) IV 3 g (06/21/20 0303)  . dextrose 5 % with KCl 20 mEq / L     PRN Meds:.acetaminophen **OR** acetaminophen, albuterol  Diet Orders (From admission, onward)    Start     Ordered   06/21/20 0829  Diet clear liquid Room service appropriate? Yes; Fluid consistency: Thin  Diet effective now       Question Answer Comment  Room service appropriate? Yes   Fluid consistency: Thin      06/21/20 0828        DVT prophylaxis: enoxaparin (LOVENOX) injection 40 mg Start: 06/20/20 0900     Code Status: DNR  Family Communication: No family at bedside this morning  Status is: Inpatient  Remains inpatient appropriate because:Inpatient level of care appropriate due to severity of illness   Dispo: The patient is from: SNF              Anticipated d/c is to: SNF              Patient currently is not medically stable to d/c.   Difficult to place patient No  Level of care: Progressive  Consultants:  General surgery Cardiology Critical care Palliative care  Procedures:  2D echo:  1. Techincally difficult study, very limited views  2. Left ventricular ejection fraction, by estimation, is 60 to 65%. The left ventricle has normal function. Left ventricular endocardial border not optimally defined to evaluate regional wall motion.  3. Right ventricular systolic function was not well visualized. The right ventricular size is not well visualized.  4. A small pericardial effusion is present   Microbiology  none  Antimicrobials: Unasyn 5/13 >>    Objective: Vitals:   06/20/20 1900 06/20/20 2300 06/21/20 0300 06/21/20 0801  BP:  (!) 141/79 (!) 147/80 113/62 127/68  Pulse: 74 74 63 64  Resp: 15 (!) '21 12 17  ' Temp: 99.1 F (37.3 C) 99 F (37.2 C) 99.1 F (37.3 C) 98.3 F (36.8 C)  TempSrc: Oral Oral Oral Oral  SpO2: 93% 94% 94% 94%  Weight:   78.4 kg   Height:        Intake/Output Summary (Last 24 hours) at 06/21/2020 4496 Last data filed at 06/21/2020 7591 Gross per 24 hour  Intake 866.69 ml  Output 200 ml  Net 666.69 ml   Filed Weights   06/18/20 0407 06/20/20 0300 06/21/20 0300  Weight: 73.9 kg 75.5 kg 78.4 kg    Examination:  Constitutional: NAD, in bed Eyes: No scleral icterus ENMT: mmm Neck: normal, supple Respiratory: Clear bilaterally, no wheezing heard Cardiovascular: Irregular, no murmurs, mucous edema Abdomen: Soft, NT, ND, bowel sounds positive Musculoskeletal: no clubbing / cyanosis.  Skin: No rashes seen Neurologic: No focal deficits  Data Reviewed: I have independently reviewed following labs and imaging studies   CBC: Recent Labs  Lab 06/15/20 1410 06/16/20 2145 06/17/20 0434 06/17/20 1731 06/18/20 0806 06/19/20 0015 06/21/20 0150  WBC 8.5   < > 26.6* 13.9* 13.6* 13.1* 18.5*  NEUTROABS 6.2  --   --   --   --   --   --  HGB 16.2*   < > 14.2 12.9 13.3 13.4 12.5  HCT 52.0*   < > 50.0* 41.6 42.4 42.2 37.8  MCV 84.8   < > 91.9 84.6 85.0 81.3 81.5  PLT 253   < > 160 146* 134* 167 160   < > = values in this interval not displayed.   Basic Metabolic Panel: Recent Labs  Lab 06/16/20 2312 06/17/20 1731 06/18/20 0806 06/19/20 0015 06/20/20 0945 06/21/20 0150  NA 144 146* 147* 149* 144 141  K 3.4* 3.8 3.6 3.5 3.0* 3.0*  CL 113* 114* 118* 117* 114* 112*  CO2 21* 22 19* 23 21* 21*  GLUCOSE 109* 80 88 83 137* 139*  BUN 49* 35* 27* '19 14 10  ' CREATININE 1.78* 1.28* 1.03* 1.03* 1.00 0.93  CALCIUM 7.4* 7.9* 8.2* 8.5* 8.4* 8.4*  MG 1.9  --  2.5*  --   --  2.2  PHOS  --   --  1.7*  --   --   --    Liver Function Tests: Recent Labs  Lab 06/15/20 1656 06/16/20 1836  06/16/20 2312 06/17/20 1731 06/18/20 0806  AST 19 14* '18 21 20  ' ALT '15 7 14 14 14  ' ALKPHOS 81 24* 59 66 70  BILITOT 1.8* 0.5 1.2 1.0 0.8  PROT 6.4* <3.0* 5.4* 5.6* 5.6*  ALBUMIN 3.4* <1.0* 2.5* 2.5* 2.4*   Coagulation Profile: No results for input(s): INR, PROTIME in the last 168 hours. HbA1C: No results for input(s): HGBA1C in the last 72 hours. CBG: Recent Labs  Lab 06/15/20 1920  GLUCAP 150*    Recent Results (from the past 240 hour(s))  Urine culture     Status: Abnormal   Collection Time: 06/15/20  2:32 PM   Specimen: Urine, Random  Result Value Ref Range Status   Specimen Description URINE, RANDOM  Final   Special Requests   Final    NONE Performed at [facility], [address] [place], [place] [address]    Culture MULTIPLE SPECIES PRESENT, SUGGEST RECOLLECTION (A)  Final   Report Status 06/17/2020 FINAL  Final  SARS CORONAVIRUS 2 (TAT 6-24 HRS) Nasopharyngeal Urine, Clean Catch     Status: None   Collection Time: 06/15/20  7:24 PM   Specimen: Urine, Clean Catch; Nasopharyngeal  Result Value Ref Range Status   SARS Coronavirus 2 NEGATIVE NEGATIVE Final    Comment: (NOTE) SARS-CoV-2 target nucleic acids are NOT DETECTED.  The SARS-CoV-2 RNA is generally detectable in upper and lower respiratory specimens during the acute phase of infection. Negative results do not preclude SARS-CoV-2 infection, do not rule out co-infections with other pathogens, and should not be used as the sole basis for treatment or other patient management decisions. Negative results must be combined with clinical observations, patient history, and epidemiological information. The expected result is Negative.  Fact Sheet for Patients: SugarRoll.be  Fact Sheet for Healthcare Providers: https://www.woods-mathews.com/  This test is not yet approved or cleared by the [place] FDA and  has been authorized for detection and/or diagnosis  of SARS-CoV-2 by FDA under an Emergency Use Authorization (EUA). This EUA will remain  in effect (meaning this test can be used) for the duration of the COVID-19 declaration under Se ction 564(b)(1) of the Act, 21 U.S.C. section 360bbb-3(b)(1), unless the authorization is terminated or revoked sooner.  Performed at [facility], [place] [address] [place], [place] [address]   MRSA PCR Screening     Status: None  Collection Time: 06/17/20  2:39 PM   Specimen: Nasopharyngeal  Result Value Ref Range Status   MRSA by PCR NEGATIVE NEGATIVE Final    Comment:        The GeneXpert MRSA Assay (FDA approved for NASAL specimens only), is one component of a comprehensive MRSA colonization surveillance program. It is not intended to diagnose MRSA infection nor to guide or monitor treatment for MRSA infections. Performed at [facility], [place] [address] [place], [place] [address]      Radiology Studies: CT ABDOMEN PELVIS WO CONTRAST  Result Date: 06/20/2020 CLINICAL DATA:  Abdominal distension, small bowel obstruction. EXAM: CT ABDOMEN AND PELVIS WITHOUT CONTRAST TECHNIQUE: Multidetector CT imaging of the abdomen and pelvis was performed following the standard protocol without IV contrast. COMPARISON:  06/15/2020 FINDINGS: Lower chest: Confluent mostly ground-glass airspace opacities in the right lower lobe, although the aeration in the right lower lobe is improved compared 06/15/2020. Elevated left hemidiaphragm. Trace right pleural effusion. Descending thoracic aortic atherosclerosis. Hepatobiliary: The gallbladder is only faintly seen and demonstrates a small amount of accentuated density dependently which could be from small stones or vicarious contrast excretion. Pancreas: Unremarkable Spleen: Dense rim calcified 5.4 cm in diameter splenic lesion with some stellate central calcification, as shown on prior exams back through the last 7 years. Adrenals/Urinary Tract: Thickened adrenal  glands bilaterally. Stable mildly high density exophytic lesion from the left kidney lower pole measuring 1.2 by 0.9 cm with internal density of 59 Hounsfield units, nonspecific but probably a complex cyst. No hydronephrosis or hydroureter. No definite urinary tract calculi. Stomach/Bowel: Distal colonic fluid levels favoring diarrheal process. Orally administered contrast extends through to the rectum. Distended sigmoid colon extending into the left upper quadrant with some mild wall thickening in a narrow or region of sigmoid colon on image 32 series 6 also shown on image 26 series 3, nonspecific but possibly from local peristaltic activity given the smaller lumen. Redundant sigmoid colon noted. No overt twisting to indicate sigmoid volvulus. There is dense contrast medium in the terminal ileum. Scattered air-fluid levels in mildly prominent loops of small bowel. No significant transition in bowel caliber to suggest a lead point for obstruction. Vascular/Lymphatic: Aortoiliac atherosclerotic vascular disease. Right common iliac artery 2.6 cm in diameter. No pathologic adenopathy. Reproductive: Uterus absent.  Adnexa unremarkable. Other: Subcutaneous edema along the hips and pelvis. No definite ascites. Musculoskeletal: Lumbar spondylosis and degenerative disc disease. Degenerative chondral thinning in both hips. Bony demineralization. IMPRESSION: 1. Distal colonic air-fluid levels with prominence of small and large bowel loops with air-fluid levels favoring ileus over obstruction. 2. Distended sigmoid colon is notable in extends towards the left upper quadrant without volvulus identified. There is some localized wall thickening in the left upper quadrant sigmoid colon probably from local peristaltic activity rather than inflammation or mass. 3. Improved atelectasis but increased ground-glass opacities in the right lower lobe, suspicious for pneumonia or alveolitis. 4. Trace right pleural effusion. 5. Elevated left  hemidiaphragm. 6. Chronically stable dense 5.4 cm rim calcified splenic lesion, probably benign. 7. Lumbar spondylosis and degenerative disc disease. Bilateral degenerative hip arthropathy. Electronically Signed   By: Van Clines M.D.   On: 06/20/2020 19:10   Marzetta Board, MD, PhD [facility]  Between 7 am - 7 pm I am available, please contact me via Amion (for emergencies) or Securechat (non urgent messages)  Between 7 pm - 7 am I am not available, please contact night coverage MD/APP via Amion

## 2020-06-22 DIAGNOSIS — E440 Moderate protein-calorie malnutrition: Secondary | ICD-10-CM | POA: Insufficient documentation

## 2020-06-22 LAB — BASIC METABOLIC PANEL
Anion gap: 8 (ref 5–15)
BUN: 10 mg/dL (ref 8–23)
CO2: 22 mmol/L (ref 22–32)
Calcium: 8.5 mg/dL — ABNORMAL LOW (ref 8.9–10.3)
Chloride: 111 mmol/L (ref 98–111)
Creatinine, Ser: 0.94 mg/dL (ref 0.44–1.00)
GFR, Estimated: >60 mL/min (ref >60)
Glucose, Bld: 107 mg/dL — ABNORMAL HIGH (ref 70–99)
Potassium: 3 mmol/L — ABNORMAL LOW (ref 3.5–5.1)
Sodium: 141 mmol/L (ref 135–145)

## 2020-06-22 LAB — CBC
HCT: 37.4 % (ref 36.0–46.0)
Hemoglobin: 12.1 g/dL (ref 12.0–15.0)
MCH: 25.9 pg — ABNORMAL LOW (ref 26.0–34.0)
MCHC: 32.4 g/dL (ref 30.0–36.0)
MCV: 79.9 fL — ABNORMAL LOW (ref 80.0–100.0)
Platelets: 187 10*3/uL (ref 150–400)
RBC: 4.68 MIL/uL (ref 3.87–5.11)
RDW: 16 % — ABNORMAL HIGH (ref 11.5–15.5)
WBC: 16.2 10*3/uL — ABNORMAL HIGH (ref 4.0–10.5)
nRBC: 0 % (ref 0.0–0.2)

## 2020-06-22 MED ORDER — POTASSIUM CHLORIDE 20 MEQ PO PACK
40.0000 meq | PACK | Freq: Once | ORAL | Status: DC
  Filled 2020-06-22: qty 2

## 2020-06-22 NOTE — Plan of Care (Signed)
  Problem: Clinical Measurements: Goal: Ability to maintain clinical measurements within normal limits will improve Outcome: Progressing Goal: Will remain free from infection Outcome: Progressing   Problem: Nutrition: Goal: Adequate nutrition will be maintained Outcome: Progressing   Problem: Pain Managment: Goal: General experience of comfort will improve Outcome: Progressing   Problem: Skin Integrity: Goal: Risk for impaired skin integrity will decrease Outcome: Progressing   

## 2020-06-22 NOTE — Plan of Care (Signed)

## 2020-06-22 NOTE — Progress Notes (Signed)
  Speech Language Pathology Treatment: Dysphagia  Patient Details Name: [name] MRN: [id number] DOB: 12-08-1940 Today's Date: 06/22/2020 Time: 4098-1191 SLP Time Calculation (min) (ACUTE ONLY): 11 min  Assessment / Plan / Recommendation Clinical Impression  Pt was seen for f/u with no concerns reported by nursing regarding current CLD. Pt's intake is limited by mentation, also not liking the taste of the liquid medications, so trials were administered only as pt was accepting. Oral holding is noted with purees more than solids, and she needs Mod A to facilitate this and bolus awareness throughout trials. Dys 1 diet and thin liquids still seem most appropriate from a swallowing standpoint. Per note from surgery this morning, "At this point time, she has no signs of obstruction and can have diet advancement per speech." Therefore, will upgrade to this diet.    HPI HPI: Pt is an 80 yo female presenting with AMS and vomiting. CT of the abdomen was concerning for multifocal PNA and SBO, followed by general surgery and being treated conservatively with NGT for gastric decompression. Previous swallow evals (2019, 2020) were [facility]. PMH includes: CVA (residual R sided weakness), PD, CHF, anxiety      SLP Plan  Continue with current plan of care       Recommendations  Diet recommendations: Dysphagia 1 (puree);Thin liquid Liquids provided via: Cup;Straw Medication Administration: Crushed with puree Supervision: Staff to assist with self feeding Compensations: Minimize environmental distractions;Slow rate;Small sips/bites Postural Changes and/or Swallow Maneuvers: Seated upright 90 degrees;Upright 30-60 min after meal                Oral Care Recommendations: Oral care BID Follow up Recommendations: Skilled Nursing facility SLP Visit Diagnosis: Dysphagia, oral phase (R13.11) Plan: Continue with current plan of care       GO                 Mahala Menghini., M.A. CCC-SLP Acute  Rehabilitation Services Pager [phone] Office [phone]  06/22/2020, 9:22 AM

## 2020-06-22 NOTE — TOC Progression Note (Signed)
Transition of Care [facility]) - Progression Note    Patient Details  Name: [name] MRN: [id number] Date of Birth: Apr 28, 1940  Transition of Care [facility]) CM/SW Contact  Ivette Loyal, [place] Phone Number: 06/22/2020, 4:04 PM  Clinical Narrative:    1550: CSW spoke with Autumn Messing, Pt daughter and Palliative at [facility] to confirm pt DC back to Autumn Messing with Hospice, specifically [facility]. The daughter is aware and wants CSW to provide updates on DC tomorrow. CSW will follow up with daughter, palliative was not available and VM was full. CSW secure chatted to follow up with palliative and will contact again tomorrow to confirm Hospice has been set up with [facility] before DC'ing. [facility] is aware, on board, and ready to accept pt back.   Expected Discharge Plan: Skilled Nursing Facility Barriers to Discharge: Continued Medical Work up,Transportation,SNF Pending discharge orders  Expected Discharge Plan and Services Expected Discharge Plan: Skilled Nursing Facility     Post Acute Care Choice: Skilled Nursing Facility Living arrangements for the past 2 months: Skilled Nursing Facility                                       Social Determinants of Health (SDOH) Interventions    Readmission Risk Interventions No flowsheet data found.

## 2020-06-22 NOTE — Progress Notes (Signed)
PROGRESS NOTE    [name]  MGN:[id number] DOB: 11/16/1940 DOA: 06/15/2020 PCP: Javier Glazier, MD    Brief Narrative:  This 80 year old female with HTN, PAF, diastolic CHF last EF 91-69%, prior CVA with residual right-sided weakness, dementia sent from his skilled nursing facility for altered mental status. At baseline she is able to recognize family members, answering yes and no questions but over the last couple of days, she has been poorly responsive and has had several episodes of vomiting.  She has been more lethargic.  She was brought to the ED.  she was found to be hypotensive, CT of the abdomen and pelvis showed multiple distended loops of bowel concerning for small bowel obstruction.  Imaging also was concerning for multifocal pneumonia.  General surgery was consulted, she had an NG tube placed and she was placed on antibiotics for aspiration pneumonia.  She was found to have A. fib with RVR with wide-complex tachycardia and cardiology was consulted and she was placed on amiodarone drip.  Given poor baseline and multiple medical problems palliative care was consulted as well.  Assessment & Plan:   Principal Problem:   SBO (small bowel obstruction) (HCC) Active Problems:   Paroxysmal atrial fibrillation (HCC)   AKI (acute kidney injury) ([place])   Acute respiratory failure with hypoxia (HCC)   Dementia (HCC)   Acute metabolic encephalopathy   Aspiration pneumonia (HCC)   Right leg DVT (HCC)   DNR (do not resuscitate)   Malnutrition of moderate degree  Principal Problem Small bowel obstruction > Improving Continue conservative management , appreciate general surgery follow-up.   She has started having bowel movements, NG tube was removed on 5/16.   She is started on clear liquid diet, tolerating well. Speech and swallow evaluation recommended dysphagia 1 diet.  Active Problems Severe sepsis due to aspiration pneumonia: She met criteria with hypotension, tachycardia,  as well as elevated lactic acid.   She was fluid resuscitated and blood pressure has improved. Continue Unasyn, today day # 7, procalcitonin was elevated.   Plan to total of 7 days, antibiotic changed to Augmentin. Sepsis physiology improving.  PAF, A. fib with RVR : She has  history of paroxysmal A. fib developed RVR in the ED in the setting of pneumonia, dehydration.   Cardiology consulted. HR is now controlled. Continue amiodarone, 2D echo showed EF 60 to 65% Not anticoagulated due to concerns for high risk of brain bleed. Switch to oral amiodarone when cleared by speech and able to tolerate oral intake  Hypokalemia: Due to losses, continue to replete.  Hypernatremia : Resolved  Age indeterminate DVT : Patient has DVT noted in right common femoral vein, SF junction, right femoral vein, right proximal profunda vein, right popliteal vein, and right posterior tibial veins.  Hold anticoagulation due to high risk of brain bleeding.  Discussed this with daughter, she is in agreement.  Acute hypoxic respiratory failure : > Improved multifactorial in the setting of aspiration pneumonia, small right-sided effusion.   Continue  supplemental oxygen, monitor closely fluid status.  Chronic microhemorrhages within the supratentorial brain : It was discussed with neurology over the phone, given concern for amyloid there is a fairly decent chance of bleed.   Hold anticoagulation, discussed with the daughter at bedside on 5/14.  Chronic diastolic CHF : Not decompensated. Slight lower extremity edema, suspect chronic.   Monitor fluid status closely.  BNP elevated at 274  Acute kidney injury > Resolved. likely in the setting of sepsis, dehydration  due to vomiting secondary to bowel obstruction.   Creatinine normal  Coronary artery disease : Denies chest pain, stable.  Negative high-sensitivity troponin  Goals of care: family confirms DNR but want full medical care at this point.   She will return home to her SNF with hospice following.  Now with NG tube is out, will advance diet per general surgery.  When she is able to tolerate a little bit of eating as well as her oral medications could potentially go back to SNF with hospice following.    DVT prophylaxis: Lovenox. Code Status: DNR Family Communication: No family at bed side. Disposition Plan:  Status is: Inpatient  Remains inpatient appropriate because:Inpatient level of care appropriate due to severity of illness   Dispo: The patient is from: SNF              Anticipated d/c is to: SNF              Patient currently is not medically stable to d/c.   Difficult to place patient No  Consultants:   Cardiology  General surgery  PCCM  Palliative care  Procedures:  ECHO Antimicrobials: Anti-infectives (From admission, onward)   Start     Dose/Rate Route Frequency Ordered Stop   06/21/20 1315  amoxicillin-clavulanate (AUGMENTIN) 875-125 MG per tablet 1 tablet        1 tablet Oral Every 12 hours 06/21/20 1219     06/18/20 1000  Ampicillin-Sulbactam (UNASYN) 3 g in sodium chloride 0.9 % 100 mL IVPB  Status:  Discontinued        3 g 200 mL/hr over 30 Minutes Intravenous Every 8 hours 06/18/20 0947 06/21/20 1219   06/16/20 1200  Ampicillin-Sulbactam (UNASYN) 3 g in sodium chloride 0.9 % 100 mL IVPB  Status:  Discontinued        3 g 200 mL/hr over 30 Minutes Intravenous Every 12 hours 06/16/20 1153 06/18/20 0947   06/15/20 2100  Ampicillin-Sulbactam (UNASYN) 3 g in sodium chloride 0.9 % 100 mL IVPB        3 g 200 mL/hr over 30 Minutes Intravenous  Once 06/15/20 2009 06/15/20 2204   06/15/20 2100  vancomycin (VANCOREADY) IVPB 1000 mg/200 mL        1,000 mg 200 mL/hr over 60 Minutes Intravenous  Once 06/15/20 2009 06/16/20 0023       Subjective: Patient was seen and examined at bedside.  Overnight events noted.  Patient seems at her baseline mental status.  Patient is alert,  following commands.  She  is lying comfortably,  she has good bowel movement today.  Objective: Vitals:   06/21/20 2328 06/22/20 0300 06/22/20 0313 06/22/20 0805  BP: 125/76  120/73 112/73  Pulse: 71 64 66 68  Resp: '18 16 16 16  ' Temp: 99.9 F (37.7 C)  98.6 F (37 C) 98.4 F (36.9 C)  TempSrc: Axillary  Axillary Oral  SpO2: 95% 91% 94% 91%  Weight:   77.6 kg   Height:        Intake/Output Summary (Last 24 hours) at 06/22/2020 1127 Last data filed at 06/22/2020 0600 Gross per 24 hour  Intake 100 ml  Output 100 ml  Net 0 ml   Filed Weights   06/20/20 0300 06/21/20 0300 06/22/20 0313  Weight: 75.5 kg 78.4 kg 77.6 kg    Examination:  General exam: Appears calm and comfortable, not in any acute distress. Respiratory system: Clear to auscultation. Respiratory effort normal. Cardiovascular system: S1 & S2  heard, Irregular rhythm.  No JVD, murmurs, rubs, gallops or clicks.  Both legs swollen. Gastrointestinal system: Abdomen is nondistended, soft and nontender. No organomegaly or masses felt. Normal bowel sounds heard. Central nervous system: Alert and oriented. No focal neurological deficits. Extremities: Both legs swollen, no cyanosis, no clubbing, no edema. Skin: No rashes, lesions or ulcers Psychiatry: Judgement and insight appear normal. Mood & affect appropriate.     Data Reviewed: I have personally reviewed following labs and imaging studies  CBC: Recent Labs  Lab 06/15/20 1410 06/16/20 2145 06/17/20 1731 06/18/20 0806 06/19/20 0015 06/21/20 0150 06/22/20 0021  WBC 8.5   < > 13.9* 13.6* 13.1* 18.5* 16.2*  NEUTROABS 6.2  --   --   --   --   --   --   HGB 16.2*   < > 12.9 13.3 13.4 12.5 12.1  HCT 52.0*   < > 41.6 42.4 42.2 37.8 37.4  MCV 84.8   < > 84.6 85.0 81.3 81.5 79.9*  PLT 253   < > 146* 134* 167 160 187   < > = values in this interval not displayed.   Basic Metabolic Panel: Recent Labs  Lab 06/16/20 2312 06/17/20 1731 06/18/20 0806 06/19/20 0015 06/20/20 0945  06/21/20 0150 06/22/20 0021  NA 144   < > 147* 149* 144 141 141  K 3.4*   < > 3.6 3.5 3.0* 3.0* 3.0*  CL 113*   < > 118* 117* 114* 112* 111  CO2 21*   < > 19* 23 21* 21* 22  GLUCOSE 109*   < > 88 83 137* 139* 107*  BUN 49*   < > 27* '19 14 10 10  ' CREATININE 1.78*   < > 1.03* 1.03* 1.00 0.93 0.94  CALCIUM 7.4*   < > 8.2* 8.5* 8.4* 8.4* 8.5*  MG 1.9  --  2.5*  --   --  2.2  --   PHOS  --   --  1.7*  --   --   --   --    < > = values in this interval not displayed.   GFR: Estimated Creatinine Clearance: 44.5 mL/min (by C-G formula based on SCr of 0.94 mg/dL). Liver Function Tests: Recent Labs  Lab 06/15/20 1656 06/16/20 1836 06/16/20 2312 06/17/20 1731 06/18/20 0806  AST 19 14* '18 21 20  ' ALT '15 7 14 14 14  ' ALKPHOS 81 24* 59 66 70  BILITOT 1.8* 0.5 1.2 1.0 0.8  PROT 6.4* <3.0* 5.4* 5.6* 5.6*  ALBUMIN 3.4* <1.0* 2.5* 2.5* 2.4*   No results for input(s): LIPASE, AMYLASE in the last 168 hours. Recent Labs  Lab 06/15/20 1656  AMMONIA 35   Coagulation Profile: No results for input(s): INR, PROTIME in the last 168 hours. Cardiac Enzymes: No results for input(s): CKTOTAL, CKMB, CKMBINDEX, TROPONINI in the last 168 hours. BNP (last 3 results) No results for input(s): PROBNP in the last 8760 hours. HbA1C: No results for input(s): HGBA1C in the last 72 hours. CBG: Recent Labs  Lab 06/15/20 1920  GLUCAP 150*   Lipid Profile: No results for input(s): CHOL, HDL, LDLCALC, TRIG, CHOLHDL, LDLDIRECT in the last 72 hours. Thyroid Function Tests: No results for input(s): TSH, T4TOTAL, FREET4, T3FREE, THYROIDAB in the last 72 hours. Anemia Panel: No results for input(s): VITAMINB12, FOLATE, FERRITIN, TIBC, IRON, RETICCTPCT in the last 72 hours. Sepsis Labs: Recent Labs  Lab 06/15/20 1656 06/16/20 1836 06/16/20 2312 06/16/20 2313 06/17/20 0434  PROCALCITON  --   --  10.00  --   --   LATICACIDVEN 2.4* 2.5*  --  1.8 1.9    Recent Results (from the past 240 hour(s))  Urine  culture     Status: Abnormal   Collection Time: 06/15/20  2:32 PM   Specimen: Urine, Random  Result Value Ref Range Status   Specimen Description URINE, RANDOM  Final   Special Requests   Final    NONE Performed at [facility], [address], [place], [place] [address]    Culture MULTIPLE SPECIES PRESENT, SUGGEST RECOLLECTION (A)  Final   Report Status 06/17/2020 FINAL  Final  SARS CORONAVIRUS 2 (TAT 6-24 HRS) Nasopharyngeal Urine, Clean Catch     Status: None   Collection Time: 06/15/20  7:24 PM   Specimen: Urine, Clean Catch; Nasopharyngeal  Result Value Ref Range Status   SARS Coronavirus 2 NEGATIVE NEGATIVE Final    Comment: (NOTE) SARS-CoV-2 target nucleic acids are NOT DETECTED.  The SARS-CoV-2 RNA is generally detectable in upper and lower respiratory specimens during the acute phase of infection. Negative results do not preclude SARS-CoV-2 infection, do not rule out co-infections with other pathogens, and should not be used as the sole basis for treatment or other patient management decisions. Negative results must be combined with clinical observations, patient history, and epidemiological information. The expected result is Negative.  Fact Sheet for Patients: SugarRoll.be  Fact Sheet for Healthcare Providers: https://www.woods-mathews.com/  This test is not yet approved or cleared by the [place] FDA and  has been authorized for detection and/or diagnosis of SARS-CoV-2 by FDA under an Emergency Use Authorization (EUA). This EUA will remain  in effect (meaning this test can be used) for the duration of the COVID-19 declaration under Se ction 564(b)(1) of the Act, 21 U.S.C. section 360bbb-3(b)(1), unless the authorization is terminated or revoked sooner.  Performed at [facility], [place] [address], [place], [place] [address]   MRSA PCR Screening     Status: None   Collection Time: 06/17/20  2:39 PM    Specimen: Nasopharyngeal  Result Value Ref Range Status   MRSA by PCR NEGATIVE NEGATIVE Final    Comment:        The GeneXpert MRSA Assay (FDA approved for NASAL specimens only), is one component of a comprehensive MRSA colonization surveillance program. It is not intended to diagnose MRSA infection nor to guide or monitor treatment for MRSA infections. Performed at [facility], [place] [address] [place],  [address]     Radiology Studies: CT ABDOMEN PELVIS WO CONTRAST  Result Date: 06/20/2020 CLINICAL DATA:  Abdominal distension, small bowel obstruction. EXAM: CT ABDOMEN AND PELVIS WITHOUT CONTRAST TECHNIQUE: Multidetector CT imaging of the abdomen and pelvis was performed following the standard protocol without IV contrast. COMPARISON:  06/15/2020 FINDINGS: Lower chest: Confluent mostly ground-glass airspace opacities in the right lower lobe, although the aeration in the right lower lobe is improved compared 06/15/2020. Elevated left hemidiaphragm. Trace right pleural effusion. Descending thoracic aortic atherosclerosis. Hepatobiliary: The gallbladder is only faintly seen and demonstrates a small amount of accentuated density dependently which could be from small stones or vicarious contrast excretion. Pancreas: Unremarkable Spleen: Dense rim calcified 5.4 cm in diameter splenic lesion with some stellate central calcification, as shown on prior exams back through the last 7 years. Adrenals/Urinary Tract: Thickened adrenal glands bilaterally. Stable mildly high density exophytic lesion from the left kidney lower pole measuring 1.2 by 0.9 cm with internal density of 59  Hounsfield units, nonspecific but probably a complex cyst. No hydronephrosis or hydroureter. No definite urinary tract calculi. Stomach/Bowel: Distal colonic fluid levels favoring diarrheal process. Orally administered contrast extends through to the rectum. Distended sigmoid colon extending into the left upper quadrant  with some mild wall thickening in a narrow or region of sigmoid colon on image 32 series 6 also shown on image 26 series 3, nonspecific but possibly from local peristaltic activity given the smaller lumen. Redundant sigmoid colon noted. No overt twisting to indicate sigmoid volvulus. There is dense contrast medium in the terminal ileum. Scattered air-fluid levels in mildly prominent loops of small bowel. No significant transition in bowel caliber to suggest a lead point for obstruction. Vascular/Lymphatic: Aortoiliac atherosclerotic vascular disease. Right common iliac artery 2.6 cm in diameter. No pathologic adenopathy. Reproductive: Uterus absent.  Adnexa unremarkable. Other: Subcutaneous edema along the hips and pelvis. No definite ascites. Musculoskeletal: Lumbar spondylosis and degenerative disc disease. Degenerative chondral thinning in both hips. Bony demineralization. IMPRESSION: 1. Distal colonic air-fluid levels with prominence of small and large bowel loops with air-fluid levels favoring ileus over obstruction. 2. Distended sigmoid colon is notable in extends towards the left upper quadrant without volvulus identified. There is some localized wall thickening in the left upper quadrant sigmoid colon probably from local peristaltic activity rather than inflammation or mass. 3. Improved atelectasis but increased ground-glass opacities in the right lower lobe, suspicious for pneumonia or alveolitis. 4. Trace right pleural effusion. 5. Elevated left hemidiaphragm. 6. Chronically stable dense 5.4 cm rim calcified splenic lesion, probably benign. 7. Lumbar spondylosis and degenerative disc disease. Bilateral degenerative hip arthropathy. Electronically Signed   By: Van Clines M.D.   On: 06/20/2020 19:10   Scheduled Meds: . amiodarone  200 mg Oral BID  . amoxicillin-clavulanate  1 tablet Oral Q12H  . enoxaparin (LOVENOX) injection  40 mg Subcutaneous Q24H  . feeding supplement  1 Container Oral TID  WC  . metoprolol tartrate  12.5 mg Oral BID  . potassium chloride  40 mEq Oral Daily  . potassium chloride  40 mEq Oral Once   Continuous Infusions:   LOS: 6 days    Time spent: 35 mins.    Shawna Clamp, MD [facility]   If 7PM-7AM, please contact night-coverage

## 2020-06-22 NOTE — Progress Notes (Signed)
Subjective: CC: Discussed with nursing staff. Patient has been tolerating sips of cld without n/v. She had large semi-solid bm yesterday.   Objective: Vital signs in last 24 hours: Temp:  [97.4 F (36.3 C)-99.9 F (37.7 C)] 98.6 F (37 C) (05/19 0313) Pulse Rate:  [64-79] 66 (05/19 0313) Resp:  [16-23] 16 (05/19 0313) BP: (120-138)/(68-99) 120/73 (05/19 0313) SpO2:  [90 %-97 %] 94 % (05/19 0313) Weight:  [77.6 kg] 77.6 kg (05/19 0313) Last BM Date: 06/21/20  Intake/Output from previous day: 05/18 0701 - 05/19 0700 In: 100 [P.O.:100] Out: 100 [Urine:100] Intake/Output this shift: No intake/output data recorded.  PE: Gen: NAD, pleasantly demented Card: Reg Pulm:rate and effort normal Abd: Soft, abdomen is mildly distended with upper abdominal fullness,abdomen nontender to light and deep palpation, +BS Skin: warm and dry, no rashes  Lab Results:  Recent Labs    06/21/20 0150 06/22/20 0021  WBC 18.5* 16.2*  HGB 12.5 12.1  HCT 37.8 37.4  PLT 160 187   BMET Recent Labs    06/21/20 0150 06/22/20 0021  NA 141 141  K 3.0* 3.0*  CL 112* 111  CO2 21* 22  GLUCOSE 139* 107*  BUN 10 10  CREATININE 0.93 0.94  CALCIUM 8.4* 8.5*   PT/INR No results for input(s): LABPROT, INR in the last 72 hours. CMP     Component Value Date/Time   NA 141 06/22/2020 0021   K 3.0 (L) 06/22/2020 0021   CL 111 06/22/2020 0021   CO2 22 06/22/2020 0021   GLUCOSE 107 (H) 06/22/2020 0021   BUN 10 06/22/2020 0021   CREATININE 0.94 06/22/2020 0021   CALCIUM 8.5 (L) 06/22/2020 0021   PROT 5.6 (L) 06/18/2020 0806   ALBUMIN 2.4 (L) 06/18/2020 0806   AST 20 06/18/2020 0806   ALT 14 06/18/2020 0806   ALKPHOS 70 06/18/2020 0806   BILITOT 0.8 06/18/2020 0806   GFRNONAA >60 06/22/2020 0021   GFRAA >60 02/13/2018 0701   Lipase     Component Value Date/Time   LIPASE 63 (H) 01/27/2018 1110       Studies/Results: CT ABDOMEN PELVIS WO CONTRAST  Result Date:  06/20/2020 CLINICAL DATA:  Abdominal distension, small bowel obstruction. EXAM: CT ABDOMEN AND PELVIS WITHOUT CONTRAST TECHNIQUE: Multidetector CT imaging of the abdomen and pelvis was performed following the standard protocol without IV contrast. COMPARISON:  06/15/2020 FINDINGS: Lower chest: Confluent mostly ground-glass airspace opacities in the right lower lobe, although the aeration in the right lower lobe is improved compared 06/15/2020. Elevated left hemidiaphragm. Trace right pleural effusion. Descending thoracic aortic atherosclerosis. Hepatobiliary: The gallbladder is only faintly seen and demonstrates a small amount of accentuated density dependently which could be from small stones or vicarious contrast excretion. Pancreas: Unremarkable Spleen: Dense rim calcified 5.4 cm in diameter splenic lesion with some stellate central calcification, as shown on prior exams back through the last 7 years. Adrenals/Urinary Tract: Thickened adrenal glands bilaterally. Stable mildly high density exophytic lesion from the left kidney lower pole measuring 1.2 by 0.9 cm with internal density of 59 Hounsfield units, nonspecific but probably a complex cyst. No hydronephrosis or hydroureter. No definite urinary tract calculi. Stomach/Bowel: Distal colonic fluid levels favoring diarrheal process. Orally administered contrast extends through to the rectum. Distended sigmoid colon extending into the left upper quadrant with some mild wall thickening in a narrow or region of sigmoid colon on image 32 series 6 also shown on image 26 series 3, nonspecific but  possibly from local peristaltic activity given the smaller lumen. Redundant sigmoid colon noted. No overt twisting to indicate sigmoid volvulus. There is dense contrast medium in the terminal ileum. Scattered air-fluid levels in mildly prominent loops of small bowel. No significant transition in bowel caliber to suggest a lead point for obstruction. Vascular/Lymphatic:  Aortoiliac atherosclerotic vascular disease. Right common iliac artery 2.6 cm in diameter. No pathologic adenopathy. Reproductive: Uterus absent.  Adnexa unremarkable. Other: Subcutaneous edema along the hips and pelvis. No definite ascites. Musculoskeletal: Lumbar spondylosis and degenerative disc disease. Degenerative chondral thinning in both hips. Bony demineralization. IMPRESSION: 1. Distal colonic air-fluid levels with prominence of small and large bowel loops with air-fluid levels favoring ileus over obstruction. 2. Distended sigmoid colon is notable in extends towards the left upper quadrant without volvulus identified. There is some localized wall thickening in the left upper quadrant sigmoid colon probably from local peristaltic activity rather than inflammation or mass. 3. Improved atelectasis but increased ground-glass opacities in the right lower lobe, suspicious for pneumonia or alveolitis. 4. Trace right pleural effusion. 5. Elevated left hemidiaphragm. 6. Chronically stable dense 5.4 cm rim calcified splenic lesion, probably benign. 7. Lumbar spondylosis and degenerative disc disease. Bilateral degenerative hip arthropathy. Electronically Signed   By: Gaylyn Rong M.D.   On: 06/20/2020 19:10   DG Abd Portable 1V  Result Date: 06/20/2020 CLINICAL DATA:  Small bowel obstruction EXAM: PORTABLE ABDOMEN - 1 VIEW COMPARISON:  06/18/2020 FINDINGS: Dilated bowel loops in the abdomen and pelvis. Markedly dilated central bowel loops measures up to 15.7 cm may reflect the sigmoid colon. This is worsened since prior study. Contrast seen within the right colon and rectum. No free air organomegaly. Rim calcified splenic mass again noted. IMPRESSION: Dilated bowel loops throughout the abdomen and pelvis with increasing sigmoid distention measuring up to 15.7 cm. Cannot completely exclude sigmoid volvulus. Electronically Signed   By: Charlett Nose M.D.   On: 06/20/2020 09:01     Anti-infectives: Anti-infectives (From admission, onward)   Start     Dose/Rate Route Frequency Ordered Stop   06/21/20 1315  amoxicillin-clavulanate (AUGMENTIN) 875-125 MG per tablet 1 tablet        1 tablet Oral Every 12 hours 06/21/20 1219     06/18/20 1000  Ampicillin-Sulbactam (UNASYN) 3 g in sodium chloride 0.9 % 100 mL IVPB  Status:  Discontinued        3 g 200 mL/hr over 30 Minutes Intravenous Every 8 hours 06/18/20 0947 06/21/20 1219   06/16/20 1200  Ampicillin-Sulbactam (UNASYN) 3 g in sodium chloride 0.9 % 100 mL IVPB  Status:  Discontinued        3 g 200 mL/hr over 30 Minutes Intravenous Every 12 hours 06/16/20 1153 06/18/20 0947   06/15/20 2100  Ampicillin-Sulbactam (UNASYN) 3 g in sodium chloride 0.9 % 100 mL IVPB        3 g 200 mL/hr over 30 Minutes Intravenous  Once 06/15/20 2009 06/15/20 2204   06/15/20 2100  vancomycin (VANCOREADY) IVPB 1000 mg/200 mL        1,000 mg 200 mL/hr over 60 Minutes Intravenous  Once 06/15/20 2009 06/16/20 0023       Assessment/Plan Hypertension Paroxysmal atrial fibrillation Diastolic CHFpEF CVA with residual right-sided weakness Parkinsons Dementia RLE age indeterminate DVT - Holding anticoagulation due to high risk of brain bleed AKI - resolved Acute hypoxic respiratory failure 2/2 aspiration PNA  PAF, Afib with RVR - per cardiology Goals of care -  family confirms  DNR but want full medical care at this point, she will return home to her SNF with hospice following -- above per TRH --  Small/large bowel ileus - Admitted with fecal impaction and sbo vs diffuse small bowel ileus on 5/12 - Xray 5/17 w/ Dilated bowel loops throughout the abdomen and pelvis with increasing sigmoid distention measuring up to 15.7 cm - CT 5/17 w/ no signs of mechanical obstruction after review.  Per MD's note, this appears to be a chronic problem more than likely due to her debilitated state.  No role for surgery at this point.  - Patient tolerating  cld and continues to have bowel function. At this point time, she has no signs of obstruction and can have diet advancement per speech. They are recommending DYS1 diet.  - If patient develops any worsening distension may benefit from GI consultation for bowel regimen and/or management or consideration of palliative decompression colonoscopy.   - Palliative following and last saw on 5/16 and are following. Per IM note "family confirms DNR but want full medical care at this point.  She will return home to her SNF with hospice following"  FEN - CLD VTE - SCds, Loveonx ID - Augmentin   LOS: 6 days    Jacinto Halim , [facility], [place] [facility] 06/22/2020, 7:59 AM Please see Amion for pager number during day hours 7:00am-4:30pm

## 2020-06-23 LAB — BASIC METABOLIC PANEL
Anion gap: 5 (ref 5–15)
BUN: 12 mg/dL (ref 8–23)
CO2: 23 mmol/L (ref 22–32)
Calcium: 8.4 mg/dL — ABNORMAL LOW (ref 8.9–10.3)
Chloride: 113 mmol/L — ABNORMAL HIGH (ref 98–111)
Creatinine, Ser: 0.99 mg/dL (ref 0.44–1.00)
GFR, Estimated: 57 mL/min — ABNORMAL LOW (ref >60)
Glucose, Bld: 93 mg/dL (ref 70–99)
Potassium: 3.2 mmol/L — ABNORMAL LOW (ref 3.5–5.1)
Sodium: 141 mmol/L (ref 135–145)

## 2020-06-23 LAB — CBC
HCT: 34.9 % — ABNORMAL LOW (ref 36.0–46.0)
Hemoglobin: 11.5 g/dL — ABNORMAL LOW (ref 12.0–15.0)
MCH: 26.4 pg (ref 26.0–34.0)
MCHC: 33 g/dL (ref 30.0–36.0)
MCV: 80.2 fL (ref 80.0–100.0)
Platelets: 195 10*3/uL (ref 150–400)
RBC: 4.35 MIL/uL (ref 3.87–5.11)
RDW: 16 % — ABNORMAL HIGH (ref 11.5–15.5)
WBC: 14.7 10*3/uL — ABNORMAL HIGH (ref 4.0–10.5)
nRBC: 0 % (ref 0.0–0.2)

## 2020-06-23 LAB — SARS CORONAVIRUS 2 (TAT 6-24 HRS): SARS Coronavirus 2: NEGATIVE

## 2020-06-23 LAB — MAGNESIUM: Magnesium: 2.2 mg/dL (ref 1.7–2.4)

## 2020-06-23 LAB — PHOSPHORUS: Phosphorus: 3.3 mg/dL (ref 2.5–4.6)

## 2020-06-23 MED ORDER — AMIODARONE HCL 200 MG PO TABS: 200.0000 mg | ORAL_TABLET | Freq: Two times a day (BID) | ORAL | 0 refills | Status: AC

## 2020-06-23 MED ORDER — POTASSIUM CHLORIDE 20 MEQ PO PACK
40.0000 meq | PACK | Freq: Once | ORAL | Status: AC
  Administered 2020-06-23: 40 meq via ORAL
  Filled 2020-06-23: qty 2

## 2020-06-23 NOTE — Progress Notes (Signed)
   Referral received from Transitions of care dept. I have spoke to the daughter who does confirm her interest in hospice services back at the facility. We have reached out for orders at facility to care for pt. Our MD has approved the pt for hospice care. We will follow up with the pt at [facility] after she arrives. Norm Parcel RN [phone]

## 2020-06-23 NOTE — Plan of Care (Signed)

## 2020-06-23 NOTE — Plan of Care (Signed)
  Problem: Clinical Measurements: Goal: Diagnostic test results will improve Outcome: Progressing Goal: Respiratory complications will improve Outcome: Progressing Goal: Cardiovascular complication will be avoided Outcome: Progressing   Problem: Nutrition: Goal: Adequate nutrition will be maintained Outcome: Progressing   Problem: Pain Managment: Goal: General experience of comfort will improve Outcome: Progressing   Problem: Skin Integrity: Goal: Risk for impaired skin integrity will decrease Outcome: Progressing

## 2020-06-23 NOTE — Discharge Summary (Addendum)
Physician Discharge Summary  [name] XTG:[id number] DOB: [birth year] DOA: 06/15/2020  PCP: Javier Glazier, MD  Admit date: 06/15/2020 .  Discharge date: 06/23/2020.  Admitted From:  SNF  Disposition:  SNF.  Recommendations for Outpatient Follow-up:  1. Follow up with PCP in 1-2 weeks. 2. Please obtain BMP/CBC in one week.  Home Health:None. Equipment/Devices: None.  Discharge Condition: Stable CODE STATUS:DNR Diet recommendation: Dysphagia 1 diet  Brief Summary / Hospital Course: This 80 year old female with HTN, PAF, diastolic CHF( last EF 27-03% ), prior CVA with residual right-sided weakness, dementia sent from skilled nursing facility for altered mental status.At baseline she is able to recognize family members, answering yes and no questions but over the last couple of days, she has been poorly responsive and has had several episodes of vomiting. She has been more lethargic. She was brought to the ED.  she was found to be hypotensive, CT of the abdomen and pelvis showed multiple distended loops of small bowel concerning for small bowel obstruction. Imaging also was concerning for multifocal pneumonia.  Patient was admitted for small bowel obstruction, A. fib with RVR, AKI and aspiration pneumonia. General surgery was consulted, recommended conservative management.  She had an NG tube placed and she was placed on antibiotics for aspiration pneumonia. She was found to have A. fib with RVR with wide-complex tachycardia , cardiology was consulted and she was placed on amiodarone drip. Given poor baseline and multiple medical problems palliative care was consulted as well.  Heart rate remains controlled.  With conservative management small bowel obstruction has resolved,  NG tube was removed. Speech and swallow evaluation was completed,  recommended dysphagia 1 diet.  Patient has  Tolerated diet well.  Amiodarone IV switched to oral .  Patient is not on anticoagulation due to  high risk for brain bleeding.  Patient has also completed antibiotics for 7 days for aspiration pneumonia.  Acute kidney injury and hypoxia has resolved.  Patient is back to room air.  Palliative care had discussion with the family and they want to continue medical management but they want hospice.  Patient is medically cleared to be discharged to skilled nursing facility with hospice following.  She was managed for below problems.   Discharge Diagnoses:  Principal Problem:   SBO (small bowel obstruction) (HCC) Active Problems:   Paroxysmal atrial fibrillation (HCC)   AKI (acute kidney injury) ([place])   Acute respiratory failure with hypoxia (HCC)   Dementia ([place])   Acute metabolic encephalopathy   Aspiration pneumonia (HCC)   Right leg DVT (HCC)   DNR (do not resuscitate)   Malnutrition of moderate degree  Small bowel obstruction > Improved. Continue conservative management , appreciate general surgery follow-up.  She has started having bowel movements, NG tube was removed on 5/16.  She is started on clear liquid diet, tolerating well. Speech and swallow evaluation recommended dysphagia 1 diet. Patient is tolerating dysphagia 1 diet.  Active Problems Severe sepsis due to aspiration pneumonia: She met criteria with hypotension, tachycardia, as well as elevated lactic acid.  She was fluid resuscitated and blood pressure has improved. Continue Unasyn, today day # 7, procalcitonin was elevated.  Plan to total of 7 days, antibiotic changed to Augmentin. Sepsis physiology resolved.  She has completed antibiotics.  PAF, A. fib with RVR : She has history of paroxysmal A. fib developed RVR in the ED in the setting of pneumonia, dehydration.  Cardiology consulted. HR is now controlled. Continue amiodarone, 2D echo showed EF  60 to 65% Not anticoagulated due to concerns for high risk of brain bleed. Switch to oral amiodarone as she is able to take orally.  Hypokalemia: Due to  losses, continue to replete.  Hypernatremia : Resolved  Age indeterminate DVT : Patient has DVT noted in right common femoral vein, SF junction, right femoral vein, right proximal profunda vein, right popliteal vein, and right posterior tibial veins. Hold anticoagulation due to high risk of brain bleeding. Discussed this with daughter, she is in agreement.  Acute hypoxic respiratory failure : > Improved multifactorial in the setting of aspiration pneumonia, small right-sided effusion.  Continue  supplemental oxygen, monitor closely fluid status.  Chronic microhemorrhages within the supratentorial brain: It was discussed with neurology over the phone, given concern for amyloid there is a fairly decent chance of bleed.  Hold anticoagulation, discussed with the daughter at bedside on 5/14.  Chronic diastolic CHF: Not decompensated. Slight lower extremity edema, suspect chronic.  Monitor fluid status closely. BNP elevated at 274  Acute kidney injury> Resolved. likely in the setting of sepsis, dehydration due to vomiting secondary to bowel obstruction.  Creatinine normal  Coronary artery disease: Denies chest pain, stable. Negative high-sensitivity troponin  Goals of care: family confirms DNR but want full medical care at this point. She will return home to her SNF with hospice following.    Discharge Instructions  Discharge Instructions    Call MD for:  difficulty breathing, headache or visual disturbances   Complete by: As directed    Call MD for:  persistant dizziness or light-headedness   Complete by: As directed    Call MD for:  persistant nausea and vomiting   Complete by: As directed    Diet - low sodium heart healthy   Complete by: As directed    Diet full liquid   Complete by: As directed    Discharge instructions   Complete by: As directed    Patient is being discharged to skilled nursing facility for rehab with hospice following. Advised to  follow-up with PCP in 1 week.   Discharge wound care:   Complete by: As directed    Continue wound care at nursing home.   Increase activity slowly   Complete by: As directed      Allergies as of 06/23/2020      Reactions   Sulfonamide Derivatives Other (See Comments)   Unknown. Pt doesn't remember.      Medication List    STOP taking these medications   atorvastatin 40 MG tablet Commonly known as: LIPITOR   bisacodyl 10 MG suppository Commonly known as: DULCOLAX   cefTRIAXone 1 g in dextrose 5 % 50 mL   feeding supplement Liqd   ferrous sulfate 325 (65 FE) MG tablet   folic acid 1 MG tablet Commonly known as: FOLVITE   Gerhardt's butt cream Crea   magnesium oxide 400 MG tablet Commonly known as: MAG-OX   multivitamin with minerals Tabs tablet   NORMAL SALINE FLUSH IV   phosphorus 155-852-130 MG tablet Commonly known as: K PHOS NEUTRAL   polyethylene glycol 17 g packet Commonly known as: MiraLax   potassium chloride SA 20 MEQ tablet Commonly known as: KLOR-CON     TAKE these medications   amiodarone 200 MG tablet Commonly known as: PACERONE Take 1 tablet (200 mg total) by mouth 2 (two) times daily. What changed: when to take this   aspirin EC 81 MG tablet Take 1 tablet (81 mg total) by mouth daily.  Calmoseptine 0.44-20.6 % Oint Generic drug: Menthol-Zinc Oxide Apply 1 application topically in the morning and at bedtime. To buttocks for protection   lactulose 10 GM/15ML solution Commonly known as: CHRONULAC Take 15 mLs by mouth in the morning and at bedtime. What changed: Another medication with the same name was removed. Continue taking this medication, and follow the directions you see here.   levothyroxine 50 MCG tablet Commonly known as: SYNTHROID Take 50 mcg by mouth daily.   Linzess 145 MCG Caps capsule Generic drug: linaclotide Take 145 mcg by mouth daily.   metoprolol tartrate 25 MG tablet Commonly known as: LOPRESSOR Take 0.5  tablets (12.5 mg total) by mouth 2 (two) times daily. What changed: how much to take   Nyamyc powder Generic drug: nystatin Apply 1 application topically See admin instructions. Apply to right groin bid x 10 days   pantoprazole 20 MG tablet Commonly known as: PROTONIX Take 20 mg by mouth daily. What changed: Another medication with the same name was removed. Continue taking this medication, and follow the directions you see here.   senna-docusate 8.6-50 MG tablet Commonly known as: Senokot-S Take 1 tablet by mouth 2 (two) times daily. What changed: how much to take   Vitamin D (Ergocalciferol) 1.25 MG (50000 UNIT) Caps capsule Commonly known as: DRISDOL Take 50,000 Units by mouth every 30 (thirty) days. 15th            Discharge Care Instructions  (From admission, onward)         Start     Ordered   06/23/20 0000  Discharge wound care:       Comments: Continue wound care at nursing home.   06/23/20 1119          Follow-up Information    Javier Glazier, MD Follow up in 1 week(s).   Specialty: Internal Medicine Contact information: [place] [address] [phone]        Thompson Grayer, MD .   Specialty: Cardiology Contact information: [place] [address] [phone]        Buford Dresser, MD .   Specialty: Cardiology Contact information: [address] [place] [address] [phone]              Allergies  Allergen Reactions  . Sulfonamide Derivatives Other (See Comments)    Unknown. Pt doesn't remember.    Consultations:  Cardiology, general surgery, palliative care.   Procedures/Studies: CT ABDOMEN PELVIS WO CONTRAST  Result Date: 06/20/2020 CLINICAL DATA:  Abdominal distension, small bowel obstruction. EXAM: CT ABDOMEN AND PELVIS WITHOUT CONTRAST TECHNIQUE: Multidetector CT imaging of the abdomen and pelvis was performed  following the standard protocol without IV contrast. COMPARISON:  06/15/2020 FINDINGS: Lower chest: Confluent mostly ground-glass airspace opacities in the right lower lobe, although the aeration in the right lower lobe is improved compared 06/15/2020. Elevated left hemidiaphragm. Trace right pleural effusion. Descending thoracic aortic atherosclerosis. Hepatobiliary: The gallbladder is only faintly seen and demonstrates a small amount of accentuated density dependently which could be from small stones or vicarious contrast excretion. Pancreas: Unremarkable Spleen: Dense rim calcified 5.4 cm in diameter splenic lesion with some stellate central calcification, as shown on prior exams back through the last 7 years. Adrenals/Urinary Tract: Thickened adrenal glands bilaterally. Stable mildly high density exophytic lesion from the left kidney lower pole measuring 1.2 by 0.9 cm with internal density of 59 Hounsfield units, nonspecific but  probably a complex cyst. No hydronephrosis or hydroureter. No definite urinary tract calculi. Stomach/Bowel: Distal colonic fluid levels favoring diarrheal process. Orally administered contrast extends through to the rectum. Distended sigmoid colon extending into the left upper quadrant with some mild wall thickening in a narrow or region of sigmoid colon on image 32 series 6 also shown on image 26 series 3, nonspecific but possibly from local peristaltic activity given the smaller lumen. Redundant sigmoid colon noted. No overt twisting to indicate sigmoid volvulus. There is dense contrast medium in the terminal ileum. Scattered air-fluid levels in mildly prominent loops of small bowel. No significant transition in bowel caliber to suggest a lead point for obstruction. Vascular/Lymphatic: Aortoiliac atherosclerotic vascular disease. Right common iliac artery 2.6 cm in diameter. No pathologic adenopathy. Reproductive: Uterus absent.  Adnexa unremarkable. Other: Subcutaneous edema along  the hips and pelvis. No definite ascites. Musculoskeletal: Lumbar spondylosis and degenerative disc disease. Degenerative chondral thinning in both hips. Bony demineralization. IMPRESSION: 1. Distal colonic air-fluid levels with prominence of small and large bowel loops with air-fluid levels favoring ileus over obstruction. 2. Distended sigmoid colon is notable in extends towards the left upper quadrant without volvulus identified. There is some localized wall thickening in the left upper quadrant sigmoid colon probably from local peristaltic activity rather than inflammation or mass. 3. Improved atelectasis but increased ground-glass opacities in the right lower lobe, suspicious for pneumonia or alveolitis. 4. Trace right pleural effusion. 5. Elevated left hemidiaphragm. 6. Chronically stable dense 5.4 cm rim calcified splenic lesion, probably benign. 7. Lumbar spondylosis and degenerative disc disease. Bilateral degenerative hip arthropathy. Electronically Signed   By: Van Clines M.D.   On: 06/20/2020 19:10   DG Chest 1 View  Result Date: 06/15/2020 CLINICAL DATA:  Altered mental status. EXAM: CHEST  1 VIEW COMPARISON:  02/03/2018 FINDINGS: Normal sized heart. Gas distended colon beneath both hemidiaphragms. Poor inspiration. Minimal bibasilar atelectasis. Thoracic spine degenerative changes. IMPRESSION: 1. Gas distended colon beneath both hemidiaphragms. This could be an indication of colonic ileus or obstruction. 2. Poor inspiration with minimal bibasilar atelectasis. Electronically Signed   By: Claudie Revering M.D.   On: 06/15/2020 15:13   CT Head Wo Contrast  Result Date: 06/15/2020 CLINICAL DATA:  Altered mental status. EXAM: CT HEAD WITHOUT CONTRAST TECHNIQUE: Contiguous axial images were obtained from the base of the skull through the vertex without intravenous contrast. COMPARISON:  Head CT 09/03/2013 FINDINGS: Brain: No hemorrhage. Progressive atrophy from prior exam. Advanced periventricular  white matter hypodensity, left greater than right, stable from prior exam and likely sequela of chronic small vessel ischemia. No evidence of acute infarct. No midline shift or mass effect. No subdural or extra-axial collection. Enlarged partially empty sella. Vascular: Atherosclerosis of skullbase vasculature without hyperdense vessel or abnormal calcification. Skull: No fracture or focal lesion. Sinuses/Orbits: Dystrophic left globe.  No acute findings. Other: None. IMPRESSION: 1. No acute intracranial abnormality. 2. Progressive atrophy from 2015. Advanced chronic small vessel ischemia. Electronically Signed   By: Keith Rake M.D.   On: 06/15/2020 16:07   MR BRAIN WO CONTRAST  Result Date: 06/16/2020 CLINICAL DATA:  Neuro deficit, acute, stroke suspected. EXAM: MRI HEAD WITHOUT CONTRAST TECHNIQUE: Multiplanar, multiecho pulse sequences of the brain and surrounding structures were obtained without intravenous contrast. COMPARISON:  Prior head CT examinations 06/15/2020 and earlier. Brain MRI 09/05/2013. FINDINGS: Brain: Intermittently motion degraded examination. Most notably, there is mild-to-moderate motion degradation of the axial T2/FLAIR sequence. Moderate cerebral atrophy. Commensurate prominence of the ventricles  and sulci. Comparatively mild cerebellar atrophy. Severe cerebral white matter chronic small vessel ischemic disease, progressed from the brain MRI of 09/05/2013. This includes a small chronic lacunar infarct within the left corona radiata/basal ganglia. There are fairly numerous scattered tiny foci of SWI signal loss compatible with chronic microhemorrhages within the supratentorial brain. There is no acute infarct. No evidence of intracranial mass. No extra-axial fluid collection. No midline shift. Partially empty sella turcica. Vascular: Signal abnormality within the intracranial left vertebral artery, severe stenosis or vessel occlusion at this site, unchanged from the brain MRI of  09/05/2013. Flow voids otherwise preserved within the proximal large arterial vessels. Skull and upper cervical spine: No focal marrow lesion. Incompletely assessed cervical spondylosis. Multilevel in least mild spinal canal stenosis within the visualized upper cervical spine. Sinuses/Orbits: Redemonstrated left phthisis bulbi. Prior right lens replacement. Trace bilateral ethmoid sinus mucosal thickening. IMPRESSION: Mild-to-moderate intermittent motion degradation, as described. No evidence of acute intracranial abnormality. Moderate cerebral atrophy and severe cerebral white matter chronic small vessel ischemic disease, progressed from the brain MRI of 09/05/2013. Also progressed from this prior exam, there are fairly numerous chronic microhemorrhages within the supratentorial brain, raising the possibility of cerebral amyloid angiopathy. However, an atypical appearance of chronic hypertensive microangiopathy is also possible. Unchanged from the prior MRI of 09/05/2013, there is signal abnormality within the intracranial left vertebral artery suggestive of high-grade stenosis or vessel occlusion at this site. Redemonstrated left phthisis bulbi. Electronically Signed   By: Kellie Simmering DO   On: 06/16/2020 15:21   NM Pulmonary Perfusion  Result Date: 06/16/2020 CLINICAL DATA:  Appy at pain EXAM: NUCLEAR MEDICINE PERFUSION LUNG SCAN TECHNIQUE: Perfusion images were obtained in multiple projections after intravenous injection of radiopharmaceutical. Views: Anterior, posterior, left lateral, right lateral, RPO, LPO, RAO, LAO RADIOPHARMACEUTICALS:  3.0 mCi Tc-102mMAA IV COMPARISON:  Chest radiograph Jun 15, 2020 FINDINGS: The distribution of radiotracer uptake bilaterally is homogeneous and symmetric. No focal perfusion defects are appreciable. IMPRESSION: No appreciable perfusion defects. There are no findings which are felt to be indicative of pulmonary embolus. Electronically Signed   By: WLowella GripIII  M.D.   On: 06/16/2020 16:53   DG Abd Portable 1V  Result Date: 06/20/2020 CLINICAL DATA:  Small bowel obstruction EXAM: PORTABLE ABDOMEN - 1 VIEW COMPARISON:  06/18/2020 FINDINGS: Dilated bowel loops in the abdomen and pelvis. Markedly dilated central bowel loops measures up to 15.7 cm may reflect the sigmoid colon. This is worsened since prior study. Contrast seen within the right colon and rectum. No free air organomegaly. Rim calcified splenic mass again noted. IMPRESSION: Dilated bowel loops throughout the abdomen and pelvis with increasing sigmoid distention measuring up to 15.7 cm. Cannot completely exclude sigmoid volvulus. Electronically Signed   By: KRolm BaptiseM.D.   On: 06/20/2020 09:01   DG Abd Portable 1V  Result Date: 06/19/2020 CLINICAL DATA:  Resistance while advancing NG tube. EXAM: PORTABLE ABDOMEN - 1 VIEW COMPARISON:  06/18/2020 radiograph FINDINGS: Persistent dilated loops bowel in the abdomen. There is contrast within the ascending and descending colon. Unchanged calcified left upper quadrant/splenic mass. Nasogastric tube side port overlies the distal stomach and tip overlying the gastroesophageal junction. There are bibasilar and right upper lung airspace opacities. Curvilinear opacity overlying the left lower lung which likely represents atelectasis. IMPRESSION: Nasogastric tube side port overlies the distal esophagus, tip overlying the gastroesophageal junction. Persistent markedly dilated loops of bowel in the abdomen. Bibasilar and right upper lung airspace opacities, compatible with  multifocal infection or edema. Electronically Signed   By: Maurine Simmering   On: 06/19/2020 08:18   DG Abd Portable 1V  Result Date: 06/18/2020 CLINICAL DATA:  Abdominal distention. Altered mental status. Evaluate distended small bowel. EXAM: PORTABLE ABDOMEN - 1 VIEW COMPARISON:  CT 06/15/2020 FINDINGS: Nasogastric tube is now in the LOWER esophagus. Consider advancing to 15 centimeters further into  the stomach. There is no evidence for free intraperitoneal air. There is persistent significant gaseous distension of bowel loops. There is contrast within the ascending and descending colon, excluding complete small bowel obstruction. Persistent stool identified in the distal colon. 5.5 centimeter calcified splenic lesion is stable. IMPRESSION: Nasogastric tube now in the distal esophagus. Persistent gaseous distension of bowel loops but interval progression of oral contrast material, excluding complete small bowel obstruction. No evidence for free intraperitoneal air. Electronically Signed   By: Nolon Nations M.D.   On: 06/18/2020 11:45   DG Abd Portable 1V  Result Date: 06/17/2020 CLINICAL DATA:  Small bowel obstruction EXAM: PORTABLE ABDOMEN - 1 VIEW COMPARISON:  06/17/2020, 1:29 a.m. FINDINGS: Redemonstrated diffusely distended small bowel, with gas-filled, nondistended colon and scattered gas and stool present to the rectum. Large stool balls are again noted in the rectum. No obvious free air on supine radiographs. Esophagogastric tube is position with tip just below the diaphragm, side port at least 5 cm above the diaphragm. IMPRESSION: Redemonstrated diffusely distended small bowel, with gas-filled, nondistended colon and scattered gas and stool present to the rectum. Large stool balls are again noted in the rectum. No obvious free air on supine radiographs. Electronically Signed   By: Eddie Candle M.D.   On: 06/17/2020 17:28   DG Abd Portable 1V-Small Bowel Obstruction Protocol-initial, 8 hr delay  Result Date: 06/17/2020 CLINICAL DATA:  SBO 8 hour delayed film EXAM: PORTABLE ABDOMEN - 1 VIEW COMPARISON:  Jun 16, 2020 FINDINGS: Enteric tube with tip and side port overlying the stomach. Dilated gas-filled loops of small bowel are again seen throughout the abdomen. Radiopaque enteric contrast visualized in the left upper quadrant likely within the stomach. Large calcified splenic lesion again  visualized. IMPRESSION: Enteric tube with tip and side port overlying the stomach. Radiopaque enteric contrast visualized in the left upper quadrant likely within the stomach with dilated gas-filled loops of small and large bowel again seen throughout the abdomen. Electronically Signed   By: Dahlia Bailiff MD   On: 06/17/2020 02:16   DG Abd Portable 1V-Small Bowel Protocol-Position Verification  Result Date: 06/16/2020 CLINICAL DATA:  Status post nasogastric tube placement. EXAM: PORTABLE ABDOMEN - 1 VIEW COMPARISON:  CT abdomen and pelvis dated 06/15/2020 FINDINGS: Enteric tube has been placed and appears adequately positioned in the stomach. Gas-filled dilated small bowel loops are again appreciated throughout the abdomen, better demonstrated on yesterday's CT, compatible with obstruction versus ileus. IMPRESSION: 1. Enteric tube appears adequately positioned in the stomach. 2. Continued evidence of small bowel obstruction versus ileus. Electronically Signed   By: Franki Cabot M.D.   On: 06/16/2020 11:08   VAS [place] LOWER EXTREMITY VENOUS (DVT) (ONLY [facility] & [facility])  Result Date: 06/16/2020  Lower Venous DVT Study Patient Name:  [name]  Date of Exam:   06/16/2020 Medical Rec #: [id number]         Accession #:    [id number] Date of Birth: April 20, 1940          Patient Gender: F Patient Age:   80Y Exam Location:  [facility]  Procedure:      VAS [place] LOWER EXTREMITY VENOUS (DVT) Referring Phys: [id number] ANKIT NANAVATI --------------------------------------------------------------------------------  Indications: Edema.  Comparison Study: no prior Performing Technologist: Abram Sander RVS  Examination Guidelines: A complete evaluation includes B-mode imaging, spectral Doppler, color Doppler, and power Doppler as needed of all accessible portions of each vessel. Bilateral testing is considered an integral part of a complete examination. Limited examinations for reoccurring indications may be performed as noted.  The reflux portion of the exam is performed with the patient in reverse Trendelenburg.  +---------+---------------+---------+-----------+----------+-------------------+ RIGHT    CompressibilityPhasicitySpontaneityPropertiesThrombus Aging      +---------+---------------+---------+-----------+----------+-------------------+ CFV      None           Yes      Yes                  Age Indeterminate   +---------+---------------+---------+-----------+----------+-------------------+ SFJ      None                                         Age Indeterminate   +---------+---------------+---------+-----------+----------+-------------------+ FV Prox  None                                         Age Indeterminate   +---------+---------------+---------+-----------+----------+-------------------+ FV Mid   Partial                                      Age Indeterminate   +---------+---------------+---------+-----------+----------+-------------------+ FV DistalPartial                                      Age Indeterminate   +---------+---------------+---------+-----------+----------+-------------------+ PFV      Partial                                      Age Indeterminate   +---------+---------------+---------+-----------+----------+-------------------+ POP      Partial        Yes      Yes                  Age Indeterminate   +---------+---------------+---------+-----------+----------+-------------------+ PTV      Partial                                      Age Indeterminate   +---------+---------------+---------+-----------+----------+-------------------+ PERO                                                  Not well visualized +---------+---------------+---------+-----------+----------+-------------------+   +---------+---------------+---------+-----------+----------+--------------+ LEFT     CompressibilityPhasicitySpontaneityPropertiesThrombus Aging  +---------+---------------+---------+-----------+----------+--------------+ CFV      Full           Yes      Yes                                 +---------+---------------+---------+-----------+----------+--------------+  SFJ      Full                                                        +---------+---------------+---------+-----------+----------+--------------+ FV Prox  Full                                                        +---------+---------------+---------+-----------+----------+--------------+ FV Mid   Full                                                        +---------+---------------+---------+-----------+----------+--------------+ FV DistalFull                                                        +---------+---------------+---------+-----------+----------+--------------+ PFV      Full                                                        +---------+---------------+---------+-----------+----------+--------------+ POP      Full           Yes      Yes                                 +---------+---------------+---------+-----------+----------+--------------+ PTV      Full                                                        +---------+---------------+---------+-----------+----------+--------------+ PERO     Full                                                        +---------+---------------+---------+-----------+----------+--------------+     Summary: RIGHT: - Findings consistent with age indeterminate deep vein thrombosis involving the right common femoral vein, SF junction, right femoral vein, right proximal profunda vein, right popliteal vein, and right posterior tibial veins. - No cystic structure found in the popliteal fossa.  LEFT: - There is no evidence of deep vein thrombosis in the lower extremity.  - No cystic structure found in the popliteal fossa.  *See table(s) above for measurements and observations. Electronically  signed by Servando Snare MD on 06/16/2020 at 4:58:05 PM.    Final    ECHOCARDIOGRAM LIMITED  Result Date: 06/17/2020    ECHOCARDIOGRAM LIMITED REPORT  Patient Name:   [name] Date of Exam: 06/17/2020 Medical Rec #:  [id number]        Height:       62.0 in Accession #:    [id number]       Weight:       162.9 lb Date of Birth:  17-Feb-1940         BSA:          1.752 m Patient Age:    27 years         BP:           99/69 mmHg Patient Gender: F                HR:           73 bpm. Exam Location:  Inpatient Procedure: Limited Echo and Intracardiac Opacification Agent Indications:     R94.31 Abnormal EKG  History:         Patient has prior history of Echocardiogram examinations, most                  recent 02/03/2018. Stroke, Arrythmias:Atrial Fibrillation;                  Signs/Symptoms:Altered Mental Status. Parkinson's. Bowel                  Obstruction.  Sonographer:     Merrie Roof RDCS Referring Phys:  [id number] [place] Diagnosing Phys: Oswaldo Milian MD  Sonographer Comments: A limited echo was ordered. Very technically difficult due to altered mental status and poor windows IMPRESSIONS  1. Techincally difficult study, very limited views  2. Left ventricular ejection fraction, by estimation, is 60 to 65%. The left ventricle has normal function. Left ventricular endocardial border not optimally defined to evaluate regional wall motion.  3. Right ventricular systolic function was not well visualized. The right ventricular size is not well visualized.  4. A small pericardial effusion is present FINDINGS  Left Ventricle: Left ventricular ejection fraction, by estimation, is 60 to 65%. The left ventricle has normal function. Left ventricular endocardial border not optimally defined to evaluate regional wall motion. Definity contrast agent was given IV to delineate the left ventricular endocardial borders. Right Ventricle: The right ventricular size is not well visualized. Right ventricular  systolic function was not well visualized. Pericardium: A small pericardial effusion is present. Aortic Valve: The aortic valve was not well visualized. Aortic valve regurgitation is not visualized. Oswaldo Milian MD Electronically signed by Oswaldo Milian MD Signature Date/Time: 06/17/2020/2:43:55 PM    Final (Updated)    CT CHEST ABDOMEN PELVIS WO CONTRAST  Result Date: 06/15/2020 CLINICAL DATA:  New onset respiratory failure and abdominal pain, initial encounter EXAM: CT CHEST, ABDOMEN AND PELVIS WITHOUT CONTRAST TECHNIQUE: Multidetector CT imaging of the chest, abdomen and pelvis was performed following the standard protocol without IV contrast. COMPARISON:  Chest x-ray from earlier in the same day, CT from 02/11/2018 FINDINGS: CT CHEST FINDINGS Cardiovascular: Limited due to lack of IV contrast. Atherosclerotic calcifications of the thoracic aorta are noted without aneurysmal dilatation. No cardiac enlargement is seen. No significant coronary calcifications are noted. Mediastinum/Nodes: Thoracic inlet is within normal limits. The esophagus as visualized is unremarkable. No hilar or mediastinal adenopathy is noted. Lungs/Pleura: Lungs are well aerated bilaterally. Patchy airspace opacities are noted in the lower lobes bilaterally consistent with early infiltrate. Associated small right-sided pleural effusion is noted as well. No sizable parenchymal nodule is seen. No  pneumothorax is noted. Musculoskeletal: Degenerative changes of the thoracic spine are seen. No acute bony abnormality is noted. CT ABDOMEN PELVIS FINDINGS Hepatobiliary: Liver is within normal limits. Gallbladder demonstrates vicarious excretion of contrast material or multiple tiny stones. No complicating factors are seen. Pancreas: Unremarkable. No pancreatic ductal dilatation or surrounding inflammatory changes. Spleen: Large rim calcification is noted centrally within the spleen stable in appearance from the prior exam and dating  back to 2015. Adrenals/Urinary Tract: Adrenal glands are within normal limits. Kidneys demonstrate no obstructive change. Small exophytic cyst is noted from the right kidney stable in appearance from the prior exam. No renal calculi are seen. The bladder is decompressed. Stomach/Bowel: Increased fecal material is noted within the rectal vault consistent with the degree of impaction. The more proximal colon however is decompressed. The appendix is not well visualized although no inflammatory changes to suggest appendicitis are seen. Stomach is well distended with fluid and ingested food stuffs. The small bowel is dilated starting at the duodenum. Fluid-filled dilated loops of jejunum and ileum are seen extending to the level of the ascending colon. No discrete transitional zone is noted. Correlation with the physical exam is recommended as to the bowel sounds as this could represent a high-grade small bowel obstruction or small-bowel ileus. Vascular/Lymphatic: Aortic atherosclerosis. No enlarged abdominal or pelvic lymph nodes. Reproductive: Uterus and bilateral adnexa are unremarkable. Other: No abdominal wall hernia or abnormality. No abdominopelvic ascites. Musculoskeletal: Degenerative changes of the lumbar spine are noted. No compression deformities are seen. IMPRESSION: CT of the chest: Patchy airspace opacities in the bases bilaterally consistent with early multifocal pneumonia. Small right-sided effusion is noted. CT of the abdomen and pelvis: Multiple dilated loops of small bowel extending from the duodenum to the level of the terminal ileum and ascending colon. No discrete lesion is seen. Correlation with the physical exam is recommended as this could represent a high-grade distal obstruction or diffuse small bowel ileus. Vicarious excretion of contrast versus is multiple tiny stones in the gallbladder. No complicating factors are noted. Chronic changes in the spleen and kidneys stable from prior study.  Electronically Signed   By: Inez Catalina M.D.   On: 06/15/2020 19:39     Subjective: Patient was seen and examined at bedside.  She seems at her baseline mental status.   NG tube was removed and she is able to tolerate dysphagia 1 diet.  Patient is alert, able to respond.  Discharge Exam: Vitals:   06/23/20 0727 06/23/20 1133  BP: 109/64 119/67  Pulse: 65 69  Resp: 15 16  Temp: 98.3 F (36.8 C) 98.3 F (36.8 C)  SpO2: 95% 94%   Vitals:   06/23/20 0300 06/23/20 0329 06/23/20 0727 06/23/20 1133  BP:  124/70 109/64 119/67  Pulse: 66 67 65 69  Resp: '15 16 15 16  ' Temp:  97.8 F (36.6 C) 98.3 F (36.8 C) 98.3 F (36.8 C)  TempSrc:  Axillary Axillary Axillary  SpO2: 95% 95% 95% 94%  Weight:  76.4 kg    Height:        General: Pt is alert, awake, not in acute distress Cardiovascular: RRR, S1/S2 +, no rubs, no gallops Respiratory: CTA bilaterally, no wheezing, no rhonchi Abdominal: Soft, NT, ND, bowel sounds + Extremities: no edema, no cyanosis    The results of significant diagnostics from this hospitalization (including imaging, microbiology, ancillary and laboratory) are listed below for reference.     Microbiology: Recent Results (from the past 240 hour(s))  Urine  culture     Status: Abnormal   Collection Time: 06/15/20  2:32 PM   Specimen: Urine, Random  Result Value Ref Range Status   Specimen Description URINE, RANDOM  Final   Special Requests   Final    NONE Performed at [facility], [place] [address], [place], [place] [address]    Culture MULTIPLE SPECIES PRESENT, SUGGEST RECOLLECTION (A)  Final   Report Status 06/17/2020 FINAL  Final  SARS CORONAVIRUS 2 (TAT 6-24 HRS) Nasopharyngeal Urine, Clean Catch     Status: None   Collection Time: 06/15/20  7:24 PM   Specimen: Urine, Clean Catch; Nasopharyngeal  Result Value Ref Range Status   SARS Coronavirus 2 NEGATIVE NEGATIVE Final    Comment: (NOTE) SARS-CoV-2 target nucleic acids are NOT  DETECTED.  The SARS-CoV-2 RNA is generally detectable in upper and lower respiratory specimens during the acute phase of infection. Negative results do not preclude SARS-CoV-2 infection, do not rule out co-infections with other pathogens, and should not be used as the sole basis for treatment or other patient management decisions. Negative results must be combined with clinical observations, patient history, and epidemiological information. The expected result is Negative.  Fact Sheet for Patients: SugarRoll.be  Fact Sheet for Healthcare Providers: https://www.woods-mathews.com/  This test is not yet approved or cleared by the [place] FDA and  has been authorized for detection and/or diagnosis of SARS-CoV-2 by FDA under an Emergency Use Authorization (EUA). This EUA will remain  in effect (meaning this test can be used) for the duration of the COVID-19 declaration under Se ction 564(b)(1) of the Act, 21 U.S.C. section 360bbb-3(b)(1), unless the authorization is terminated or revoked sooner.  Performed at [facility], [place] [address] [place], [place] [address]   MRSA PCR Screening     Status: None   Collection Time: 06/17/20  2:39 PM   Specimen: Nasopharyngeal  Result Value Ref Range Status   MRSA by PCR NEGATIVE NEGATIVE Final    Comment:        The GeneXpert MRSA Assay (FDA approved for NASAL specimens only), is one component of a comprehensive MRSA colonization surveillance program. It is not intended to diagnose MRSA infection nor to guide or monitor treatment for MRSA infections. Performed at [facility], [place] [address] [place], [place] [address]   SARS CORONAVIRUS 2 (TAT 6-24 HRS) Nasopharyngeal Nasopharyngeal Swab     Status: None   Collection Time: 06/22/20  7:37 PM   Specimen: Nasopharyngeal Swab  Result Value Ref Range Status   SARS Coronavirus 2 NEGATIVE NEGATIVE Final    Comment:  (NOTE) SARS-CoV-2 target nucleic acids are NOT DETECTED.  The SARS-CoV-2 RNA is generally detectable in upper and lower respiratory specimens during the acute phase of infection. Negative results do not preclude SARS-CoV-2 infection, do not rule out co-infections with other pathogens, and should not be used as the sole basis for treatment or other patient management decisions. Negative results must be combined with clinical observations, patient history, and epidemiological information. The expected result is Negative.  Fact Sheet for Patients: SugarRoll.be  Fact Sheet for Healthcare Providers: https://www.woods-mathews.com/  This test is not yet approved or cleared by the [place] FDA and  has been authorized for detection and/or diagnosis of SARS-CoV-2 by FDA under an Emergency Use Authorization (EUA). This EUA will remain  in effect (meaning this test can be used) for the duration of the COVID-19 declaration under Se ction 564(b)(1) of the Act,  21 U.S.C. section 360bbb-3(b)(1), unless the authorization is terminated or revoked sooner.  Performed at [facility], [place] [address] [place], [place] [address]      Labs: BNP (last 3 results) Recent Labs    06/16/20 2330  BNP 948.5*   Basic Metabolic Panel: Recent Labs  Lab 06/16/20 2312 06/17/20 1731 06/18/20 0806 06/19/20 0015 06/20/20 0945 06/21/20 0150 06/22/20 0021 06/23/20 0044  NA 144   < > 147* 149* 144 141 141 141  K 3.4*   < > 3.6 3.5 3.0* 3.0* 3.0* 3.2*  CL 113*   < > 118* 117* 114* 112* 111 113*  CO2 21*   < > 19* 23 21* 21* 22 23  GLUCOSE 109*   < > 88 83 137* 139* 107* 93  BUN 49*   < > 27* '19 14 10 10 12  ' CREATININE 1.78*   < > 1.03* 1.03* 1.00 0.93 0.94 0.99  CALCIUM 7.4*   < > 8.2* 8.5* 8.4* 8.4* 8.5* 8.4*  MG 1.9  --  2.5*  --   --  2.2  --  2.2  PHOS  --   --  1.7*  --   --   --   --  3.3   < > = values in this interval not displayed.    Liver Function Tests: Recent Labs  Lab 06/16/20 1836 06/16/20 2312 06/17/20 1731 06/18/20 0806  AST 14* '18 21 20  ' ALT '7 14 14 14  ' ALKPHOS 24* 59 66 70  BILITOT 0.5 1.2 1.0 0.8  PROT <3.0* 5.4* 5.6* 5.6*  ALBUMIN <1.0* 2.5* 2.5* 2.4*   No results for input(s): LIPASE, AMYLASE in the last 168 hours. No results for input(s): AMMONIA in the last 168 hours. CBC: Recent Labs  Lab 06/18/20 0806 06/19/20 0015 06/21/20 0150 06/22/20 0021 06/23/20 0044  WBC 13.6* 13.1* 18.5* 16.2* 14.7*  HGB 13.3 13.4 12.5 12.1 11.5*  HCT 42.4 42.2 37.8 37.4 34.9*  MCV 85.0 81.3 81.5 79.9* 80.2  PLT 134* 167 160 187 195   Cardiac Enzymes: No results for input(s): CKTOTAL, CKMB, CKMBINDEX, TROPONINI in the last 168 hours. BNP: Invalid input(s): POCBNP CBG: No results for input(s): GLUCAP in the last 168 hours. D-Dimer No results for input(s): DDIMER in the last 72 hours. Hgb A1c No results for input(s): HGBA1C in the last 72 hours. Lipid Profile No results for input(s): CHOL, HDL, LDLCALC, TRIG, CHOLHDL, LDLDIRECT in the last 72 hours. Thyroid function studies No results for input(s): TSH, T4TOTAL, T3FREE, THYROIDAB in the last 72 hours.  Invalid input(s): FREET3 Anemia work up No results for input(s): VITAMINB12, FOLATE, FERRITIN, TIBC, IRON, RETICCTPCT in the last 72 hours. Urinalysis    Component Value Date/Time   COLORURINE AMBER (A) 06/15/2020 1927   APPEARANCEUR CLOUDY (A) 06/15/2020 1927   LABSPEC 1.027 06/15/2020 1927   PHURINE 5.0 06/15/2020 1927   GLUCOSEU NEGATIVE 06/15/2020 1927   HGBUR NEGATIVE 06/15/2020 1927   BILIRUBINUR NEGATIVE 06/15/2020 1927   KETONESUR 5 (A) 06/15/2020 1927   PROTEINUR 30 (A) 06/15/2020 1927   UROBILINOGEN 1.0 09/02/2013 1132   NITRITE NEGATIVE 06/15/2020 1927   LEUKOCYTESUR NEGATIVE 06/15/2020 1927   Sepsis Labs Invalid input(s): PROCALCITONIN,  WBC,  LACTICIDVEN Microbiology Recent Results (from the past 240 hour(s))  Urine culture      Status: Abnormal   Collection Time: 06/15/20  2:32 PM   Specimen: Urine, Random  Result Value Ref Range Status   Specimen Description URINE, RANDOM  Final   Special Requests   Final    NONE Performed at [facility], [place] [address], [place], [place] [address]    Culture MULTIPLE SPECIES PRESENT, SUGGEST RECOLLECTION (A)  Final   Report Status 06/17/2020 FINAL  Final  SARS CORONAVIRUS 2 (TAT 6-24 HRS) Nasopharyngeal Urine, Clean Catch     Status: None   Collection Time: 06/15/20  7:24 PM   Specimen: Urine, Clean Catch; Nasopharyngeal  Result Value Ref Range Status   SARS Coronavirus 2 NEGATIVE NEGATIVE Final    Comment: (NOTE) SARS-CoV-2 target nucleic acids are NOT DETECTED.  The SARS-CoV-2 RNA is generally detectable in upper and lower respiratory specimens during the acute phase of infection. Negative results do not preclude SARS-CoV-2 infection, do not rule out co-infections with other pathogens, and should not be used as the sole basis for treatment or other patient management decisions. Negative results must be combined with clinical observations, patient history, and epidemiological information. The expected result is Negative.  Fact Sheet for Patients: SugarRoll.be  Fact Sheet for Healthcare Providers: https://www.woods-mathews.com/  This test is not yet approved or cleared by the [place] FDA and  has been authorized for detection and/or diagnosis of SARS-CoV-2 by FDA under an Emergency Use Authorization (EUA). This EUA will remain  in effect (meaning this test can be used) for the duration of the COVID-19 declaration under Se ction 564(b)(1) of the Act, 21 U.S.C. section 360bbb-3(b)(1), unless the authorization is terminated or revoked sooner.  Performed at [facility], [place] [address], [place], [place] [address]   MRSA PCR Screening     Status: None   Collection Time: 06/17/20  2:39 PM   Specimen:  Nasopharyngeal  Result Value Ref Range Status   MRSA by PCR NEGATIVE NEGATIVE Final    Comment:        The GeneXpert MRSA Assay (FDA approved for NASAL specimens only), is one component of a comprehensive MRSA colonization surveillance program. It is not intended to diagnose MRSA infection nor to guide or monitor treatment for MRSA infections. Performed at [facility], [place] [address], [place], [place] [address]   SARS CORONAVIRUS 2 (TAT 6-24 HRS) Nasopharyngeal Nasopharyngeal Swab     Status: None   Collection Time: 06/22/20  7:37 PM   Specimen: Nasopharyngeal Swab  Result Value Ref Range Status   SARS Coronavirus 2 NEGATIVE NEGATIVE Final    Comment: (NOTE) SARS-CoV-2 target nucleic acids are NOT DETECTED.  The SARS-CoV-2 RNA is generally detectable in upper and lower respiratory specimens during the acute phase of infection. Negative results do not preclude SARS-CoV-2 infection, do not rule out co-infections with other pathogens, and should not be used as the sole basis for treatment or other patient management decisions. Negative results must be combined with clinical observations, patient history, and epidemiological information. The expected result is Negative.  Fact Sheet for Patients: SugarRoll.be  Fact Sheet for Healthcare Providers: https://www.woods-mathews.com/  This test is not yet approved or cleared by the [place] FDA and  has been authorized for detection and/or diagnosis of SARS-CoV-2 by FDA under an Emergency Use Authorization (EUA). This EUA will remain  in effect (meaning this test can be used) for the duration of the COVID-19 declaration under Se ction 564(b)(1) of the Act, 21 U.S.C. section 360bbb-3(b)(1), unless the authorization is terminated or revoked sooner.  Performed at [facility], [place] [address] [place], [place] [address]      Time coordinating discharge:  Over 30  minutes  SIGNED:   Shawna Clamp, MD  [facility] 06/23/2020, 12:13 PM Pager   If 7PM-7AM, please contact night-coverage [email or web address]

## 2020-06-23 NOTE — Discharge Instructions (Signed)
Patient is being discharged to skilled nursing facility for rehab with hospice following. Advised to follow-up with PCP in 1 week.

## 2020-06-23 NOTE — TOC Transition Note (Signed)
Transition of Care [facility]) - CM/SW Discharge Note   Patient Details  Name: [name] MRN: [id number] Date of Birth: 12-27-1940  [facility] of [facility] CM/SW Contact:  Lynett Grimes Phone Number: 06/23/2020, 12:05 PM   Clinical Narrative:    [facility] Anticipated DC date: 06/23/2020 Family notified: Pt daughter Transport by: Sharin Mons   Per MD patient ready for DC to [facility] room 311 . RN to call report prior to discharge [phone]). RN, patient, patient's family, and facility notified of DC. Discharge Summary and FL2 sent to facility. DC packet on chart. Ambulance transport requested for patient.   CSW will sign off for now as social work intervention is no longer needed. Please consult [place] again if new needs arise.        Barriers to Discharge: Continued Medical Work up,Transportation,SNF Pending discharge orders   Patient Goals and CMS Choice Patient states their goals for this hospitalization and ongoing recovery are:: Unable to participate in goal setting due to disorientation. CMS Medicare.gov Compare Post Acute Care list provided to:: Patient Represenative (must comment) (Daughter, [name]) Choice offered to / list presented to : Adult Children  Discharge Placement                       Discharge Plan and Services     Post Acute Care Choice: Skilled Nursing Facility                               Social Determinants of Health (SDOH) Interventions     Readmission Risk Interventions No flowsheet data found.

## 2020-06-23 NOTE — [place] FL2 (Signed)
[place] MEDICAID FL2 LEVEL OF CARE SCREENING TOOL     IDENTIFICATION  Patient Name: [name] Birthdate: 1940/11/16 Sex: female Admission Date (Current Location): 06/15/2020  [facility] and [place] Number:  Producer, television/film/video and Address:  [facility] [place], [address] [place], [place] [address]      Provider Number: [id number]  Attending Physician Name and Address:  Cipriano Bunker, MD  Relative Name and Phone Number:  Tasia Catchings (Daughter)   [phone]    Current Level of Care: SNF Recommended Level of Care: Skilled Nursing Facility Prior Approval Number:    Date Approved/Denied:   PASRR Number: [id number]  Discharge Plan: SNF    Current Diagnoses: Patient Active Problem List   Diagnosis Date Noted  . Malnutrition of moderate degree 06/22/2020  . SBO (small bowel obstruction) (HCC) 06/16/2020  . Dementia (HCC) 06/16/2020  . Acute metabolic encephalopathy 06/16/2020  . Aspiration pneumonia (HCC) 06/16/2020  . Right leg DVT (HCC) 06/16/2020  . DNR (do not resuscitate) 06/16/2020  . Hypomagnesemia   . Colon distention   . Fecal impaction (HCC)   . Acute hypoxemic respiratory failure (HCC)   . Pressure injury of skin 02/04/2018  . Difficult intravenous access   . Constipation 01/27/2018  . Dementia due to Parkinson's disease with behavioral disturbance (HCC) 01/27/2018  . Hypokalemia 01/27/2018  . AKI (acute kidney injury) (HCC) 01/27/2018  . Leukocytosis 01/27/2018  . Acute respiratory failure with hypoxia (HCC) 01/27/2018  . Hyperbilirubinemia 01/27/2018  . Abdominal distention 01/27/2018  . Lower extremity weakness 09/03/2013  . Ambulatory dysfunction 09/03/2013  . Parkinson disease (HCC) 09/03/2013  . SVT (supraventricular tachycardia) (HCC) 03/23/2012  . Preop cardiovascular exam 07/03/2011  . Chest pain 04/26/2011  . Paroxysmal atrial fibrillation (HCC)   . Diastolic CHF (HCC)   . EDEMA 11/02/2008  . IMPAIRED  GLUCOSE TOLERANCE 10/15/2008  . HYPERCHOLESTEROLEMIA 10/15/2008  . OBESITY 10/15/2008  . ANXIETY DISORDER 10/15/2008  . Essential hypertension 10/15/2008  . Coronary atherosclerosis 10/15/2008  . GASTRITIS 10/15/2008  . OSTEOARTHRITIS 10/15/2008    Orientation RESPIRATION BLADDER Height & Weight     Self  Normal Incontinent,External catheter Weight: 168 lb 6.9 oz (76.4 kg) Height:  5\' 2"  (157.5 cm)  BEHAVIORAL SYMPTOMS/MOOD NEUROLOGICAL BOWEL NUTRITION STATUS      Continent Diet (See DC Summary)  AMBULATORY STATUS COMMUNICATION OF NEEDS Skin   Extensive Assist Verbally (Slurred) Normal                       Personal Care Assistance Level of Assistance  Bathing,Feeding,Dressing Bathing Assistance: Maximum assistance Feeding assistance: Limited assistance Dressing Assistance: Maximum assistance     Functional Limitations Info  Sight,Hearing,Speech 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 acetaminophen (TYLENOL) tablet 650 mg  650 mg Oral Q6H PRN 06/25/2020 A, MD       Or  . acetaminophen (TYLENOL) suppository 650 mg  650 mg Rectal  Q6H PRN Madelyn Flavors A, MD      . albuterol (PROVENTIL) (2.5 MG/3ML) 0.083% nebulizer solution 2.5 mg  2.5 mg Nebulization Q6H PRN Katrinka Blazing, Rondell A, MD      . amiodarone (PACERONE) tablet 200 mg  200 mg Oral BID Leatha Gilding, MD   200 mg at 06/23/20 0957  . enoxaparin (LOVENOX) injection 40 mg  40 mg Subcutaneous Q24H Silvana Newness, RPH   40 mg at 06/23/20  0957  . feeding supplement (BOOST / RESOURCE BREEZE) liquid 1 Container  1 Container Oral TID WC Leatha Gilding, MD   1 Container at 06/23/20 1157  . metoprolol tartrate (LOPRESSOR) tablet 12.5 mg  12.5 mg Oral BID Leatha Gilding, MD   12.5 mg at 06/23/20 0957  . potassium chloride (KLOR-CON) packet 40 mEq  40 mEq Oral Daily Gherghe, Costin M, MD      . potassium chloride (KLOR-CON) packet 40 mEq  40 mEq Oral Once Cipriano Bunker, MD         Discharge Medications: Please see discharge summary for a list of discharge medications.  Relevant Imaging Results:  Relevant Lab Results:   Additional Information ssn: [id number]  Ivette Loyal, [place]

## 2020-06-23 NOTE — [place] FL2 (Signed)
[place] MEDICAID FL2 LEVEL OF CARE SCREENING TOOL     IDENTIFICATION  Patient Name: [name] Birthdate: 08-17-1940 Sex: female Admission Date (Current Location): 06/15/2020  [facility] and [place] Number:  Producer, television/film/video and Address:  [facility] [place]. [facility] [address], [place], [place] [address]      Provider Number: [id number]  Attending Physician Name and Address:  Cipriano Bunker, MD  Relative Name and Phone Number:  Tasia Catchings (Daughter)   [phone]    Current Level of Care: SNF Recommended Level of Care: Skilled Nursing Facility Prior Approval Number:    Date Approved/Denied:   PASRR Number: Pending  Discharge Plan: SNF    Current Diagnoses: Patient Active Problem List   Diagnosis Date Noted  . Malnutrition of moderate degree 06/22/2020  . SBO (small bowel obstruction) (HCC) 06/16/2020  . Dementia (HCC) 06/16/2020  . Acute metabolic encephalopathy 06/16/2020  . Aspiration pneumonia (HCC) 06/16/2020  . Right leg DVT (HCC) 06/16/2020  . DNR (do not resuscitate) 06/16/2020  . Hypomagnesemia   . Colon distention   . Fecal impaction (HCC)   . Acute hypoxemic respiratory failure (HCC)   . Pressure injury of skin 02/04/2018  . Difficult intravenous access   . Constipation 01/27/2018  . Dementia due to Parkinson's disease with behavioral disturbance (HCC) 01/27/2018  . Hypokalemia 01/27/2018  . AKI (acute kidney injury) (HCC) 01/27/2018  . Leukocytosis 01/27/2018  . Acute respiratory failure with hypoxia (HCC) 01/27/2018  . Hyperbilirubinemia 01/27/2018  . Abdominal distention 01/27/2018  . Lower extremity weakness 09/03/2013  . Ambulatory dysfunction 09/03/2013  . Parkinson disease (HCC) 09/03/2013  . SVT (supraventricular tachycardia) (HCC) 03/23/2012  . Preop cardiovascular exam 07/03/2011  . Chest pain 04/26/2011  . Paroxysmal atrial fibrillation (HCC)   . Diastolic CHF (HCC)   . EDEMA 11/02/2008  . IMPAIRED GLUCOSE  TOLERANCE 10/15/2008  . HYPERCHOLESTEROLEMIA 10/15/2008  . OBESITY 10/15/2008  . ANXIETY DISORDER 10/15/2008  . Essential hypertension 10/15/2008  . Coronary atherosclerosis 10/15/2008  . GASTRITIS 10/15/2008  . OSTEOARTHRITIS 10/15/2008    Orientation RESPIRATION BLADDER Height & Weight     Self  Normal Incontinent,External catheter Weight: 168 lb 6.9 oz (76.4 kg) Height:  5\' 2"  (157.5 cm)  BEHAVIORAL SYMPTOMS/MOOD NEUROLOGICAL BOWEL NUTRITION STATUS      Continent Diet (See DC Summary)  AMBULATORY STATUS COMMUNICATION OF NEEDS Skin   Extensive Assist Verbally (Slurred) Normal                       Personal Care Assistance Level of Assistance  Bathing,Feeding,Dressing Bathing Assistance: Maximum assistance Feeding assistance: Limited assistance Dressing Assistance: Maximum assistance     Functional Limitations Info  Sight,Hearing,Speech 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 acetaminophen (TYLENOL) tablet 650 mg  650 mg Oral Q6H PRN 06/25/2020 A, MD       Or  . acetaminophen (TYLENOL) suppository 650 mg  650 mg Rectal  Q6H PRN Madelyn Flavors A, MD      . albuterol (PROVENTIL) (2.5 MG/3ML) 0.083% nebulizer solution 2.5 mg  2.5 mg Nebulization Q6H PRN Katrinka Blazing, Rondell A, MD      . amiodarone (PACERONE) tablet 200 mg  200 mg Oral BID Leatha Gilding, MD   200 mg at 06/23/20 0957  . enoxaparin (LOVENOX) injection 40 mg  40 mg Subcutaneous Q24H Silvana Newness, RPH   40 mg at 06/23/20 0957  .  feeding supplement (BOOST / RESOURCE BREEZE) liquid 1 Container  1 Container Oral TID WC Leatha Gilding, MD   1 Container at 06/23/20 1157  . metoprolol tartrate (LOPRESSOR) tablet 12.5 mg  12.5 mg Oral BID Leatha Gilding, MD   12.5 mg at 06/23/20 0957  . potassium chloride (KLOR-CON) packet 40 mEq  40 mEq Oral Daily Gherghe, Costin M, MD      . potassium chloride (KLOR-CON) packet 40 mEq  40 mEq Oral Once Cipriano Bunker, MD         Discharge Medications: Please see discharge summary for a list of discharge medications.  Relevant Imaging Results:  Relevant Lab Results:   Additional Information ssn: [id number]  Ivette Loyal, [place]

## 2021-05-05 DEATH — deceased
